# Patient Record
Sex: Female | Born: 1970 | Race: White | Hispanic: No | Marital: Married | Smoking: Former smoker
Health system: Southern US, Community
[De-identification: ages and names within clinical notes are randomized; demographics above are authoritative.]

## PROBLEM LIST (undated history)

## (undated) DIAGNOSIS — J449 Chronic obstructive pulmonary disease, unspecified: Secondary | ICD-10-CM

## (undated) DIAGNOSIS — I499 Cardiac arrhythmia, unspecified: Secondary | ICD-10-CM

## (undated) DIAGNOSIS — M1990 Unspecified osteoarthritis, unspecified site: Secondary | ICD-10-CM

## (undated) DIAGNOSIS — M51369 Other intervertebral disc degeneration, lumbar region without mention of lumbar back pain or lower extremity pain: Secondary | ICD-10-CM

## (undated) DIAGNOSIS — F319 Bipolar disorder, unspecified: Secondary | ICD-10-CM

## (undated) DIAGNOSIS — R9431 Abnormal electrocardiogram [ECG] [EKG]: Secondary | ICD-10-CM

## (undated) DIAGNOSIS — G894 Chronic pain syndrome: Secondary | ICD-10-CM

## (undated) DIAGNOSIS — I10 Essential (primary) hypertension: Secondary | ICD-10-CM

## (undated) DIAGNOSIS — F32A Depression, unspecified: Secondary | ICD-10-CM

## (undated) DIAGNOSIS — F329 Major depressive disorder, single episode, unspecified: Secondary | ICD-10-CM

## (undated) DIAGNOSIS — E669 Obesity, unspecified: Secondary | ICD-10-CM

## (undated) DIAGNOSIS — R7303 Prediabetes: Secondary | ICD-10-CM

## (undated) DIAGNOSIS — Z96659 Presence of unspecified artificial knee joint: Secondary | ICD-10-CM

## (undated) DIAGNOSIS — F419 Anxiety disorder, unspecified: Secondary | ICD-10-CM

## (undated) DIAGNOSIS — E785 Hyperlipidemia, unspecified: Secondary | ICD-10-CM

## (undated) DIAGNOSIS — M7989 Other specified soft tissue disorders: Secondary | ICD-10-CM

## (undated) DIAGNOSIS — G2581 Restless legs syndrome: Secondary | ICD-10-CM

## (undated) DIAGNOSIS — M797 Fibromyalgia: Secondary | ICD-10-CM

## (undated) DIAGNOSIS — G9520 Unspecified cord compression: Secondary | ICD-10-CM

## (undated) DIAGNOSIS — E876 Hypokalemia: Secondary | ICD-10-CM

## (undated) DIAGNOSIS — R0609 Other forms of dyspnea: Secondary | ICD-10-CM

## (undated) DIAGNOSIS — M978XXA Periprosthetic fracture around other internal prosthetic joint, initial encounter: Secondary | ICD-10-CM

## (undated) DIAGNOSIS — J45909 Unspecified asthma, uncomplicated: Secondary | ICD-10-CM

## (undated) DIAGNOSIS — Z96651 Presence of right artificial knee joint: Secondary | ICD-10-CM

## (undated) DIAGNOSIS — M48061 Spinal stenosis, lumbar region without neurogenic claudication: Secondary | ICD-10-CM

## (undated) DIAGNOSIS — M5414 Radiculopathy, thoracic region: Secondary | ICD-10-CM

## (undated) DIAGNOSIS — M5136 Other intervertebral disc degeneration, lumbar region: Secondary | ICD-10-CM

## (undated) DIAGNOSIS — M5116 Intervertebral disc disorders with radiculopathy, lumbar region: Secondary | ICD-10-CM

## (undated) DIAGNOSIS — M5441 Lumbago with sciatica, right side: Secondary | ICD-10-CM

## (undated) DIAGNOSIS — F1290 Cannabis use, unspecified, uncomplicated: Secondary | ICD-10-CM

## (undated) DIAGNOSIS — R0600 Dyspnea, unspecified: Secondary | ICD-10-CM

## (undated) DIAGNOSIS — M5417 Radiculopathy, lumbosacral region: Secondary | ICD-10-CM

## (undated) DIAGNOSIS — E559 Vitamin D deficiency, unspecified: Secondary | ICD-10-CM

## (undated) DIAGNOSIS — F17200 Nicotine dependence, unspecified, uncomplicated: Secondary | ICD-10-CM

## (undated) DIAGNOSIS — R55 Syncope and collapse: Secondary | ICD-10-CM

## (undated) HISTORY — PX: TUBAL LIGATION: SHX77

## (undated) HISTORY — PX: TONSILLECTOMY: SUR1361

## (undated) HISTORY — PX: ABDOMINAL HYSTERECTOMY: SHX81

## (undated) HISTORY — DX: Radiculopathy, thoracic region: M54.14

## (undated) HISTORY — PX: KNEE ARTHROSCOPY: SUR90

## (undated) HISTORY — PX: OTHER SURGICAL HISTORY: SHX169

## (undated) HISTORY — DX: Radiculopathy, lumbosacral region: M54.17

## (undated) HISTORY — DX: Unspecified cord compression: G95.20

## (undated) HISTORY — DX: Syncope and collapse: R55

## (undated) HISTORY — DX: Presence of right artificial knee joint: Z96.651

## (undated) HISTORY — DX: Lumbago with sciatica, right side: M54.41

## (undated) HISTORY — DX: Periprosthetic fracture around other internal prosthetic joint, initial encounter: M97.8XXA

## (undated) HISTORY — DX: Presence of unspecified artificial knee joint: Z96.659

## (undated) HISTORY — DX: Other specified soft tissue disorders: M79.89

## (undated) HISTORY — DX: Intervertebral disc disorders with radiculopathy, lumbar region: M51.16

---

## 2004-07-09 ENCOUNTER — Ambulatory Visit: Payer: Self-pay | Admitting: Obstetrics and Gynecology

## 2005-07-04 ENCOUNTER — Emergency Department: Payer: Self-pay | Admitting: Internal Medicine

## 2006-03-02 ENCOUNTER — Emergency Department: Payer: Self-pay | Admitting: Emergency Medicine

## 2006-08-18 ENCOUNTER — Emergency Department: Payer: Self-pay | Admitting: Internal Medicine

## 2006-10-23 ENCOUNTER — Emergency Department: Payer: Self-pay | Admitting: Emergency Medicine

## 2006-11-25 ENCOUNTER — Emergency Department: Payer: Self-pay | Admitting: Emergency Medicine

## 2006-12-01 ENCOUNTER — Ambulatory Visit: Payer: Self-pay | Admitting: Specialist

## 2006-12-10 ENCOUNTER — Ambulatory Visit: Payer: Self-pay | Admitting: Specialist

## 2006-12-10 ENCOUNTER — Other Ambulatory Visit: Payer: Self-pay

## 2006-12-17 ENCOUNTER — Ambulatory Visit: Payer: Self-pay | Admitting: Specialist

## 2007-08-14 ENCOUNTER — Emergency Department: Payer: Self-pay | Admitting: Emergency Medicine

## 2007-12-01 ENCOUNTER — Ambulatory Visit: Payer: Self-pay | Admitting: Obstetrics and Gynecology

## 2007-12-16 ENCOUNTER — Ambulatory Visit: Payer: Self-pay | Admitting: Family Medicine

## 2007-12-18 ENCOUNTER — Ambulatory Visit: Payer: Self-pay | Admitting: Obstetrics and Gynecology

## 2008-01-06 ENCOUNTER — Emergency Department: Payer: Self-pay | Admitting: Emergency Medicine

## 2008-02-17 ENCOUNTER — Emergency Department: Payer: Self-pay | Admitting: Emergency Medicine

## 2008-03-17 ENCOUNTER — Emergency Department: Payer: Self-pay | Admitting: Unknown Physician Specialty

## 2008-03-21 ENCOUNTER — Emergency Department: Payer: Self-pay | Admitting: Emergency Medicine

## 2008-07-06 ENCOUNTER — Ambulatory Visit: Payer: Self-pay | Admitting: Pain Medicine

## 2008-07-20 ENCOUNTER — Ambulatory Visit: Payer: Self-pay | Admitting: Pain Medicine

## 2008-08-30 ENCOUNTER — Ambulatory Visit: Payer: Self-pay | Admitting: Obstetrics and Gynecology

## 2008-09-05 ENCOUNTER — Ambulatory Visit: Payer: Self-pay | Admitting: Obstetrics and Gynecology

## 2008-09-28 ENCOUNTER — Ambulatory Visit: Payer: Self-pay | Admitting: Pain Medicine

## 2008-12-19 ENCOUNTER — Ambulatory Visit: Payer: Self-pay | Admitting: Family Medicine

## 2009-03-13 ENCOUNTER — Ambulatory Visit: Payer: Self-pay | Admitting: Unknown Physician Specialty

## 2009-03-16 ENCOUNTER — Inpatient Hospital Stay: Payer: Self-pay | Admitting: Unknown Physician Specialty

## 2010-01-18 ENCOUNTER — Ambulatory Visit: Payer: Self-pay | Admitting: Unknown Physician Specialty

## 2010-01-29 ENCOUNTER — Ambulatory Visit: Payer: Self-pay | Admitting: Unknown Physician Specialty

## 2010-02-08 ENCOUNTER — Ambulatory Visit: Payer: Self-pay | Admitting: Unknown Physician Specialty

## 2012-01-24 DIAGNOSIS — M7989 Other specified soft tissue disorders: Secondary | ICD-10-CM | POA: Insufficient documentation

## 2012-01-24 DIAGNOSIS — M799 Soft tissue disorder, unspecified: Secondary | ICD-10-CM

## 2012-01-24 HISTORY — DX: Soft tissue disorder, unspecified: M79.9

## 2012-03-27 ENCOUNTER — Emergency Department: Payer: Self-pay | Admitting: *Deleted

## 2012-05-07 DIAGNOSIS — M5414 Radiculopathy, thoracic region: Secondary | ICD-10-CM

## 2012-05-07 DIAGNOSIS — G9520 Unspecified cord compression: Secondary | ICD-10-CM | POA: Insufficient documentation

## 2012-05-07 DIAGNOSIS — IMO0002 Reserved for concepts with insufficient information to code with codable children: Secondary | ICD-10-CM | POA: Insufficient documentation

## 2012-05-07 HISTORY — DX: Radiculopathy, thoracic region: M54.14

## 2012-05-07 HISTORY — DX: Unspecified cord compression: G95.20

## 2013-05-08 DIAGNOSIS — F329 Major depressive disorder, single episode, unspecified: Secondary | ICD-10-CM | POA: Insufficient documentation

## 2013-12-30 DIAGNOSIS — M961 Postlaminectomy syndrome, not elsewhere classified: Secondary | ICD-10-CM | POA: Insufficient documentation

## 2013-12-30 DIAGNOSIS — M5412 Radiculopathy, cervical region: Secondary | ICD-10-CM | POA: Insufficient documentation

## 2013-12-30 DIAGNOSIS — G894 Chronic pain syndrome: Secondary | ICD-10-CM | POA: Insufficient documentation

## 2014-03-26 DIAGNOSIS — J45909 Unspecified asthma, uncomplicated: Secondary | ICD-10-CM | POA: Insufficient documentation

## 2014-03-26 DIAGNOSIS — E785 Hyperlipidemia, unspecified: Secondary | ICD-10-CM | POA: Insufficient documentation

## 2014-03-26 DIAGNOSIS — I459 Conduction disorder, unspecified: Secondary | ICD-10-CM | POA: Insufficient documentation

## 2014-03-26 DIAGNOSIS — E7800 Pure hypercholesterolemia, unspecified: Secondary | ICD-10-CM | POA: Insufficient documentation

## 2014-04-08 DIAGNOSIS — R55 Syncope and collapse: Secondary | ICD-10-CM

## 2014-04-08 DIAGNOSIS — I493 Ventricular premature depolarization: Secondary | ICD-10-CM | POA: Insufficient documentation

## 2014-04-08 HISTORY — DX: Syncope and collapse: R55

## 2014-04-20 DIAGNOSIS — R0681 Apnea, not elsewhere classified: Secondary | ICD-10-CM

## 2014-04-20 HISTORY — DX: Apnea, not elsewhere classified: R06.81

## 2014-04-25 DIAGNOSIS — Z87898 Personal history of other specified conditions: Secondary | ICD-10-CM | POA: Insufficient documentation

## 2014-04-25 DIAGNOSIS — F329 Major depressive disorder, single episode, unspecified: Secondary | ICD-10-CM | POA: Insufficient documentation

## 2014-04-25 DIAGNOSIS — E785 Hyperlipidemia, unspecified: Secondary | ICD-10-CM | POA: Insufficient documentation

## 2014-04-25 DIAGNOSIS — F419 Anxiety disorder, unspecified: Secondary | ICD-10-CM

## 2014-04-25 DIAGNOSIS — M545 Low back pain: Secondary | ICD-10-CM

## 2014-04-25 DIAGNOSIS — Z8768 Personal history of other (corrected) conditions arising in the perinatal period: Secondary | ICD-10-CM | POA: Insufficient documentation

## 2014-04-25 DIAGNOSIS — M797 Fibromyalgia: Secondary | ICD-10-CM | POA: Insufficient documentation

## 2014-04-25 DIAGNOSIS — G8929 Other chronic pain: Secondary | ICD-10-CM | POA: Insufficient documentation

## 2014-04-25 DIAGNOSIS — E559 Vitamin D deficiency, unspecified: Secondary | ICD-10-CM | POA: Insufficient documentation

## 2014-04-25 HISTORY — DX: Personal history of other (corrected) conditions arising in the perinatal period: Z87.68

## 2014-04-25 HISTORY — DX: Personal history of other specified conditions: Z87.898

## 2014-05-23 DIAGNOSIS — M5116 Intervertebral disc disorders with radiculopathy, lumbar region: Secondary | ICD-10-CM

## 2014-05-23 DIAGNOSIS — M48061 Spinal stenosis, lumbar region without neurogenic claudication: Secondary | ICD-10-CM | POA: Insufficient documentation

## 2014-05-23 HISTORY — DX: Intervertebral disc disorders with radiculopathy, lumbar region: M51.16

## 2014-06-08 ENCOUNTER — Encounter: Payer: Self-pay | Admitting: Family Medicine

## 2014-08-18 ENCOUNTER — Encounter: Admit: 2014-08-18 | Disposition: A | Discharge status: Home / Self Care | Payer: Self-pay

## 2014-08-19 ENCOUNTER — Encounter: Admit: 2014-08-19 | Disposition: A | Discharge status: Home / Self Care | Payer: Self-pay

## 2014-08-30 DIAGNOSIS — M5441 Lumbago with sciatica, right side: Secondary | ICD-10-CM

## 2014-08-30 DIAGNOSIS — M5136 Other intervertebral disc degeneration, lumbar region: Secondary | ICD-10-CM | POA: Insufficient documentation

## 2014-08-30 HISTORY — DX: Lumbago with sciatica, right side: M54.41

## 2014-11-17 DIAGNOSIS — M159 Polyosteoarthritis, unspecified: Secondary | ICD-10-CM | POA: Insufficient documentation

## 2014-11-17 DIAGNOSIS — M1712 Unilateral primary osteoarthritis, left knee: Secondary | ICD-10-CM | POA: Insufficient documentation

## 2015-01-24 ENCOUNTER — Encounter
Admission: RE | Admit: 2015-01-24 | Discharge: 2015-01-24 | Disposition: A | Discharge status: Home / Self Care | Payer: No Typology Code available for payment source | Source: Ambulatory Visit | Attending: Unknown Physician Specialty | Admitting: Unknown Physician Specialty

## 2015-01-24 DIAGNOSIS — Z01810 Encounter for preprocedural cardiovascular examination: Secondary | ICD-10-CM | POA: Insufficient documentation

## 2015-01-24 DIAGNOSIS — I10 Essential (primary) hypertension: Secondary | ICD-10-CM | POA: Diagnosis not present

## 2015-01-24 DIAGNOSIS — Z01812 Encounter for preprocedural laboratory examination: Secondary | ICD-10-CM | POA: Diagnosis present

## 2015-01-24 HISTORY — DX: Unspecified asthma, uncomplicated: J45.909

## 2015-01-24 HISTORY — DX: Other intervertebral disc degeneration, lumbar region: M51.36

## 2015-01-24 HISTORY — DX: Depression, unspecified: F32.A

## 2015-01-24 HISTORY — DX: Unspecified osteoarthritis, unspecified site: M19.90

## 2015-01-24 HISTORY — DX: Essential (primary) hypertension: I10

## 2015-01-24 HISTORY — DX: Fibromyalgia: M79.7

## 2015-01-24 HISTORY — DX: Anxiety disorder, unspecified: F41.9

## 2015-01-24 HISTORY — DX: Other intervertebral disc degeneration, lumbar region without mention of lumbar back pain or lower extremity pain: M51.369

## 2015-01-24 HISTORY — DX: Restless legs syndrome: G25.81

## 2015-01-24 HISTORY — DX: Major depressive disorder, single episode, unspecified: F32.9

## 2015-01-24 HISTORY — DX: Bipolar disorder, unspecified: F31.9

## 2015-01-24 LAB — PROTIME-INR
INR: 0.92
Prothrombin Time: 12.6 seconds (ref 11.4–15.0)

## 2015-01-24 LAB — POTASSIUM: Potassium: 4 mmol/L (ref 3.5–5.1)

## 2015-01-24 LAB — ABO/RH: ABO/RH(D): AB POS

## 2015-01-24 LAB — APTT: APTT: 29 s (ref 24–36)

## 2015-01-24 LAB — SURGICAL PCR SCREEN
MRSA, PCR: NEGATIVE
STAPHYLOCOCCUS AUREUS: POSITIVE — AB

## 2015-01-24 NOTE — Pre-Procedure Instructions (Signed)
Positive staph results called and faxed to [facility], [place]

## 2015-01-24 NOTE — Patient Instructions (Signed)
  Your procedure is scheduled on: February 01, 2015 (Wednesdday) Report to Day Surgery.[facility]) To find out your arrival time please call [phone] between 1PM - 3PM on January 31, 2015 (Tuesday).  Remember: Instructions that are not followed completely Zacarias result in serious medical risk, up to and including death, or upon the discretion of your surgeon and anesthesiologist your surgery Heffner need to be rescheduled.    __x__ 1. Do not eat food or drink liquids after midnight. No gum chewing or hard candies.     ____ 2. No Alcohol for 24 hours before or after surgery.   ____ 3. Bring all medications with you on the day of surgery if instructed.    __x__ 4. Notify your doctor if there is any change in your medical condition     (cold, fever, infections).     Do not wear jewelry, make-up, hairpins, clips or nail polish.  Do not wear lotions, powders, or perfumes. You Briles wear deodorant.  Do not shave 48 hours prior to surgery. Men Nand shave face and neck.  Do not bring valuables to the hospital.    [facility] is not responsible for any belongings or valuables.               Contacts, dentures or bridgework Felty not be worn into surgery.  Leave your suitcase in the car. After surgery it Stroot be brought to your room.  For patients admitted to the hospital, discharge time is determined by your                treatment team.   Patients discharged the day of surgery will not be allowed to drive home.   Please read over the following fact sheets that you were given:   MRSA Information and Surgical Site Infection Prevention   ____ Take these medicines the morning of surgery with A SIP OF WATER:    1. Gabapentin  2. Venlafaxine XR  3.   4.  5.  6.  ____ Fleet Enema (as directed)   _x___ Use CHG Soap as directed  ____ Use inhalers on the day of surgery  ____ Stop metformin 2 days prior to surgery    ____ Take 1/2 of usual insulin dose the night before surgery and none on  the morning of surgery.   ____ Stop Coumadin/Plavix/aspirin on   _x___ Stop Anti-inflammatories on (Stop Diclofenac now)   ____ Stop supplements until after surgery.    ____ Bring C-Pap to the hospital.

## 2015-02-01 ENCOUNTER — Encounter: Payer: Self-pay | Admitting: *Deleted

## 2015-02-01 ENCOUNTER — Inpatient Hospital Stay: Payer: No Typology Code available for payment source | Admitting: Anesthesiology

## 2015-02-01 ENCOUNTER — Inpatient Hospital Stay: Payer: No Typology Code available for payment source

## 2015-02-01 ENCOUNTER — Encounter
Admission: RE | Disposition: A | Discharge status: Home Health | Payer: Self-pay | Source: Ambulatory Visit | Attending: Unknown Physician Specialty

## 2015-02-01 ENCOUNTER — Inpatient Hospital Stay
Admission: RE | Admit: 2015-02-01 | Discharge: 2015-02-03 | DRG: 470 | Disposition: A | Discharge status: Home Health | Payer: No Typology Code available for payment source | Source: Ambulatory Visit | Attending: Unknown Physician Specialty | Admitting: Unknown Physician Specialty

## 2015-02-01 DIAGNOSIS — J45909 Unspecified asthma, uncomplicated: Secondary | ICD-10-CM | POA: Diagnosis present

## 2015-02-01 DIAGNOSIS — G8918 Other acute postprocedural pain: Secondary | ICD-10-CM

## 2015-02-01 DIAGNOSIS — Z96651 Presence of right artificial knee joint: Secondary | ICD-10-CM

## 2015-02-01 DIAGNOSIS — Z87891 Personal history of nicotine dependence: Secondary | ICD-10-CM | POA: Diagnosis not present

## 2015-02-01 DIAGNOSIS — I10 Essential (primary) hypertension: Secondary | ICD-10-CM | POA: Diagnosis present

## 2015-02-01 DIAGNOSIS — Z6841 Body Mass Index (BMI) 40.0 and over, adult: Secondary | ICD-10-CM | POA: Diagnosis not present

## 2015-02-01 DIAGNOSIS — F419 Anxiety disorder, unspecified: Secondary | ICD-10-CM | POA: Diagnosis present

## 2015-02-01 DIAGNOSIS — M5136 Other intervertebral disc degeneration, lumbar region: Secondary | ICD-10-CM | POA: Diagnosis present

## 2015-02-01 DIAGNOSIS — M179 Osteoarthritis of knee, unspecified: Secondary | ICD-10-CM | POA: Diagnosis present

## 2015-02-01 DIAGNOSIS — M797 Fibromyalgia: Secondary | ICD-10-CM | POA: Diagnosis present

## 2015-02-01 DIAGNOSIS — G2581 Restless legs syndrome: Secondary | ICD-10-CM | POA: Diagnosis present

## 2015-02-01 DIAGNOSIS — F319 Bipolar disorder, unspecified: Secondary | ICD-10-CM | POA: Diagnosis present

## 2015-02-01 HISTORY — DX: Presence of right artificial knee joint: Z96.651

## 2015-02-01 HISTORY — PX: TOTAL KNEE ARTHROPLASTY: SHX125

## 2015-02-01 LAB — CREATININE, SERUM
CREATININE: 1.06 mg/dL — AB (ref 0.44–1.00)
GFR calc Af Amer: >60 mL/min (ref >60)
GFR calc non Af Amer: >60 mL/min (ref >60)

## 2015-02-01 LAB — CBC
HCT: 37.5 % (ref 35.0–47.0)
Hemoglobin: 12.4 g/dL (ref 12.0–16.0)
MCH: 28.2 pg (ref 26.0–34.0)
MCHC: 33 g/dL (ref 32.0–36.0)
MCV: 85.6 fL (ref 80.0–100.0)
PLATELETS: 238 10*3/uL (ref 150–440)
RBC: 4.38 MIL/uL (ref 3.80–5.20)
RDW: 13.6 % (ref 11.5–14.5)
WBC: 17.5 10*3/uL — ABNORMAL HIGH (ref 3.6–11.0)

## 2015-02-01 LAB — PREPARE RBC (CROSSMATCH)

## 2015-02-01 SURGERY — ARTHROPLASTY, KNEE, TOTAL
Anesthesia: Spinal | Laterality: Right

## 2015-02-01 MED ORDER — BUPIVACAINE LIPOSOME 1.3 % IJ SUSP
INTRAMUSCULAR | Status: DC | PRN
  Administered 2015-02-01: 60 mL

## 2015-02-01 MED ORDER — BUPIVACAINE-EPINEPHRINE (PF) 0.5% -1:200000 IJ SOLN
INTRAMUSCULAR | Status: AC
  Filled 2015-02-01: qty 30

## 2015-02-01 MED ORDER — CEFAZOLIN SODIUM-DEXTROSE 2-3 GM-% IV SOLR
2.0000 g | Freq: Once | INTRAVENOUS | Status: AC
  Administered 2015-02-01: 2 g via INTRAVENOUS

## 2015-02-01 MED ORDER — POLYETHYLENE GLYCOL 3350 17 G PO PACK
17.0000 g | PACK | Freq: Every day | ORAL | Status: DC | PRN
  Administered 2015-02-02 – 2015-02-03 (×2): 17 g via ORAL
  Filled 2015-02-01 (×2): qty 1

## 2015-02-01 MED ORDER — BUPIVACAINE LIPOSOME 1.3 % IJ SUSP
INTRAMUSCULAR | Status: AC
  Filled 2015-02-01: qty 20

## 2015-02-01 MED ORDER — BUPIVACAINE-EPINEPHRINE (PF) 0.5% -1:200000 IJ SOLN
INTRAMUSCULAR | Status: DC | PRN
  Administered 2015-02-01: 30 mL

## 2015-02-01 MED ORDER — ACETAMINOPHEN 10 MG/ML IV SOLN
INTRAVENOUS | Status: AC
  Filled 2015-02-01: qty 100

## 2015-02-01 MED ORDER — PROPOFOL INFUSION 10 MG/ML OPTIME
INTRAVENOUS | Status: DC | PRN
  Administered 2015-02-01: 75 ug/kg/min via INTRAVENOUS

## 2015-02-01 MED ORDER — LACTATED RINGERS IV SOLN
INTRAVENOUS | Status: DC
  Administered 2015-02-01 (×2): via INTRAVENOUS

## 2015-02-01 MED ORDER — CEFAZOLIN SODIUM 1-5 GM-% IV SOLN
INTRAVENOUS | Status: DC | PRN
  Administered 2015-02-01: 1 g via INTRAVENOUS

## 2015-02-01 MED ORDER — HYDRALAZINE HCL 20 MG/ML IJ SOLN
INTRAMUSCULAR | Status: DC | PRN
  Administered 2015-02-01 (×2): 5 mg via INTRAVENOUS

## 2015-02-01 MED ORDER — NEOMYCIN-POLYMYXIN B GU 40-200000 IR SOLN
Status: DC | PRN
  Administered 2015-02-01: 16 mL

## 2015-02-01 MED ORDER — ACETAMINOPHEN 325 MG PO TABS: 650.0000 mg | ORAL_TABLET | Freq: Four times a day (QID) | ORAL | Status: DC | PRN

## 2015-02-01 MED ORDER — KCL IN DEXTROSE-NACL 20-5-0.45 MEQ/L-%-% IV SOLN
INTRAVENOUS | Status: DC
  Administered 2015-02-01 – 2015-02-02 (×2): via INTRAVENOUS
  Filled 2015-02-01 (×7): qty 1000

## 2015-02-01 MED ORDER — MIDAZOLAM HCL 2 MG/2ML IJ SOLN
INTRAMUSCULAR | Status: DC | PRN
  Administered 2015-02-01: 2 mg via INTRAVENOUS

## 2015-02-01 MED ORDER — TRANEXAMIC ACID 1000 MG/10ML IV SOLN
INTRAVENOUS | Status: AC
  Filled 2015-02-01: qty 10

## 2015-02-01 MED ORDER — HYDROCHLOROTHIAZIDE 12.5 MG PO CAPS
12.5000 mg | ORAL_CAPSULE | Freq: Every day | ORAL | Status: DC
  Administered 2015-02-02 – 2015-02-03 (×2): 12.5 mg via ORAL
  Filled 2015-02-01 (×2): qty 1

## 2015-02-01 MED ORDER — PHENOL 1.4 % MT LIQD: 1.0000 | OROMUCOSAL | Status: DC | PRN

## 2015-02-01 MED ORDER — SODIUM CHLORIDE 0.9 % IJ SOLN
INTRAMUSCULAR | Status: AC
  Filled 2015-02-01: qty 50

## 2015-02-01 MED ORDER — ACETAMINOPHEN 10 MG/ML IV SOLN
INTRAVENOUS | Status: DC | PRN
  Administered 2015-02-01: 1000 mg via INTRAVENOUS

## 2015-02-01 MED ORDER — GABAPENTIN 600 MG PO TABS
900.0000 mg | ORAL_TABLET | Freq: Two times a day (BID) | ORAL | Status: DC
Start: 2015-02-01 — End: 2015-02-03
  Administered 2015-02-01 – 2015-02-03 (×4): 900 mg via ORAL
  Filled 2015-02-01: qty 1
  Filled 2015-02-01: qty 2
  Filled 2015-02-01: qty 1
  Filled 2015-02-01 (×2): qty 2
  Filled 2015-02-01: qty 1

## 2015-02-01 MED ORDER — EPHEDRINE SULFATE 50 MG/ML IJ SOLN
INTRAMUSCULAR | Status: DC | PRN
  Administered 2015-02-01 (×2): 10 mg via INTRAVENOUS

## 2015-02-01 MED ORDER — HYDROMORPHONE HCL 1 MG/ML IJ SOLN
2.0000 mg | INTRAMUSCULAR | Status: DC | PRN
  Administered 2015-02-01 – 2015-02-02 (×6): 2 mg via INTRAVENOUS
  Filled 2015-02-01 (×6): qty 2

## 2015-02-01 MED ORDER — ACETAMINOPHEN 650 MG RE SUPP: 650.0000 mg | Freq: Four times a day (QID) | RECTAL | Status: DC | PRN

## 2015-02-01 MED ORDER — VENLAFAXINE HCL ER 75 MG PO CP24
225.0000 mg | ORAL_CAPSULE | Freq: Every day | ORAL | Status: DC
  Administered 2015-02-02 – 2015-02-03 (×2): 225 mg via ORAL
  Filled 2015-02-01 (×2): qty 3

## 2015-02-01 MED ORDER — OXYCODONE HCL 5 MG PO TABS
5.0000 mg | ORAL_TABLET | ORAL | Status: DC | PRN
  Administered 2015-02-01: 10 mg via ORAL
  Administered 2015-02-01 (×2): 5 mg via ORAL
  Administered 2015-02-02 – 2015-02-03 (×8): 10 mg via ORAL
  Filled 2015-02-01 (×4): qty 2
  Filled 2015-02-01: qty 1
  Filled 2015-02-01: qty 2
  Filled 2015-02-01: qty 1
  Filled 2015-02-01 (×4): qty 2

## 2015-02-01 MED ORDER — FENTANYL CITRATE (PF) 100 MCG/2ML IJ SOLN
INTRAMUSCULAR | Status: AC
  Filled 2015-02-01: qty 2

## 2015-02-01 MED ORDER — LABETALOL HCL 5 MG/ML IV SOLN
INTRAVENOUS | Status: DC | PRN
  Administered 2015-02-01: 5 mg via INTRAVENOUS

## 2015-02-01 MED ORDER — LIDOCAINE HCL (CARDIAC) 20 MG/ML IV SOLN
INTRAVENOUS | Status: DC | PRN
  Administered 2015-02-01: 80 mg via INTRAVENOUS

## 2015-02-01 MED ORDER — ARIPIPRAZOLE 5 MG PO TABS
10.0000 mg | ORAL_TABLET | Freq: Every day | ORAL | Status: DC
Start: 2015-02-01 — End: 2015-02-03
  Administered 2015-02-01 – 2015-02-03 (×3): 10 mg via ORAL
  Filled 2015-02-01 (×3): qty 2

## 2015-02-01 MED ORDER — CEFAZOLIN SODIUM 1-5 GM-% IV SOLN
1.0000 g | Freq: Three times a day (TID) | INTRAVENOUS | Status: AC
  Administered 2015-02-01 – 2015-02-02 (×4): 1 g via INTRAVENOUS
  Filled 2015-02-01 (×3): qty 50

## 2015-02-01 MED ORDER — ONDANSETRON HCL 4 MG/2ML IJ SOLN: 4.0000 mg | Freq: Once | INTRAMUSCULAR | Status: DC | PRN

## 2015-02-01 MED ORDER — VITAMIN D 1000 UNITS PO TABS
1000.0000 [IU] | ORAL_TABLET | Freq: Every day | ORAL | Status: DC
  Administered 2015-02-02 – 2015-02-03 (×2): 1000 [IU] via ORAL
  Filled 2015-02-01 (×2): qty 1

## 2015-02-01 MED ORDER — MENTHOL 3 MG MT LOZG: 1.0000 | LOZENGE | OROMUCOSAL | Status: DC | PRN

## 2015-02-01 MED ORDER — FAMOTIDINE 20 MG PO TABS
ORAL_TABLET | ORAL | Status: AC
  Administered 2015-02-01: 20 mg via ORAL
  Filled 2015-02-01: qty 1

## 2015-02-01 MED ORDER — ONDANSETRON HCL 4 MG PO TABS: 4.0000 mg | ORAL_TABLET | Freq: Four times a day (QID) | ORAL | Status: DC | PRN

## 2015-02-01 MED ORDER — ENOXAPARIN SODIUM 30 MG/0.3ML ~~LOC~~ SOLN: 30.0000 mg | Freq: Two times a day (BID) | SUBCUTANEOUS | Status: DC

## 2015-02-01 MED ORDER — HYDROXYZINE HCL 50 MG PO TABS
50.0000 mg | ORAL_TABLET | Freq: Three times a day (TID) | ORAL | Status: DC
  Administered 2015-02-01 – 2015-02-03 (×6): 50 mg via ORAL
  Filled 2015-02-01 (×7): qty 1

## 2015-02-01 MED ORDER — TRANEXAMIC ACID 1000 MG/10ML IV SOLN
1000.0000 mg | INTRAVENOUS | Status: DC | PRN
  Administered 2015-02-01: 1000 mg via TOPICAL

## 2015-02-01 MED ORDER — FAMOTIDINE 20 MG PO TABS
20.0000 mg | ORAL_TABLET | Freq: Once | ORAL | Status: AC
  Administered 2015-02-01: 20 mg via ORAL

## 2015-02-01 MED ORDER — NEOMYCIN-POLYMYXIN B GU 40-200000 IR SOLN
Status: AC
  Filled 2015-02-01: qty 20

## 2015-02-01 MED ORDER — FENTANYL CITRATE (PF) 100 MCG/2ML IJ SOLN
25.0000 ug | INTRAMUSCULAR | Status: DC | PRN
  Administered 2015-02-01 (×5): 25 ug via INTRAVENOUS

## 2015-02-01 MED ORDER — SIMVASTATIN 20 MG PO TABS
20.0000 mg | ORAL_TABLET | Freq: Every day | ORAL | Status: DC
  Administered 2015-02-02: 20 mg via ORAL
  Filled 2015-02-01: qty 1

## 2015-02-01 MED ORDER — TIZANIDINE HCL 4 MG PO TABS
4.0000 mg | ORAL_TABLET | Freq: Three times a day (TID) | ORAL | Status: DC
  Administered 2015-02-01 – 2015-02-03 (×5): 4 mg via ORAL
  Filled 2015-02-01 (×5): qty 1

## 2015-02-01 MED ORDER — FENTANYL CITRATE (PF) 100 MCG/2ML IJ SOLN
INTRAMUSCULAR | Status: AC
  Administered 2015-02-01: 25 ug via INTRAVENOUS
  Filled 2015-02-01: qty 2

## 2015-02-01 MED ORDER — FENTANYL CITRATE (PF) 100 MCG/2ML IJ SOLN
INTRAMUSCULAR | Status: DC | PRN
  Administered 2015-02-01 (×2): 50 ug via INTRAVENOUS

## 2015-02-01 MED ORDER — CEFAZOLIN SODIUM-DEXTROSE 2-3 GM-% IV SOLR
INTRAVENOUS | Status: AC
  Filled 2015-02-01: qty 50

## 2015-02-01 MED ORDER — ONDANSETRON HCL 4 MG/2ML IJ SOLN
4.0000 mg | Freq: Four times a day (QID) | INTRAMUSCULAR | Status: DC | PRN
  Administered 2015-02-02: 4 mg via INTRAVENOUS
  Filled 2015-02-01 (×2): qty 2

## 2015-02-01 SURGICAL SUPPLY — 65 items
BEARIN INSERT TIBIAL SZ4 9 (Orthopedic Implant) IMPLANT
BEARIN INSERT TIBIAL SZ4 9MM (Orthopedic Implant) IMPLANT
BEARIN TIBIAL TRIATH (Orthopedic Implant) IMPLANT
BEARING INSERT TIBIAL SZ4 9 (Orthopedic Implant) IMPLANT
BEARING TIBIAL TRIATH (Orthopedic Implant) IMPLANT
BLADE SAGITTAL AGGR TOOTH XLG (BLADE) ×3 IMPLANT
BLADE SAW 1/2 (BLADE) ×3 IMPLANT
BLADE SAW SAG 29X58X.64 (BLADE) ×3 IMPLANT
BLADE SURG 15 STRL LF DISP TIS (BLADE) IMPLANT
BLADE SURG 15 STRL SS (BLADE)
BNDG COHESIVE 6X5 TAN STRL LF (GAUZE/BANDAGES/DRESSINGS) ×3 IMPLANT
BOWL CEMENT MIXING ADV NOZZLE (MISCELLANEOUS) IMPLANT
CANISTER SUCT 1200ML W/VALVE (MISCELLANEOUS) ×3 IMPLANT
CANISTER SUCT 3000ML (MISCELLANEOUS) ×6 IMPLANT
CAPT KNEE TRIATH TK-4 ×3 IMPLANT
CATH TRAY METER 16FR LF (MISCELLANEOUS) ×3 IMPLANT
CHLORAPREP W/TINT 26ML (MISCELLANEOUS) ×9 IMPLANT
COOLER POLAR GLACIER W/PUMP (MISCELLANEOUS) ×3 IMPLANT
DRAPE INCISE IOBAN 66X45 STRL (DRAPES) ×3 IMPLANT
DRAPE SHEET LG 3/4 BI-LAMINATE (DRAPES) ×6 IMPLANT
DRAPE TABLE BACK 80X90 (DRAPES) ×3 IMPLANT
DRSG AQUACEL AG ADV 3.5X10 (GAUZE/BANDAGES/DRESSINGS) IMPLANT
DRSG OPSITE POSTOP 4X10 (GAUZE/BANDAGES/DRESSINGS) ×3 IMPLANT
GAUZE PETRO XEROFOAM 1X8 (MISCELLANEOUS) ×3 IMPLANT
GAUZE SPONGE 4X4 12PLY STRL (GAUZE/BANDAGES/DRESSINGS) IMPLANT
GLOVE BIO SURGEON STRL SZ7.5 (GLOVE) ×6 IMPLANT
GLOVE BIO SURGEON STRL SZ8 (GLOVE) ×12 IMPLANT
GLOVE BIOGEL M STRL SZ7.5 (GLOVE) ×3 IMPLANT
GLOVE INDICATOR 8.0 STRL GRN (GLOVE) ×3 IMPLANT
GOWN STRL REUS W/ TWL LRG LVL3 (GOWN DISPOSABLE) ×2 IMPLANT
GOWN STRL REUS W/TWL LRG LVL3 (GOWN DISPOSABLE) ×4
GOWN STRL REUS W/TWL LRG LVL4 (GOWN DISPOSABLE) ×6 IMPLANT
HANDPIECE SUCTION TUBG SURGILV (MISCELLANEOUS) IMPLANT
HOOD PEEL AWAY FACE SHEILD DIS (HOOD) ×6 IMPLANT
IRRIGATION STRYKERFLOW (MISCELLANEOUS) ×1 IMPLANT
IRRIGATOR STRYKERFLOW (MISCELLANEOUS) ×3
KIT RM TURNOVER STRD PROC AR (KITS) ×3 IMPLANT
NDL SAFETY 18GX1.5 (NEEDLE) ×3 IMPLANT
NDL SAFETY 22GX1.5 (NEEDLE) IMPLANT
NEEDLE SPNL 18GX3.5 QUINCKE PK (NEEDLE) ×3 IMPLANT
NEEDLE SPNL 20GX3.5 QUINCKE YW (NEEDLE) ×6 IMPLANT
NS IRRIG 1000ML POUR BTL (IV SOLUTION) ×3 IMPLANT
PACK TOTAL KNEE (MISCELLANEOUS) ×3 IMPLANT
PAD ABD DERMACEA PRESS 5X9 (GAUZE/BANDAGES/DRESSINGS) IMPLANT
PAD GROUND ADULT SPLIT (MISCELLANEOUS) ×3 IMPLANT
PAD WRAPON POLAR KNEE (MISCELLANEOUS) ×1 IMPLANT
SOL .9 NS 3000ML IRR  AL (IV SOLUTION) ×2
SOL .9 NS 3000ML IRR UROMATIC (IV SOLUTION) ×1 IMPLANT
SOL PREP PVP 2OZ (MISCELLANEOUS) ×3
SOLUTION PREP PVP 2OZ (MISCELLANEOUS) ×1 IMPLANT
STAPLER SKIN PROX 35W (STAPLE) ×3 IMPLANT
SUCTION FRAZIER TIP 10 FR DISP (SUCTIONS) ×3 IMPLANT
SUT ETHIBOND CT1 BRD #0 30IN (SUTURE) ×3 IMPLANT
SUT ETHIBOND NAB CT1 #1 30IN (SUTURE) ×6 IMPLANT
SUT VIC AB 0 CT1 36 (SUTURE) ×9 IMPLANT
SUT VIC AB 2-0 CT1 27 (SUTURE) ×4
SUT VIC AB 2-0 CT1 TAPERPNT 27 (SUTURE) ×2 IMPLANT
SYR 20CC LL (SYRINGE) ×3 IMPLANT
SYR 30ML LL (SYRINGE) ×3 IMPLANT
SYR 50ML LL SCALE MARK (SYRINGE) ×3 IMPLANT
SYRINGE 10CC LL (SYRINGE) ×3 IMPLANT
Triathion X3 tibiaal bearing insert PS (Knees) IMPLANT
WATER STERILE IRR 1000ML POUR (IV SOLUTION) ×3 IMPLANT
WRAPON POLAR PAD KNEE (MISCELLANEOUS) ×3
triathlon X3 tibial bearing insert (Knees) IMPLANT

## 2015-02-01 NOTE — Op Note (Signed)
DATE OF SURGERY:  02/01/2015  PATIENT NAME:  Shelly Wright   DOB: 08/15/1970  MRN: [id number]  PRE-OPERATIVE DIAGNOSIS: Degenerative arthrosis of the right knee  POST-OPERATIVE DIAGNOSIS:  Degenerative arthrosis of the right knee  PROCEDURE:  Right total knee arthroplasty  SURGEON:  Dr.Jaryd Drew Royann Shivers. M.D.  ASSISTANT: Dedra Skeens, PA-C     ANESTHESIA: Spinal  ESTIMATED BLOOD LOSS: 400 mL  TOURNIQUET TIME: 116 minutes   IMPLANTS UTILIZED: Triathlon #5 PS femoral component, #4 tibial component, #4 11 mm thick tibial bearing insert, 10 mm thick asymmetric patella  INDICATIONS FOR SURGERY: Shelly Wright is a 44 y.o. year old female with a long history of progressive right knee pain. X-rays demonstrated severe degenerative changes in tricompartmental fashion. The patient had not seen any significant improvement despite conservative nonsurgical intervention. After discussion of the risks and benefits of surgical intervention, the patient expressed understanding of the risks benefits and agree with plans for total knee arthroplasty.   The risks, benefits, and alternatives were discussed at length including but not limited to the risks of infection, bleeding, nerve injury, stiffness, blood clots, the need for revision surgery, cardiopulmonary complications, among others, and they were willing to proceed.  PROCEDURE:   The patient was taken to the operating room where satisfactory spinal anesthesia was achieved. A Foley catheter was inserted. The patient was given 2 g of IV Kefzol prior to start of the procedure. A tourniquet was applied to right upper thigh. The right lower extremity was prepped and draped in the usual fashion for a total knee procedure. The right lower extremity was then exsanguinated, and the tourniquet was inflated. A medial parapatella incision was made.. Dissection was carried down through the subcutaneous tissue onto the capsule. This was divided in line with the  incision. The knee was then inspected. There was significant erosion of all 3 compartments with significant peripheral osteophytes about the distal femur.  I went ahead and drilled a hole in the intercondylar notch. Intramedullary rod was inserted. On the rod was the distal femoral cutting block. It was set for  5 degree valgus cut. The cutting block was fixed to the distal femur with smooth pins and then the intramedullary road was removed. The distal femoral cut was made, removing 12 mm of bone. I then went ahead and removed some medial and lateral compartment meniscal remnants. ACL and PCL were excised. The knee was hyperflexed. The femoral sizing guide was placed on the distal femur and was lined up with the epicondylar axis. It was felt that a #5 femoral component was going to be the appropriate size. I went ahead and impacted the 4 in 1 cutting guide onto the distal femur. Anterior and posterior cuts were made followed by the anterior and posterior chamfer cuts. The intercondylar area was notched out using the appropriate guide.   External alignment jig was placed on the right leg and set for a cut 9 mm below the medial tibial plateau. The cut was sloped 3 degrees. I went ahead and made this cut without difficulty. I removed the cut bone from the proximal tibial which gave me a flush cut across the tibial surface. I then inserted a  gap spacer with the knee extended, and indeed it was felt that I could easily fit a  9 mm tibial bearing insert into the extension space.  I then sized the proximal tibia for a  #4 base plate. With the #5 femoral component in place and the #4  base plate in place, I was able to insert a 9 mm trial tibial-bearing insert. With all the trials in place, the knee fully extended and flexed well.. I then  resected about 10 mm of bone from retropatellar surface. I made peg holes for the 10 mm patellar implant. Trial patella was inserted. It tracked well.   The trials were removed. The  tibial base plate was positioned appropriately for the proximal tibial cruciate cut. The proximal tibial cruciate cut ws made Next, I went ahead and drilled 2 holes through the femoral trial for the pegs on the femoral component.    I then sequentially impacted the components. The #4 porous-coated  tibial base plate was impacted onto the proximal tibia. Excess glue ws removed. I then impacted the #5 porous-coated femoral component onto the distal.  I then inserted a 9 mm polyethylene spacer. The knee was extended.  I went ahead and applied an 10 mm mm thick porous-coated asymmetric patella to the retropatellar surface. removed.  The knee came to full extension and flexed well. The knee seemed to be somewhat unstable in flexion, however. Consequently I went ahead and removed the 9 mm polyethylene tibial bearing insert and inserted an 11 mm #4 tibial bearing insert onto the tibial baseplate which gave the knee more stability.   Tourniquet was released at this time.  Tourniquet was up for about 116 minutes.  Bleeding was controlled with coagulation cautery. The wound was irrigated with GU irrigant.  I also injected the capsule and subcutaneous tissue with about 50 cc of Exparel and saline mixture. I also injected the same tissues with about 50 cc of 0.25% Marcaine. I went ahead and closed the capsule with #0 ethibond sutures. I then injected 10 cc of TXA through the capsule into the knee joint. Subcutaneous tissue was closed with with  2-0 Vicryl, and skin with skin staples. Subsequent to the closure, I did go ahead and apply a sterile dressing and 4 TENS pads. Polar Care cooling pad was applied.  The patient was then transferred to a stretcher bed and taken to the recovery room in satisfactory condition. Blood loss was about 400 cc.        Dr.Wacey Zieger Royann Shivers. M.D.

## 2015-02-01 NOTE — Transfer of Care (Signed)
Immediate Anesthesia Transfer of Care Note  Patient: Shelly Wright  Procedure(s) Performed: Procedure(s): TOTAL KNEE ARTHROPLASTY (Right)  Patient Location: PACU  Anesthesia Type:Spinal  Level of Consciousness: awake, alert  and oriented  Airway & Oxygen Therapy: Patient Spontanous Breathing and Patient connected to face mask oxygen  Post-op Assessment: Report given to RN and Post -op Vital signs reviewed and stable  Post vital signs: Reviewed and stable  Last Vitals:  Filed Vitals:   02/01/15 1202  BP: 113/61  Pulse: 66  Temp: 36.5 C  Resp: 12    Complications: No apparent anesthesia complications

## 2015-02-01 NOTE — Progress Notes (Signed)
Plan of care discussed with patient. Patient would liked to be awaking for pain medication. Plan of care placed on bedside Patient teaching giving to patient use of incentive spirometer with teach back.

## 2015-02-01 NOTE — Anesthesia Preprocedure Evaluation (Signed)
Anesthesia Evaluation  Patient identified by MRN, date of birth, ID band  Reviewed: Allergy & Precautions, NPO status , Patient's Chart, lab work & pertinent test results  History of Anesthesia Complications Negative for: history of anesthetic complications  Airway Mallampati: II  TM Distance: >3 FB Neck ROM: Full    Dental  (+) Missing   Pulmonary asthma (remote hx) , former smoker (quit 1 1/2 yrs),           Cardiovascular hypertension, Pt. on medications      Neuro/Psych Anxiety Depression Bipolar Disorder    GI/Hepatic   Endo/Other  Morbid obesity  Renal/GU      Musculoskeletal  (+) Arthritis , Osteoarthritis,  Fibromyalgia -  Abdominal   Peds  Hematology   Anesthesia Other Findings   Reproductive/Obstetrics                             Anesthesia Physical Anesthesia Plan  ASA: III  Anesthesia Plan: Spinal   Post-op Pain Management:    Induction: Intravenous  Airway Management Planned:   Additional Equipment:   Intra-op Plan:   Post-operative Plan:   Informed Consent: I have reviewed the patients History and Physical, chart, labs and discussed the procedure including the risks, benefits and alternatives for the proposed anesthesia with the patient or authorized representative who has indicated his/her understanding and acceptance.     Plan Discussed with:   Anesthesia Plan Comments:         Anesthesia Quick Evaluation

## 2015-02-01 NOTE — Anesthesia Procedure Notes (Signed)
Spinal Patient location during procedure: OR Start time: 02/01/2015 7:43 AM Staffing Anesthesiologist: Naomie Dean Resident/CRNA: Chong Sicilian Performed by: resident/CRNA  Preanesthetic Checklist Completed: patient identified, site marked, surgical consent, pre-op evaluation, timeout performed, IV checked, risks and benefits discussed and monitors and equipment checked Spinal Block Patient position: sitting Prep: Betadine Patient monitoring: heart rate, continuous pulse ox and blood pressure Approach: midline Location: L3-4 Injection technique: single-shot Needle Needle type: Whitacre  Needle gauge: 22 G Assessment Sensory level: T8 Additional Notes Clear csf return no paresthesia

## 2015-02-01 NOTE — Progress Notes (Signed)
Pedis pulse felt to right

## 2015-02-01 NOTE — H&P (Signed)
  H and P reviewed. No changes. Uploaded at later date. 

## 2015-02-01 NOTE — Progress Notes (Signed)
ua sent to lab for culture

## 2015-02-01 NOTE — Evaluation (Signed)
Physical Therapy Evaluation Patient Details Name: Shelly Wright MRN: [id number] DOB: [birth year] Today's Date: 02/01/2015   History of Present Illness  Pt underwent R TKR and is POD#0 at time of evaluation. Pt reports independent community ambulation with single point cane prior to surgery. No reported falls in the last 12 months.  Clinical Impression  Pt demonstrates excellent RLE strength with mobility on this date. She is able to transfer from bed to recliner with CGA only and likely would have ambulated further but deferred at this time by therapist. Pt able to complete all bed exercises with some increase in R knee pain. Pt will be appropriate to return home with [facility] PT and family when medically stable. Pt will benefit from skilled PT services to address deficits in strength, balance, and mobility in order to return to full function at home.     Follow Up Recommendations Home health PT;Supervision - Intermittent    Equipment Recommendations  Rolling walker with 5" wheels (Pt Bray have walker at home, will check)    Recommendations for Other Services       Precautions / Restrictions Precautions Precautions: Fall;Knee Restrictions Weight Bearing Restrictions: Yes RLE Weight Bearing: Weight bearing as tolerated      Mobility  Bed Mobility Overal bed mobility: Needs Assistance Bed Mobility: Supine to Sit     Supine to sit: Min assist     General bed mobility comments: Reasonable speed and sequencing noted. Good R hip and knee strength noted. MinA+1 to support R knee  Transfers Overall transfer level: Needs assistance Equipment used: Rolling walker (2 wheeled) Transfers: Sit to/from Stand Sit to Stand: Min guard         General transfer comment: Pt demonstrates excellent stability and sequencing. Decreased weight shifting to RLE  Ambulation/Gait Ambulation/Gait assistance: Min guard Ambulation Distance (Feet): 3 Feet Assistive device: Rolling walker (2 wheeled) Gait  Pattern/deviations: Step-to pattern;Antalgic   Gait velocity interpretation: <1.8 ft/sec, indicative of risk for recurrent falls General Gait Details: Pt is able to transfer from bed to recliner. She demonstrates good sequencing with instruction. Decreased weight acceptance to RLE and decrease R knee flexion during swing. No overt LOB. Further ambulation deferred by therapist at this time  Stairs            Wheelchair Mobility    Modified Rankin (Stroke Patients Only)       Balance Overall balance assessment: Needs assistance   Sitting balance-Leahy Scale: Good       Standing balance-Leahy Scale: Fair                               Pertinent Vitals/Pain Pain Assessment: 0-10 Pain Score: 4  Pain Location: R knee Pain Intervention(s): Monitored during session;Premedicated before session    Home Living Family/patient expects to be discharged to:: Private residence Living Arrangements: Spouse/significant other;Other relatives Available Help at Discharge: Family Type of Home: House Home Access: Stairs to enter Entrance Stairs-Rails: Right Entrance Stairs-Number of Steps: 3-4 Home Layout: One level Home Equipment: Cane - single point (Unsure if she has walker or not)      Prior Function Level of Independence: Independent with assistive device(s)         Comments: single point cane     Hand Dominance        Extremity/Trunk Assessment   Upper Extremity Assessment: Overall WFL for tasks assessed  Lower Extremity Assessment: RLE deficits/detail RLE Deficits / Details: Pt able to perform SLR and SAQ without assistance. Full DF/PF. LLE is [facility]       Communication   Communication: No difficulties  Cognition Arousal/Alertness: Awake/alert Behavior During Therapy: WFL for tasks assessed/performed Overall Cognitive Status: Within Functional Limits for tasks assessed                      General Comments      Exercises  Total Joint Exercises Ankle Circles/Pumps: Strengthening;Both;10 reps;Supine Quad Sets: Strengthening;Both;10 reps;Supine Gluteal Sets: Strengthening;Both;10 reps;Supine Towel Squeeze: Strengthening;Both;10 reps;Supine Short Arc Quad: Strengthening;Both;10 reps;Supine Heel Slides: Strengthening;Both;10 reps;Supine Hip ABduction/ADduction: Strengthening;Both;10 reps;Supine Straight Leg Raises: Strengthening;Both;10 reps;Supine      Assessment/Plan    PT Assessment Patient needs continued PT services  PT Diagnosis Difficulty walking;Abnormality of gait;Generalized weakness;Acute pain   PT Problem List Decreased strength;Decreased range of motion;Decreased activity tolerance;Decreased balance;Decreased mobility;Pain  PT Treatment Interventions DME instruction;Gait training;Stair training;Functional mobility training;Therapeutic activities;Therapeutic exercise;Balance training;Neuromuscular re-education;Patient/family education;Manual techniques   PT Goals (Current goals can be found in the Care Plan section) Acute Rehab PT Goals Patient Stated Goal: "I want to get back home" PT Goal Formulation: With patient Time For Goal Achievement: 02/15/15 Potential to Achieve Goals: Good    Frequency BID   Barriers to discharge        Co-evaluation               End of Session Equipment Utilized During Treatment: Gait belt Activity Tolerance: Patient tolerated treatment well Patient left: in chair;with call bell/phone within reach;with family/visitor present (No alarm applied, agrees to call for RN prior to transfer) Nurse Communication: Mobility status         Time: 1540-1610 PT Time Calculation (min) (ACUTE ONLY): 30 min   Charges:   PT Evaluation $Initial PT Evaluation Tier I: 1 Procedure PT Treatments $Therapeutic Exercise: 8-22 mins   PT G Codes:       Sharalyn Ink Zona Pedro PT, DPT   Keairra Bardon 02/01/2015, 4:37 PM

## 2015-02-02 ENCOUNTER — Encounter: Payer: Self-pay | Admitting: Unknown Physician Specialty

## 2015-02-02 LAB — URINALYSIS COMPLETE WITH MICROSCOPIC (ARMC ONLY)
BACTERIA UA: NONE SEEN
BILIRUBIN URINE: NEGATIVE
Bilirubin Urine: NEGATIVE
GLUCOSE, UA: NEGATIVE mg/dL
Glucose, UA: 150 mg/dL — AB
HGB URINE DIPSTICK: NEGATIVE
Hgb urine dipstick: NEGATIVE
Ketones, ur: NEGATIVE mg/dL
Leukocytes, UA: NEGATIVE
Leukocytes, UA: NEGATIVE
NITRITE: NEGATIVE
Nitrite: NEGATIVE
PROTEIN: 30 mg/dL — AB
PROTEIN: 30 mg/dL — AB
SPECIFIC GRAVITY, URINE: 1.031 — AB (ref 1.005–1.030)
SQUAMOUS EPITHELIAL / LPF: NONE SEEN
Specific Gravity, Urine: 1.03 (ref 1.005–1.030)
WBC, UA: NONE SEEN WBC/hpf (ref 0–5)
pH: 5 (ref 5.0–8.0)
pH: 5 (ref 5.0–8.0)

## 2015-02-02 LAB — TYPE AND SCREEN
ABO/RH(D): AB POS
Antibody Screen: NEGATIVE
Unit division: 0
Unit division: 0

## 2015-02-02 LAB — BASIC METABOLIC PANEL
Anion gap: 8 (ref 5–15)
BUN: 14 mg/dL (ref 6–20)
CALCIUM: 8.6 mg/dL — AB (ref 8.9–10.3)
CO2: 27 mmol/L (ref 22–32)
Chloride: 102 mmol/L (ref 101–111)
Creatinine, Ser: 1.06 mg/dL — ABNORMAL HIGH (ref 0.44–1.00)
GFR calc Af Amer: >60 mL/min (ref >60)
GLUCOSE: 215 mg/dL — AB (ref 65–99)
Potassium: 4 mmol/L (ref 3.5–5.1)
Sodium: 137 mmol/L (ref 135–145)

## 2015-02-02 LAB — CBC
HEMATOCRIT: 36.6 % (ref 35.0–47.0)
Hemoglobin: 12 g/dL (ref 12.0–16.0)
MCH: 28.4 pg (ref 26.0–34.0)
MCHC: 32.8 g/dL (ref 32.0–36.0)
MCV: 86.7 fL (ref 80.0–100.0)
PLATELETS: 242 10*3/uL (ref 150–440)
RBC: 4.22 MIL/uL (ref 3.80–5.20)
RDW: 13.4 % (ref 11.5–14.5)
WBC: 13.5 10*3/uL — AB (ref 3.6–11.0)

## 2015-02-02 LAB — URINE CULTURE: CULTURE: NO GROWTH

## 2015-02-02 MED ORDER — ENOXAPARIN SODIUM 40 MG/0.4ML ~~LOC~~ SOLN
40.0000 mg | Freq: Two times a day (BID) | SUBCUTANEOUS | Status: DC
  Administered 2015-02-02 – 2015-02-03 (×3): 40 mg via SUBCUTANEOUS
  Filled 2015-02-02 (×3): qty 0.4

## 2015-02-02 NOTE — Anesthesia Postprocedure Evaluation (Signed)
  Anesthesia Post-op Note  Patient: Shelly Wright  Procedure(s) Performed: Procedure(s): TOTAL KNEE ARTHROPLASTY (Right)  Anesthesia type:Spinal  Patient location: 150A  Post pain: Pain level controlled  Post assessment: Post-op Vital signs reviewed, Patient's Cardiovascular Status Stable, Respiratory Function Stable, Patent Airway and No signs of Nausea or vomiting  Post vital signs: Reviewed and stable  Last Vitals:  Filed Vitals:   02/02/15 0448  BP: 158/87  Pulse: 84  Temp: 36.8 C  Resp: 18    Level of consciousness: awake, alert  and patient cooperative  Complications: No apparent anesthesia complications

## 2015-02-02 NOTE — Progress Notes (Signed)
Patient tolerated regular diet well. IV fluids stopped per protocol.

## 2015-02-02 NOTE — Progress Notes (Signed)
Inpatient Diabetes Program Recommendations  AACE/ADA: New Consensus Statement on Inpatient Glycemic Control (2015)  Target Ranges:  Prepandial:   less than 140 mg/dL      Peak postprandial:   less than 180 mg/dL (1-2 hours)      Critically ill patients:  140 - 180 mg/dL   Review of Glycemic Control  Diabetes history: none Outpatient Diabetes medications: none Current orders for Inpatient glycemic control: none  Inpatient Diabetes Program Recommendations:     No history of diabetes noted in the chart.  Blood sugar /dl this morning.   Please consider ordering an A1C to determine blood sugar control over the past 3 months.  Consider ordering tid, hs blood sugar checks.  Susette Racer, RN, BA, MHA, CDE Diabetes Coordinator Inpatient Diabetes Program  [phone] (Team Pager) [phone] [facility] Office) 02/02/2015 4:39 PM

## 2015-02-02 NOTE — Progress Notes (Signed)
Patient reasonably comfortable at present. She apparently was able to ambulate around the nursing station today. She was using a walker. She felt like she was definitely becoming more functional. Her hemoglobin was 12. She has been afebrile. She has had some increase in her systolic blood pressure which hopefully will decrease once some of her discomfort resolves. She discontinued her TENS unit because it was not providing any significant extra symptomatic relief.  It's possible she Laible be able to be discharged home tomorrow to return to the Orthopedic Department about 10 days post discharge. She will be using bilateral leg compression devices at home. She will be getting 40 mg of Lovenox subcutaneous daily for about 2 more weeks. She is going to be followed by home health physical therapy.

## 2015-02-02 NOTE — Care Management Note (Signed)
Case Management Note  Patient Details  Name: Shelly Wright MRN: [id number] Date of Birth: 06/29/1970  Subjective/Objective:                  Met with patient and her sister to discuss discharge planning. Patient states her sister will be staying with her at patient's address to assist her. She plans to return home. She would like to use [facility] home health if they take her insurance- her niece used that agency in the past. She has a "old walker that is unaccessible". She requests a new one as that one "is really old". She uses [facility] for Rx 336) [phone].  Action/Plan: List provided for home health agencies. Referral made to [facility] per patient request. Rolling walker requested from [place] to be delivered to this room. Lovenox 65m #14 called in to [facility] for price. RNCM will continue to follow.   Expected Discharge Date:                  Expected Discharge Plan:     In-House Referral:     Discharge planning Services  CM Consult  Post Acute Care Choice:  Home Health, Durable Medical Equipment Choice offered to:  Patient, Sibling  DME Arranged:  Walker rolling DME Agency[facility]  [id number]     Status of Service:  In process, will continue to follow  Medicare Important Message Given:    Date Medicare IM Given:    Medicare IM give by:    Date Additional Medicare IM Given:    Additional Medicare Important Message give by:     If discussed at [place], dates discussed:    Additional Comments:  AMarshell Garfinkel RN 02/02/2015, 11:06 AM

## 2015-02-02 NOTE — Progress Notes (Signed)
Physical Therapy Treatment Patient Details Name: Shelly Wright MRN: [id number] DOB: [birth year] Today's Date: 02/02/2015    History of Present Illness Pt underwent R TKR and is POD#0 at time of evaluation. Pt reports independent community ambulation with single point cane prior to surgery. No reported falls in the last 12 months.    PT Comments    Pt continues to make excellent progress with therapy. She is able to ambulate from room to rehab gym and back with normalizing gait patterns and imrpoving speed. Pt completes all exercises at EOB as instructed reporting minimal increase in pain. Pt able to complete stair training and demonstrates good strength and safety. Pt would be an excellent candidate for discharge on POD#2 if she is medically appropriate. Pt will complete lap around RN station with PT in AM and repeat stairs if more practice is desired. Pt will benefit from skilled PT services to address deficits in strength, balance, and mobility in order to return to full function at home.    Follow Up Recommendations  Home health PT;Supervision - Intermittent     Equipment Recommendations  Rolling walker with 5" wheels    Recommendations for Other Services       Precautions / Restrictions Precautions Precautions: Fall;Knee Restrictions Weight Bearing Restrictions: Yes RLE Weight Bearing: Weight bearing as tolerated    Mobility  Bed Mobility Overal bed mobility: Independent Bed Mobility: Supine to Sit     Supine to sit: Independent     General bed mobility comments: Good speed and sequencing  Transfers Overall transfer level: Needs assistance Equipment used: Rolling walker (2 wheeled) Transfers: Sit to/from Stand Sit to Stand: Min guard         General transfer comment: Improving stability and LE strength. Pt still with decreased weight shifting to RLE  Ambulation/Gait Ambulation/Gait assistance: Min guard Ambulation Distance (Feet): 200 Feet Assistive device:  Rolling walker (2 wheeled) Gait Pattern/deviations: Decreased step length - left;Decreased stance time - right   Gait velocity interpretation: <1.8 ft/sec, indicative of risk for recurrent falls General Gait Details: Pt demonstrates improving step length and R knee flexion during swing with cues. Overall speed is improving as well. Pt reports initial increase in R knee pain during ambulation but gradually improves with increased distance.   Stairs Stairs: Yes Stairs assistance: Min guard Stair Management: Two rails;Step to pattern Number of Stairs: 4 General stair comments: Education for proper sequencing. Pt reports history of prior education from arthroscopic knee surgeries. Pt demonstrates good safety and stability during stairs  Wheelchair Mobility    Modified Rankin (Stroke Patients Only)       Balance Overall balance assessment: Needs assistance Sitting-balance support: No upper extremity supported Sitting balance-Leahy Scale: Good       Standing balance-Leahy Scale: Fair                      Cognition Arousal/Alertness: Awake/alert Behavior During Therapy: WFL for tasks assessed/performed Overall Cognitive Status: Within Functional Limits for tasks assessed                      Exercises Total Joint Exercises Ankle Circles/Pumps: AROM;Both;Supine;10 reps Quad Sets: Right;10 reps;Supine;Strengthening Towel Squeeze: Strengthening;Both;10 reps;Seated Heel Slides: AROM;10 reps;Seated Hip ABduction/ADduction: Strengthening;Both;10 reps;Seated Straight Leg Raises: Strengthening;Right;Supine;15 reps Long Arc Quad: Strengthening;Right;15 reps;Seated Knee Flexion: Strengthening;Right;10 reps;Seated Goniometric ROM: Flexion (recorded in sitting): 64 Extension (recorded in supine): -4     General Comments  Pertinent Vitals/Pain Pain Assessment: 0-10 Pain Score: 3  Pain Location: R knee Pain Intervention(s): Monitored during session    Home  Living                      Prior Function            PT Goals (current goals can now be found in the care plan section) Acute Rehab PT Goals Patient Stated Goal: "I want to get back home" PT Goal Formulation: With patient Time For Goal Achievement: 02/15/15 Potential to Achieve Goals: Good Progress towards PT goals: Progressing toward goals    Frequency  BID    PT Plan Current plan remains appropriate    Co-evaluation             End of Session Equipment Utilized During Treatment: Gait belt Activity Tolerance: Patient tolerated treatment well Patient left: with family/visitor present;with call bell/phone within reach;with SCD's reapplied;in bed;with bed alarm set (polar care in place, towel roll under heel. TENS off per pt)     Time: 9629-5284 PT Time Calculation (min) (ACUTE ONLY): 24 min  Charges:  $Gait Training: 8-22 mins $Therapeutic Exercise: 8-22 mins                    G Codes:      Sharalyn Ink Huprich PT, DPT   Huprich,Jason 02/02/2015, 2:39 PM

## 2015-02-02 NOTE — Progress Notes (Signed)
  Subjective: 1 Day Post-Op Procedure(s) (LRB): TOTAL KNEE ARTHROPLASTY (Right) Patient reports pain as mild.   Patient seen in rounds with Dr. Gavin Potters. Patient is well, and has had no acute complaints or problems Plan is to go Home after hospital stay. Negative for chest pain and shortness of breath Fever: no Gastrointestinal: Negative for nausea and vomiting  Objective: Vital signs in last 24 hours: Temp:  [97.4 F (36.3 C)-98.3 F (36.8 C)] 98.3 F (36.8 C) (09/15 0448) Pulse Rate:  [64-90] 84 (09/15 0448) Resp:  [12-18] 18 (09/15 0448) BP: (107-161)/(61-99) 158/87 mmHg (09/15 0448) SpO2:  [95 %-100 %] 96 % (09/15 0448) Weight:  [128.822 kg (284 lb)] 128.822 kg (284 lb) (09/14 0657)  Intake/Output from previous day:  Intake/Output Summary (Last 24 hours) at 02/02/15 0622 Last data filed at 02/02/15 0523  Gross per 24 hour  Intake   3375 ml  Output   1520 ml  Net   1855 ml    Intake/Output this shift: Total I/O In: 875 [I.V.:825; IV Piggyback:50] Out: 510 [Urine:510]  Labs:  Recent Labs  02/01/15 1406  HGB 12.4    Recent Labs  02/01/15 1406  WBC 17.5*  RBC 4.38  HCT 37.5  PLT 238    Recent Labs  02/01/15 1406  CREATININE 1.06*   No results for input(s): LABPT, INR in the last 72 hours.   EXAM General - Patient is Alert and Oriented Extremity - Neurovascular intact Dorsiflexion/Plantar flexion intact Compartment soft Dressing/Incision - clean, dry, no drainage Motor Function - intact, moving foot and toes well on exam. The patient ambulated to the bathroom this morning.  Past Medical History  Diagnosis Date  . Hypertension   . Asthma     in past, no current inhalers  . Depression   . Anxiety   . Arthritis   . Fibromyalgia   . Restless leg syndrome   . Bipolar disorder   . DDD (degenerative disc disease), lumbar     Assessment/Plan: 1 Day Post-Op Procedure(s) (LRB): TOTAL KNEE ARTHROPLASTY (Right) Active Problems:   S/P total  knee replacement  Estimated body mass index is 48.72 kg/(m^2) as calculated from the following:   Height as of this encounter:  (1.626 m).   Weight as of this encounter: 128.822 kg (284 lb). Advance diet Up with therapy  DVT Prophylaxis - Lovenox, Foot Pumps and TED hose Weight-Bearing as tolerated to right leg  Dedra Skeens, PA-C Orthopaedic Surgery 02/02/2015, 6:22 AM

## 2015-02-02 NOTE — Progress Notes (Signed)
Patient's up and ambulating in hallway with PT.  

## 2015-02-02 NOTE — Progress Notes (Addendum)
Dr.Kernodle here to see patient - discussing plan of care. MD informed of elevated blood pressure 167/96. Will continue to monitor the patient.

## 2015-02-02 NOTE — Progress Notes (Signed)
Enoxaparin in Post Surgery Patient  Shelly Wright is a 44 year old post Rt total knee arthroplasty patient being treated with Enoxaparin for DVT prophylaxis.  Patient had order for Enoxaparin 30 mg SQ BID to start @ 00:00 on 9/15. MD had specific instructions for Enoxaparin to start 12-18 hours post surgery. 00:00 dose of Enoxaparin was not given on 9/15 due to parameters not met per RN's documentation on MAR.  Next dose defaulted to 12:00 on 9/15 (12 hours after the initial dose), which is appx 24 hours post dose.   Patient qualifies for Enoxaparin 40 mg SQ BID due to BMI >70 per policy.  H/H stable.   Will change to Enoxaparin 40 mg SQ BID starting STAT due to patient not receiving the scheduled dose on 00:00 @ 09/15.   Larene Beach, PharmD, BCPS

## 2015-02-02 NOTE — Progress Notes (Signed)
Foley removed at 0523. Patient ambulated to BR with rolling walker and one assist. Patient denies pain. UA still pending.

## 2015-02-02 NOTE — Progress Notes (Signed)
Occupational Therapy Treatment Patient Details Name: Caysie Minnifield Hild MRN: [id number] DOB: 1971-04-20 Today's Date: 02/02/2015    History of present illness Pt underwent R TKR yesterday. Pt reports independent community ambulation with single point cane prior to surgery. No reported falls in the last 12 months.   OT comments  44 year old female who came to [facility] for a R TKR.  Follow Up Recommendations  No OT follow up (home with home health PT no further OT needed.)    Equipment Recommendations       Recommendations for Other Services      Precautions / Restrictions Precautions Precautions: Fall;Knee Restrictions Weight Bearing Restrictions: Yes RLE Weight Bearing: Weight bearing as tolerated       Mobility Bed Mobility             Balance                            ADL  Patient demonstrated ability to dress self with supervision and removed right sock and put it back on with out assistive devices.                                              Vision                     Perception     Praxis      Cognition   Behavior During Therapy: WFL for tasks assessed/performed Overall Cognitive Status: Within Functional Limits for tasks assessed                       Extremity/Trunk Assessment               Exercises   Shoulder Instructions       General Comments      Pertinent Vitals/ Pain       Pain Assessment: 0-10 Pain Score: 6  Pain Location: R knee Pain Intervention(s): Monitored during session;Ice applied;Repositioned Meds on the way.  Home Living                                          Prior Functioning/Environment              Frequency       Progress Toward Goals  OT Goals(current goals can now be found in the care plan section)     Acute Rehab OT Goals Patient Stated Goal: "I want to get back home"  Plan      Co-evaluation                 End of Session Equipment Utilized During Treatment:  (Taught hip kit but did not need it.)   Activity Tolerance     Patient Left     Nurse Communication          Time: 8016-5537 OT Time Calculation (min): 10 min  Charges: OT General Charges $OT Visit: 1 Procedure OT Treatments $Self Care/Home Management : 8-22 mins  Myrene Galas, MS/OTR/L  02/02/2015, 1:38 PM

## 2015-02-02 NOTE — Progress Notes (Signed)
Physical Therapy Treatment Patient Details Name: Shelly Wright MRN: [id number] DOB: 04-Nov-1970 Today's Date: 02/02/2015    History of Present Illness Pt underwent R TKR and is POD#0 at time of evaluation. Pt reports independent community ambulation with single point cane prior to surgery. No reported falls in the last 12 months.    PT Comments    Pt was able to display increased capacity for functional mobility this morning as she ambulated ~125 ft with RW and min guard +1 from PT. Pt is motivated and is making good progress toward her goals. Goniometric measurement of her R knee ROM were taken today; Extension: -4 and Flexion: 64. Pt will continue to benefit from skilled PT services in order to increase strength and ROM and increase her functional mobility in order to facilitate a safe return to her home.   Follow Up Recommendations  Home health PT;Supervision - Intermittent     Equipment Recommendations  Rolling walker with 5" wheels    Recommendations for Other Services       Precautions / Restrictions Precautions Precautions: Fall;Knee Restrictions Weight Bearing Restrictions: Yes RLE Weight Bearing: Weight bearing as tolerated    Mobility  Bed Mobility Overal bed mobility: Independent Bed Mobility: Supine to Sit     Supine to sit: Independent     General bed mobility comments: Pt demonstrated independence with bed mobility task today (supine with HOB elevated to sitting on EOB)   Transfers Overall transfer level: Needs assistance Equipment used: Rolling walker (2 wheeled) Transfers: Sit to/from Stand Sit to Stand: Min guard         General transfer comment: Pt demonstrates excellent stability and sequencing. Decreased weight shifting to RLE.   Ambulation/Gait Ambulation/Gait assistance: Min guard Ambulation Distance (Feet): 125 Feet Assistive device: Rolling walker (2 wheeled) Gait Pattern/deviations: Step-to pattern;Antalgic   Gait velocity  interpretation: <1.8 ft/sec, indicative of risk for recurrent falls General Gait Details:  (Pt given cues during gait for equal and longer step lengths, as well as cues for greater degree of knee flexion during swing phase of gait on R in order for normalization of gait pattern. )   Stairs            Wheelchair Mobility    Modified Rankin (Stroke Patients Only)       Balance Overall balance assessment: Needs assistance Sitting-balance support: No upper extremity supported Sitting balance-Leahy Scale: Good       Standing balance-Leahy Scale: Fair                      Cognition Arousal/Alertness: Awake/alert Behavior During Therapy: WFL for tasks assessed/performed Overall Cognitive Status: Within Functional Limits for tasks assessed                      Exercises Total Joint Exercises Ankle Circles/Pumps: AROM;Both;20 reps;Supine Quad Sets: Right;10 reps;Supine;Strengthening Towel Squeeze: Seated;AROM;Strengthening;10 reps Heel Slides: AROM;10 reps;Seated Hip ABduction/ADduction: Strengthening;10 reps;AROM;Supine (performed AROM in supine and then at end of session performed active resisted ABD x 10 while seated) Straight Leg Raises: AROM;10 reps;Supine Long Arc Quad: AROM;10 reps;Seated (PT assisted OP for extension) Goniometric ROM: Flexion (recorded in sitting): 64 Extension (recorded in supine): -4     General Comments        Pertinent Vitals/Pain Pain Assessment: 0-10 Pain Score: 3  Pain Location: R knee Pain Intervention(s): Monitored during session;Ice applied;Repositioned    Home Living  Prior Function            PT Goals (current goals can now be found in the care plan section) Acute Rehab PT Goals Patient Stated Goal: "I want to get back home" PT Goal Formulation: With patient Time For Goal Achievement: 02/15/15 Potential to Achieve Goals: Good Progress towards PT goals: Progressing toward  goals    Frequency  BID    PT Plan Current plan remains appropriate    Co-evaluation             End of Session Equipment Utilized During Treatment: Gait belt Activity Tolerance: Patient tolerated treatment well Patient left: in chair;with family/visitor present;with call bell/phone within reach;with SCD's reapplied (chair alarm not set per pt request, ice and TENS unit reapplied to R knee)     Time: 1610-9604 PT Time Calculation (min) (ACUTE ONLY): 30 min  Charges:                       G CodesGeorgina Peer 02/02/2015, 12:51 PM

## 2015-02-03 LAB — CBC
HEMATOCRIT: 32.4 % — AB (ref 35.0–47.0)
HEMOGLOBIN: 10.9 g/dL — AB (ref 12.0–16.0)
MCH: 29.2 pg (ref 26.0–34.0)
MCHC: 33.7 g/dL (ref 32.0–36.0)
MCV: 86.7 fL (ref 80.0–100.0)
Platelets: 216 10*3/uL (ref 150–440)
RBC: 3.73 MIL/uL — AB (ref 3.80–5.20)
RDW: 13.4 % (ref 11.5–14.5)
WBC: 14.8 10*3/uL — AB (ref 3.6–11.0)

## 2015-02-03 LAB — CREATININE, SERUM: Creatinine, Ser: 0.86 mg/dL (ref 0.44–1.00)

## 2015-02-03 MED ORDER — BISACODYL 10 MG RE SUPP
10.0000 mg | Freq: Every day | RECTAL | Status: DC | PRN
  Administered 2015-02-03: 10 mg via RECTAL
  Filled 2015-02-03: qty 1

## 2015-02-03 MED ORDER — ENOXAPARIN SODIUM 40 MG/0.4ML ~~LOC~~ SOLN: 40.0000 mg | Freq: Two times a day (BID) | SUBCUTANEOUS | Status: DC

## 2015-02-03 MED ORDER — OXYCODONE HCL 5 MG PO TABS: 5.0000 mg | ORAL_TABLET | ORAL | Status: DC | PRN

## 2015-02-03 NOTE — Progress Notes (Signed)
  Subjective: 2 Days Post-Op Procedure(s) (LRB): TOTAL KNEE ARTHROPLASTY (Right) Patient reports pain as mild.   Patient seen in rounds with Dr. Gavin Potters. Patient is well, and has had no acute complaints or problems Plan is to go Home after hospital stay. Negative for chest pain and shortness of breath Fever: no Gastrointestinal: Negative for nausea and vomiting  Objective: Vital signs in last 24 hours: Temp:  [98 F (36.7 C)-99.1 F (37.3 C)] 99.1 F (37.3 C) (09/16 0411) Pulse Rate:  [85-108] 97 (09/16 0411) Resp:  [18-20] 19 (09/16 0411) BP: (133-170)/(77-96) 133/77 mmHg (09/16 0411) SpO2:  [95 %-99 %] 98 % (09/16 0411)  Intake/Output from previous day:  Intake/Output Summary (Last 24 hours) at 02/03/15 0619 Last data filed at 02/02/15 1900  Gross per 24 hour  Intake 1512.5 ml  Output      0 ml  Net 1512.5 ml    Intake/Output this shift:    Labs:  Recent Labs  02/01/15 1406 02/02/15 0621  HGB 12.4 12.0    Recent Labs  02/01/15 1406 02/02/15 0621  WBC 17.5* 13.5*  RBC 4.38 4.22  HCT 37.5 36.6  PLT 238 242    Recent Labs  02/01/15 1406 02/02/15 0621  NA  --  137  K  --  4.0  CL  --  102  CO2  --  27  BUN  --  14  CREATININE 1.06* 1.06*  GLUCOSE  --  215*  CALCIUM  --  8.6*   No results for input(s): LABPT, INR in the last 72 hours.   EXAM General - Patient is Alert and Oriented Extremity - Neurovascular intact Dorsiflexion/Plantar flexion intact No cellulitis present Compartment soft Dressing/Incision - clean, dry, no drainage, a new honeycomb dressing was applied with the surgical bed is removed. Motor Function - intact, moving foot and toes well on exam. The patient ambulated around the nurse's station and has done stairs on postop day 1.  Past Medical History  Diagnosis Date  . Hypertension   . Asthma     in past, no current inhalers  . Depression   . Anxiety   . Arthritis   . Fibromyalgia   . Restless leg syndrome   .  Bipolar disorder   . DDD (degenerative disc disease), lumbar     Assessment/Plan: 2 Days Post-Op Procedure(s) (LRB): TOTAL KNEE ARTHROPLASTY (Right) Active Problems:   S/P total knee replacement  Estimated body mass index is 48.72 kg/(m^2) as calculated from the following:   Height as of this encounter:  (1.626 m).   Weight as of this encounter: 128.822 kg (284 lb). Discharge home with home health  DVT Prophylaxis - Lovenox, Foot Pumps and TED hose Weight-Bearing as tolerated to right leg  Dedra Skeens, PA-C Orthopaedic Surgery 02/03/2015, 6:19 AM

## 2015-02-03 NOTE — Progress Notes (Addendum)
Patient alert and oriented. Reviewed AVS, prescriptions, and Venapro compression pumps. Patient stated she would not be able to afford co pay for Lovenox.

## 2015-02-03 NOTE — Discharge Summary (Signed)
Physician Discharge Summary  Subjective: 2 Days Post-Op Procedure(s) (LRB): TOTAL KNEE ARTHROPLASTY (Right) Patient reports pain as mild.   Patient seen in rounds with Dr. Gavin Potters. Patient is well, and has had no acute complaints or problems Patient is ready to go home with home health physical therapy.  Physician Discharge Summary  Patient ID: Shelly Wright MRN: [id number] DOB/AGE: 1970/10/13 44 y.o.  Admit date: 02/01/2015 Discharge date: 02/03/2015  Admission Diagnoses:  Discharge Diagnoses:  Active Problems:   S/P total knee replacement   Discharged Condition: good  Hospital Course: The patient is status post right total knee replacement that was done on 02/01/2015. The patient initially had an elevated white count the day of surgery, but this diminished from 17.5-13.5 on postoperative day 1. The patient had no postoperative fever and her urinalysis was essentially negative. After completing her antibiotics no new antibiotics were given. She worked with physical therapy on postop day one and ambulating around the nurse's station including stairs. The patient was ready to go home with home health physical therapy after having a bowel movement.  Treatments: surgery:  PROCEDURE: Right total knee arthroplasty  SURGEON: Dr.Harold Royann Shivers. M.D.  ASSISTANT: Dedra Skeens, PA-C   ANESTHESIA: Spinal  ESTIMATED BLOOD LOSS: 400 mL  TOURNIQUET TIME: 116 minutes   IMPLANTS UTILIZED: Triathlon #5 PS femoral component, #4 tibial component, #4 11 mm thick tibial bearing insert, 10 mm thick asymmetric patella  Discharge Exam: Blood pressure 133/77, pulse 97, temperature 99.1 F (37.3 C), temperature source Oral, resp. rate 19, height  (1.626 m), weight 128.822 kg (284 lb), SpO2 98 %.   Disposition:      Medication List    TAKE these medications        ARIPiprazole 10 MG tablet  Commonly known as:  ABILIFY  Take 10 mg by mouth daily.     diclofenac 75 MG EC  tablet  Commonly known as:  VOLTAREN  Take 75 mg by mouth 2 (two) times daily.     enoxaparin 40 MG/0.4ML injection  Commonly known as:  LOVENOX  Inject 0.4 mLs (40 mg total) into the skin every 12 (twelve) hours.     gabapentin 600 MG tablet  Commonly known as:  NEURONTIN  Take 900 mg by mouth 2 (two) times daily.     hydrochlorothiazide 12.5 MG capsule  Commonly known as:  MICROZIDE  Take 12.5 mg by mouth daily.     hydrOXYzine 50 MG tablet  Commonly known as:  ATARAX/VISTARIL  Take 50 mg by mouth 3 (three) times daily.     oxyCODONE 5 MG immediate release tablet  Commonly known as:  Oxy IR/ROXICODONE  Take 1-2 tablets (5-10 mg total) by mouth every 3 (three) hours as needed for breakthrough pain.     simvastatin 20 MG tablet  Commonly known as:  ZOCOR  Take 20 mg by mouth daily at 6 PM.     tiZANidine 4 MG tablet  Commonly known as:  ZANAFLEX  Take 4 mg by mouth 3 (three) times daily.     venlafaxine XR 75 MG 24 hr capsule  Commonly known as:  EFFEXOR-XR  Take 225 mg by mouth daily with breakfast.     Vitamin D3 5000 UNITS Caps  Take 1 capsule by mouth once a week.           Follow-up Information    Follow up with Randon Goldsmith, MD. Go on 02/16/2015.   Specialty:  Orthopedic Surgery   Why:  For staple removal.   Contact information:   [address] [place] [address] [phone]       Signed: Lenard Forth, TODD 02/03/2015, 6:23 AM   Objective: Vital signs in last 24 hours: Temp:  [98 F (36.7 C)-99.1 F (37.3 C)] 99.1 F (37.3 C) (09/16 0411) Pulse Rate:  [85-108] 97 (09/16 0411) Resp:  [18-20] 19 (09/16 0411) BP: (133-170)/(77-96) 133/77 mmHg (09/16 0411) SpO2:  [95 %-99 %] 98 % (09/16 0411)  Intake/Output from previous day:  Intake/Output Summary (Last 24 hours) at 02/03/15 0623 Last data filed at 02/02/15 1900  Gross per 24 hour  Intake 1512.5 ml  Output      0 ml  Net 1512.5 ml    Intake/Output this shift:     Labs:  Recent Labs  02/01/15 1406 02/02/15 0621  HGB 12.4 12.0    Recent Labs  02/01/15 1406 02/02/15 0621  WBC 17.5* 13.5*  RBC 4.38 4.22  HCT 37.5 36.6  PLT 238 242    Recent Labs  02/01/15 1406 02/02/15 0621  NA  --  137  K  --  4.0  CL  --  102  CO2  --  27  BUN  --  14  CREATININE 1.06* 1.06*  GLUCOSE  --  215*  CALCIUM  --  8.6*   No results for input(s): LABPT, INR in the last 72 hours.  EXAM: General - Patient is Alert and Oriented Extremity - Neurovascular intact Dorsiflexion/Plantar flexion intact No cellulitis present Compartment soft Incision - clean, dry, no drainage Motor Function -  the patient ambulated around the nurse's station including stairs. The patient has dorsiflexion and plantarflexion are intact.  Assessment/Plan: 2 Days Post-Op Procedure(s) (LRB): TOTAL KNEE ARTHROPLASTY (Right) Procedure(s) (LRB): TOTAL KNEE ARTHROPLASTY (Right) Past Medical History  Diagnosis Date  . Hypertension   . Asthma     in past, no current inhalers  . Depression   . Anxiety   . Arthritis   . Fibromyalgia   . Restless leg syndrome   . Bipolar disorder   . DDD (degenerative disc disease), lumbar    Active Problems:   S/P total knee replacement  Estimated body mass index is 48.72 kg/(m^2) as calculated from the following:   Height as of this encounter: 5\' 4"  (1.626 m).   Weight as of this encounter: 128.822 kg (284 lb). Discharge home with home health Diet - Regular diet Follow up - in 2 weeks Activity - WBAT Disposition - Home Condition Upon Discharge - Good DVT Prophylaxis - Lovenox and TED hose  Dedra Skeens, PA-C Orthopaedic Surgery 02/03/2015, 6:23 AM

## 2015-02-03 NOTE — Discharge Instructions (Signed)
TOTAL KNEE REPLACEMENT POSTOPERATIVE DIRECTIONS ° °Knee Rehabilitation, Guidelines Following Surgery  °Results after knee surgery are often greatly improved when you follow the exercise, range of motion and muscle strengthening exercises prescribed by your doctor. Safety measures are also important to protect the knee from further injury. Any time any of these exercises cause you to have increased pain or swelling in your knee joint, decrease the amount until you are comfortable again and slowly increase them. If you have problems or questions, call your caregiver or physical therapist for advice.  ° °HOME CARE INSTRUCTIONS  °Remove items at home which could result in a fall. This includes throw rugs or furniture in walking pathways.  °· ICE using the Polar Care unit to the affected knee every three hours for 30 minutes at a time and then as needed for pain and swelling.  Place a dry towel or pillow case over the knee before applying the Polar Care Unit.  Continue to use ice on the knee for pain and swelling from surgery. You Zenz notice swelling that will progress down to the foot and ankle.  This is normal after surgery.  Elevate the leg when you are not up walking on it.   °· Continue to use the breathing machine which will help keep your temperature down.  It is common for your temperature to cycle up and down following surgery, especially at night when you are not up moving around and exerting yourself.  The breathing machine keeps your lungs expanded and your temperature down. °· Do not place pillow under knee, focus on keeping the knee straight while resting ° °DIET °You Dethlefs resume your previous home diet once your are discharged from the hospital. ° °DRESSING / WOUND CARE / SHOWERING °Keep the surgical dressing until follow up.  The dressing is water proof, so you can shower without any extra covering.  IF THE DRESSING FALLS OFF or the wound gets wet inside, change the dressing with sterile gauze.  Please use  good hand washing techniques before changing the dressing.  Do not use any lotions or creams on the incision until instructed by your surgeon.   °You need to keep your wound dry after being discharged home.  Just keep the incision dry and apply a dry gauze dressing on daily. °Change the surgical dressing only if needed and reapply a dry dressing each time. ° °ACTIVITY °Walk with your walker as instructed. °Use walker as long as suggested by your caregivers. °Avoid periods of inactivity such as sitting longer than an hour when not asleep. This helps prevent blood clots.  °You Rentz resume a sexual relationship in one month or when given the OK by your doctor.  °You [place] return to work once you are cleared by your doctor.  °Do not drive a car for 6 weeks or until released by you surgeon.  °Do not drive while taking narcotics. ° °WEIGHT BEARING °Weight bearing as tolerated with assist device (walker, cane, etc) as directed, use it as long as suggested by your surgeon or therapist, typically at least 4-6 weeks. ° °POSTOPERATIVE CONSTIPATION PROTOCOL °Constipation - defined medically as fewer than three stools per week and severe constipation as less than one stool per week. ° °One of the most common issues patients have following surgery is constipation.  Even if you have a regular bowel pattern at home, your normal regimen is likely to be disrupted due to multiple reasons following surgery.  Combination of anesthesia, postoperative narcotics, change in   appetite and fluid intake all can affect your bowels.  In order to avoid complications following surgery, here are some recommendations in order to help you during your recovery period. ° °Colace (docusate) - Pick up an over-the-counter form of Colace or another stool softener and take twice a day as long as you are requiring postoperative pain medications.  Take with a full glass of water daily.  If you experience loose stools or diarrhea, hold the colace until you stool  forms back up.  If your symptoms do not get better within 1 week or if they get worse, check with your doctor. ° °Dulcolax (bisacodyl) - Pick up over-the-counter and take as directed by the product packaging as needed to assist with the movement of your bowels.  Take with a full glass of water.  Use this product as needed if not relieved by Colace only.  ° °MiraLax (polyethylene glycol) - Pick up over-the-counter to have on hand.  MiraLax is a solution that will increase the amount of water in your bowels to assist with bowel movements.  Take as directed and can mix with a glass of water, juice, soda, coffee, or tea.  Take if you go more than two days without a movement. °Do not use MiraLax more than once per day. Call your doctor if you are still constipated or irregular after using this medication for 7 days in a row. ° °If you continue to have problems with postoperative constipation, please contact the office for further assistance and recommendations.  If you experience "the worst abdominal pain ever" or develop nausea or vomiting, please contact the office immediatly for further recommendations for treatment. ° °ITCHING ° If you experience itching with your medications, try taking only a single pain pill, or even half a pain pill at a time.  You can also use Benadryl over the counter for itching or also to help with sleep.  ° °TED HOSE STOCKINGS °Wear the elastic stockings on both legs for six weeks following surgery during the day but you Fedorko remove then at night for sleeping. ° °MEDICATIONS °See your medication summary on the “After Visit Summary” that the nursing staff will review with you prior to discharge.  You Casad have some home medications which will be placed on hold until you complete the course of blood thinner medication.  It is important for you to complete the blood thinner medication as prescribed by your surgeon.  Continue your approved medications as instructed at time of  discharge. ° °PRECAUTIONS °If you experience chest pain or shortness of breath - call 911 immediately for transfer to the hospital emergency department.  °If you develop a fever greater that 101 F, purulent drainage from wound, increased redness or drainage from wound, foul odor from the wound/dressing, or calf pain - CONTACT YOUR SURGEON.   °                                                °FOLLOW-UP APPOINTMENTS °Make sure you keep all of your appointments after your operation with your surgeon and caregivers. You should call the office at the above phone number and make an appointment for approximately two weeks after the date of your surgery or on the date instructed by your surgeon outlined in the "After Visit Summary". ° ° °RANGE OF MOTION AND STRENGTHENING EXERCISES  °Rehabilitation   of the knee is important following a knee injury or an operation. After just a few days of immobilization, the muscles of the thigh which control the knee become weakened and shrink (atrophy). Knee exercises are designed to build up the tone and strength of the thigh muscles and to improve knee motion. Often times heat used for twenty to thirty minutes before working out will loosen up your tissues and help with improving the range of motion but do not use heat for the first two weeks following surgery. These exercises can be done on a training (exercise) mat, on the floor, on a table or on a bed. Use what ever works the best and is most comfortable for you Knee exercises include:  °Leg Lifts - While your knee is still immobilized in a splint or cast, you can do straight leg raises. Lift the leg to 60 degrees, hold for 3 sec, and slowly lower the leg. Repeat 10-20 times 2-3 times daily. Perform this exercise against resistance later as your knee gets better.  °Quad and Hamstring Sets - Tighten up the muscle on the front of the thigh (Quad) and hold for 5-10 sec. Repeat this 10-20 times hourly. Hamstring sets are done by pushing the  foot backward against an object and holding for 5-10 sec. Repeat as with quad sets.  °· Leg Slides: Lying on your back, slowly slide your foot toward your buttocks, bending your knee up off the floor (only go as far as is comfortable). Then slowly slide your foot back down until your leg is flat on the floor again. °· Angel Wings: Lying on your back spread your legs to the side as far apart as you can without causing discomfort.  °A rehabilitation program following serious knee injuries can speed recovery and prevent re-injury in the future due to weakened muscles. Contact your doctor or a physical therapist for more information on knee rehabilitation.  ° °IF YOU ARE TRANSFERRED TO A SKILLED REHAB FACILITY °If the patient is transferred to a skilled rehab facility following release from the hospital, a list of the current medications will be sent to the facility for the patient to continue.  When discharged from the skilled rehab facility, please have the facility set up the patient's Home Health Physical Therapy prior to being released. Also, the skilled facility will be responsible for providing the patient with their medications at time of release from the facility to include their pain medication, the muscle relaxants, and their blood thinner medication. If the patient is still at the rehab facility at time of the two week follow up appointment, the skilled rehab facility will also need to assist the patient in arranging follow up appointment in our office and any transportation needs. ° °MAKE SURE YOU:  °Understand these instructions.  °Get help right away if you are not doing well or get worse.  ° ° °Pick up stool softner and laxative for home use following surgery while on pain medications. °Do not submerge incision under water. °Please use good hand washing techniques while changing dressing each day. °Folk shower starting three days after surgery. °Please use a clean towel to pat the incision dry following  showers. °Continue to use ice for pain and swelling after surgery. °Do not use any lotions or creams on the incision until instructed by your surgeon. °

## 2015-02-03 NOTE — Progress Notes (Signed)
Spoke with  Skeens about lovenox, since patient is stating she can not get lovenox - he said she can take 325 mg aspirin daily. Instructions verbally witnessed via Lajuana Ripple and  [employer] written on AVS. Patient transported via nursing to family vehicle.

## 2015-02-03 NOTE — Care Management (Signed)
Lovenox $75.00. I have notified Barbara Cower with [facility] of patient discharge home today. Rolling walker has been delivered. No further RNCM needs. Case closed.

## 2015-02-03 NOTE — Progress Notes (Signed)
Physical Therapy Treatment Patient Details Name: Shelly Wright MRN: [id number] DOB: [birth year] Today's Date: 02/03/2015    History of Present Illness Pt underwent R TKR and is POD#0 at time of evaluation. Pt reports independent community ambulation with single point cane prior to surgery. No reported falls in the last 12 months.    PT Comments    Pt demonstrates continued progress with therapy. She is able to complete a full lap around RN station. Pt defers stairs today having practiced yesterday and feeling confident performing at home. She is able to progress to standing exercises. Education provided to patient about home exercises, positioning, stretches, and car transfers. Pt will benefit from skilled PT services to address deficits in strength, balance, and mobility in order to return to full function at home.    Follow Up Recommendations  Home health PT;Supervision - Intermittent     Equipment Recommendations  Rolling walker with 5" wheels    Recommendations for Other Services       Precautions / Restrictions Precautions Precautions: Fall;Knee Restrictions Weight Bearing Restrictions: Yes RLE Weight Bearing: Weight bearing as tolerated    Mobility  Bed Mobility Overal bed mobility: Independent Bed Mobility: Supine to Sit     Supine to sit: Independent     General bed mobility comments: Good speed and sequencing  Transfers Overall transfer level: Needs assistance Equipment used: Rolling walker (2 wheeled) Transfers: Sit to/from Stand Sit to Stand: Supervision         General transfer comment: Improving speed and stability with transfer. Pt able to perform with only bed rails sit>stand. Decreased weight acceptance through RLE  Ambulation/Gait Ambulation/Gait assistance: Min guard Ambulation Distance (Feet): 250 Feet Assistive device: Rolling walker (2 wheeled) Gait Pattern/deviations: Decreased step length - left;Decreased stance time - right   Gait  velocity interpretation: <1.8 ft/sec, indicative of risk for recurrent falls General Gait Details: Decreased R knee flexion during swing requiring cues to improve. Pt provided cues to decrease reliance on UE for support as well as increase L step length. Pt continues to walk with antalgic gait on RLE with increased reliance on bilateral UE. One standing rest break provided. Offerred to practice stairs with patient which she declines. Pt has already completed stairs with therapy and reprots she feels confident performing at home.   Stairs            Wheelchair Mobility    Modified Rankin (Stroke Patients Only)       Balance Overall balance assessment: Needs assistance   Sitting balance-Leahy Scale: Good       Standing balance-Leahy Scale: Fair                      Cognition Arousal/Alertness: Awake/alert Behavior During Therapy: WFL for tasks assessed/performed Overall Cognitive Status: Within Functional Limits for tasks assessed                      Exercises Total Joint Exercises Hip ABduction/ADduction: Strengthening;Both;Seated;15 reps Long Arc Quad: Strengthening;15 reps;Seated;Both Knee Flexion: Strengthening;Seated;Both;15 reps Goniometric ROM: -8 to 71 AAROM. Increased pain reported today with extension Other Exercises Other Exercises: Performed standing marches x 10 bilateral, standing R hip abduction x 10, standing R SLR x 10, standing mini squats x 10    General Comments        Pertinent Vitals/Pain Pain Assessment: 0-10 Pain Score: 6  Pain Location: R knee Pain Intervention(s): Monitored during session;Patient requesting pain meds-RN notified  Home Living                      Prior Function            PT Goals (current goals can now be found in the care plan section) Acute Rehab PT Goals Patient Stated Goal: "I want to get back home" PT Goal Formulation: With patient Time For Goal Achievement: 02/15/15 Potential to  Achieve Goals: Good Progress towards PT goals: Progressing toward goals    Frequency  BID    PT Plan Current plan remains appropriate    Co-evaluation             End of Session Equipment Utilized During Treatment: Gait belt Activity Tolerance: Patient tolerated treatment well Patient left: with family/visitor present;with call bell/phone within reach;in bed;with bed alarm set (polar care in place, towel roll under heel.)     Time: 1610-9604 PT Time Calculation (min) (ACUTE ONLY): 26 min  Charges:  $Gait Training: 8-22 mins $Therapeutic Exercise: 8-22 mins                    G Codes:      Sharalyn Ink Huprich PT, DPT   Huprich,Jason 02/03/2015, 10:20 AM

## 2015-07-13 DIAGNOSIS — Z96659 Presence of unspecified artificial knee joint: Secondary | ICD-10-CM

## 2015-07-13 DIAGNOSIS — Z96651 Presence of right artificial knee joint: Secondary | ICD-10-CM | POA: Insufficient documentation

## 2015-07-31 DIAGNOSIS — Z96659 Presence of unspecified artificial knee joint: Secondary | ICD-10-CM

## 2015-07-31 DIAGNOSIS — M978XXA Periprosthetic fracture around other internal prosthetic joint, initial encounter: Secondary | ICD-10-CM

## 2015-07-31 HISTORY — DX: Periprosthetic fracture around other internal prosthetic joint, initial encounter: M97.8XXA

## 2015-07-31 HISTORY — DX: Presence of unspecified artificial knee joint: Z96.659

## 2015-09-18 DIAGNOSIS — Z96651 Presence of right artificial knee joint: Secondary | ICD-10-CM

## 2015-09-18 HISTORY — DX: Presence of right artificial knee joint: Z96.651

## 2015-12-21 DIAGNOSIS — M25512 Pain in left shoulder: Secondary | ICD-10-CM

## 2015-12-21 DIAGNOSIS — M25511 Pain in right shoulder: Secondary | ICD-10-CM

## 2015-12-21 DIAGNOSIS — G8929 Other chronic pain: Secondary | ICD-10-CM | POA: Insufficient documentation

## 2015-12-21 DIAGNOSIS — M5412 Radiculopathy, cervical region: Secondary | ICD-10-CM | POA: Insufficient documentation

## 2016-01-19 HISTORY — PX: JOINT REPLACEMENT: SHX530

## 2016-05-06 ENCOUNTER — Other Ambulatory Visit
Admission: RE | Admit: 2016-05-06 | Discharge: 2016-05-06 | Disposition: A | Discharge status: Home / Self Care | Payer: BLUE CROSS/BLUE SHIELD | Source: Ambulatory Visit | Attending: Pain Medicine | Admitting: Pain Medicine

## 2016-05-06 ENCOUNTER — Ambulatory Visit: Payer: BLUE CROSS/BLUE SHIELD | Attending: Pain Medicine | Admitting: Pain Medicine

## 2016-05-06 ENCOUNTER — Encounter: Payer: Self-pay | Admitting: Pain Medicine

## 2016-05-06 ENCOUNTER — Other Ambulatory Visit: Payer: Self-pay | Admitting: *Deleted

## 2016-05-06 VITALS — BP 140/85 | HR 76 | Temp 98.3°F | Resp 16 | Ht 66.0 in | Wt 290.0 lb

## 2016-05-06 DIAGNOSIS — Z87891 Personal history of nicotine dependence: Secondary | ICD-10-CM | POA: Diagnosis not present

## 2016-05-06 DIAGNOSIS — F1190 Opioid use, unspecified, uncomplicated: Secondary | ICD-10-CM | POA: Insufficient documentation

## 2016-05-06 DIAGNOSIS — M25552 Pain in left hip: Secondary | ICD-10-CM | POA: Diagnosis not present

## 2016-05-06 DIAGNOSIS — M25559 Pain in unspecified hip: Secondary | ICD-10-CM

## 2016-05-06 DIAGNOSIS — M170 Bilateral primary osteoarthritis of knee: Secondary | ICD-10-CM | POA: Insufficient documentation

## 2016-05-06 DIAGNOSIS — M1711 Unilateral primary osteoarthritis, right knee: Secondary | ICD-10-CM | POA: Diagnosis not present

## 2016-05-06 DIAGNOSIS — G8929 Other chronic pain: Secondary | ICD-10-CM

## 2016-05-06 DIAGNOSIS — M545 Low back pain, unspecified: Secondary | ICD-10-CM

## 2016-05-06 DIAGNOSIS — M48062 Spinal stenosis, lumbar region with neurogenic claudication: Secondary | ICD-10-CM | POA: Diagnosis not present

## 2016-05-06 DIAGNOSIS — F3162 Bipolar disorder, current episode mixed, moderate: Secondary | ICD-10-CM | POA: Insufficient documentation

## 2016-05-06 DIAGNOSIS — M25511 Pain in right shoulder: Secondary | ICD-10-CM

## 2016-05-06 DIAGNOSIS — Z79899 Other long term (current) drug therapy: Secondary | ICD-10-CM | POA: Diagnosis not present

## 2016-05-06 DIAGNOSIS — I10 Essential (primary) hypertension: Secondary | ICD-10-CM | POA: Insufficient documentation

## 2016-05-06 DIAGNOSIS — G894 Chronic pain syndrome: Secondary | ICD-10-CM

## 2016-05-06 DIAGNOSIS — E669 Obesity, unspecified: Secondary | ICD-10-CM | POA: Insufficient documentation

## 2016-05-06 DIAGNOSIS — M25561 Pain in right knee: Secondary | ICD-10-CM

## 2016-05-06 DIAGNOSIS — Z6841 Body Mass Index (BMI) 40.0 and over, adult: Secondary | ICD-10-CM | POA: Insufficient documentation

## 2016-05-06 DIAGNOSIS — Z79891 Long term (current) use of opiate analgesic: Secondary | ICD-10-CM | POA: Diagnosis not present

## 2016-05-06 DIAGNOSIS — I499 Cardiac arrhythmia, unspecified: Secondary | ICD-10-CM | POA: Insufficient documentation

## 2016-05-06 DIAGNOSIS — M5116 Intervertebral disc disorders with radiculopathy, lumbar region: Secondary | ICD-10-CM | POA: Diagnosis not present

## 2016-05-06 DIAGNOSIS — Z7901 Long term (current) use of anticoagulants: Secondary | ICD-10-CM

## 2016-05-06 DIAGNOSIS — M25562 Pain in left knee: Secondary | ICD-10-CM

## 2016-05-06 DIAGNOSIS — M5412 Radiculopathy, cervical region: Secondary | ICD-10-CM | POA: Diagnosis not present

## 2016-05-06 DIAGNOSIS — F1120 Opioid dependence, uncomplicated: Secondary | ICD-10-CM | POA: Insufficient documentation

## 2016-05-06 DIAGNOSIS — M542 Cervicalgia: Secondary | ICD-10-CM

## 2016-05-06 DIAGNOSIS — F319 Bipolar disorder, unspecified: Secondary | ICD-10-CM

## 2016-05-06 DIAGNOSIS — E668 Other obesity: Secondary | ICD-10-CM | POA: Insufficient documentation

## 2016-05-06 DIAGNOSIS — M25512 Pain in left shoulder: Secondary | ICD-10-CM

## 2016-05-06 DIAGNOSIS — F3130 Bipolar disorder, current episode depressed, mild or moderate severity, unspecified: Secondary | ICD-10-CM | POA: Insufficient documentation

## 2016-05-06 HISTORY — DX: Long term (current) use of anticoagulants: Z79.01

## 2016-05-06 LAB — CBC
HEMATOCRIT: 45.5 % (ref 35.0–47.0)
HEMOGLOBIN: 15.4 g/dL (ref 12.0–16.0)
MCH: 29.7 pg (ref 26.0–34.0)
MCHC: 33.9 g/dL (ref 32.0–36.0)
MCV: 87.8 fL (ref 80.0–100.0)
Platelets: 287 10*3/uL (ref 150–440)
RBC: 5.19 MIL/uL (ref 3.80–5.20)
RDW: 13.3 % (ref 11.5–14.5)
WBC: 8.3 10*3/uL (ref 3.6–11.0)

## 2016-05-06 LAB — MAGNESIUM: Magnesium: 2.1 mg/dL (ref 1.7–2.4)

## 2016-05-06 LAB — COMPREHENSIVE METABOLIC PANEL
ALBUMIN: 4.5 g/dL (ref 3.5–5.0)
ALK PHOS: 86 U/L (ref 38–126)
ALT: 27 U/L (ref 14–54)
AST: 23 U/L (ref 15–41)
Anion gap: 8 (ref 5–15)
BILIRUBIN TOTAL: 0.4 mg/dL (ref 0.3–1.2)
BUN: 17 mg/dL (ref 6–20)
CALCIUM: 9.3 mg/dL (ref 8.9–10.3)
CO2: 28 mmol/L (ref 22–32)
CREATININE: 0.96 mg/dL (ref 0.44–1.00)
Chloride: 100 mmol/L — ABNORMAL LOW (ref 101–111)
GFR calc Af Amer: >60 mL/min (ref >60)
GLUCOSE: 93 mg/dL (ref 65–99)
Potassium: 3.9 mmol/L (ref 3.5–5.1)
Sodium: 136 mmol/L (ref 135–145)
TOTAL PROTEIN: 7.7 g/dL (ref 6.5–8.1)

## 2016-05-06 LAB — PROTIME-INR
INR: 0.93
Prothrombin Time: 12.5 seconds (ref 11.4–15.2)

## 2016-05-06 LAB — SEDIMENTATION RATE: Sed Rate: 2 mm/hr (ref 0–20)

## 2016-05-06 LAB — PLATELET FUNCTION ASSAY: COLLAGEN / EPINEPHRINE: 77 s (ref 0–193)

## 2016-05-06 LAB — VITAMIN B12: Vitamin B-12: 319 pg/mL (ref 180–914)

## 2016-05-06 LAB — APTT: APTT: 29 s (ref 24–36)

## 2016-05-06 LAB — C-REACTIVE PROTEIN

## 2016-05-06 NOTE — Progress Notes (Signed)
Safety precautions to be maintained throughout the outpatient stay will include: orient to surroundings, keep bed in low position, maintain call bell within reach at all times, provide assistance with transfer out of bed and ambulation.  

## 2016-05-06 NOTE — Progress Notes (Signed)
Patient's Name: Shelly Wright  MRN: [id number]  Referring Provider: Herminio Commons, MD  DOB: 26-Jul-1970  PCP: [facility]  DOS: 05/06/2016  Note by: Kathlen Brunswick. Dossie Arbour, MD  Service setting: Ambulatory outpatient  Specialty: Interventional Pain Management  Location: [facility] (AMB) Pain Management Facility    Patient type: New Patient   Primary Reason(s) for Visit: Initial Patient Evaluation CC: Back Pain (lower); Hip Pain (bilateral, left is worse.); and Knee Pain (bilateral.  s/p joint replacement in right. )  HPI  Shelly Wright is a 45 y.o. year old, female patient, who comes today for an initial evaluation. She has Status post right knee replacement; Anxiety and depression; Airway hyperreactivity; Bipolar 1 disorder ([place]); Cardiac conduction disorder; Cervical spinal cord compression ([place]); Chronic pain syndrome; DDD (degenerative disc disease), lumbar; Soft tissue lesion of shoulder region; Fibromyalgia; Personal history of perinatal problems; BP (high blood pressure); Irregular cardiac rhythm; Chronic low back pain (Location of Secondary source of pain) (Bilateral) (L>R); Neuritis or radiculitis due to rupture of lumbar intervertebral disc; Lumbar canal stenosis; Depression, major, single episode; Extreme obesity ([place]); Arthralgia of shoulder; Cervical post-laminectomy syndrome; Primary osteoarthritis of one knee, left; Hypercholesterolemia without hypertriglyceridemia; Breathlessness on exertion; Thoracic neuritis; Thoracic and lumbosacral neuritis; Avitaminosis D; Long term current use of anticoagulant therapy (Lovenox); Long term current use of opiate analgesic; Long term prescription opiate use; Opiate use; Opioid dependence, uncomplicated ([place]); Primary osteoarthritis of one knee, right; Primary osteoarthritis of both knees; Periprosthetic fracture around prosthetic knee; Radiculopathy of cervical region; Chronic shoulder pain (Location of Tertiary source of pain) (Bilateral)  (R>L); Status post revision of total replacement of right knee; Status post total right knee replacement not using cement; Chronic hip pain (Location of Primary Source of Pain) (Bilateral) (L>R); Chronic neck pain (Bilateral) (L>R); and Chronic knee pain (Bilateral) (L>R) on her problem list.. Her primarily concern today is the Back Pain (lower); Hip Pain (bilateral, left is worse.); and Knee Pain (bilateral.  s/p joint replacement in right. )  Pain Assessment: Self-Reported Pain Score: 4 /10             Reported level is compatible with observation.       Pain Type: Chronic pain Pain Location: Back (knee) Pain Orientation: Lower, Left (bilateral knee, s/p joint replacement on the right) Pain Descriptors / Indicators: Aching, Sharp, Radiating, Constant (knees are achey) Pain Frequency: Constant  Onset and Duration: Sudden and Present longer than 3 months Cause of pain: Unknown Severity: No change since onset, NAS-11 at its worse: 10/10, NAS-11 at its best: 5/10, NAS-11 now: 5/10 and NAS-11 on the average: 5/10 Timing: Morning, Afternoon, Night, During activity or exercise and After activity or exercise Aggravating Factors: Bending, Kneeling, Lifiting, Prolonged standing, Twisting and Walking Alleviating Factors: Lying down, Resting and Sitting Associated Problems: Depression, Fatigue, Inability to concentrate, Inability to control bladder (urine), Personality changes, Sadness, Tingling, Weakness and Pain that wakes patient up Quality of Pain: Aching, Annoying, Cramping, Deep, Feeling of weight, Pressure-like, Sharp, Shooting, Stabbing, Tender and Uncomfortable Previous Examinations or Tests: X-rays Previous Treatments: TENS  The patient comes into the clinics today for the first time for a chronic pain management evaluation. According to the patient her primary pain is that of the hips with the left being worst on the right. She denies any prior hip surgery or nerve blocks. She also denies any  physical therapy to help with this hip pain. The second worst pain is that of the lower back with most  of the pain originating in the center but then radiating bilaterally with the left side being worst on the right she denies any prior surgeries but does admit having had several nerve blocks at the [facility] pain clinic where she had the nerves "burned". She also denies having done any physical therapy for the lower back. The next worst pain is that of the shoulders with the right being worst on the left. No surgeries or prior nerve blocks. Next is the neck pain which is described to be in the posterior aspect of the neck radiating bilaterally with the left being worst on the right. The patient indicates having had 2 cervical spine surgeries to first 1 being an ACDF done on 2012 and the second one a posterior cervical fusion done on 2014. According to the patient both surgeries were done at [facility] by an orthopedic surgeon. Or regionally she had surgery done for arm pain on the right side which did improve after the surgery. She denies having had any nerve blocks or physical therapy for the neck problems. Following is the knee pain which is also bilateral but with the left knee being worst on the right. The patient indicates having had a total knee replacement on 2016 done by Dr. Little Ishikawa. She still has pain on that right knee over the anterior portion of the knee. In the case of the left knee, he indicates having had an arthroscopy done by Dr. Mauri Pole in the 1990s. This seems to have helped for a while but now she is again experiencing some more pain in the anterior aspect of the knee. She denies any nerve blocks or physical therapy for this knee pain.  When asked about the medications that she takes for the pain she indicates taking gabapentin 900 mg by mouth 3 times a day and diclofenac 75 mg by mouth twice a day. The physical exam today was positive for left hip pain and bilateral lumbar facet pain.  In addition to this, the patient is clearly morbidly obese which is contributing to her lumbar, hip, and knee osteoarthritis.  Today I took the time to provide the patient with information regarding my pain practice. The patient was informed that my practice is divided into two sections: an interventional pain management section, as well as a completely separate and distinct medication management section. The interventional portion of my practice takes place on Tuesdays and Thursdays, while the medication management is conducted on Mondays and Wednesdays. Because of the amount of documentation required on both them, they are kept separated. This means that there is the possibility that the patient Wright be scheduled for a procedure on Tuesday, while also having a medication management appointment on Wednesday. I have also informed the patient that because of current staffing and facility limitations, I no longer take patients for medication management only. To illustrate the reasons for this, I gave the patient the example of a surgeon and how inappropriate it would be to refer a patient to his/her practice so that they write for the post-procedure antibiotics on a surgery done by someone else.   The patient was informed that joining my practice means that they are open to any and all interventional therapies. I clarified for the patient that this does not mean that they will be forced to have any procedures done. What it means is that patients looking for a practitioner to simply write for their pain medications and not take advantage of other interventional techniques will be  better served by a different practitioner, other than myself. I made it clear that I prefer to spend my time providing those services that I specialize in.  The patient was also made aware of my Comprehensive Pain Management Safety Guidelines where by joining my practice, they limit all of their nerve blocks and joint injections to those  done by our practice, for as long as we are retained to manage their care.   Historic Controlled Substance Pharmacotherapy Review  PMP and historical list of controlled substances: Hydrocodone/APAP 5/325 one twice a day; oxycodone/APAP 5/325 one 3 times a day; oxycodone IR 5 mg; oxycodone IR 10 mg; tramadol 50 mg; hydromorphone 4 mg; alprazolam 1 mg; Opana ER 20 mg; Lyrica 75 mg; Soma 350 mg; morphine ER 30 mg. Highest analgesic regimen found: Hydromorphone 4 mg (12 per day) Most recent analgesic: Hydrocodone/APAP 5/325 one twice a day (failed done 08/15/2015). The patient is currently not taking any opioids. Highest recorded MME/day: 192 mg/day MME/day: 0 mg/day Medications: The patient did not bring the medication(s) to the appointment, as requested in our "New Patient Package" Historical Monitoring: The patient  reports that she does not use drugs.. No results found for: MDMA, COCAINSCRNUR, PCPSCRNUR, THCU, ETH Historical Background Evaluation: [place] PDMP: Six (6) year initial data search conducted. Abnormal pattern detected. Regular monthly controlled substance intake starting before 04/22/2010 until 08/15/2015. [place] Department of public safety, offender search: Editor, commissioning Information) Non-contributory Risk Assessment Profile: Aberrant behavior: use of illicit substances. History of marijuana use. Risk factors for fatal opioid overdose: Concomitant use of Benzodiazepines and Caucasian Fatal overdose hazard ratio (HR): Calculation deferred Non-fatal overdose hazard ratio (HR): Calculation deferred Risk of opioid abuse or dependence: 0.7-3.0% with doses ? 36 MME/day and 6.1-26% with doses ? 120 MME/day. Substance use disorder (SUD) risk level: Pending results of Medical Psychology Evaluation for SUD Opioid risk tool (ORT) (Total Score): 7  ORT Scoring interpretation table:  Score <3 = Low Risk for SUD  Score between 4-7 = Moderate Risk for SUD  Score >8 = High Risk for Opioid Abuse   PHQ-2  Depression Scale:  Total score: 0  PHQ-2 Scoring interpretation table: (Score and probability of major depressive disorder)  Score 0 = No depression  Score 1 = 15.4% Probability  Score 2 = 21.1% Probability  Score 3 = 38.4% Probability  Score 4 = 45.5% Probability  Score 5 = 56.4% Probability  Score 6 = 78.6% Probability   PHQ-9 Depression Scale:  Total score: 0  PHQ-9 Scoring interpretation table:  Score 0-4 = No depression  Score 5-9 = Mild depression  Score 10-14 = Moderate depression  Score 15-19 = Moderately severe depression  Score 20-27 = Severe depression (2.4 times higher risk of SUD and 2.89 times higher risk of overuse)   Pharmacologic Plan: Pending ordered tests and/or consults  Meds  The patient has a current medication list which includes the following prescription(s): aripiprazole, diclofenac, gabapentin, hydrochlorothiazide, hydroxyzine, simvastatin, tizanidine, trazodone, venlafaxine xr, vitamin d3, diclofenac, enoxaparin, gabapentin, hydrochlorothiazide, hydroxyzine, oxycodone, tizanidine, and venlafaxine xr.  Current Outpatient Prescriptions on File Prior to Visit  Medication Sig  . ARIPiprazole (ABILIFY) 10 MG tablet Take 10 mg by mouth daily.  . diclofenac (VOLTAREN) 75 MG EC tablet Take 75 mg by mouth 2 (two) times daily.  Marland Kitchen gabapentin (NEURONTIN) 600 MG tablet Take 900 mg by mouth 2 (two) times daily.  . hydrOXYzine (ATARAX/VISTARIL) 50 MG tablet Take 50 mg by mouth 3 (three) times daily.  . simvastatin (  ZOCOR) 20 MG tablet Take 20 mg by mouth daily at 6 PM.  . tiZANidine (ZANAFLEX) 4 MG tablet Take 4 mg by mouth 3 (three) times daily.  . Cholecalciferol (VITAMIN D3) 5000 UNITS CAPS Take 1 capsule by mouth once a week.  . enoxaparin (LOVENOX) 40 MG/0.4ML injection Inject 0.4 mLs (40 mg total) into the skin every 12 (twelve) hours. (Patient not taking: Reported on 05/06/2016)  . hydrochlorothiazide (MICROZIDE) 12.5 MG capsule Take 12.5 mg by mouth daily.  Marland Kitchen  oxyCODONE (OXY IR/ROXICODONE) 5 MG immediate release tablet Take 1-2 tablets (5-10 mg total) by mouth every 3 (three) hours as needed for breakthrough pain. (Patient not taking: Reported on 05/06/2016)  . venlafaxine XR (EFFEXOR-XR) 75 MG 24 hr capsule Take 225 mg by mouth daily with breakfast.   No current facility-administered medications on file prior to visit.    Imaging Review  Cervical Imaging: Cervical MR wo contrast:  Results for orders placed in visit on 07/20/08  MR C Spine Ltd W/O Cm   Narrative * PRIOR REPORT IMPORTED FROM AN EXTERNAL SYSTEM *   PRIOR REPORT IMPORTED FROM THE [facility] [place] EXAM:    neck pain  COMMENTS:  [id number]   PROCEDURE:     MR  - MR CERVICAL SPINE WO CONT  - Jul 20 2008  4:02PM   RESULT:     History: Neck pain   Comparison studies: None   Procedure and Findings: Multiplanar ,multisequence imaging of the cervical  spine is obtained. Left paracentral disc protrusion is present at C4-C5.  This results in mild cervical cord deformity. Left paracentral a prominent  disc protrusion is noted at C5-C6 with deformity of the of cervical cord  at  this level. No bony abnormality identified. Cervical cord is normal. A  vertebral junction normal.   IMPRESSION:     1. Left paracentral small disc protrusion C4-C5.  2. Prominent left paracentral C5-C6 disc protrusion.       Shoulder Imaging: Shoulder-L MR wo contrast:  Results for orders placed in visit on 12/16/07  MR Shoulder Left Wo Contrast   Narrative * PRIOR REPORT IMPORTED FROM AN EXTERNAL SYSTEM *   PRIOR REPORT IMPORTED FROM THE [facility] WORKFLOW SYSTEM   REASON FOR EXAM:    shoulder, back pain  COMMENTS:   PROCEDURE:     MR  - MR SHOULDER LT  WO CONTRAST  - Dec 16 2007 10:13AM   RESULT:     Multiplanar and multisequence imaging of the LEFT shoulder was  obtained without the administration gadolinium.   The biceps tendon is appreciated along the bicipital groove.  The   cartilaginous glenoid labrum appears to be grossly unremarkable.  Examination of the osseous structures reveals degenerative change  involving  the acromioclavicular joint. There is marrow edema and pannus formation.  Mass-effect upon the supraspinatus musculature and tendon is identified.  The  humerus demonstrate areas of mild osteophytosis. Evaluation of the  supraspinatus tendon demonstrates a focal area of increased T2 signal just  distal to the musculotendinous junction. This appears to traverse the  tendon  and is concerning for an area of high-grade partial tearing versus  full-thickness tearing without retraction. The remaining components of the  rotator cuff appear to be intact.   IMPRESSION:  1.     Findings consistent with a high-grade partial tear versus  full-thickness tearing without retraction of the supraspinatus tendon.   This  is just distal to the  musculotendinous junction.  2.     Impingement anatomy involving the acromioclavicular joint and mild  degenerative changes involving the humeral head.   Thank you for the opportunity to contribute to the care of your patient.       Lumbosacral Imaging: Lumbar MR wo contrast:  Results for orders placed in visit on 01/18/10  MR L Spine Ltd W/O Cm   Narrative * PRIOR REPORT IMPORTED FROM AN EXTERNAL SYSTEM *   PRIOR REPORT IMPORTED FROM THE SY[facility] [place] EXAM:    back pain  COMMENTS:   PROCEDURE:     MMR - MMR LUMBAR SPINE WO CONTRAST  - Jan 18 2010  3:13PM   RESULT:     MRI LUMBAR SPINE WITHOUT CONTRAST   HISTORY: Back pain   COMPARISON: 12/16/2007   TECHNIQUE: Multiplanar and multisequence MRI of the lumbar spine were  obtained, without administration of IV contrast.   FINDINGS:   The vertebral bodies of the lumbar spine are normal in size and alignment.  There is normal bone marrow signal demonstrated throughout the vertebra.  There is disc desiccation at L2-L3, L3-L4 and L4-L5.    The spinal cord is of normal volume and contour. The cord terminates  normally at L1 . The nerve roots of the cauda equina and the filum  terminale  have the usual appearance.   The visualized portions of the SI joints are unremarkable.   The imaged intra-abdominal contents are unremarkable.   T12-L1: No significant disc bulge. No evidence of neural foraminal or  central stenosis.   L1-L2: No significant disc bulge. No evidence of neural foraminal or  central  stenosis.   L2-L3: Mild broad-based disc bulge. No evidence of neural foraminal or  central stenosis.   L3-L4: Moderate broad-based disc bulge with flattening of the ventral  thecal  sac. There is moderate-severe spinal stenosis at L3-L4. No evidence of  neural foraminal stenosis.   L4-L5: Mild broad-based disc bulge with flattening of the ventral thecal  sac. Moderate bilateral facet arthropathy with ligamentum flavum infolding  resulting in moderate-severe spinal stenosis. No evidence of neural  foraminal stenosis.   L5-S1: No significant disc bulge. No evidence of neural foraminal or  central  stenosis. Mild bilateral facet arthropathy.   IMPRESSION:   1. Lumbar spine spondylosis as described above.       Knee Imaging: Knee-L MR w contrast:  Results for orders placed in visit on 12/01/06  MR Knee Left  Wo Contrast   Narrative * PRIOR REPORT IMPORTED FROM AN EXTERNAL SYSTEM *   PRIOR REPORT IMPORTED FROM THE [facility] WORKFLOW SYSTEM   REASON FOR EXAM:    left knee pain    eval for tears  COMMENTS:   PROCEDURE:     MR  - MR KNEE LT  WO CONTRAST  - Dec 01 2006 10:23AM   RESULT:     Comparison: Left knee radiographs on 11/25/2006.   Technique: Multiplanar, multisequence MR examination of the left knee was  performed without gadolinium contrast.   Findings:   There is a moderate to large knee effusion. Stranding is noted in the  suprapatellar recess, suggestive of synovitis. There is a complex tear   involving the posterior horn medial meniscus with extension into the body.  The lateral meniscus is unremarkable. The ACL and PCL are intact. The  extensor mechanism and collateral ligaments are unremarkable.   No fracture is noted. There is significant cartilage thinning  involving  the  medial compartment with significant cartilage irregularities. There are  small areas of near total cartilage loss. Mild cartilage irregularities  are  also seen involving the lateral and patellofemoral compartments. Small  osteophytes are seen involving the medial and lateral compartments. There  is  no Baker's cyst.   IMPRESSION:   1. There is a complex tear involving the posterior horn medial meniscus  with  extension into the body.  2. Cartilage abnormalities most prominently involving the medial  compartment  are as described above.  3. Moderate to large knee effusion with changes of synovitis.   Thank you for this opportunity to contribute to the care of your patient.       Knee-R MR wo contrast:  Results for orders placed in visit on 01/18/10  MR Knee Right Wo Contrast   Narrative * PRIOR REPORT IMPORTED FROM AN EXTERNAL SYSTEM *   PRIOR REPORT IMPORTED FROM THE [facility] WORKFLOW SYSTEM   REASON FOR EXAM:    back pain  COMMENTS:   PROCEDURE:     MMR - MMR KNEE RT WO CONTRAST  - Jan 18 2010  3:49PM   RESULT:     History: Pain   Comparison: None   Technique: Multiplanar and multisequence MRI of the right knee was  performed  without IV contrast.   Findings:   There is a complex tear of the posterior horn and body of the medial  meniscus with a radial component and peripheral meniscal extrusion.   There is severe attenuation of the lateral meniscus most concerning for a  tear.   There is thickening of the posterior cruciate ligament most concerning for  prior injury.   The anterior cruciate ligament is intact.   The medial collateral ligament and lateral collateral  ligamentous complex  are intact.  The popliteus tendon is intact.   The retinacular complex is intact. The extensor mechanism is intact.   There is cartilage irregularity with partial thickness cartilage loss  involving the medial femoral condyle and medial tibial plateau. There is  full-thickness cartilage loss of the lateral femoral condyle and lateral  tibial plateau. There are tricompartmental marginal osteophytes.   There is no Baker's cyst. There is no abnormal signal within Hoffa's fat.  There is no plical thickening. There is no bursal abnormality. There is no  abnormal bone marrow signal. There is no significant joint effusion.   IMPRESSION:   1. There is a complex tear of the posterior horn and body of the medial  meniscus with a radial component and peripheral meniscal extrusion.   2. There is severe attenuation of the lateral meniscus most concerning for  a  tear.   3. Cartilage abnormalities as described above.       Note: Available results from prior imaging studies were reviewed.        ROS  Cardiovascular History: Abnormal heart rhythm and Hypertension Pulmonary or Respiratory History: Smoker, Snoring  and Bronchitis Neurological History: Scoliosis Review of Past Neurological Studies: No results found for this or any previous visit. Psychological-Psychiatric History: Anxiety, Depression and Panic Attacks Gastrointestinal History: Negative for peptic ulcer disease, hiatal hernia, GERD, IBS, hepatitis, cirrhosis or pancreatitis Genitourinary History: Negative for nephrolithiasis, hematuria, renal failure or chronic kidney disease Hematological History: Negative for anticoagulant therapy, anemia, bruising or bleeding easily, hemophilia, sickle cell disease or trait, thrombocytopenia or coagulupathies Endocrine History: Negative for diabetes or thyroid disease Rheumatologic History: Fibromyalgia Musculoskeletal History: Negative for myasthenia gravis, muscular  dystrophy, multiple sclerosis or malignant hyperthermia Work History: Out of work due to pain  Allergies  Shelly Wright has No Known Allergies.  Laboratory Chemistry  Inflammation Markers Lab Results  Component Value Date   ESRSEDRATE 2 05/06/2016   CRP <0.80 05/06/2016   Renal Function Lab Results  Component Value Date   BUN 17 05/06/2016   CREATININE 0.96 05/06/2016   GFRAA >60 05/06/2016   GFRNONAA >60 05/06/2016   Hepatic Function Lab Results  Component Value Date   AST 23 05/06/2016   ALT 27 05/06/2016   ALBUMIN 4.5 05/06/2016   Electrolytes Lab Results  Component Value Date   NA 136 05/06/2016   K 3.9 05/06/2016   CL 100 (L) 05/06/2016   CALCIUM 9.3 05/06/2016   MG 2.1 05/06/2016   Pain Modulating Vitamins Lab Results  Component Value Date   VITAMINB12 319 05/06/2016   Coagulation Parameters Lab Results  Component Value Date   INR 0.93 05/06/2016   LABPROT 12.5 05/06/2016   APTT 29 05/06/2016   PLT 287 05/06/2016   Cardiovascular Lab Results  Component Value Date   HGB 15.4 05/06/2016   HCT 45.5 05/06/2016   Note: Lab results reviewed.  PFSH  Drug: Shelly Wright  reports that she does not use drugs. Alcohol:  reports that she does not drink alcohol. Tobacco:  reports that she quit smoking about 3 years ago. Her smoking use included Cigarettes. She smoked 1.00 pack per day. She has never used smokeless tobacco. Medical:  has a past medical history of Anxiety; Arthritis; Asthma; Bipolar disorder ([place]); DDD (degenerative disc disease), lumbar; Depression; Fibromyalgia; Hypertension; and Restless leg syndrome. Family: family history includes Breast cancer in her mother; Colon cancer in her father; Heart attack in her father.  Past Surgical History:  Procedure Laterality Date  . ABDOMINAL HYSTERECTOMY    . Cervical Fusion X 2    . JOINT REPLACEMENT Right 01/2016   Dr Jefm Bryant  . KNEE ARTHROSCOPY Bilateral    Right knee scope 1998, Left knee scope  .  TONSILLECTOMY    . TOTAL KNEE ARTHROPLASTY Right 02/01/2015   Procedure: TOTAL KNEE ARTHROPLASTY;  Surgeon: Leanor Kail, MD;  Location: [facility] ORS;  Service: Orthopedics;  Laterality: Right;  . TUBAL LIGATION     Active Ambulatory Problems    Diagnosis Date Noted  . 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 Anxiety and depression 04/25/2014  . Airway hyperreactivity 05/06/2016  . Bipolar 1 disorder ([place]) 05/06/2016  . Cardiac conduction disorder 03/26/2014  . Cervical spinal cord compression ([place]) 05/07/2012  . Chronic pain syndrome 12/30/2013  . DDD (degenerative disc disease), lumbar 08/30/2014  . Soft tissue lesion of shoulder region 01/24/2012  . Fibromyalgia 04/25/2014  . Personal history of perinatal problems 04/25/2014  . BP (high blood pressure) 05/06/2016  . Irregular cardiac rhythm 05/06/2016  . Chronic low back pain (Location of Secondary source of pain) (Bilateral) (L>R) 04/25/2014  . Neuritis or radiculitis due to rupture of lumbar intervertebral disc 05/23/2014  . Lumbar canal stenosis 05/23/2014  . Depression, major, single episode 05/08/2013  . Extreme obesity ([place]) 05/06/2016  . Arthralgia of shoulder 01/24/2012  . Cervical post-laminectomy syndrome 12/30/2013  . Primary osteoarthritis of one knee, left 11/17/2014  . Hypercholesterolemia without hypertriglyceridemia 03/26/2014  . Breathlessness on exertion 04/20/2014  . Thoracic neuritis 05/07/2012  . Thoracic and lumbosacral neuritis 05/07/2012  . Avitaminosis D 04/25/2014  . Long term current use of anticoagulant therapy (Lovenox) 05/06/2016  . Long term  current use of opiate analgesic 05/06/2016  . Long term prescription opiate use 05/06/2016  . Opiate use 05/06/2016  . Opioid dependence, uncomplicated ([place]) 42/87/6811  . Primary osteoarthritis of one knee, right 05/06/2016  . Primary osteoarthritis of both knees 05/06/2016  . Periprosthetic fracture around prosthetic knee 07/31/2015  .  Radiculopathy of cervical region 12/21/2015  . Chronic shoulder pain (Location of Tertiary source of pain) (Bilateral) (R>L) 12/21/2015  . 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 Chronic hip pain (Location of Primary Source of Pain) (Bilateral) (L>R) 05/06/2016  . Chronic neck pain (Bilateral) (L>R) 05/06/2016  . Chronic knee pain (Bilateral) (L>R) 05/06/2016   Resolved Ambulatory Problems    Diagnosis Date Noted  . No Resolved Ambulatory Problems   Past Medical History:  Diagnosis Date  . Anxiety   . Arthritis   . Asthma   . Bipolar disorder ([place])   . DDD (degenerative disc disease), lumbar   . Depression   . Fibromyalgia   . Hypertension   . Restless leg syndrome    Constitutional Exam  General appearance: Well nourished, well developed, and well hydrated. In no apparent acute distress Vitals:   05/06/16 1138  BP: 140/85  Pulse: 76  Resp: 16  Temp: 98.3 F (36.8 C)  TempSrc: Oral  SpO2: 98%  Weight: 290 lb (131.5 kg)  Height: '5\' 6"'  (1.676 m)   BMI Assessment: Estimated body mass index is 46.81 kg/m as calculated from the following:   Height as of this encounter: '5\' 6"'  (1.676 m).   Weight as of this encounter: 290 lb (131.5 kg).  BMI interpretation table: BMI level Category Range association with higher incidence of chronic pain  <18 kg/m2 Underweight   18.5-24.9 kg/m2 Ideal body weight   25-29.9 kg/m2 Overweight Increased incidence by 20%  30-34.9 kg/m2 Obese (Class I) Increased incidence by 68%  35-39.9 kg/m2 Severe obesity (Class II) Increased incidence by 136%  >40 kg/m2 Extreme obesity (Class III) Increased incidence by 254%   BMI Readings from Last 4 Encounters:  05/06/16 46.81 kg/m  02/01/15 48.75 kg/m  01/24/15 48.75 kg/m   Wt Readings from Last 4 Encounters:  05/06/16 290 lb (131.5 kg)  02/01/15 284 lb (128.8 kg)  01/24/15 284 lb (128.8 kg)   Psych/Mental status: Alert, oriented x 3 (person, place, & time) Eyes: PERLA Respiratory: No evidence of acute respiratory distress  Cervical Spine Exam  Inspection: No masses, redness, or swelling Alignment: Symmetrical Functional ROM: Decreased ROM Stability: No instability detected Muscle strength & Tone: Functionally intact Sensory: Unimpaired Palpation: Non-contributory  Upper Extremity (UE) Exam    Side: Right upper extremity  Side: Left upper extremity  Inspection: No masses, redness, swelling, or asymmetry  Inspection: No masses, redness, swelling, or asymmetry  Functional ROM: Unrestricted ROM          Functional ROM: Unrestricted ROM          Muscle strength & Tone: Functionally intact  Muscle strength & Tone: Functionally intact  Sensory: Unimpaired  Sensory: Unimpaired  Palpation: Non-contributory  Palpation: Non-contributory   Thoracic Spine Exam  Inspection: No masses, redness, or swelling Alignment: Symmetrical Functional ROM: Unrestricted ROM Stability: No instability detected Sensory: Unimpaired Muscle strength & Tone: Functionally intact Palpation: Non-contributory  Lumbar Spine Exam  Inspection: No masses, redness, or swelling Alignment: Symmetrical Functional ROM: Decreased ROM Stability: No instability detected Muscle strength & Tone: Functionally intact  Sensory: Movement-associated pain Palpation: Complains of area being tender to palpation Provocative Tests: Lumbar Hyperextension and rotation test: Positive bilaterally for facet joint pain. Patrick's Maneuver: Positive       for left hip joint pain.  Gait & Posture Assessment  Ambulation: Unassisted Gait: Relatively normal for age and body habitus Posture: WNL   Lower Extremity Exam    Side: Right lower extremity  Side: Left lower extremity  Inspection: No masses, redness, swelling, or asymmetry  Inspection: No masses, redness, swelling, or asymmetry  Functional ROM: Unrestricted ROM           Functional ROM: Unrestricted ROM          Muscle strength & Tone: Able to Toe-walk & Heel-walk without problems  Muscle strength & Tone: Able to Toe-walk & Heel-walk without problems  Sensory: Unimpaired  Sensory: Unimpaired  Palpation: Non-contributory  Palpation: Non-contributory   Assessment  Primary Diagnosis & Pertinent Problem List: The primary encounter diagnosis was Chronic pain syndrome. Diagnoses of Primary osteoarthritis of one knee, right, Primary osteoarthritis of both knees, Radiculopathy of cervical region, Long term current use of anticoagulant therapy (Lovenox), Long term current use of opiate analgesic, Long term prescription opiate use, Opiate use, Uncomplicated opioid dependence ([place]), Neuritis or radiculitis due to rupture of lumbar intervertebral disc, Spinal stenosis of lumbar region with neurogenic claudication, Chronic hip pain (Location of Primary Source of Pain) (Bilateral) (L>R), Chronic low back pain (Location of Secondary source of pain) (Bilateral) (L>R), Chronic shoulder pain (Location of Tertiary source of pain) (Bilateral) (R>L), Chronic neck pain (Bilateral) (L>R), and Chronic knee pain (Bilateral) (L>R) were also pertinent to this visit.  Visit Diagnosis: 1. Chronic pain syndrome   2. Primary osteoarthritis of one knee, right   3. Primary osteoarthritis of both knees   4. Radiculopathy of cervical region   5. Long term current use of anticoagulant therapy (Lovenox)   6. Long term current use of opiate analgesic   7. Long term prescription opiate use   8. Opiate use   9. Uncomplicated opioid dependence ([place])   10. Neuritis or radiculitis due to rupture of lumbar intervertebral disc   11. Spinal stenosis of lumbar region with neurogenic claudication   12. Chronic hip pain (Location of Primary Source of Pain) (Bilateral) (L>R)   13. Chronic low back pain (Location of Secondary source of pain) (Bilateral) (L>R)   14. Chronic shoulder pain (Location of Tertiary  source of pain) (Bilateral) (R>L)   15. Chronic neck pain (Bilateral) (L>R)   16. Chronic knee pain (Bilateral) (L>R)    Plan of Care  Initial treatment plan:  Please be advised that as per protocol, today's visit has been an evaluation only. We have not taken over the patient's controlled substance management.  Problem-specific plan: No problem-specific Assessment & Plan notes found for this encounter.  Ordered Lab-work, Procedure(s), Referral(s), & Consult(s): Orders Placed This Encounter  Procedures  . Compliance Drug Analysis, Ur  . Comprehensive metabolic panel  . C-reactive protein  . Magnesium  . Sedimentation rate  . Vitamin B12  . 25-Hydroxyvitamin D Lcms D2+D3  . Protime-INR  . APTT  . CBC  . Platelet count  . Platelet function assay  . Ambulatory referral to Psychology   Pharmacotherapy: Medications ordered:  No orders of the defined types were placed in this encounter.  Medications administered during this visit: Shelly Wright had no medications administered during this visit.   Pharmacotherapy under consideration:  Opioid Analgesics: The patient was informed that  there is no guarantee that she would be a candidate for opioid analgesics. The decision will be made following CDC guidelines. This decision will be based on the results of diagnostic studies, as well as Shelly Wright's risk profile.  Membrane stabilizer: To be determined at a later time Muscle relaxant: To be determined at a later time NSAID: To be determined at a later time Other analgesic(s): To be determined at a later time   Interventional therapies under consideration: Shelly Wright was informed that there is no guarantee that she would be a candidate for interventional therapies. The decision will be based on the results of diagnostic studies, as well as Shelly Wright's risk profile.  Possible procedure(s): Diagnostic bilateral lumbar facet block under fluoroscopic guidance and IV sedation.  Diagnostic left  intra-articular hip joint injection  Diagnostic bilateral genicular nerve blocks under fluoroscopic guidance  Possible bilateral genicular nerve radiofrequency ablation Diagnostic left intra-articular knee joint injection  Possible series of 5 [place] intra-articular left knee joint injections    Provider-requested follow-up: Return for 2nd Visit, after [facility] evaluation.  No future appointments.  Primary Care Physician: [facility] Location: [facility] Facility Note by: Kathlen Brunswick. Dossie Arbour, M.D, DABA, DABAPM, DABPM, DABIPP, FIPP Date: 05/06/16; Time: 5:54 PM  Pain Score Disclaimer: We use the NRS-11 scale. This is a self-reported, subjective measurement of pain severity with only modest accuracy. It is used primarily to identify changes within a particular patient. It must be understood that outpatient pain scales are significantly less accurate that those used for research, where they can be applied under ideal controlled circumstances with minimal exposure to variables. In reality, the score is likely to be a combination of pain intensity and pain affect, where pain affect describes the degree of emotional arousal or changes in action readiness caused by the sensory experience of pain. Factors such as social and work situation, setting, emotional state, anxiety levels, expectation, and prior pain experience [place] influence pain perception and show large inter-individual differences that [place] also be affected by time variables.  Patient instructions provided during this appointment: There are no Patient Instructions on file for this visit.

## 2016-05-08 ENCOUNTER — Other Ambulatory Visit: Payer: Self-pay | Admitting: Pain Medicine

## 2016-05-09 LAB — 25-HYDROXY VITAMIN D LCMS D2+D3
25-Hydroxy, Vitamin D-2: 9.2 ng/mL
25-Hydroxy, Vitamin D-3: 47 ng/mL
25-Hydroxy, Vitamin D: 56 ng/mL

## 2016-05-16 LAB — COMPLIANCE DRUG ANALYSIS, UR

## 2016-05-16 LAB — PLEASE NOTE

## 2016-06-11 ENCOUNTER — Ambulatory Visit: Payer: BLUE CROSS/BLUE SHIELD | Admitting: Pain Medicine

## 2016-06-25 ENCOUNTER — Encounter: Payer: Self-pay | Admitting: Pain Medicine

## 2016-06-25 DIAGNOSIS — F1220 Cannabis dependence, uncomplicated: Secondary | ICD-10-CM | POA: Insufficient documentation

## 2016-06-25 DIAGNOSIS — F1290 Cannabis use, unspecified, uncomplicated: Secondary | ICD-10-CM | POA: Insufficient documentation

## 2016-06-25 NOTE — Progress Notes (Signed)
NOTE: This forensic urine drug screen (UDS) test was conducted using a state-of-the-art ultra high performance liquid chromatography and mass spectrometry system (UPLC/MS-MS), the most sophisticated and accurate method available. UPLC/MS-MS is 1,000 times more precise and accurate than standard gas chromatography and mass spectrometry (GC/MS). This system can analyze 26 drug categories and 180 drug compounds.  Positive, undisclosed use of illicit substance (Cannabinoids). Our program has a "Zero Tolerance" for the use of illicit substances. As a consequence of the results obtained on this test, we will no longer offer controlled substances as a therapeutic option.

## 2016-07-09 ENCOUNTER — Ambulatory Visit: Payer: BLUE CROSS/BLUE SHIELD | Attending: Pain Medicine | Admitting: Pain Medicine

## 2016-07-09 ENCOUNTER — Encounter: Payer: Self-pay | Admitting: Pain Medicine

## 2016-07-09 VITALS — BP 121/69 | HR 75 | Temp 98.6°F | Resp 16 | Ht 66.0 in | Wt 283.0 lb

## 2016-07-09 DIAGNOSIS — M1991 Primary osteoarthritis, unspecified site: Secondary | ICD-10-CM | POA: Diagnosis not present

## 2016-07-09 DIAGNOSIS — I493 Ventricular premature depolarization: Secondary | ICD-10-CM | POA: Insufficient documentation

## 2016-07-09 DIAGNOSIS — M25561 Pain in right knee: Secondary | ICD-10-CM | POA: Insufficient documentation

## 2016-07-09 DIAGNOSIS — M961 Postlaminectomy syndrome, not elsewhere classified: Secondary | ICD-10-CM | POA: Insufficient documentation

## 2016-07-09 DIAGNOSIS — M19012 Primary osteoarthritis, left shoulder: Secondary | ICD-10-CM | POA: Insufficient documentation

## 2016-07-09 DIAGNOSIS — M25559 Pain in unspecified hip: Secondary | ICD-10-CM

## 2016-07-09 DIAGNOSIS — Z6841 Body Mass Index (BMI) 40.0 and over, adult: Secondary | ICD-10-CM | POA: Diagnosis not present

## 2016-07-09 DIAGNOSIS — M1288 Other specific arthropathies, not elsewhere classified, other specified site: Secondary | ICD-10-CM

## 2016-07-09 DIAGNOSIS — M25551 Pain in right hip: Secondary | ICD-10-CM | POA: Diagnosis not present

## 2016-07-09 DIAGNOSIS — E668 Other obesity: Secondary | ICD-10-CM | POA: Insufficient documentation

## 2016-07-09 DIAGNOSIS — Z96651 Presence of right artificial knee joint: Secondary | ICD-10-CM | POA: Diagnosis not present

## 2016-07-09 DIAGNOSIS — F319 Bipolar disorder, unspecified: Secondary | ICD-10-CM | POA: Insufficient documentation

## 2016-07-09 DIAGNOSIS — M25511 Pain in right shoulder: Secondary | ICD-10-CM

## 2016-07-09 DIAGNOSIS — M19011 Primary osteoarthritis, right shoulder: Secondary | ICD-10-CM | POA: Diagnosis not present

## 2016-07-09 DIAGNOSIS — G8929 Other chronic pain: Secondary | ICD-10-CM

## 2016-07-09 DIAGNOSIS — M533 Sacrococcygeal disorders, not elsewhere classified: Secondary | ICD-10-CM | POA: Insufficient documentation

## 2016-07-09 DIAGNOSIS — Z87891 Personal history of nicotine dependence: Secondary | ICD-10-CM | POA: Insufficient documentation

## 2016-07-09 DIAGNOSIS — Z79899 Other long term (current) drug therapy: Secondary | ICD-10-CM | POA: Insufficient documentation

## 2016-07-09 DIAGNOSIS — M150 Primary generalized (osteo)arthritis: Secondary | ICD-10-CM

## 2016-07-09 DIAGNOSIS — E785 Hyperlipidemia, unspecified: Secondary | ICD-10-CM | POA: Insufficient documentation

## 2016-07-09 DIAGNOSIS — M545 Low back pain, unspecified: Secondary | ICD-10-CM

## 2016-07-09 DIAGNOSIS — G894 Chronic pain syndrome: Secondary | ICD-10-CM | POA: Diagnosis not present

## 2016-07-09 DIAGNOSIS — Z96659 Presence of unspecified artificial knee joint: Secondary | ICD-10-CM

## 2016-07-09 DIAGNOSIS — Z7901 Long term (current) use of anticoagulants: Secondary | ICD-10-CM | POA: Insufficient documentation

## 2016-07-09 DIAGNOSIS — M47816 Spondylosis without myelopathy or radiculopathy, lumbar region: Secondary | ICD-10-CM | POA: Insufficient documentation

## 2016-07-09 DIAGNOSIS — M25512 Pain in left shoulder: Secondary | ICD-10-CM

## 2016-07-09 DIAGNOSIS — M797 Fibromyalgia: Secondary | ICD-10-CM

## 2016-07-09 DIAGNOSIS — Z79891 Long term (current) use of opiate analgesic: Secondary | ICD-10-CM | POA: Diagnosis not present

## 2016-07-09 DIAGNOSIS — F1290 Cannabis use, unspecified, uncomplicated: Secondary | ICD-10-CM | POA: Insufficient documentation

## 2016-07-09 DIAGNOSIS — M25552 Pain in left hip: Secondary | ICD-10-CM | POA: Insufficient documentation

## 2016-07-09 DIAGNOSIS — M159 Polyosteoarthritis, unspecified: Secondary | ICD-10-CM | POA: Insufficient documentation

## 2016-07-09 DIAGNOSIS — T8484XA Pain due to internal orthopedic prosthetic devices, implants and grafts, initial encounter: Secondary | ICD-10-CM | POA: Insufficient documentation

## 2016-07-09 DIAGNOSIS — F419 Anxiety disorder, unspecified: Secondary | ICD-10-CM | POA: Insufficient documentation

## 2016-07-09 DIAGNOSIS — T8484XS Pain due to internal orthopedic prosthetic devices, implants and grafts, sequela: Secondary | ICD-10-CM

## 2016-07-09 DIAGNOSIS — E7800 Pure hypercholesterolemia, unspecified: Secondary | ICD-10-CM | POA: Insufficient documentation

## 2016-07-09 DIAGNOSIS — R55 Syncope and collapse: Secondary | ICD-10-CM | POA: Diagnosis not present

## 2016-07-09 DIAGNOSIS — M792 Neuralgia and neuritis, unspecified: Secondary | ICD-10-CM

## 2016-07-09 MED ORDER — TIZANIDINE HCL 4 MG PO TABS: 4.0000 mg | ORAL_TABLET | Freq: Four times a day (QID) | ORAL | 2 refills | Status: DC | PRN

## 2016-07-09 MED ORDER — GABAPENTIN 800 MG PO TABS: 800.0000 mg | ORAL_TABLET | Freq: Four times a day (QID) | ORAL | 2 refills | Status: DC

## 2016-07-09 MED ORDER — DICLOFENAC SODIUM 75 MG PO TBEC: 75.0000 mg | DELAYED_RELEASE_TABLET | Freq: Two times a day (BID) | ORAL | 2 refills | Status: DC

## 2016-07-09 NOTE — Progress Notes (Signed)
Patient's Name: Shelly Wright  MRN: [id number]  Referring Provider: [facility]  DOB: 04/15/1971  PCP: [facility]  DOS: 07/09/2016  Note by: Kathlen Brunswick. Dossie Arbour, MD  Service setting: Ambulatory outpatient  Specialty: Interventional Pain Management  Location: [facility] (AMB) Pain Management Facility    Patient type: Established   Primary Reason(s) for Visit: Encounter for evaluation before starting new chronic pain management plan of care (Level of risk: moderate) CC: Back Pain (lower); Hip Pain (both); Knee Pain (both); and Leg Pain (shin area)  HPI  Shelly Wright is a 46 y.o. year old, female patient, who comes today for a follow-up evaluation to review the test results and decide on a treatment plan. She has Anxiety and depression; Airway hyperreactivity; Bipolar 1 disorder ([place]); Cardiac conduction disorder; Chronic pain syndrome; DDD (degenerative disc disease), lumbar; Fibromyalgia; Personal history of perinatal problems; BP (high blood pressure); Irregular cardiac rhythm; Chronic low back pain (Location of Secondary source of pain) (Bilateral) (L>R); Lumbar canal stenosis (L3-4 and L4-5); Depression, major, single episode; Extreme obesity ([place]); Cervical post-laminectomy syndrome (Anterior-posterior approach) (2014); Osteoarthritis of knee (Left); Hypercholesterolemia without hypertriglyceridemia; Breathlessness on exertion; Avitaminosis D; Long term current use of anticoagulant therapy (Lovenox); Long term current use of opiate analgesic; Long term prescription opiate use; Opiate use; Opioid dependence, uncomplicated ([place]); Radiculopathy of cervical region (B)(R>L)(Thumb + Index fingers); Chronic shoulder pain (Location of Tertiary source of pain) (Bilateral) (L>R); S/P TKR Arthroplasty (Right); Chronic hip pain (Location of Primary Source of Pain) (Bilateral) (L>R); Chronic neck pain (Bilateral) (L>R); Chronic knee pain (Bilateral) (L>R); Marijuana use; 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 Her primarily concern today is the Back Pain (lower); Hip Pain (both); Knee Pain (both); and Leg Pain (shin area)  Pain Assessment: Self-Reported Pain Score: 2 /10             Reported level is compatible with observation.       Pain Type: Chronic pain Pain Location: Back Pain Orientation: Lower Pain Descriptors / Indicators: Aching, Constant Pain Frequency: Constant  Shelly Wright comes in today for a follow-up visit after her initial evaluation on 05/06/2016. Today we went over the results of her tests. These were explained in "Layman's terms". During today's appointment we went over my diagnostic impression, as well as the proposed treatment plan.  In considering the treatment plan options, Shelly Wright was reminded that I no longer take patients for medication management only. I asked her to let me know if she had no intention of taking advantage of the interventional therapies, so that we could make arrangements to provide this space to someone interested. I also made it clear that undergoing interventional therapies for the purpose of getting pain medications is very inappropriate on the part of a patient, and it will not be tolerated in this practice. This type of behavior would suggest true addiction and therefore it requires referral to an addiction specialist.   Further details on both, my assessment(s), as well as the proposed treatment plan, please see below. Controlled Substance Pharmacotherapy Assessment REMS (Risk Evaluation and Mitigation Strategy)  Analgesic: Hydrocodone/APAP 5/325 one twice a day  MME/day: 10 mg/day. Pill Count: None expected due to no  prior prescriptions written by our practice. Pharmacokinetics: Liberation and absorption (  onset of action): WNL Distribution (time to peak effect): WNL Metabolism and excretion (duration of action): WNL         Pharmacodynamics: Desired effects: Analgesia: Shelly Wright reports >50% benefit. Functional ability: Patient reports that medication allows her to accomplish basic ADLs Clinically meaningful improvement in function (CMIF): Sustained CMIF goals met Perceived effectiveness: Described as relatively effective, allowing for increase in activities of daily living (ADL) Undesirable effects: Side-effects or Adverse reactions: None reported Monitoring: [place] PMP: Online review of the past 59-monthperiod previously conducted. Not applicable at this point since we have not taken over the patient's medication management yet. List of all UDS test(s) done:  Lab Results  Component Value Date   SUMMARY FINAL 05/08/2016   Last UDS on record: Summary  Date Value Ref Range Status  05/08/2016 FINAL  Final    Comment:    ==================================================================== TOXASSURE COMP DRUG ANALYSIS,UR ==================================================================== Test                             Result       Flag       Units Drug Present and Declared for Prescription Verification   Gabapentin                     PRESENT      EXPECTED   Trazodone                      PRESENT      EXPECTED   1,3 chlorophenyl piperazine    PRESENT      EXPECTED    1,3-chlorophenyl piperazine is an expected metabolite of    trazodone.   Venlafaxine                    PRESENT      EXPECTED   Desmethylvenlafaxine           PRESENT      EXPECTED    Desmethylvenlafaxine is an expected metabolite of venlafaxine.   Aripiprazole                   PRESENT      EXPECTED   Diclofenac                     PRESENT      EXPECTED   Hydroxyzine                    PRESENT      EXPECTED Drug Present not  Declared for Prescription Verification   Carboxy-THC                    277          UNEXPECTED ng/mg creat    Carboxy-THC is a metabolite of tetrahydrocannabinol  (THC).    [facility], but THC is also present    in a scheduled prescription medication. Drug Absent but Declared for Prescription Verification   Oxycodone                      Not Detected UNEXPECTED ng/mg creat   Tizanidine                     Not Detected UNEXPECTED    Tizanidine, as indicated in the declared medication list, is not    always detected  even when used as directed. ==================================================================== Test                      Result    Flag   Units      Ref Range   Creatinine              84               mg/dL      >=20 ==================================================================== Declared Medications:  The flagging and interpretation on this report are based on the  following declared medications.  Unexpected results Bihl arise from  inaccuracies in the declared medications.  **Note: The testing scope of this panel includes these medications:  Aripiprazole (Abilify)  Gabapentin  Hydroxyzine  Oxycodone (Roxicodone)  Trazodone (Desyrel)  Venlafaxine (Effexor)  **Note: The testing scope of this panel does not include small to  moderate amounts of these reported medications:  Diclofenac (Voltaren)  Tizanidine (Zanaflex)  **Note: The testing scope of this panel does not include following  reported medications:  Cholecalciferol  Enoxaparin (Lovenox)  Hydrochlorothiazide  Hydrochlorothiazide (Microzide)  Simvastatin (Zocor) ==================================================================== For clinical consultation, please call [phone]. ====================================================================    UDS interpretation: Unexpected findings: Undeclared illicit substance detected Medication Assessment Form: Patient  introduced to form today Treatment compliance: Not applicable Risk Assessment Profile: Aberrant behavior: use of illicit substances Comorbid factors increasing risk of overdose: A history of substance abuse and Age 4-66 years old Risk Mitigation Strategies:  Patient opioid safety counseling: No controlled substances prescribed Patient-Prescriber Agreement (PPA): No agreement signed  Controlled substance notification to other providers: None required. No opioid therapy  Pharmacologic Plan: Patient is not an appropriate candidate for opioid therapy at this time. Today I will take over the patient's gabapentin, Voltaren, and Zanaflex.  Laboratory Chemistry  Inflammation Markers Lab Results  Component Value Date   ESRSEDRATE 2 05/06/2016   CRP <0.80 05/06/2016   Renal Function Lab Results  Component Value Date   BUN 17 05/06/2016   CREATININE 0.96 05/06/2016   GFRAA >60 05/06/2016   GFRNONAA >60 05/06/2016   Hepatic Function Lab Results  Component Value Date   AST 23 05/06/2016   ALT 27 05/06/2016   ALBUMIN 4.5 05/06/2016   Electrolytes Lab Results  Component Value Date   NA 136 05/06/2016   K 3.9 05/06/2016   CL 100 (L) 05/06/2016   CALCIUM 9.3 05/06/2016   MG 2.1 05/06/2016   Pain Modulating Vitamins Lab Results  Component Value Date   25OHVITD1 56 05/06/2016   25OHVITD2 9.2 05/06/2016   25OHVITD3 47 05/06/2016   VITAMINB12 319 05/06/2016   Coagulation Parameters Lab Results  Component Value Date   INR 0.93 05/06/2016   LABPROT 12.5 05/06/2016   APTT 29 05/06/2016   PLT 287 05/06/2016   Cardiovascular Lab Results  Component Value Date   HGB 15.4 05/06/2016   HCT 45.5 05/06/2016   Note: Lab results reviewed.  Recent Diagnostic Imaging Review  Cervical Imaging: Cervical MR wo contrast:  Results for orders placed in visit on 07/20/08  MR C Spine Ltd W/O Cm   Narrative * PRIOR REPORT IMPORTED FROM AN EXTERNAL SYSTEM *   PRIOR REPORT IMPORTED FROM  THE SY[facility] [place] EXAM:    neck pain  COMMENTS:  [id number]   PROCEDURE:     MR  - MR CERVICAL SPINE WO CONT  - Jul 20 2008  4:02PM   RESULT:  History: Neck pain   Comparison studies: None   Procedure and Findings: Multiplanar ,multisequence imaging of the cervical  spine is obtained. Left paracentral disc protrusion is present at C4-C5.  This results in mild cervical cord deformity. Left paracentral a prominent  disc protrusion is noted at C5-C6 with deformity of the of cervical cord  at  this level. No bony abnormality identified. Cervical cord is normal. A  vertebral junction normal.   IMPRESSION:     1. Left paracentral small disc protrusion C4-C5.  2. Prominent left paracentral C5-C6 disc protrusion.       Shoulder Imaging: Shoulder-L MR wo contrast:  Results for orders placed in visit on 12/16/07  MR Shoulder Left Wo Contrast   Narrative * PRIOR REPORT IMPORTED FROM AN EXTERNAL SYSTEM *   PRIOR REPORT IMPORTED FROM THE [facility] WORKFLOW SYSTEM   REASON FOR EXAM:    shoulder, back pain  COMMENTS:   PROCEDURE:     MR  - MR SHOULDER LT  WO CONTRAST  - Dec 16 2007 10:13AM   RESULT:     Multiplanar and multisequence imaging of the LEFT shoulder was  obtained without the administration gadolinium.   The biceps tendon is appreciated along the bicipital groove.  The  cartilaginous glenoid labrum appears to be grossly unremarkable.  Examination of the osseous structures reveals degenerative change  involving  the acromioclavicular joint. There is marrow edema and pannus formation.  Mass-effect upon the supraspinatus musculature and tendon is identified.  The  humerus demonstrate areas of mild osteophytosis. Evaluation of the  supraspinatus tendon demonstrates a focal area of increased T2 signal just  distal to the musculotendinous junction. This appears to traverse the  tendon  and is concerning for an area of high-grade partial tearing versus   full-thickness tearing without retraction. The remaining components of the  rotator cuff appear to be intact.   IMPRESSION:  1.     Findings consistent with a high-grade partial tear versus  full-thickness tearing without retraction of the supraspinatus tendon.   This is just distal to the musculotendinous junction.  2.     Impingement anatomy involving the acromioclavicular joint and mild  degenerative changes involving the humeral head.   Thank you for the opportunity to contribute to the care of your patient.       Lumbosacral Imaging: Lumbar MR wo contrast:  Results for orders placed in visit on 01/18/10  MR L Spine Ltd W/O Cm   Narrative * PRIOR REPORT IMPORTED FROM AN EXTERNAL SYSTEM *   PRIOR REPORT IMPORTED FROM THE SYN[facility] [place] EXAM:    back pain  COMMENTS:   PROCEDURE:     MMR - MMR LUMBAR SPINE WO CONTRAST  - Jan 18 2010  3:13PM   RESULT:     MRI LUMBAR SPINE WITHOUT CONTRAST   HISTORY: Back pain   COMPARISON: 12/16/2007   TECHNIQUE: Multiplanar and multisequence MRI of the lumbar spine were  obtained, without administration of IV contrast.   FINDINGS:   The vertebral bodies of the lumbar spine are normal in size and alignment.  There is normal bone marrow signal demonstrated throughout the vertebra.  There is disc desiccation at L2-L3, L3-L4 and L4-L5.   The spinal cord is of normal volume and contour. The cord terminates  normally at L1 . The nerve roots of the cauda equina and the filum  terminale  have the usual appearance.   The visualized portions  of the SI joints are unremarkable.   The imaged intra-abdominal contents are unremarkable.   T12-L1: No significant disc bulge. No evidence of neural foraminal or  central stenosis.   L1-L2: No significant disc bulge. No evidence of neural foraminal or  central stenosis.   L2-L3: Mild broad-based disc bulge. No evidence of neural foraminal or  central stenosis.   L3-L4:  Moderate broad-based disc bulge with flattening of the ventral  thecal sac. There is moderate-severe spinal stenosis at L3-L4. No evidence of neural foraminal stenosis.   L4-L5: Mild broad-based disc bulge with flattening of the ventral thecal  sac. Moderate bilateral facet arthropathy with ligamentum flavum infolding resulting in moderate-severe spinal stenosis. No evidence of neural foraminal stenosis.   L5-S1: No significant disc bulge. No evidence of neural foraminal or  central  stenosis. Mild bilateral facet arthropathy.   IMPRESSION:   1. Lumbar spine spondylosis as described above.       Knee Imaging: Knee-L MR w contrast:  Results for orders placed in visit on 12/01/06  MR Knee Left  Wo Contrast   Narrative * PRIOR REPORT IMPORTED FROM AN EXTERNAL SYSTEM *   PRIOR REPORT IMPORTED FROM THE [facility] WORKFLOW SYSTEM   REASON FOR EXAM:    left knee pain    eval for tears  COMMENTS:   PROCEDURE:     MR  - MR KNEE LT  WO CONTRAST  - Dec 01 2006 10:23AM   RESULT:     Comparison: Left knee radiographs on 11/25/2006.   Technique: Multiplanar, multisequence MR examination of the left knee was  performed without gadolinium contrast.   Findings:   There is a moderate to large knee effusion. Stranding is noted in the  suprapatellar recess, suggestive of synovitis. There is a complex tear  involving the posterior horn medial meniscus with extension into the body.  The lateral meniscus is unremarkable. The ACL and PCL are intact. The  extensor mechanism and collateral ligaments are unremarkable.   No fracture is noted. There is significant cartilage thinning involving  the  medial compartment with significant cartilage irregularities. There are  small areas of near total cartilage loss. Mild cartilage irregularities  are  also seen involving the lateral and patellofemoral compartments. Small  osteophytes are seen involving the medial and lateral compartments. There  is  no  Baker's cyst.   IMPRESSION:   1. There is a complex tear involving the posterior horn medial meniscus  with extension into the body.  2. Cartilage abnormalities most prominently involving the medial  compartment are as described above.  3. Moderate to large knee effusion with changes of synovitis.   Thank you for this opportunity to contribute to the care of your patient.       Knee-R MR wo contrast:  Results for orders placed in visit on 01/18/10  MR Knee Right Wo Contrast   Narrative * PRIOR REPORT IMPORTED FROM AN EXTERNAL SYSTEM *   PRIOR REPORT IMPORTED FROM THE [facility] WORKFLOW SYSTEM   REASON FOR EXAM:    back pain  COMMENTS:   PROCEDURE:     MMR - MMR KNEE RT WO CONTRAST  - Jan 18 2010  3:49PM   RESULT:     History: Pain   Comparison: None   Technique: Multiplanar and multisequence MRI of the right knee was  performed  without IV contrast.   Findings:   There is a complex tear of the posterior horn and body of the medial  meniscus with a radial component and peripheral meniscal extrusion.   There is severe attenuation of the lateral meniscus most concerning for a  tear.   There is thickening of the posterior cruciate ligament most concerning for  prior injury.   The anterior cruciate ligament is intact.   The medial collateral ligament and lateral collateral ligamentous complex  are intact.  The popliteus tendon is intact.   The retinacular complex is intact. The extensor mechanism is intact.   There is cartilage irregularity with partial thickness cartilage loss  involving the medial femoral condyle and medial tibial plateau. There is  full-thickness cartilage loss of the lateral femoral condyle and lateral  tibial plateau. There are tricompartmental marginal osteophytes.   There is no Baker's cyst. There is no abnormal signal within Hoffa's fat.  There is no plical thickening. There is no bursal abnormality. There is no  abnormal bone marrow signal.  There is no significant joint effusion.   IMPRESSION:   1. There is a complex tear of the posterior horn and body of the medial  meniscus with a radial component and peripheral meniscal extrusion.   2. There is severe attenuation of the lateral meniscus most concerning for a tear.   3. Cartilage abnormalities as described above.       Note: Results of ordered imaging test(s) reviewed and explained to patient in Layman's terms. Copy of results provided to patient  Meds  The patient has a current medication list which includes the following prescription(s): albuterol, aripiprazole, diclofenac, hydrochlorothiazide, hydroxyzine, simvastatin, tizanidine, trazodone, venlafaxine xr, and gabapentin.  Current Outpatient Prescriptions on File Prior to Visit  Medication Sig  . ARIPiprazole (ABILIFY) 10 MG tablet Take 10 mg by mouth daily.  . hydrochlorothiazide (HYDRODIURIL) 25 MG tablet Take 25 mg by mouth daily.   . hydrOXYzine (ATARAX/VISTARIL) 50 MG tablet Take 50 mg by mouth 3 (three) times daily.  . simvastatin (ZOCOR) 20 MG tablet Take 20 mg by mouth daily at 6 PM.  . traZODone (DESYREL) 150 MG tablet Take 150 mg by mouth at bedtime as needed.   . venlafaxine XR (EFFEXOR-XR) 75 MG 24 hr capsule TAKE 3 CAPSULES BY MOUTH EVERY MORNING   No current facility-administered medications on file prior to visit.    ROS  Constitutional: Denies any fever or chills Gastrointestinal: No reported hemesis, hematochezia, vomiting, or acute GI distress Musculoskeletal: Denies any acute onset joint swelling, redness, loss of ROM, or weakness Neurological: No reported episodes of acute onset apraxia, aphasia, dysarthria, agnosia, amnesia, paralysis, loss of coordination, or loss of consciousness  Allergies  Ms. Helbling has No Known Allergies.  PFSH  Drug: Ms. Gallick  reports that she does not use drugs. Alcohol:  reports that she does not drink alcohol. Tobacco:  reports that she quit smoking about 3 years  ago. Her smoking use included Cigarettes. She smoked 1.00 pack per day. She has never used smokeless tobacco. Medical:  has a past medical history of Acute right-sided low back pain with right-sided sciatica (08/30/2014); Anxiety; Arthritis; Asthma; Bipolar disorder (HCC); Cervical spinal cord compression (HCC) (05/07/2012); DDD (degenerative disc disease), lumbar; Depression; Fibromyalgia; Hypertension; Neuritis or radiculitis due to rupture of lumbar intervertebral disc (05/23/2014); Periprosthetic fracture around prosthetic knee (07/31/2015); Restless leg syndrome; Soft tissue lesion of shoulder region (01/24/2012); Status post revision of total replacement of right knee (09/18/2015); Status post right knee replacement (02/01/2015); Syncope and collapse (04/08/2014); Thoracic and lumbosacral neuritis (05/07/2012); and Thoracic neuritis (05/07/2012). Family: family history includes Breast  cancer in her mother; Colon cancer in her father; Heart attack in her father.  Past Surgical History:  Procedure Laterality Date  . ABDOMINAL HYSTERECTOMY    . Cervical Fusion X 2    . JOINT REPLACEMENT Right 01/2016   Dr Gavin Potters  . KNEE ARTHROSCOPY Bilateral    Right knee scope 1998, Left knee scope  . TONSILLECTOMY    . TOTAL KNEE ARTHROPLASTY Right 02/01/2015   Procedure: TOTAL KNEE ARTHROPLASTY;  Surgeon: Erin Sons, MD;  Location: [facility] ORS;  Service: Orthopedics;  Laterality: Right;  . TUBAL LIGATION     Constitutional Exam  General appearance: Well nourished, well developed, and well hydrated. In no apparent acute distress Vitals:   07/09/16 1153  BP: 121/69  Pulse: 75  Resp: 16  Temp: 98.6 F (37 C)  TempSrc: Oral  SpO2: 99%  Weight: 283 lb (128.4 kg)  Height: 5\' 6"  (1.676 m)   BMI Assessment: Estimated body mass index is 45.68 kg/m as calculated from the following:   Height as of this encounter: 5\' 6"  (1.676 m).   Weight as of this encounter: 283 lb (128.4 kg).  BMI interpretation  table: BMI level Category Range association with higher incidence of chronic pain  <18 kg/m2 Underweight   18.5-24.9 kg/m2 Ideal body weight   25-29.9 kg/m2 Overweight Increased incidence by 20%  30-34.9 kg/m2 Obese (Class I) Increased incidence by 68%  35-39.9 kg/m2 Severe obesity (Class II) Increased incidence by 136%  >40 kg/m2 Extreme obesity (Class III) Increased incidence by 254%   BMI Readings from Last 4 Encounters:  07/09/16 45.68 kg/m  05/06/16 46.81 kg/m  02/01/15 48.75 kg/m  01/24/15 48.75 kg/m   Wt Readings from Last 4 Encounters:  07/09/16 283 lb (128.4 kg)  05/06/16 290 lb (131.5 kg)  02/01/15 284 lb (128.8 kg)  01/24/15 284 lb (128.8 kg)  Psych/Mental status: Alert, oriented x 3 (person, place, & time)       Eyes: PERLA Respiratory: No evidence of acute respiratory distress  Cervical Spine Exam  Inspection: No masses, redness, or swelling Alignment: Symmetrical Functional ROM: Unrestricted ROM Stability: No instability detected Muscle strength & Tone: Functionally intact Sensory: Unimpaired Palpation: Non-contributory  Upper Extremity (UE) Exam    Side: Right upper extremity  Side: Left upper extremity  Inspection: No masses, redness, swelling, or asymmetry. No contractures  Inspection: No masses, redness, swelling, or asymmetry. No contractures  Functional ROM: Unrestricted ROM          Functional ROM: Unrestricted ROM          Muscle strength & Tone: Functionally intact  Muscle strength & Tone: Functionally intact  Sensory: Unimpaired  Sensory: Unimpaired  Palpation: Euthermic  Palpation: Euthermic  Specialized Test(s): Deferred         Specialized Test(s): Deferred          Thoracic Spine Exam  Inspection: No masses, redness, or swelling Alignment: Symmetrical Functional ROM: Unrestricted ROM Stability: No instability detected Sensory: Unimpaired Muscle strength & Tone: Functionally intact Palpation: Non-contributory  Lumbar Spine Exam   Inspection: No masses, redness, or swelling Alignment: Symmetrical Functional ROM: Limited ROM Stability: No instability detected Muscle strength & Tone: Functionally intact Sensory: Movement-associated pain Palpation: Complains of area being tender to palpation Provocative Tests: Lumbar Hyperextension and rotation test: Positive bilaterally for facet joint pain. Patrick's Maneuver: Positive for bilateral S-I joint pain           Gait & Posture Assessment  Ambulation: Patient ambulates using a cane  Gait: Modified gait pattern (slower gait speed, wider stride width, and longer stance duration) associated with morbid obesity Posture: WNL   Lower Extremity Exam    Side: Right lower extremity  Side: Left lower extremity  Inspection: No masses, redness, swelling, or asymmetry. No contractures  Inspection: No masses, redness, swelling, or asymmetry. No contractures  Functional ROM: Unrestricted ROM          Functional ROM: Unrestricted ROM          Muscle strength & Tone: Functionally intact  Muscle strength & Tone: Functionally intact  Sensory: Unimpaired  Sensory: Unimpaired  Palpation: No palpable anomalies  Palpation: No palpable anomalies   Assessment & Plan  Primary Diagnosis & Pertinent Problem List: The primary encounter diagnosis was Chronic hip pain (Location of Primary Source of Pain) (Bilateral) (L>R). 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  Visit Diagnosis: 1. Chronic hip pain (Location of Primary Source of Pain) (Bilateral) (L>R)   2. Chronic low back pain (Location of Secondary source of pain) (Bilateral) (L>R)   3. Chronic shoulder pain (Location of Tertiary source of pain) (Bilateral) (R>L)   4. Marijuana use   5. Long term prescription opiate use   6. Long term current use of anticoagulant therapy (Lovenox)   7. 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 Lumbar facet joint syndrome (B) (L>R)   10. Neurogenic pain   11. Fibromyalgia   12. Primary osteoarthritis involving multiple joints   13. 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)  Radiculopathy of cervical region (B)(R>L)(Thumb + Index fingers)  Chronic shoulder pain (Location of Tertiary source of pain) (Bilateral) (L>R)  S/P TKR Arthroplasty (Right)  Osteoarthritis of knee (Left)  Lumbar canal stenosis (L3-4 and L4-5)  Cervical post-laminectomy syndrome (Anterior-posterior approach) (2014)  Anxiety and Depression  H/O Syncope  Hyperlipidemia  Pvc's (Premature Ventricular Contractions)  Status Post Revision of Total Replacement of Right Knee (Resolved)  Periprosthetic Fracture Around Prosthetic Knee (Resolved)  Status Post Right Knee Replacement (Resolved)  Neuritis Or Radiculitis Due to Rupture of Lumbar Intervertebral Disc (Resolved)  Cervical Spinal Cord Compression (Hcc) (Resolved)  Thoracic Neuritis (Resolved)  Thoracic and Lumbosacral Neuritis (Resolved)  Soft Tissue Lesion of Shoulder Region (Resolved)  Acute Right-Sided Low Back Pain With  Right-Sided Sciatica (Resolved)  Syncope and Collapse (Resolved)   Problem-specific Plan(s): No problem-specific Assessment & Plan notes found for this encounter.  Assessment & plan notes cannot be loaded without a specified hospital service.  Plan of Care  Pharmacotherapy (Medications Ordered): Meds ordered this encounter  Medications  . gabapentin (NEURONTIN) 800 MG tablet    Sig: Take 1 tablet (800 mg total) by mouth every 6 (six) hours.  Dispense:  120 tablet    Refill:  2    Do not place this medication, or any other prescription from our practice, on "Automatic Refill". Patient Laham have prescription filled one day early if pharmacy is closed on scheduled refill date.  Marland Kitchen tiZANidine (ZANAFLEX) 4 MG tablet    Sig: Take 1 tablet (4 mg total) by mouth every 6 (six) hours as needed for muscle spasms.    Dispense:  120 tablet    Refill:  2    Do not place medication on "Automatic Refill". Fill one day early if pharmacy is closed on scheduled refill date.  . diclofenac (VOLTAREN) 75 MG EC tablet    Sig: Take 1 tablet (75 mg total) by mouth 2 (two) times daily.    Dispense:  60 tablet    Refill:  2    Do not place medication on "Automatic Refill". Fill one day early if pharmacy is closed on scheduled refill date.   Lab-work, procedure(s), and/or referral(s): Orders Placed This Encounter  Procedures  . LUMBAR FACET(MEDIAL BRANCH NERVE BLOCK) MBNB  . SACROILIAC JOINT INJECTINS    Pharmacotherapy: Opioid Analgesics: Not an appropriate candidate for opioid therapy, due to the daily use of marijuana. Membrane stabilizer: Today I took over her gabapentin and I have gone up from 1200 mg by mouth twice a day to 800 mg 4 times a day. Muscle relaxant: Today I took over her Zanaflex and I have gone up from 4 mg 3 times a day to 4 times a day. NSAID: Currently on an appropriate regimen. Today I will take over her diclofenac 75 mg by mouth twice a day. Other analgesic(s): To be determined  at a later time   Interventional therapies: Planned, scheduled, and/or pending:    Diagnostic bilateral lumbar facet block + Diagnostic bilateral intra-articular S-I joint injection   Considering:   Diagnostic bilateral lumbar facet block Possible bilateral lumbar facet RFA Diagnostic bilateral intra-articular S-I joint injection  Possible bilateral S-I joint RFA Diagnostic bilateral intra-articular shoulder joint injection Diagnostic bilateral suprascapular nerve block Possible bilateral suprascapular RFA Diagnostic left intra-articular knee joint injection  Possible series of 5 Hyalgan intra-articular left knee joint injections  Diagnostic bilateral genicular nerve blocks  Possible bilateral genicular nerve RFA Diagnostic bilateral cervical facet blocks Possible bilateral cervical facet RFA Diagnostic left cervical epidural steroid injection   PRN Procedures:   To be determined at a later time   Provider-requested follow-up: Return in about 3 months (around 10/06/2016) for procedure (ASAA), (MD) Med-Mgmt.  Future Appointments Date Time Provider Beverly  08/13/2016 2:45 PM Molli Barrows, MD [facility] None    Primary Care Physician: [facility] Location: [facility] Note by: Kathlen Brunswick. Dossie Arbour, M.D, DABA, DABAPM, DABPM, DABIPP, FIPP Date: 07/09/2016; Time: 2:30 PM  Pain Score Disclaimer: We use the NRS-11 scale. This is a self-reported, subjective measurement of pain severity with only modest accuracy. It is used primarily to identify changes within a particular patient. It must be understood that outpatient pain scales are significantly less accurate that those used for research, where they can be applied under ideal controlled circumstances with minimal exposure to variables. In reality, the score is likely to be a combination of pain intensity and pain affect, where pain affect describes the degree of emotional  arousal or changes in action readiness caused by the sensory experience of pain. Factors such as social and work situation, setting, emotional state, anxiety levels, expectation, and prior  pain experience Kulakowski influence pain perception and show large inter-individual differences that Gentry also be affected by time variables.  Patient instructions provided during this appointment: Patient Instructions  Prescriptions for Diclofenac, Tizanidine, and Gabapentin were sent to your pharmacy. Sacroiliac (SI) Joint Injection Patient Information  Description: The sacroiliac joint connects the scrum (very low back and tailbone) to the ilium (a pelvic bone which also forms half of the hip joint).  Normally this joint experiences very little motion.  When this joint becomes inflamed or unstable low back and or hip and pelvis pain Beauchamp result.  Injection of this joint with local anesthetics (numbing medicines) and steroids can provide diagnostic information and reduce pain.  This injection is performed with the aid of x-ray guidance into the tailbone area while you are lying on your stomach.   You Nyborg experience an electrical sensation down the leg while this is being done.  You Vanatta also experience numbness.  We also Sluka ask if we are reproducing your normal pain during the injection.  Conditions which Liford be treated SI injection:   Low back, buttock, hip or leg pain  Preparation for the Injection:  1. Do not eat any solid food or dairy products within 8 hours of your appointment.  2. You Delmore drink clear liquids up to 3 hours before appointment.  Clear liquids include water, black coffee, juice or soda.  No milk or cream please. 3. You Hibner take your regular medications, including pain medications with a sip of water before your appointment.  Diabetics should hold regular insulin (if take separately) and take 1/2 normal NPH dose the morning of the procedure.  Carry some sugar containing items with you to your  appointment. 4. A driver must accompany you and be prepared to drive you home after your procedure. 5. Bring all of your current medications with you. 6. An IV Schwalm be inserted and sedation Bisher be given at the discretion of the physician. 7. A blood pressure cuff, EKG and other monitors will often be applied during the procedure.  Some patients Giammarino need to have extra oxygen administered for a short period.  8. You will be asked to provide medical information, including your allergies, prior to the procedure.  We must know immediately if you are taking blood thinners (like Coumadin/Warfarin) or if you are allergic to IV iodine contrast (dye).  We must know if you could possible be pregnant.  Possible side effects:   Bleeding from needle site  Infection (rare, Neuser require surgery)  Nerve injury (rare)  Numbness & tingling (temporary)  A brief convulsion or seizure  Light-headedness (temporary)  Pain at injection site (several days)  Decreased blood pressure (temporary)  Weakness in the leg (temporary)   Call if you experience:   New onset weakness or numbness of an extremity below the injection site that last more than 8 hours.  Hives or difficulty breathing ( go to the emergency room)  Inflammation or drainage at the injection site  Any new symptoms which are concerning to you  Please note:  Although the local anesthetic injected can often make your back/ hip/ buttock/ leg feel good for several hours after the injections, the pain will likely return.  It takes 3-7 days for steroids to work in the sacroiliac area.  You Delillo not notice any pain relief for at least that one week.  If effective, we will often do a series of three injections spaced 3-6 weeks apart to maximally decrease your  pain.  After the initial series, we generally will wait some months before a repeat injection of the same type.  If you have any questions, please call [phone] [facility]  Facet Blocks Patient Information  Description: The facets are joints in the spine between the vertebrae.  Like any joints in the body, facets can become irritated and painful.  Arthritis can also effect the facets.  By injecting steroids and local anesthetic in and around these joints, we can temporarily block the nerve supply to them.  Steroids act directly on irritated nerves and tissues to reduce selling and inflammation which often leads to decreased pain.  Facet blocks Gwinner be done anywhere along the spine from the neck to the low back depending upon the location of your pain.   After numbing the skin with local anesthetic (like Novocaine), a small needle is passed onto the facet joints under x-ray guidance.  You Villanueva experience a sensation of pressure while this is being done.  The entire block usually lasts about 15-25 minutes.   Conditions which Virginia be treated by facet blocks:   Low back/buttock pain  Neck/shoulder pain  Certain types of headaches  Preparation for the injection:  10. Do not eat any solid food or dairy products within 8 hours of your appointment. 11. You Bookbinder drink clear liquid up to 3 hours before appointment.  Clear liquids include water, black coffee, juice or soda.  No milk or cream please. 12. You Lenhardt take your regular medication, including pain medications, with a sip of water before your appointment.  Diabetics should hold regular insulin (if taken separately) and take 1/2 normal NPH dose the morning of the procedure.  Carry some sugar containing items with you to your appointment. 13. A driver must accompany you and be prepared to drive you home after your procedure. 14. Bring all your current medications with you. 15. An IV Gwaltney be inserted and sedation Lariviere be given at the discretion of the physician. 16. A blood pressure cuff, EKG and other monitors will often be applied during the procedure.  Some patients Thier need to have extra  oxygen administered for a short period. 17. You will be asked to provide medical information, including your allergies and medications, prior to the procedure.  We must know immediately if you are taking blood thinners (like Coumadin/Warfarin) or if you are allergic to IV iodine contrast (dye).  We must know if you could possible be pregnant.  Possible side-effects:   Bleeding from needle site  Infection (rare, Buchanon require surgery)  Nerve injury (rare)  Numbness & tingling (temporary)  Difficulty urinating (rare, temporary)  Spinal headache (a headache worse with upright posture)  Light-headedness (temporary)  Pain at injection site (serveral days)  Decreased blood pressure (rare, temporary)  Weakness in arm/leg (temporary)  Pressure sensation in back/neck (temporary)   Call if you experience:   Fever/chills associated with headache or increased back/neck pain  Headache worsened by an upright position  New onset, weakness or numbness of an extremity below the injection site  Hives or difficulty breathing (go to the emergency room)  Inflammation or drainage at the injection site(s)  Severe back/neck pain greater than usual  New symptoms which are concerning to you  Please note:  Although the local anesthetic injected can often make your back or neck feel good for several hours after the injection, the pain will likely return. It takes 3-7 days for steroids to work.  You Stanard not notice any pain relief for at least one week.  If effective, we will often do a series of 2-3 injections spaced 3-6 weeks apart to maximally decrease your pain.  After the initial series, you Liddicoat be a candidate for a more permanent nerve block of the facets.  If you have any questions, please call #[phone]) [facility]

## 2016-07-09 NOTE — Progress Notes (Signed)
Safety precautions to be maintained throughout the outpatient stay will include: orient to surroundings, keep bed in low position, maintain call bell within reach at all times, provide assistance with transfer out of bed and ambulation.  

## 2016-07-09 NOTE — Patient Instructions (Addendum)
Prescriptions for Diclofenac, Tizanidine, and Gabapentin were sent to your pharmacy. Sacroiliac (SI) Joint Injection Patient Information  Description: The sacroiliac joint connects the scrum (very low back and tailbone) to the ilium (a pelvic bone which also forms half of the hip joint).  Normally this joint experiences very little motion.  When this joint becomes inflamed or unstable low back and or hip and pelvis pain Woodrome result.  Injection of this joint with local anesthetics (numbing medicines) and steroids can provide diagnostic information and reduce pain.  This injection is performed with the aid of x-ray guidance into the tailbone area while you are lying on your stomach.   You Elks experience an electrical sensation down the leg while this is being done.  You Delapena also experience numbness.  We also Mcneel ask if we are reproducing your normal pain during the injection.  Conditions which Vita be treated SI injection:   Low back, buttock, hip or leg pain  Preparation for the Injection:  1. Do not eat any solid food or dairy products within 8 hours of your appointment.  2. You Mccauslin drink clear liquids up to 3 hours before appointment.  Clear liquids include water, black coffee, juice or soda.  No milk or cream please. 3. You Altland take your regular medications, including pain medications with a sip of water before your appointment.  Diabetics should hold regular insulin (if take separately) and take 1/2 normal NPH dose the morning of the procedure.  Carry some sugar containing items with you to your appointment. 4. A driver must accompany you and be prepared to drive you home after your procedure. 5. Bring all of your current medications with you. 6. An IV Whirley be inserted and sedation Agena be given at the discretion of the physician. 7. A blood pressure cuff, EKG and other monitors will often be applied during the procedure.  Some patients Dietzman need to have extra oxygen administered for a short  period.  8. You will be asked to provide medical information, including your allergies, prior to the procedure.  We must know immediately if you are taking blood thinners (like Coumadin/Warfarin) or if you are allergic to IV iodine contrast (dye).  We must know if you could possible be pregnant.  Possible side effects:   Bleeding from needle site  Infection (rare, Ansell require surgery)  Nerve injury (rare)  Numbness & tingling (temporary)  A brief convulsion or seizure  Light-headedness (temporary)  Pain at injection site (several days)  Decreased blood pressure (temporary)  Weakness in the leg (temporary)   Call if you experience:   New onset weakness or numbness of an extremity below the injection site that last more than 8 hours.  Hives or difficulty breathing ( go to the emergency room)  Inflammation or drainage at the injection site  Any new symptoms which are concerning to you  Please note:  Although the local anesthetic injected can often make your back/ hip/ buttock/ leg feel good for several hours after the injections, the pain will likely return.  It takes 3-7 days for steroids to work in the sacroiliac area.  You Rion not notice any pain relief for at least that one week.  If effective, we will often do a series of three injections spaced 3-6 weeks apart to maximally decrease your pain.  After the initial series, we generally will wait some months before a repeat injection of the same type.  If you have any questions, please call [phone]  [phone]  [facility]  Facet Blocks Patient Information  Description: The facets are joints in the spine between the vertebrae.  Like any joints in the body, facets can become irritated and painful.  Arthritis can also effect the facets.  By injecting steroids and local anesthetic in and around these joints, we can temporarily block the nerve supply to them.  Steroids act directly on irritated  nerves and tissues to reduce selling and inflammation which often leads to decreased pain.  Facet blocks Vanderwall be done anywhere along the spine from the neck to the low back depending upon the location of your pain.   After numbing the skin with local anesthetic (like Novocaine), a small needle is passed onto the facet joints under x-ray guidance.  You Pang experience a sensation of pressure while this is being done.  The entire block usually lasts about 15-25 minutes.   Conditions which Caratachea be treated by facet blocks:   Low back/buttock pain  Neck/shoulder pain  Certain types of headaches  Preparation for the injection:  10. Do not eat any solid food or dairy products within 8 hours of your appointment. 11. You Childers drink clear liquid up to 3 hours before appointment.  Clear liquids include water, black coffee, juice or soda.  No milk or cream please. 12. You Balducci take your regular medication, including pain medications, with a sip of water before your appointment.  Diabetics should hold regular insulin (if taken separately) and take 1/2 normal NPH dose the morning of the procedure.  Carry some sugar containing items with you to your appointment. 13. A driver must accompany you and be prepared to drive you home after your procedure. 14. Bring all your current medications with you. 15. An IV Marsteller be inserted and sedation Laramie be given at the discretion of the physician. 16. A blood pressure cuff, EKG and other monitors will often be applied during the procedure.  Some patients Crunk need to have extra oxygen administered for a short period. 17. You will be asked to provide medical information, including your allergies and medications, prior to the procedure.  We must know immediately if you are taking blood thinners (like Coumadin/Warfarin) or if you are allergic to IV iodine contrast (dye).  We must know if you could possible be pregnant.  Possible side-effects:   Bleeding from needle  site  Infection (rare, Ramsaran require surgery)  Nerve injury (rare)  Numbness & tingling (temporary)  Difficulty urinating (rare, temporary)  Spinal headache (a headache worse with upright posture)  Light-headedness (temporary)  Pain at injection site (serveral days)  Decreased blood pressure (rare, temporary)  Weakness in arm/leg (temporary)  Pressure sensation in back/neck (temporary)   Call if you experience:   Fever/chills associated with headache or increased back/neck pain  Headache worsened by an upright position  New onset, weakness or numbness of an extremity below the injection site  Hives or difficulty breathing (go to the emergency room)  Inflammation or drainage at the injection site(s)  Severe back/neck pain greater than usual  New symptoms which are concerning to you  Please note:  Although the local anesthetic injected can often make your back or neck feel good for several hours after the injection, the pain will likely return. It takes 3-7 days for steroids to work.  You Luoma not notice any pain relief for at least one week.  If effective, we will often do a series of 2-3 injections spaced 3-6 weeks apart  to maximally decrease your pain.  After the initial series, you Needs be a candidate for a more permanent nerve block of the facets.  If you have any questions, please call #[phone]) [facility]

## 2016-07-29 ENCOUNTER — Telehealth: Payer: Self-pay | Admitting: Pain Medicine

## 2016-07-29 NOTE — Telephone Encounter (Addendum)
patient Still waiting on prior auth for medications, please call patient to let her know status

## 2016-07-31 NOTE — Telephone Encounter (Signed)
Gabapentin changed to capsule form. Voltaren approved through 08-05-17. Patient and pharmacy notified.

## 2016-07-31 NOTE — Telephone Encounter (Signed)
Spoke with patient, needs PA on Gabapentin and Diclofenac. Will do later today.

## 2016-08-13 ENCOUNTER — Ambulatory Visit: Payer: Medicaid Other | Admitting: Anesthesiology

## 2016-08-14 ENCOUNTER — Ambulatory Visit (HOSPITAL_BASED_OUTPATIENT_CLINIC_OR_DEPARTMENT_OTHER): Payer: BLUE CROSS/BLUE SHIELD | Admitting: Pain Medicine

## 2016-08-14 ENCOUNTER — Ambulatory Visit
Admission: RE | Admit: 2016-08-14 | Discharge: 2016-08-14 | Disposition: A | Discharge status: Home / Self Care | Payer: BLUE CROSS/BLUE SHIELD | Source: Ambulatory Visit | Attending: Pain Medicine | Admitting: Pain Medicine

## 2016-08-14 ENCOUNTER — Encounter: Payer: Self-pay | Admitting: Pain Medicine

## 2016-08-14 VITALS — BP 122/74 | HR 64 | Temp 98.3°F | Resp 14 | Ht 66.0 in | Wt 287.0 lb

## 2016-08-14 DIAGNOSIS — M533 Sacrococcygeal disorders, not elsewhere classified: Secondary | ICD-10-CM

## 2016-08-14 DIAGNOSIS — G8929 Other chronic pain: Secondary | ICD-10-CM | POA: Insufficient documentation

## 2016-08-14 DIAGNOSIS — M545 Low back pain: Secondary | ICD-10-CM

## 2016-08-14 DIAGNOSIS — M5136 Other intervertebral disc degeneration, lumbar region: Secondary | ICD-10-CM

## 2016-08-14 DIAGNOSIS — M47816 Spondylosis without myelopathy or radiculopathy, lumbar region: Secondary | ICD-10-CM

## 2016-08-14 DIAGNOSIS — M1288 Other specific arthropathies, not elsewhere classified, other specified site: Secondary | ICD-10-CM

## 2016-08-14 MED ORDER — LIDOCAINE HCL (PF) 1 % IJ SOLN
10.0000 mL | Freq: Once | INTRAMUSCULAR | Status: AC
  Administered 2016-08-14: 5 mL
  Filled 2016-08-14: qty 10

## 2016-08-14 MED ORDER — TRIAMCINOLONE ACETONIDE 40 MG/ML IJ SUSP
40.0000 mg | Freq: Once | INTRAMUSCULAR | Status: AC
  Administered 2016-08-14: 40 mg
  Filled 2016-08-14: qty 1

## 2016-08-14 MED ORDER — MIDAZOLAM HCL 5 MG/5ML IJ SOLN
1.0000 mg | INTRAMUSCULAR | Status: DC | PRN
  Administered 2016-08-14: 5 mg via INTRAVENOUS
  Filled 2016-08-14: qty 5

## 2016-08-14 MED ORDER — ROPIVACAINE HCL 5 MG/ML IJ SOLN
5.0000 mL | Freq: Once | INTRAMUSCULAR | Status: AC
  Administered 2016-08-14: 20 mL via PERINEURAL

## 2016-08-14 MED ORDER — METHYLPREDNISOLONE ACETATE 80 MG/ML IJ SUSP
80.0000 mg | Freq: Once | INTRAMUSCULAR | Status: DC
  Filled 2016-08-14: qty 1

## 2016-08-14 MED ORDER — ROPIVACAINE HCL 5 MG/ML IJ SOLN
INTRAMUSCULAR | Status: AC
  Filled 2016-08-14: qty 20

## 2016-08-14 MED ORDER — FENTANYL CITRATE (PF) 100 MCG/2ML IJ SOLN
25.0000 ug | INTRAMUSCULAR | Status: DC | PRN
  Administered 2016-08-14: 100 ug via INTRAVENOUS
  Filled 2016-08-14: qty 2

## 2016-08-14 MED ORDER — LACTATED RINGERS IV SOLN: 1000.0000 mL | Freq: Once | INTRAVENOUS | Status: DC

## 2016-08-14 NOTE — Progress Notes (Signed)
Safety precautions to be maintained throughout the outpatient stay will include: orient to surroundings, keep bed in low position, maintain call bell within reach at all times, provide assistance with transfer out of bed and ambulation.  

## 2016-08-14 NOTE — Progress Notes (Signed)
Patient's Name: Shelly Wright  MRN: [id number]  Referring Provider: [facility]  DOB: 1970-09-02  PCP: [facility]  DOS: 08/14/2016  Note by: Sydnee Levans. Laban Emperor, MD  Service setting: Ambulatory outpatient  Location: [facility] (AMB) Pain Management Facility  Visit type: Procedure  Specialty: Interventional Pain Management  Patient type: Established   Primary Reason for Visit: Interventional Pain Management Treatment. CC: Back Pain (lower)  Procedure:  Anesthesia, Analgesia, Anxiolysis:  Procedure #1: Type: Diagnostic Medial Branch Facet Block Region: Lumbar Level: L2, L3, L4, L5, & S1 Medial Branch Level(s) Laterality: Bilateral  Procedure #2: Type: Diagnostic Sacroiliac Joint Block Region: Posterior Lumbosacral Level: PSIS (Posterior Superior Iliac Spine) Sacroiliac Joint Laterality: Bilateral  Type: Local Anesthesia with Moderate (Conscious) Sedation Local Anesthetic: Lidocaine 1% Route: Intravenous (IV) IV Access: Secured Sedation: Meaningful verbal contact was maintained at all times during the procedure  Indication(s): Analgesia and Anxiety  Indications: 1. Chronic low back pain (Location of Secondary source of pain) (Bilateral) (L>R)   2. Chronic sacroiliac joint pain (Location of Primary Source of Pain) (Bilateral) (L>R)   3. Lumbar facet syndrome (Bilateral) (L>R)   4. DDD (degenerative disc disease), lumbar    Pain Score: Pre-procedure: 0-No pain/10 Post-procedure: 0-No pain/10  Pre-op Assessment:  Previous date of service: 07/09/16 Service provided: Med Refill Shelly Wright is a 46 y.o. (year old), female patient, seen today for interventional treatment. 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 Her primarily concern today is the Back Pain (lower)  Initial Vital  Signs: Blood pressure 125/73, pulse 72, temperature 98.3 F (36.8 C), temperature source Oral, resp. rate 16, height 5\' 6"  (1.676 m), weight 287 lb (130.2 kg), SpO2 98 %. BMI: 46.32 kg/m  Risk Assessment: Allergies: Reviewed. She has No Known Allergies.  Allergy Precautions: None required Coagulopathies: "Reviewed. None identified.  Blood-thinner therapy: None at this time Active Infection(s): Reviewed. None identified. Shelly Wright is afebrile  Site Confirmation: Shelly Wright was asked to confirm the procedure and laterality before marking the site Procedure checklist: Completed Consent: Before the procedure and under the influence of no sedative(s), amnesic(s), or anxiolytics, the patient was informed of the treatment options, risks and possible complications. To fulfill our ethical and legal obligations, as recommended by the American Medical Association's Code of Ethics, I have informed the patient of my clinical impression; the nature and purpose of the treatment or procedure; the risks, benefits, and possible complications of the intervention; the alternatives, including doing nothing; the risk(s) and benefit(s) of the alternative treatment(s) or procedure(s); and the risk(s) and benefit(s) of doing nothing. The patient was provided information about the general risks and possible complications associated with the procedure. These Chastang include, but are not limited to: failure to achieve desired goals, infection, bleeding, organ or nerve damage, allergic reactions, paralysis, and death. In addition, the patient was informed of those risks and complications associated to Spine-related procedures, such as failure to decrease pain; infection (i.e.: Meningitis, epidural or intraspinal abscess); bleeding (i.e.: epidural hematoma, subarachnoid hemorrhage, or any other type of intraspinal or peri-dural bleeding); organ or nerve damage (i.e.: Any type of peripheral nerve, nerve root, or spinal cord injury) with  subsequent damage to sensory, motor, and/or autonomic systems, resulting in permanent pain, numbness, and/or weakness of one or several areas of the body; allergic reactions; (i.e.: anaphylactic reaction); and/or death. Furthermore, the  patient was informed of those risks and complications associated with the medications. These include, but are not limited to: allergic reactions (i.e.: anaphylactic or anaphylactoid reaction(s)); adrenal axis suppression; blood sugar elevation that in diabetics Brooking result in ketoacidosis or comma; water retention that in patients with history of congestive heart failure Ledwell result in shortness of breath, pulmonary edema, and decompensation with resultant heart failure; weight gain; swelling or edema; medication-induced neural toxicity; particulate matter embolism and blood vessel occlusion with resultant organ, and/or nervous system infarction; and/or aseptic necrosis of one or more joints. Finally, the patient was informed that Medicine is not an exact science; therefore, there is also the possibility of unforeseen or unpredictable risks and/or possible complications that Cassata result in a catastrophic outcome. The patient indicated having understood very clearly. We have given the patient no guarantees and we have made no promises. Enough time was given to the patient to ask questions, all of which were answered to the patient's satisfaction. Shelly Wright has indicated that she wanted to continue with the procedure. Attestation: I, the ordering provider, attest that I have discussed with the patient the benefits, risks, side-effects, alternatives, likelihood of achieving goals, and potential problems during recovery for the procedure that I have provided informed consent. Date: 08/14/2016; Time: 10:03 AM  Pre-Procedure Preparation:  Monitoring: As per clinic protocol. Respiration, ETCO2, SpO2, BP, heart rate and rhythm monitor placed and checked for adequate function Safety  Precautions: Patient was assessed for positional comfort and pressure points before starting the procedure. Time-out: I initiated and conducted the "Time-out" before starting the procedure, as per protocol. The patient was asked to participate by confirming the accuracy of the "Time Out" information. Verification of the correct person, site, and procedure were performed and confirmed by me, the nursing staff, and the patient. "Time-out" conducted as per Joint Commission's Universal Protocol (UP.01.01.01). "Time-out" Date & Time: 08/14/2016; 1033 hrs.  Description of Procedure #1 Process:   Time-out: "Time-out" completed before starting procedure, as per protocol. Position: Prone Target Area: For Lumbar Facet blocks, the target is the groove formed by the junction of the transverse process and superior articular process. For the L5 dorsal ramus, the target is the notch between superior articular process and sacral ala. For the S1 dorsal ramus, the target is the superior and lateral edge of the posterior S1 Sacral foramen. Approach: Paramedial approach. Area Prepped: Entire Posterior Lumbosacral Region Prepping solution: ChloraPrep (2% chlorhexidine gluconate and 70% isopropyl alcohol) Safety Precautions: Aspiration looking for blood return was conducted prior to all injections. At no point did we inject any substances, as a needle was being advanced. No attempts were made at seeking any paresthesias. Safe injection practices and needle disposal techniques used. Medications properly checked for expiration dates. SDV (single dose vial) medications used.  Description of the Procedure: Protocol guidelines were followed. The patient was placed in position over the fluoroscopy table. The target area was identified and the area prepped in the usual manner. Skin desensitized using vapocoolant spray. Skin & deeper tissues infiltrated with local anesthetic. Appropriate amount of time allowed to pass for local  anesthetics to take effect. The procedure needle was introduced through the skin, ipsilateral to the reported pain, and advanced to the target area. Employing the "Medial Branch Technique", the needles were advanced to the angle made by the superior and medial portion of the transverse process, and the lateral and inferior portion of the superior articulating process of the targeted vertebral bodies. This area is known as "  Burton's Eye" or the "Eye of the [place] Dog". A procedure needle was introduced through the skin, and this time advanced to the angle made by the superior and medial border of the sacral ala, and the lateral border of the S1 vertebral body. This last needle was later repositioned at the superior and lateral border of the posterior S1 foramen. Negative aspiration confirmed. Solution injected in intermittent fashion, asking for systemic symptoms every 0.5cc of injectate. The needles were then removed and the area cleansed, making sure to leave some of the prepping solution back to take advantage of its long term bactericidal properties. Start Time: 1033 hrs. Materials:  Needle(s) Type: Regular needle Gauge: 22G Length: 3.5-in Medication(s): We administered fentaNYL, midazolam, triamcinolone acetonide, triamcinolone acetonide, lidocaine (PF), ropivacaine (PF) 5 mg/mL (0.5%), ropivacaine (PF) 5 mg/mL (0.5%), lidocaine (PF), and ropivacaine (PF) 5 mg/mL (0.5%). Please see chart orders for dosing details.  Description of Procedure # 2 Process:   Position: Prone Target Area: For upper sacroiliac joint block(s), the target is the superior and posterior margin of the sacroiliac joint. Approach: Ipsilateral approach. Area Prepped: Entire Posterior Lumbosacral Region Prepping solution: ChloraPrep (2% chlorhexidine gluconate and 70% isopropyl alcohol) Safety Precautions: Aspiration looking for blood return was conducted prior to all injections. At no point did we inject any substances, as a  needle was being advanced. No attempts were made at seeking any paresthesias. Safe injection practices and needle disposal techniques used. Medications properly checked for expiration dates. SDV (single dose vial) medications used. Description of the Procedure: Protocol guidelines were followed. The patient was placed in position over the fluoroscopy table. The target area was identified and the area prepped in the usual manner. Skin desensitized using vapocoolant spray. Skin & deeper tissues infiltrated with local anesthetic. Appropriate amount of time allowed to pass for local anesthetics to take effect. The procedure needle was advanced under fluoroscopic guidance into the sacroiliac joint until a firm endpoint was obtained. Proper needle placement secured. Negative aspiration confirmed. Solution injected in intermittent fashion, asking for systemic symptoms every 0.5cc of injectate. The needles were then removed and the area cleansed, making sure to leave some of the prepping solution back to take advantage of its long term bactericidal properties. Vitals:   08/14/16 1050 08/14/16 1100 08/14/16 1110 08/14/16 1120  BP: 116/75 121/63 125/76 122/74  Pulse: 75 72 66 64  Resp: 14 18 16 14   Temp:      TempSrc:      SpO2: 94% 95% 96% 97%  Weight:      Height:        End Time: 1045 hrs. Materials:  Needle(s) Type: Regular needle Gauge: 22G Length: 3.5-in Medication(s): We administered fentaNYL, midazolam, triamcinolone acetonide, triamcinolone acetonide, lidocaine (PF), ropivacaine (PF) 5 mg/mL (0.5%), ropivacaine (PF) 5 mg/mL (0.5%), lidocaine (PF), and ropivacaine (PF) 5 mg/mL (0.5%). Please see chart orders for dosing details.  Imaging Guidance (Spinal):  Type of Imaging Technique: Fluoroscopy Guidance (Spinal) Indication(s): Assistance in needle guidance and placement for procedures requiring needle placement in or near specific anatomical locations not easily accessible without such  assistance. Exposure Time: Please see nurses notes. Contrast: None used. Fluoroscopic Guidance: I was personally present during the use of fluoroscopy. "Tunnel Vision Technique" used to obtain the best possible view of the target area. Parallax error corrected before commencing the procedure. "Direction-depth-direction" technique used to introduce the needle under continuous pulsed fluoroscopy. Once target was reached, antero-posterior, oblique, and lateral fluoroscopic projection used confirm needle placement in all  planes. Images permanently stored in EMR. Interpretation: No contrast injected. I personally interpreted the imaging intraoperatively. Adequate needle placement confirmed in multiple planes. Permanent images saved into the patient's record.  Antibiotic Prophylaxis:  Indication(s): None identified Antibiotic given: None  Post-operative Assessment:  EBL: None Complications: No immediate post-treatment complications observed by team, or reported by patient. Note: The patient tolerated the entire procedure well. A repeat set of vitals were taken after the procedure and the patient was kept under observation following institutional policy, for this type of procedure. Post-procedural neurological assessment was performed, showing return to baseline, prior to discharge. The patient was provided with post-procedure discharge instructions, including a section on how to identify potential problems. Should any problems arise concerning this procedure, the patient was given instructions to immediately contact us, at any time, without hesitation. In any case, we plan to contact the patient by telephone for a follow-up status report regarding this interventional procedure. Comments:  No additional relevant information.  Plan of Care  Disposition: Discharge home  Discharge Date & Time: 08/14/2016; 1120 hrs.  Physician-requested Follow-up:  Return in about 2 weeks (around 08/28/2016) for  Post-Procedure evaluation.  Future Appointments Date Time Provider Department Center  09/11/2016 1:30 PM Delano Metz, MD [facility] None   Medications ordered for procedure: Meds ordered this encounter  Medications  . fentaNYL (SUBLIMAZE) injection 25-50 mcg    Make sure Narcan is available in the pyxis when using this medication. In the event of respiratory depression (RR< 8/min): Titrate NARCAN (naloxone) in increments of 0.1 to 0.2 mg IV at 2-3 minute intervals, until desired degree of reversal.  . lactated ringers infusion 1,000 mL  . midazolam (VERSED) 5 MG/5ML injection 1-2 mg    Make sure Flumazenil is available in the pyxis when using this medication. If oversedation occurs, administer 0.2 mg IV over 15 sec. If after 45 sec no response, administer 0.2 mg again over 1 min; Necaise repeat at 1 min intervals; not to exceed 4 doses (1 mg)  . triamcinolone acetonide (KENALOG-40) injection 40 mg  . triamcinolone acetonide (KENALOG-40) injection 40 mg  . lidocaine (PF) (XYLOCAINE) 1 % injection 10 mL  . ropivacaine (PF) 5 mg/mL (0.5%) (NAROPIN) injection 5 mL    Preservative-free (MPF), single use vial.  . ropivacaine (PF) 5 mg/mL (0.5%) (NAROPIN) injection 5 mL    Preservative-free (MPF), single use vial.  . methylPREDNISolone acetate (DEPO-MEDROL) injection 80 mg  . lidocaine (PF) (XYLOCAINE) 1 % injection 10 mL  . ropivacaine (PF) 5 mg/mL (0.5%) (NAROPIN) injection 5 mL   Medications administered: We administered fentaNYL, midazolam, triamcinolone acetonide, triamcinolone acetonide, lidocaine (PF), ropivacaine (PF) 5 mg/mL (0.5%), ropivacaine (PF) 5 mg/mL (0.5%), lidocaine (PF), and ropivacaine (PF) 5 mg/mL (0.5%).  See the medical record for exact dosing, route, and time of administration.  Lab-work, Procedure(s), & Referral(s) Ordered: Orders Placed This Encounter  Procedures  . LUMBAR FACET(MEDIAL BRANCH NERVE BLOCK) MBNB  . SACROILIAC JOINT INJECTINS  . DG C-Arm 1-60 Min-No  Report  . Discharge instructions  . Follow-up  . Informed Consent Details: Transcribe to consent form and obtain patient signature  . Provider attestation of informed consent for procedure/surgical case  . Verify informed consent   Imaging Ordered: No results found for this or any previous visit. New Prescriptions   No medications on file   Primary Care Physician: [facility] Location: [facility] Note by: Ronie Barnhart A. Laban Emperor, M.D, DABA, DABAPM, DABPM, DABIPP, FIPP Date: 08/14/2016; Time:  1:30 PM  Disclaimer:  Medicine is not an Visual merchandiser. The only guarantee in medicine is that nothing is guaranteed. It is important to note that the decision to proceed with this intervention was based on the information collected from the patient. The Data and conclusions were drawn from the patient's questionnaire, the interview, and the physical examination. Because the information was provided in large part by the patient, it cannot be guaranteed that it has not been purposely or unconsciously manipulated. Every effort has been made to obtain as much relevant data as possible for this evaluation. It is important to note that the conclusions that lead to this procedure are derived in large part from the available data. Always take into account that the treatment will also be dependent on availability of resources and existing treatment guidelines, considered by other Pain Management Practitioners as being common knowledge and practice, at the time of the intervention. For Medico-Legal purposes, it is also important to point out that variation in procedural techniques and pharmacological choices are the acceptable norm. The indications, contraindications, technique, and results of the above procedure should only be interpreted and judged by a Board-Certified Interventional Pain Specialist with extensive familiarity and expertise in the same exact procedure and  technique. Attempts at providing opinions without similar or greater experience and expertise than that of the treating physician will be considered as inappropriate and unethical, and shall result in a formal complaint to the state medical board and applicable specialty societies.  Instructions provided at this appointment: Patient Instructions  Post-Procedure instructions Instructions:  Apply ice: Fill a plastic sandwich bag with crushed ice. Cover it with a small towel and apply to injection site. Apply for 15 minutes then remove x 15 minutes. Repeat sequence on day of procedure, until you go to bed. The purpose is to minimize swelling and discomfort after procedure.  Apply heat: Apply heat to procedure site starting the day following the procedure. The purpose is to treat any soreness and discomfort from the procedure.  Food intake: Start with clear liquids (like water) and advance to regular food, as tolerated.   Physical activities: Keep activities to a minimum for the first 8 hours after the procedure.   Driving: If you have received any sedation, you are not allowed to drive for 24 hours after your procedure.  Blood thinner: Restart your blood thinner 6 hours after your procedure. (Only for those taking blood thinners)  Insulin: As soon as you can eat, you Ambrosio resume your normal dosing schedule. (Only for those taking insulin)  Infection prevention: Keep procedure site clean and dry.  Post-procedure Pain Diary: Extremely important that this be done correctly and accurately. Recorded information will be used to determine the next step in treatment.  Pain evaluated is that of treated area only. Do not include pain from an untreated area.  Complete every hour, on the hour, for the initial 8 hours. Set an alarm to help you do this part accurately.  Do not go to sleep and have it completed later. It will not be accurate.  Follow-up appointment: Keep your follow-up appointment after  the procedure. Usually 2 weeks for most procedures. (6 weeks in the case of radiofrequency.) Bring you pain diary.  Expect:  From numbing medicine (AKA: Local Anesthetics): Numbness or decrease in pain.  Onset: Full effect within 15 minutes of injected.  Duration: It will depend on the type of local anesthetic used. On the average, 1 to 8 hours.   From steroids:  Decrease in swelling or inflammation. Once inflammation is improved, relief of the pain will follow.  Onset of benefits: Depends on the amount of swelling present. The more swelling, the longer it will take for the benefits to be seen.   Duration: Steroids will stay in the system x 2 weeks. Duration of benefits will depend on multiple posibilities including persistent irritating factors.  From procedure: Some discomfort is to be expected once the numbing medicine wears off. This should be minimal if ice and heat are applied as instructed. Call if:  You experience numbness and weakness that gets worse with time, as opposed to wearing off.  New onset bowel or bladder incontinence. (Spinal procedures only)  Emergency Numbers:  Durning business hours (Monday - Thursday, 8:00 AM - 4:00 PM) (Friday, 9:00 AM - 12:00 Noon): [phone]  After hours: [phone]   __________________________________________________________________________________________    Facet Joint Block The facet joints connect the bones of the spine (vertebrae). They make it possible for you to bend, twist, and make other movements with your spine. They also keep you from bending too far, twisting too far, and making other excessive movements. A facet joint block is a procedure where a numbing medicine (anesthetic) is injected into a facet joint. Often, a type of anti-inflammatory medicine called a steroid is also injected. A facet joint block Youtsey be done to diagnose neck or back pain. If the pain gets better after a facet joint block, it means the pain  is probably coming from the facet joint. If the pain does not get better, it means the pain is probably not coming from the facet joint. A facet joint block Steuart also be done to relieve neck or back pain caused by an inflamed facet joint. A facet joint block is only done to relieve pain if the pain does not improve with other methods, such as medicine, exercise programs, and physical therapy. Tell a health care provider about:  Any allergies you have.  All medicines you are taking, including vitamins, herbs, eye drops, creams, and over-the-counter medicines.  Any problems you or family members have had with anesthetic medicines.  Any blood disorders you have.  Any surgeries you have had.  Any medical conditions you have.  Whether you are pregnant or Erny be pregnant. What are the risks? Generally, this is a safe procedure. However, problems Vanwyk occur, including:  Bleeding.  Injury to a nerve near the injection site.  Pain at the injection site.  Weakness or numbness in areas controlled by nerves near the injection site.  Infection.  Temporary fluid retention.  Allergic reactions to medicines or dyes.  Injury to other structures or organs near the injection site. What happens before the procedure?  Follow instructions from your health care provider about eating or drinking restrictions.  Ask your health care provider about:  Changing or stopping your regular medicines. This is especially important if you are taking diabetes medicines or blood thinners.  Taking medicines such as aspirin and ibuprofen. These medicines can thin your blood. Do not take these medicines before your procedure if your health care provider instructs you not to.  Do not take any new dietary supplements or medicines without asking your health care provider first.  Plan to have someone take you home after the procedure. What happens during the procedure?  You Kapur need to remove your clothing and  dress in an open-back gown.  The procedure will be done while you are lying on an X-ray  table. You will most likely be asked to lie on your stomach, but you Tencza be asked to lie in a different position if an injection will be made in your neck.  Machines will be used to monitor your oxygen levels, heart rate, and blood pressure.  If an injection will be made in your neck, an IV tube will be inserted into one of your veins. Fluids and medicine will flow directly into your body through the IV tube.  The area over the facet joint where the injection will be made will be cleaned with soap. The surrounding skin will be covered with clean drapes.  A numbing medicine (local anesthetic) will be applied to your skin. Your skin Jefferys sting or burn for a moment.  A video X-ray machine (fluoroscopy) will be used to locate the joint. In some cases, a CT scan Centner be used.  A contrast dye Lammert be injected into the facet joint area to help locate the joint.  When the joint is located, an anesthetic will be injected into the joint through the needle.  Your health care provider will ask you whether you feel pain relief. If you do feel relief, a steroid Weinheimer be injected to provide pain relief for a longer period of time. If you do not feel relief or feel only partial relief, additional injections of an anesthetic Soule be made in other facet joints.  The needle will be removed.  Your skin will be cleaned.  A bandage (dressing) will be applied over each injection site. The procedure Hesser vary among health care providers and hospitals. What happens after the procedure?  You will be observed for 15-30 minutes before being allowed to go home. This information is not intended to replace advice given to you by your health care provider. Make sure you discuss any questions you have with your health care provider. Document Released: 09/25/2006 Document Revised: 06/07/2015 Document Reviewed: 01/30/2015 Elsevier  Interactive Patient Education  2017 [employer] Pain Management Discharge Instructions  General Discharge Instructions :  If you need to reach your doctor call: Monday-Friday 8:00 am - 4:00 pm at [phone] or toll free [phone].  After clinic hours [phone] to have operator reach doctor.  Bring all of your medication bottles to all your appointments in the pain clinic.  To cancel or reschedule your appointment with Pain Management please remember to call 24 hours in advance to avoid a fee.  Refer to the educational materials which you have been given on: General Risks, I had my Procedure. Discharge Instructions, Post Sedation.  Post Procedure Instructions:  The drugs you were given will stay in your system until tomorrow, so for the next 24 hours you should not drive, make any legal decisions or drink any alcoholic beverages.  You Woodhead eat anything you prefer, but it is better to start with liquids then soups and crackers, and gradually work up to solid foods.  Please notify your doctor immediately if you have any unusual bleeding, trouble breathing or pain that is not related to your normal pain.  Depending on the type of procedure that was done, some parts of your body Clute feel week and/or numb.  This usually clears up by tonight or the next day.  Walk with the use of an assistive device or accompanied by an adult for the 24 hours.  You Gracie use ice on the affected area for the first 24 hours.  Put ice in a Ziploc bag and cover with a towel  and place against area 15 minutes on 15 minutes off.  You Weldon switch to heat after 24 hours.

## 2016-08-14 NOTE — Patient Instructions (Addendum)
Post-Procedure instructions Instructions:  Apply ice: Fill a plastic sandwich bag with crushed ice. Cover it with a small towel and apply to injection site. Apply for 15 minutes then remove x 15 minutes. Repeat sequence on day of procedure, until you go to bed. The purpose is to minimize swelling and discomfort after procedure.  Apply heat: Apply heat to procedure site starting the day following the procedure. The purpose is to treat any soreness and discomfort from the procedure.  Food intake: Start with clear liquids (like water) and advance to regular food, as tolerated.   Physical activities: Keep activities to a minimum for the first 8 hours after the procedure.   Driving: If you have received any sedation, you are not allowed to drive for 24 hours after your procedure.  Blood thinner: Restart your blood thinner 6 hours after your procedure. (Only for those taking blood thinners)  Insulin: As soon as you can eat, you Cabriales resume your normal dosing schedule. (Only for those taking insulin)  Infection prevention: Keep procedure site clean and dry.  Post-procedure Pain Diary: Extremely important that this be done correctly and accurately. Recorded information will be used to determine the next step in treatment.  Pain evaluated is that of treated area only. Do not include pain from an untreated area.  Complete every hour, on the hour, for the initial 8 hours. Set an alarm to help you do this part accurately.  Do not go to sleep and have it completed later. It will not be accurate.  Follow-up appointment: Keep your follow-up appointment after the procedure. Usually 2 weeks for most procedures. (6 weeks in the case of radiofrequency.) Bring you pain diary.  Expect:  From numbing medicine (AKA: Local Anesthetics): Numbness or decrease in pain.  Onset: Full effect within 15 minutes of injected.  Duration: It will depend on the type of local anesthetic used. On the average, 1 to 8  hours.   From steroids: Decrease in swelling or inflammation. Once inflammation is improved, relief of the pain will follow.  Onset of benefits: Depends on the amount of swelling present. The more swelling, the longer it will take for the benefits to be seen.   Duration: Steroids will stay in the system x 2 weeks. Duration of benefits will depend on multiple posibilities including persistent irritating factors.  From procedure: Some discomfort is to be expected once the numbing medicine wears off. This should be minimal if ice and heat are applied as instructed. Call if:  You experience numbness and weakness that gets worse with time, as opposed to wearing off.  New onset bowel or bladder incontinence. (Spinal procedures only)  Emergency Numbers:  Durning business hours (Monday - Thursday, 8:00 AM - 4:00 PM) (Friday, 9:00 AM - 12:00 Noon): [phone]  After hours: [phone]   __________________________________________________________________________________________    Facet Joint Block The facet joints connect the bones of the spine (vertebrae). They make it possible for you to bend, twist, and make other movements with your spine. They also keep you from bending too far, twisting too far, and making other excessive movements. A facet joint block is a procedure where a numbing medicine (anesthetic) is injected into a facet joint. Often, a type of anti-inflammatory medicine called a steroid is also injected. A facet joint block Ferrelli be done to diagnose neck or back pain. If the pain gets better after a facet joint block, it means the pain is probably coming from the facet joint. If the   pain does not get better, it means the pain is probably not coming from the facet joint. A facet joint block Mcauliffe also be done to relieve neck or back pain caused by an inflamed facet joint. A facet joint block is only done to relieve pain if the pain does not improve with other methods, such as  medicine, exercise programs, and physical therapy. Tell a health care provider about:  Any allergies you have.  All medicines you are taking, including vitamins, herbs, eye drops, creams, and over-the-counter medicines.  Any problems you or family members have had with anesthetic medicines.  Any blood disorders you have.  Any surgeries you have had.  Any medical conditions you have.  Whether you are pregnant or Chenard be pregnant. What are the risks? Generally, this is a safe procedure. However, problems Dumire occur, including:  Bleeding.  Injury to a nerve near the injection site.  Pain at the injection site.  Weakness or numbness in areas controlled by nerves near the injection site.  Infection.  Temporary fluid retention.  Allergic reactions to medicines or dyes.  Injury to other structures or organs near the injection site. What happens before the procedure?  Follow instructions from your health care provider about eating or drinking restrictions.  Ask your health care provider about:  Changing or stopping your regular medicines. This is especially important if you are taking diabetes medicines or blood thinners.  Taking medicines such as aspirin and ibuprofen. These medicines can thin your blood. Do not take these medicines before your procedure if your health care provider instructs you not to.  Do not take any new dietary supplements or medicines without asking your health care provider first.  Plan to have someone take you home after the procedure. What happens during the procedure?  You Strome need to remove your clothing and dress in an open-back gown.  The procedure will be done while you are lying on an X-ray table. You will most likely be asked to lie on your stomach, but you Ungerer be asked to lie in a different position if an injection will be made in your neck.  Machines will be used to monitor your oxygen levels, heart rate, and blood pressure.  If an  injection will be made in your neck, an IV tube will be inserted into one of your veins. Fluids and medicine will flow directly into your body through the IV tube.  The area over the facet joint where the injection will be made will be cleaned with soap. The surrounding skin will be covered with clean drapes.  A numbing medicine (local anesthetic) will be applied to your skin. Your skin Wiehe sting or burn for a moment.  A video X-ray machine (fluoroscopy) will be used to locate the joint. In some cases, a CT scan Winquist be used.  A contrast dye Sproule be injected into the facet joint area to help locate the joint.  When the joint is located, an anesthetic will be injected into the joint through the needle.  Your health care provider will ask you whether you feel pain relief. If you do feel relief, a steroid Empey be injected to provide pain relief for a longer period of time. If you do not feel relief or feel only partial relief, additional injections of an anesthetic Robley be made in other facet joints.  The needle will be removed.  Your skin will be cleaned.  A bandage (dressing) will be applied over each injection site.   The procedure Cross vary among health care providers and hospitals. What happens after the procedure?  You will be observed for 15-30 minutes before being allowed to go home. This information is not intended to replace advice given to you by your health care provider. Make sure you discuss any questions you have with your health care provider. Document Released: 09/25/2006 Document Revised: 06/07/2015 Document Reviewed: 01/30/2015 [employer] Interactive Patient Education  2017 [employer] Pain Management Discharge Instructions  General Discharge Instructions :  If you need to reach your doctor call: Monday-Friday 8:00 am - 4:00 pm at [phone] or toll free [phone].  After clinic hours [phone] to have operator reach doctor.  Bring all of your medication bottles to  all your appointments in the pain clinic.  To cancel or reschedule your appointment with Pain Management please remember to call 24 hours in advance to avoid a fee.  Refer to the educational materials which you have been given on: General Risks, I had my Procedure. Discharge Instructions, Post Sedation.  Post Procedure Instructions:  The drugs you were given will stay in your system until tomorrow, so for the next 24 hours you should not drive, make any legal decisions or drink any alcoholic beverages.  You Kneip eat anything you prefer, but it is better to start with liquids then soups and crackers, and gradually work up to solid foods.  Please notify your doctor immediately if you have any unusual bleeding, trouble breathing or pain that is not related to your normal pain.  Depending on the type of procedure that was done, some parts of your body Ken feel week and/or numb.  This usually clears up by tonight or the next day.  Walk with the use of an assistive device or accompanied by an adult for the 24 hours.  You Magill use ice on the affected area for the first 24 hours.  Put ice in a Ziploc bag and cover with a towel and place against area 15 minutes on 15 minutes off.  You Rueda switch to heat after 24 hours. 

## 2016-08-15 ENCOUNTER — Telehealth: Payer: Self-pay | Admitting: *Deleted

## 2016-08-15 NOTE — Telephone Encounter (Signed)
voicemal left with patient to call our office re; procedure on yesterday if there are questions or concerns.

## 2016-08-19 NOTE — Progress Notes (Signed)
Test: Blood sugar (Glucose levels) Finding(s): High (hyperglycemia) Normal Level(s): Normal fasting (NPO x 8 hours) glucose levels are between 65-99 mg/dl, with 2 hour fasting, levels are usually less than 140 mg/dl. Clinical significance: Any random blood glucose level greater than 200 mg/dl is considered to be Diabetes. Signs and symptoms Highley include: (when persistently above 180 mg/dL) Increased thirst; headaches; trouble concentrating; blurred vision; frequent peeing (urination); fatigue (weakness and tired feeling); weight loss. The most common and classical symptoms of an undiagnosed diabetes with hyperglycemia are: Increased urinary frequency (polyuria), thirst (polydipsia), hunger (polyphagia), and unexplained weight loss; numbness in the extremities, pain in feet (dysesthesias), fatigue, and blurred vision; recurrent or severe infections; loss of consciousness or severe nausea/vomiting (ketoacidosis) or coma. Patient Recommendation(s): Fasting levels above 140 mg/dL or any levels above 200 mg/dL should follow-up with PCP (Primary Care Physician) for further evaluation. ___________________________________________________________________________________  

## 2016-09-11 ENCOUNTER — Ambulatory Visit: Payer: Medicaid Other | Attending: Pain Medicine | Admitting: Pain Medicine

## 2016-09-11 ENCOUNTER — Encounter: Payer: Self-pay | Admitting: Pain Medicine

## 2016-09-11 VITALS — BP 133/84 | HR 71 | Temp 98.3°F | Resp 16 | Ht 66.0 in | Wt 273.0 lb

## 2016-09-11 DIAGNOSIS — M19011 Primary osteoarthritis, right shoulder: Secondary | ICD-10-CM | POA: Insufficient documentation

## 2016-09-11 DIAGNOSIS — M797 Fibromyalgia: Secondary | ICD-10-CM | POA: Insufficient documentation

## 2016-09-11 DIAGNOSIS — M159 Polyosteoarthritis, unspecified: Secondary | ICD-10-CM

## 2016-09-11 DIAGNOSIS — G894 Chronic pain syndrome: Secondary | ICD-10-CM | POA: Diagnosis not present

## 2016-09-11 DIAGNOSIS — G8929 Other chronic pain: Secondary | ICD-10-CM | POA: Diagnosis not present

## 2016-09-11 DIAGNOSIS — M47816 Spondylosis without myelopathy or radiculopathy, lumbar region: Secondary | ICD-10-CM

## 2016-09-11 DIAGNOSIS — E669 Obesity, unspecified: Secondary | ICD-10-CM | POA: Insufficient documentation

## 2016-09-11 DIAGNOSIS — M533 Sacrococcygeal disorders, not elsewhere classified: Secondary | ICD-10-CM

## 2016-09-11 DIAGNOSIS — M25511 Pain in right shoulder: Secondary | ICD-10-CM | POA: Diagnosis not present

## 2016-09-11 DIAGNOSIS — M19012 Primary osteoarthritis, left shoulder: Secondary | ICD-10-CM | POA: Diagnosis not present

## 2016-09-11 DIAGNOSIS — Z79899 Other long term (current) drug therapy: Secondary | ICD-10-CM | POA: Insufficient documentation

## 2016-09-11 DIAGNOSIS — M545 Low back pain: Secondary | ICD-10-CM | POA: Diagnosis not present

## 2016-09-11 DIAGNOSIS — F17210 Nicotine dependence, cigarettes, uncomplicated: Secondary | ICD-10-CM | POA: Insufficient documentation

## 2016-09-11 DIAGNOSIS — M792 Neuralgia and neuritis, unspecified: Secondary | ICD-10-CM

## 2016-09-11 DIAGNOSIS — Z79891 Long term (current) use of opiate analgesic: Secondary | ICD-10-CM | POA: Diagnosis not present

## 2016-09-11 DIAGNOSIS — M1991 Primary osteoarthritis, unspecified site: Secondary | ICD-10-CM | POA: Insufficient documentation

## 2016-09-11 DIAGNOSIS — Z6841 Body Mass Index (BMI) 40.0 and over, adult: Secondary | ICD-10-CM | POA: Diagnosis not present

## 2016-09-11 DIAGNOSIS — M25512 Pain in left shoulder: Secondary | ICD-10-CM

## 2016-09-11 DIAGNOSIS — M4696 Unspecified inflammatory spondylopathy, lumbar region: Secondary | ICD-10-CM | POA: Diagnosis not present

## 2016-09-11 DIAGNOSIS — M150 Primary generalized (osteo)arthritis: Secondary | ICD-10-CM

## 2016-09-11 MED ORDER — TIZANIDINE HCL 4 MG PO TABS: 4.0000 mg | ORAL_TABLET | Freq: Four times a day (QID) | ORAL | 2 refills | Status: DC | PRN

## 2016-09-11 MED ORDER — DICLOFENAC SODIUM 75 MG PO TBEC: 75.0000 mg | DELAYED_RELEASE_TABLET | Freq: Two times a day (BID) | ORAL | 2 refills | Status: DC

## 2016-09-11 MED ORDER — GABAPENTIN 800 MG PO TABS: 800.0000 mg | ORAL_TABLET | Freq: Four times a day (QID) | ORAL | 2 refills | Status: DC

## 2016-09-11 NOTE — Progress Notes (Signed)
Safety precautions to be maintained throughout the outpatient stay will include: orient to surroundings, keep bed in low position, maintain call bell within reach at all times, provide assistance with transfer out of bed and ambulation.  

## 2016-09-11 NOTE — Progress Notes (Signed)
Patient's Name: Shelly Wright  MRN: [id number]  Referring Provider: [facility]  DOB: 04-Apr-1971  PCP: [facility]  DOS: 09/11/2016  Note by: Kathlen Brunswick. Dossie Arbour, MD  Service setting: Ambulatory outpatient  Specialty: Interventional Pain Management  Location: [facility] (AMB) Pain Management Facility    Patient type: Established   Primary Reason(s) for Visit: Encounter for prescription drug management & post-procedure evaluation of chronic illness with mild to moderate exacerbation(Level of risk: moderate) CC: Back Pain (lower)  HPI  Shelly Wright is a 46 y.o. year old, female patient, who comes today for a post-procedure evaluation and medication management. She has Anxiety and depression; Airway hyperreactivity; Bipolar 1 disorder ([place]); Cardiac conduction disorder; Chronic pain syndrome; DDD (degenerative disc disease), lumbar; Fibromyalgia; Personal history of perinatal problems; BP (high blood pressure); Irregular cardiac rhythm; Chronic low back pain (Location of Secondary source of pain) (Bilateral) (L>R); Lumbar canal stenosis (L3-4 and L4-5); Depression, major, single episode; Extreme obesity; Cervical post-laminectomy syndrome (Anterior-posterior approach) (2014); Osteoarthritis of knee (Left); Hypercholesterolemia without hypertriglyceridemia; Breathlessness on exertion; Avitaminosis D; Long term current use of anticoagulant therapy (Lovenox); Long term current use of opiate analgesic; Long term prescription opiate use; Opiate use; Opioid dependence, uncomplicated ([place]); Radiculopathy of cervical region (B)(R>L)(Thumb + Index fingers); Chronic shoulder pain (Location of Tertiary source of pain) (Bilateral) (L>R); S/P TKR Arthroplasty (Right); Chronic hip pain (Location of Primary Source of Pain) (Bilateral) (L>R); Chronic neck pain (Bilateral) (L>R); Chronic knee pain (Bilateral) (L>R); Marijuana use; 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 Her primarily concern today is the Back Pain (lower)  Pain Assessment: Self-Reported Pain Score: 1 /10             Reported level is compatible with observation.       Pain Type: Chronic pain Pain Location: Back Pain Orientation: Lower Pain Descriptors / Indicators:  (steady) Pain Frequency: Constant  Shelly Wright was last seen on 08/14/2016 for a procedure. During today's appointment we reviewed Shelly Wright's post-procedure results, as well as her outpatient medication regimen.  Further details on both, my assessment(s), as well as the proposed treatment plan, please see below.  Controlled Substance Pharmacotherapy Assessment REMS (Risk Evaluation and Mitigation Strategy)  Analgesic: Hydrocodone/APAP 5/325 one twice a day  MME/day: 10 mg/day.  Shelly Martins, RN  09/11/2016  2:28 PM  Sign at close encounter Safety precautions to be maintained throughout the outpatient stay will include: orient to surroundings, keep bed in low position, maintain call bell within reach at all times, provide assistance with transfer out of bed and ambulation.    Pharmacokinetics: Liberation and absorption (onset of action): WNL Distribution (time to peak effect): WNL Metabolism and excretion (duration of action): WNL         Pharmacodynamics: Desired effects: Analgesia: Shelly Wright reports >50% benefit. Functional ability: Patient reports that medication allows her to accomplish basic ADLs Clinically meaningful improvement in function (CMIF): Sustained CMIF goals met Perceived effectiveness: Described as relatively effective, allowing for increase in activities of daily living (ADL) Undesirable effects: Side-effects or Adverse reactions: None  reported Monitoring: [place] PMP: Online review of the past 48-monthperiod conducted. Compliant with practice rules and regulations List of all UDS test(s) done:  Lab Results  Component Value Date  SUMMARY FINAL 05/08/2016   Last UDS on record: No results found for: TOXASSSELUR UDS interpretation: Compliant          Medication Assessment Form: Reviewed. Patient indicates being compliant with therapy Treatment compliance: Compliant Risk Assessment Profile: Aberrant behavior: See prior evaluations. None observed or detected today Comorbid factors increasing risk of overdose: See prior notes. No additional risks detected today Risk of substance use disorder (SUD): Low Opioid Risk Tool (ORT) Total Score:    Interpretation Table:  Score <3 = Low Risk for SUD  Score between 4-7 = Moderate Risk for SUD  Score >8 = High Risk for Opioid Abuse   Risk Mitigation Strategies:  Patient Counseling: Covered Patient-Prescriber Agreement (PPA): Present and active  Notification to other healthcare providers: Done  Pharmacologic Plan: No change in therapy, at this time  Post-Procedure Assessment  08/14/2016 Procedure: Diagnostic bilateral lumbar facet block + SI Block, under fluoroscopic guidance and IV sedation Pre-procedure pain score:  0/10 Post-procedure pain score: 0/10 (100% relief) Influential Factors: BMI: 44.06 kg/m Intra-procedural challenges: Increased level of difficulty due to Shelly Wright's obesity 44.06 kg/m Assessment challenges: Inadequate record keeping by Shelly Wright Lack of understanding of post-procedural reporting, despite initial explanations Post-procedural side-effects, adverse reactions, or complications: None reported Reported issues: None  Sedation: Sedation provided. When no sedatives are used, the analgesic levels obtained are directly associated to the effectiveness of the local anesthetics. However, when sedation is provided, the level of analgesia obtained during the  initial 1 hour following the intervention, is believed to be the result of a combination of factors. These factors Moltz include, but are not limited to: 1. The effectiveness of the local anesthetics used. 2. The effects of the analgesic(s) and/or anxiolytic(s) used. 3. The degree of discomfort experienced by the patient at the time of the procedure. 4. The patients ability and reliability in recalling and recording the events. 5. The presence and influence of possible secondary gains and/or psychosocial factors. Reported result: Relief experienced during the 1st hour after the procedure:  (does not remember) (Ultra-Short Term Relief) Interpretative annotation: Poor patient recollection. Patient does not appear to have understood instructions on differential evaluation of treated vs untreated area, leading to an inaccurate global report  Effects of local anesthetic: The analgesic effects attained during this period are directly associated to the localized infiltration of local anesthetics and therefore cary significant diagnostic value as to the etiological location, or anatomical origin, of the pain. Expected duration of relief is directly dependent on the pharmacodynamics of the local anesthetic used. Long-acting (4-6 hours) anesthetics used.  Reported result: Relief during the next 4 to 6 hour after the procedure:  (does not remember) (Short-Term Relief) Interpretative annotation: Patient did not keep pain diary. Patient does not appear to have understood instructions on differential evaluation of treated vs untreated area, leading to an inaccurate global report  Long-term benefit: Defined as the period of time past the expected duration of local anesthetics. With the possible exception of prolonged sympathetic blockade from the local anesthetics, benefits during this period are typically attributed to, or associated with, other factors such as analgesic sensory neuropraxia, antiinflammatory effects,  or beneficial biochemical changes provided by agents other than the local anesthetics Reported result: Extended relief following procedure: 40 % (Long-Term Relief) Interpretative annotation: Partial relief. This could suggest the algesic mechanism to be a combination of tissue inflammation and mechanical problems.          Current benefits: Defined as persistent relief that continues at  this point in time.   Reported results: Treated area: <50 % Ms. Gehlhausen reports improvement in function Interpretative annotation: Ongoing benefits would suggest effective therapeutic approach  Interpretation: Results would suggest a successful diagnostic intervention.          Laboratory Chemistry  Inflammation Markers Lab Results  Component Value Date   CRP <0.80 05/06/2016   ESRSEDRATE 2 05/06/2016   (CRP: Acute Phase) (ESR: Chronic Phase) Renal Function Markers Lab Results  Component Value Date   BUN 17 05/06/2016   CREATININE 0.96 05/06/2016   GFRAA >60 05/06/2016   GFRNONAA >60 05/06/2016   Hepatic Function Markers Lab Results  Component Value Date   AST 23 05/06/2016   ALT 27 05/06/2016   ALBUMIN 4.5 05/06/2016   ALKPHOS 86 05/06/2016   Electrolytes Lab Results  Component Value Date   NA 136 05/06/2016   K 3.9 05/06/2016   CL 100 (L) 05/06/2016   CALCIUM 9.3 05/06/2016   MG 2.1 05/06/2016   Neuropathy Markers Lab Results  Component Value Date   VITAMINB12 319 05/06/2016   Bone Pathology Markers Lab Results  Component Value Date   ALKPHOS 86 05/06/2016   25OHVITD1 56 05/06/2016   25OHVITD2 9.2 05/06/2016   25OHVITD3 47 05/06/2016   CALCIUM 9.3 05/06/2016   Coagulation Parameters Lab Results  Component Value Date   INR 0.93 05/06/2016   LABPROT 12.5 05/06/2016   APTT 29 05/06/2016   PLT 287 05/06/2016   Cardiovascular Markers Lab Results  Component Value Date   HGB 15.4 05/06/2016   HCT 45.5 05/06/2016   Note: Lab results reviewed.  Recent Diagnostic Imaging  Review  Dg C-arm 1-60 Min-no Report  Result Date: 08/14/2016 Fluoroscopy was utilized by the requesting physician.  No radiographic interpretation.   Note: Imaging results reviewed.          Meds  The patient has a current medication list which includes the following prescription(s): albuterol, aripiprazole, diclofenac, gabapentin, hydrochlorothiazide, hydroxyzine, simvastatin, tizanidine, trazodone, and venlafaxine xr.  Current Outpatient Prescriptions on File Prior to Visit  Medication Sig  . albuterol (VENTOLIN HFA) 108 (90 Base) MCG/ACT inhaler TAKE 2 PUFFS INTO LUNGS EVERY 6 HOURS ASNEEDED FOR WHEEZING  . ARIPiprazole (ABILIFY) 10 MG tablet Take 10 mg by mouth daily.  . hydrochlorothiazide (HYDRODIURIL) 25 MG tablet Take 25 mg by mouth daily.   . hydrOXYzine (ATARAX/VISTARIL) 50 MG tablet Take 50 mg by mouth 3 (three) times daily.  . simvastatin (ZOCOR) 20 MG tablet Take 20 mg by mouth daily at 6 PM.  . traZODone (DESYREL) 150 MG tablet Take 150 mg by mouth at bedtime as needed.   . venlafaxine XR (EFFEXOR-XR) 75 MG 24 hr capsule TAKE 3 CAPSULES BY MOUTH EVERY MORNING   No current facility-administered medications on file prior to visit.    ROS  Constitutional: Denies any fever or chills Gastrointestinal: No reported hemesis, hematochezia, vomiting, or acute GI distress Musculoskeletal: Denies any acute onset joint swelling, redness, loss of ROM, or weakness Neurological: No reported episodes of acute onset apraxia, aphasia, dysarthria, agnosia, amnesia, paralysis, loss of coordination, or loss of consciousness  Allergies  Ms. Vanlanen has No Known Allergies.  PFSH  Drug: Ms. Teasdale  reports that she does not use drugs. Alcohol:  reports that she does not drink alcohol. Tobacco:  reports that she has been smoking Cigarettes.  She has been smoking about 1.00 pack per day. She has never used smokeless tobacco. Medical:  has a past medical history of  Acute right-sided low back pain with  right-sided sciatica (08/30/2014); Anxiety; Arthritis; Asthma; Bipolar disorder ([place]); Cervical spinal cord compression (HCC) (05/07/2012); DDD (degenerative disc disease), lumbar; Depression; Fibromyalgia; Hypertension; Neuritis or radiculitis due to rupture of lumbar intervertebral disc (05/23/2014); Periprosthetic fracture around prosthetic knee (07/31/2015); Restless leg syndrome; Soft tissue lesion of shoulder region (01/24/2012); Status post revision of total replacement of right knee (09/18/2015); Status post right knee replacement (02/01/2015); Syncope and collapse (04/08/2014); Thoracic and lumbosacral neuritis (05/07/2012); and Thoracic neuritis (05/07/2012). Family: family history includes Breast cancer in her mother; Colon cancer in her father; Heart attack in her father.  Past Surgical History:  Procedure Laterality Date  . ABDOMINAL HYSTERECTOMY    . Cervical Fusion X 2    . JOINT REPLACEMENT Right 01/2016   Dr Jefm Bryant  . KNEE ARTHROSCOPY Bilateral    Right knee scope 1998, Left knee scope  . TONSILLECTOMY    . TOTAL KNEE ARTHROPLASTY Right 02/01/2015   Procedure: TOTAL KNEE ARTHROPLASTY;  Surgeon: Leanor Kail, MD;  Location: [facility] ORS;  Service: Orthopedics;  Laterality: Right;  . TUBAL LIGATION     Constitutional Exam  General appearance: Well nourished, well developed, and well hydrated. In no apparent acute distress Vitals:   09/11/16 1423  BP: 133/84  Pulse: 71  Resp: 16  Temp: 98.3 F (36.8 C)  TempSrc: Oral  SpO2: 100%  Weight: 273 lb (123.8 kg)  Height: '5\' 6"'  (1.676 m)   BMI Assessment: Estimated body mass index is 44.06 kg/m as calculated from the following:   Height as of this encounter: '5\' 6"'  (1.676 m).   Weight as of this encounter: 273 lb (123.8 kg).  BMI interpretation table: BMI level Category Range association with higher incidence of chronic pain  <18 kg/m2 Underweight   18.5-24.9 kg/m2 Ideal body weight   25-29.9 kg/m2 Overweight Increased incidence by  20%  30-34.9 kg/m2 Obese (Class I) Increased incidence by 68%  35-39.9 kg/m2 Severe obesity (Class II) Increased incidence by 136%  >40 kg/m2 Extreme obesity (Class III) Increased incidence by 254%   BMI Readings from Last 4 Encounters:  09/11/16 44.06 kg/m  08/14/16 46.32 kg/m  07/09/16 45.68 kg/m  05/06/16 46.81 kg/m   Wt Readings from Last 4 Encounters:  09/11/16 273 lb (123.8 kg)  08/14/16 287 lb (130.2 kg)  07/09/16 283 lb (128.4 kg)  05/06/16 290 lb (131.5 kg)  Psych/Mental status: Alert, oriented x 3 (person, place, & time)       Eyes: PERLA Respiratory: No evidence of acute respiratory distress  Cervical Spine Exam  Inspection: No masses, redness, or swelling Alignment: Symmetrical Functional ROM: Unrestricted ROM      Stability: No instability detected Muscle strength & Tone: Functionally intact Sensory: Unimpaired Palpation: No palpable anomalies              Upper Extremity (UE) Exam    Side: Right upper extremity  Side: Left upper extremity  Inspection: No masses, redness, swelling, or asymmetry. No contractures  Inspection: No masses, redness, swelling, or asymmetry. No contractures  Functional ROM: Unrestricted ROM          Functional ROM: Unrestricted ROM          Muscle strength & Tone: Functionally intact  Muscle strength & Tone: Functionally intact  Sensory: Unimpaired  Sensory: Unimpaired  Palpation: No palpable anomalies              Palpation: No palpable anomalies  Specialized Test(s): Deferred         Specialized Test(s): Deferred          Thoracic Spine Exam  Inspection: No masses, redness, or swelling Alignment: Symmetrical Functional ROM: Unrestricted ROM Stability: No instability detected Sensory: Unimpaired Muscle strength & Tone: No palpable anomalies  Lumbar Spine Exam  Inspection: No masses, redness, or swelling Alignment: Symmetrical Functional ROM: Decreased ROM      Stability: No instability detected Muscle strength  & Tone: Functionally intact Sensory: Movement-associated pain Palpation: Complains of area being tender to palpation       Provocative Tests: Lumbar Hyperextension and rotation test: Positive bilaterally for facet joint pain. Patrick's Maneuver: Positive for bilateral S-I arthralgia              Gait & Posture Assessment  Ambulation: Unassisted Gait: Relatively normal for age and body habitus Posture: WNL   Lower Extremity Exam    Side: Right lower extremity  Side: Left lower extremity  Inspection: No masses, redness, swelling, or asymmetry. No contractures  Inspection: No masses, redness, swelling, or asymmetry. No contractures  Functional ROM: Unrestricted ROM          Functional ROM: Unrestricted ROM          Muscle strength & Tone: Functionally intact  Muscle strength & Tone: Functionally intact  Sensory: Unimpaired  Sensory: Unimpaired  Palpation: No palpable anomalies  Palpation: No palpable anomalies   Assessment  Primary Diagnosis & Pertinent Problem List: The primary encounter diagnosis was Chronic sacroiliac joint pain (Location of Primary Source of Pain) (Bilateral) (L>R). Diagnoses of Chronic low back pain (Location of Secondary source of pain) (Bilateral) (L>R), Chronic shoulder pain (Location of Tertiary source of pain) (Bilateral) (L>R), Lumbar facet syndrome (Bilateral) (L>R), Neurogenic pain, Fibromyalgia, and Primary osteoarthritis involving multiple joints were also pertinent to this visit.  Status Diagnosis  Controlled Controlled Controlled 1. Chronic sacroiliac joint pain (Location of Primary Source of Pain) (Bilateral) (L>R)   2. Chronic low back pain (Location of Secondary source of pain) (Bilateral) (L>R)   3. Chronic shoulder pain (Location of Tertiary source of pain) (Bilateral) (L>R)   4. Lumbar facet syndrome (Bilateral) (L>R)   5. Neurogenic pain   6. Fibromyalgia   7. Primary osteoarthritis involving multiple joints      Plan of Care  Pharmacotherapy  (Medications Ordered): Meds ordered this encounter  Medications  . tiZANidine (ZANAFLEX) 4 MG tablet    Sig: Take 1 tablet (4 mg total) by mouth every 6 (six) hours as needed for muscle spasms.    Dispense:  120 tablet    Refill:  2    Do not place medication on "Automatic Refill". Fill one day early if pharmacy is closed on scheduled refill date.  . gabapentin (NEURONTIN) 800 MG tablet    Sig: Take 1 tablet (800 mg total) by mouth every 6 (six) hours.    Dispense:  120 tablet    Refill:  2    Do not place this medication, or any other prescription from our practice, on "Automatic Refill". Patient Orzechowski have prescription filled one day early if pharmacy is closed on scheduled refill date.  . diclofenac (VOLTAREN) 75 MG EC tablet    Sig: Take 1 tablet (75 mg total) by mouth 2 (two) times daily.    Dispense:  60 tablet    Refill:  2    Do not place medication on "Automatic Refill". Fill one day early if pharmacy is closed on scheduled  refill date.   New Prescriptions   No medications on file   Medications administered today: Ms. Kam had no medications administered during this visit. Lab-work, procedure(s), and/or referral(s): Orders Placed This Encounter  Procedures  . LUMBAR FACET(MEDIAL BRANCH NERVE BLOCK) MBNB  . SACROILIAC JOINT INJECTINS  . Ambulatory referral to Physical Therapy   Imaging and/or referral(s): AMB REFERRAL TO PHYSICAL THERAPY  Interventional therapies: Planned, scheduled, and/or pending:   Diagnostic bilateral lumbar facet block #2 + Diagnostic bilateral intra-articular S-I joint #2 injection   Considering:   Diagnostic bilateral lumbar facet block Possible bilateral lumbar facet RFA Diagnostic bilateral intra-articular S-I joint injection  Possible bilateral S-I joint RFA Diagnostic bilateral intra-articular shoulder joint injection Diagnostic bilateral suprascapular nerve block Possible bilateral suprascapular RFA Diagnostic left intra-articular knee  joint injection  Possible series of 5 Hyalgan intra-articular left knee joint injections  Diagnostic bilateral genicular nerve blocks  Possible bilateral genicular nerve RFA Diagnostic bilateral cervical facet blocks Possible bilateral cervical facet RFA Diagnostic left cervical epidural steroid injection   Palliative PRN treatment(s):   Diagnostic bilateral lumbar facet block + Diagnostic bilateral intra-articular S-I joint injection   Provider-requested follow-up: Return for Procedure: Bilateral Lumbar Fct + SI Blk, (ASAP), by MD.  Future Appointments Date Time Provider [place]  09/23/2016 8:00 AM Milinda Pointer, MD [facility] None   Primary Care Physician: [facility] Location: [facility] Note by: Kathlen Brunswick. Dossie Arbour, M.D, DABA, DABAPM, DABPM, DABIPP, FIPP Date: 09/11/2016; Time: 4:09 PM  Patient instructions provided during this appointment: Patient Instructions   Preparing for Procedure with Sedation Instructions: . Oral Intake: Do not eat or drink anything for at least 8 hours prior to your procedure. . Transportation: Public transportation is not allowed. Bring an adult driver. The driver must be physically present in our waiting room before any procedure can be started. Marland Kitchen Physical Assistance: Bring an adult physically capable of assisting you, in the event you need help. This adult should keep you company at home for at least 6 hours after the procedure. . Blood Pressure Medicine: Take your blood pressure medicine with a sip of water the morning of the procedure. . Blood thinners:  . Diabetics on insulin: Notify the staff so that you can be scheduled 1st case in the morning. If your diabetes requires high dose insulin, take only  of your normal insulin dose the morning of the procedure and notify the staff that you have done so. . Preventing infections: Shower with an antibacterial soap the morning of your  procedure. . Build-up your immune system: Take 1000 mg of Vitamin C with every meal (3 times a day) the day prior to your procedure. Marland Kitchen Antibiotics: Inform the staff if you have a condition or reason that requires you to take antibiotics before dental procedures. . Pregnancy: If you are pregnant, call and cancel the procedure. . Sickness: If you have a cold, fever, or any active infections, call and cancel the procedure. . Arrival: You must be in the facility at least 30 minutes prior to your scheduled procedure. . Children: Do not bring children with you. . Dress appropriately: Bring dark clothing that you would not mind if they get stained. . Valuables: Do not bring any jewelry or valuables. Procedure appointments are reserved for interventional treatments only. Marland Kitchen No Prescription Refills. . No medication changes will be discussed during procedure appointments. . No disability issues will be discussed. ______________________________________________________________________________________________  GENERAL RISKS AND COMPLICATIONS  What are the  risk, side effects and possible complications? Generally speaking, most procedures are safe.  However, with any procedure there are risks, side effects, and the possibility of complications.  The risks and complications are dependent upon the sites that are lesioned, or the type of nerve block to be performed.  The closer the procedure is to the spine, the more serious the risks are.  Great care is taken when placing the radio frequency needles, block needles or lesioning probes, but sometimes complications can occur. 1. Infection: Any time there is an injection through the skin, there is a risk of infection.  This is why sterile conditions are used for these blocks.  There are four possible types of infection. 1. Localized skin infection. 2. Central Nervous System Infection-This can be in the form of Meningitis, which can be deadly. 3. Epidural  Infections-This can be in the form of an epidural abscess, which can cause pressure inside of the spine, causing compression of the spinal cord with subsequent paralysis. This would require an emergency surgery to decompress, and there are no guarantees that the patient would recover from the paralysis. 4. Discitis-This is an infection of the intervertebral discs.  It occurs in about 1% of discography procedures.  It is difficult to treat and it Garron lead to surgery.        2. Pain: the needles have to go through skin and soft tissues, will cause soreness.       3. Damage to internal structures:  The nerves to be lesioned Lyttle be near blood vessels or    other nerves which can be potentially damaged.       4. Bleeding: Bleeding is more common if the patient is taking blood thinners such as  aspirin, Coumadin, Ticiid, Plavix, etc., or if he/she have some genetic predisposition  such as hemophilia. Bleeding into the spinal canal can cause compression of the spinal  cord with subsequent paralysis.  This would require an emergency surgery to  decompress and there are no guarantees that the patient would recover from the  paralysis.       5. Pneumothorax:  Puncturing of a lung is a possibility, every time a needle is introduced in  the area of the chest or upper back.  Pneumothorax refers to free air around the  collapsed lung(s), inside of the thoracic cavity (chest cavity).  Another two possible  complications related to a similar event would include: Hemothorax and Chylothorax.   These are variations of the Pneumothorax, where instead of air around the collapsed  lung(s), you Horen have blood or chyle, respectively.       6. Spinal headaches: They Albritton occur with any procedures in the area of the spine.       7. Persistent CSF (Cerebro-Spinal Fluid) leakage: This is a rare problem, but Resch occur  with prolonged intrathecal or epidural catheters either due to the formation of a fistulous  track or a dural tear.        8. Nerve damage: By working so close to the spinal cord, there is always a possibility of  nerve damage, which could be as serious as a permanent spinal cord injury with  paralysis.       9. Death:  Although rare, severe deadly allergic reactions known as "Anaphylactic  reaction" can occur to any of the medications used.      10. Worsening of the symptoms:  We can always make thing worse.  What are the chances of something  like this happening? Chances of any of this occuring are extremely low.  By statistics, you have more of a chance of getting killed in a motor vehicle accident: while driving to the hospital than any of the above occurring .  Nevertheless, you should be aware that they are possibilities.  In general, it is similar to taking a shower.  Everybody knows that you can slip, hit your head and get killed.  Does that mean that you should not shower again?  Nevertheless always keep in mind that statistics do not mean anything if you happen to be on the wrong side of them.  Even if a procedure has a 1 (one) in a 1,000,000 (million) chance of going wrong, it you happen to be that one..Also, keep in mind that by statistics, you have more of a chance of having something go wrong when taking medications.  Who should not have this procedure? If you are on a blood thinning medication (e.g. Coumadin, Plavix, see list of "Blood Thinners"), or if you have an active infection going on, you should not have the procedure.  If you are taking any blood thinners, please inform your physician.  How should I prepare for this procedure?  Do not eat or drink anything at least six hours prior to the procedure.  Bring a driver with you .  It cannot be a taxi.  Come accompanied by an adult that can drive you back, and that is strong enough to help you if your legs get weak or numb from the local anesthetic.  Take all of your medicines the morning of the procedure with just enough water to swallow them.  If  you have diabetes, make sure that you are scheduled to have your procedure done first thing in the morning, whenever possible.  If you have diabetes, take only half of your insulin dose and notify our nurse that you have done so as soon as you arrive at the clinic.  If you are diabetic, but only take blood sugar pills (oral hypoglycemic), then do not take them on the morning of your procedure.  You Medico take them after you have had the procedure.  Do not take aspirin or any aspirin-containing medications, at least eleven (11) days prior to the procedure.  They Cala prolong bleeding.  Wear loose fitting clothing that Kleppe be easy to take off and that you would not mind if it got stained with Betadine or blood.  Do not wear any jewelry or perfume  Remove any nail coloring.  It will interfere with some of our monitoring equipment.  NOTE: Remember that this is not meant to be interpreted as a complete list of all possible complications.  Unforeseen problems Odonovan occur.  BLOOD THINNERS The following drugs contain aspirin or other products, which can cause increased bleeding during surgery and should not be taken for 2 weeks prior to and 1 week after surgery.  If you should need take something for relief of minor pain, you Rosengrant take acetaminophen which is found in Tylenol,m Datril, Anacin-3 and Panadol. It is not blood thinner. The products listed below are.  Do not take any of the products listed below in addition to any listed on your instruction sheet.  A.P.C or A.P.C with Codeine Codeine Phosphate Capsules #3 Ibuprofen Ridaura  ABC compound Congesprin Imuran rimadil  Advil Cope Indocin Robaxisal  Alka-Seltzer Effervescent Pain Reliever and Antacid Coricidin or Coricidin-D  Indomethacin Rufen  Alka-Seltzer plus Cold Medicine Cosprin Ketoprofen S-A-C  Tablets  Anacin Analgesic Tablets or Capsules Coumadin Korlgesic Salflex  Anacin Extra Strength Analgesic tablets or capsules CP-2 Tablets Lanoril  Salicylate  Anaprox Cuprimine Capsules Levenox Salocol  Anexsia-D Dalteparin Magan Salsalate  Anodynos Darvon compound Magnesium Salicylate Sine-off  Ansaid Dasin Capsules Magsal Sodium Salicylate  Anturane Depen Capsules Marnal Soma  APF Arthritis pain formula Dewitt's Pills Measurin Stanback  Argesic Dia-Gesic Meclofenamic Sulfinpyrazone  Arthritis Bayer Timed Release Aspirin Diclofenac Meclomen Sulindac  Arthritis pain formula Anacin Dicumarol Medipren Supac  Analgesic (Safety coated) Arthralgen Diffunasal Mefanamic Suprofen  Arthritis Strength Bufferin Dihydrocodeine Mepro Compound Suprol  Arthropan liquid Dopirydamole Methcarbomol with Aspirin Synalgos  ASA tablets/Enseals Disalcid Micrainin Tagament  Ascriptin Doan's Midol Talwin  Ascriptin A/D Dolene Mobidin Tanderil  Ascriptin Extra Strength Dolobid Moblgesic Ticlid  Ascriptin with Codeine Doloprin or Doloprin with Codeine Momentum Tolectin  Asperbuf Duoprin Mono-gesic Trendar  Aspergum Duradyne Motrin or Motrin IB Triminicin  Aspirin plain, buffered or enteric coated Durasal Myochrisine Trigesic  Aspirin Suppositories Easprin Nalfon Trillsate  Aspirin with Codeine Ecotrin Regular or Extra Strength Naprosyn Uracel  Atromid-S Efficin Naproxen Ursinus  Auranofin Capsules Elmiron Neocylate Vanquish  Axotal Emagrin Norgesic Verin  Azathioprine Empirin or Empirin with Codeine Normiflo Vitamin E  Azolid Emprazil Nuprin Voltaren  Bayer Aspirin plain, buffered or children's or timed BC Tablets or powders Encaprin Orgaran Warfarin Sodium  Buff-a-Comp Enoxaparin Orudis Zorpin  Buff-a-Comp with Codeine Equegesic Os-Cal-Gesic   Buffaprin Excedrin plain, buffered or Extra Strength Oxalid   Bufferin Arthritis Strength Feldene Oxphenbutazone   Bufferin plain or Extra Strength Feldene Capsules Oxycodone with Aspirin   Bufferin with Codeine Fenoprofen Fenoprofen Pabalate or Pabalate-SF   Buffets II Flogesic Panagesic   Buffinol plain or  Extra Strength Florinal or Florinal with Codeine Panwarfarin   Buf-Tabs Flurbiprofen Penicillamine   Butalbital Compound Four-way cold tablets Penicillin   Butazolidin Fragmin Pepto-Bismol   Carbenicillin Geminisyn Percodan   Carna Arthritis Reliever Geopen Persantine   Carprofen Gold's salt Persistin   Chloramphenicol Goody's Phenylbutazone   Chloromycetin Haltrain Piroxlcam   Clmetidine heparin Plaquenil   Cllnoril Hyco-pap Ponstel   Clofibrate Hydroxy chloroquine Propoxyphen         Before stopping any of these medications, be sure to consult the physician who ordered them.  Some, such as Coumadin (Warfarin) are ordered to prevent or treat serious conditions such as "deep thrombosis", "pumonary embolisms", and other heart problems.  The amount of time that you Shimel need off of the medication Ekstein also vary with the medication and the reason for which you were taking it.  If you are taking any of these medications, please make sure you notify your pain physician before you undergo any procedures.         Sacroiliac (SI) Joint Injection Patient Information  Description: The sacroiliac joint connects the scrum (very low back and tailbone) to the ilium (a pelvic bone which also forms half of the hip joint).  Normally this joint experiences very little motion.  When this joint becomes inflamed or unstable low back and or hip and pelvis pain Yuille result.  Injection of this joint with local anesthetics (numbing medicines) and steroids can provide diagnostic information and reduce pain.  This injection is performed with the aid of x-ray guidance into the tailbone area while you are lying on your stomach.   You Landin experience an electrical sensation down the leg while this is being done.  You Uhlig also experience numbness.  We also Kivi ask if we are reproducing  your normal pain during the injection.  Conditions which Boerema be treated SI injection:   Low back, buttock, hip or leg  pain  Preparation for the Injection:  1. Do not eat any solid food or dairy products within 8 hours of your appointment.  2. You Klees drink clear liquids up to 3 hours before appointment.  Clear liquids include water, black coffee, juice or soda.  No milk or cream please. 3. You Bodi take your regular medications, including pain medications with a sip of water before your appointment.  Diabetics should hold regular insulin (if take separately) and take 1/2 normal NPH dose the morning of the procedure.  Carry some sugar containing items with you to your appointment. 4. A driver must accompany you and be prepared to drive you home after your procedure. 5. Bring all of your current medications with you. 6. An IV Lucero be inserted and sedation Mandley be given at the discretion of the physician. 7. A blood pressure cuff, EKG and other monitors will often be applied during the procedure.  Some patients Kronberg need to have extra oxygen administered for a short period.  8. You will be asked to provide medical information, including your allergies, prior to the procedure.  We must know immediately if you are taking blood thinners (like Coumadin/Warfarin) or if you are allergic to IV iodine contrast (dye).  We must know if you could possible be pregnant.  Possible side effects:   Bleeding from needle site  Infection (rare, Antos require surgery)  Nerve injury (rare)  Numbness & tingling (temporary)  A brief convulsion or seizure  Light-headedness (temporary)  Pain at injection site (several days)  Decreased blood pressure (temporary)  Weakness in the leg (temporary)   Call if you experience:   New onset weakness or numbness of an extremity below the injection site that last more than 8 hours.  Hives or difficulty breathing ( go to the emergency room)  Inflammation or drainage at the injection site  Any new symptoms which are concerning to you  Please note:  Although the local anesthetic  injected can often make your back/ hip/ buttock/ leg feel good for several hours after the injections, the pain will likely return.  It takes 3-7 days for steroids to work in the sacroiliac area.  You Ihrig not notice any pain relief for at least that one week.  If effective, we will often do a series of three injections spaced 3-6 weeks apart to maximally decrease your pain.  After the initial series, we generally will wait some months before a repeat injection of the same type.  If you have any questions, please call [phone] [facility]   Facet Joint Block The facet joints connect the bones of the spine (vertebrae). They make it possible for you to bend, twist, and make other movements with your spine. They also keep you from bending too far, twisting too far, and making other excessive movements. A facet joint block is a procedure where a numbing medicine (anesthetic) is injected into a facet joint. Often, a type of anti-inflammatory medicine called a steroid is also injected. A facet joint block Leyendecker be done to diagnose neck or back pain. If the pain gets better after a facet joint block, it means the pain is probably coming from the facet joint. If the pain does not get better, it means the pain is probably not coming from the facet joint. A facet joint block  Bray also be done to relieve neck or back pain caused by an inflamed facet joint. A facet joint block is only done to relieve pain if the pain does not improve with other methods, such as medicine, exercise programs, and physical therapy. Tell a health care provider about:  Any allergies you have.  All medicines you are taking, including vitamins, herbs, eye drops, creams, and over-the-counter medicines.  Any problems you or family members have had with anesthetic medicines.  Any blood disorders you have.  Any surgeries you have had.  Any medical conditions you have.  Whether you are pregnant or  Barua be pregnant. What are the risks? Generally, this is a safe procedure. However, problems Pangle occur, including:  Bleeding.  Injury to a nerve near the injection site.  Pain at the injection site.  Weakness or numbness in areas controlled by nerves near the injection site.  Infection.  Temporary fluid retention.  Allergic reactions to medicines or dyes.  Injury to other structures or organs near the injection site. What happens before the procedure?  Follow instructions from your health care provider about eating or drinking restrictions.  Ask your health care provider about:  Changing or stopping your regular medicines. This is especially important if you are taking diabetes medicines or blood thinners.  Taking medicines such as aspirin and ibuprofen. These medicines can thin your blood. Do not take these medicines before your procedure if your health care provider instructs you not to.  Do not take any new dietary supplements or medicines without asking your health care provider first.  Plan to have someone take you home after the procedure. What happens during the procedure?  You Huss need to remove your clothing and dress in an open-back gown.  The procedure will be done while you are lying on an X-ray table. You will most likely be asked to lie on your stomach, but you Stefanko be asked to lie in a different position if an injection will be made in your neck.  Machines will be used to monitor your oxygen levels, heart rate, and blood pressure.  If an injection will be made in your neck, an IV tube will be inserted into one of your veins. Fluids and medicine will flow directly into your body through the IV tube.  The area over the facet joint where the injection will be made will be cleaned with soap. The surrounding skin will be covered with clean drapes.  A numbing medicine (local anesthetic) will be applied to your skin. Your skin Fiallo sting or burn for a moment.  A  video X-ray machine (fluoroscopy) will be used to locate the joint. In some cases, a CT scan Lamphear be used.  A contrast dye Alewine be injected into the facet joint area to help locate the joint.  When the joint is located, an anesthetic will be injected into the joint through the needle.  Your health care provider will ask you whether you feel pain relief. If you do feel relief, a steroid Broaddus be injected to provide pain relief for a longer period of time. If you do not feel relief or feel only partial relief, additional injections of an anesthetic Kock be made in other facet joints.  The needle will be removed.  Your skin will be cleaned.  A bandage (dressing) will be applied over each injection site. The procedure Lusignan vary among health care providers and hospitals. What happens after the procedure?  You will be observed for  15-30 minutes before being allowed to go home. This information is not intended to replace advice given to you by your health care provider. Make sure you discuss any questions you have with your health care provider. Document Released: 09/25/2006 Document Revised: 06/07/2015 Document Reviewed: 01/30/2015 [employer]vier Interactive Patient Education  2017 [employer].

## 2016-09-11 NOTE — Patient Instructions (Addendum)
Preparing for Procedure with Sedation Instructions: . Oral Intake: Do not eat or drink anything for at least 8 hours prior to your procedure. . Transportation: Public transportation is not allowed. Bring an adult driver. The driver must be physically present in our waiting room before any procedure can be started. Marland Kitchen Physical Assistance: Bring an adult physically capable of assisting you, in the event you need help. This adult should keep you company at home for at least 6 hours after the procedure. . Blood Pressure Medicine: Take your blood pressure medicine with a sip of water the morning of the procedure. . Blood thinners:  . Diabetics on insulin: Notify the staff so that you can be scheduled 1st case in the morning. If your diabetes requires high dose insulin, take only  of your normal insulin dose the morning of the procedure and notify the staff that you have done so. . Preventing infections: Shower with an antibacterial soap the morning of your procedure. . Build-up your immune system: Take 1000 mg of Vitamin C with every meal (3 times a day) the day prior to your procedure. Marland Kitchen Antibiotics: Inform the staff if you have a condition or reason that requires you to take antibiotics before dental procedures. . Pregnancy: If you are pregnant, call and cancel the procedure. . Sickness: If you have a cold, fever, or any active infections, call and cancel the procedure. . Arrival: You must be in the facility at least 30 minutes prior to your scheduled procedure. . Children: Do not bring children with you. . Dress appropriately: Bring dark clothing that you would not mind if they get stained. . Valuables: Do not bring any jewelry or valuables. Procedure appointments are reserved for interventional treatments only. Marland Kitchen No Prescription Refills. . No medication changes will be discussed during procedure appointments. . No disability issues will be  discussed. ______________________________________________________________________________________________  GENERAL RISKS AND COMPLICATIONS  What are the risk, side effects and possible complications? Generally speaking, most procedures are safe.  However, with any procedure there are risks, side effects, and the possibility of complications.  The risks and complications are dependent upon the sites that are lesioned, or the type of nerve block to be performed.  The closer the procedure is to the spine, the more serious the risks are.  Great care is taken when placing the radio frequency needles, block needles or lesioning probes, but sometimes complications can occur. 1. Infection: Any time there is an injection through the skin, there is a risk of infection.  This is why sterile conditions are used for these blocks.  There are four possible types of infection. 1. Localized skin infection. 2. Central Nervous System Infection-This can be in the form of Meningitis, which can be deadly. 3. Epidural Infections-This can be in the form of an epidural abscess, which can cause pressure inside of the spine, causing compression of the spinal cord with subsequent paralysis. This would require an emergency surgery to decompress, and there are no guarantees that the patient would recover from the paralysis. 4. Discitis-This is an infection of the intervertebral discs.  It occurs in about 1% of discography procedures.  It is difficult to treat and it Bartl lead to surgery.        2. Pain: the needles have to go through skin and soft tissues, will cause soreness.       3. Damage to internal structures:  The nerves to be lesioned Kleiman be near blood vessels or    other nerves  which can be potentially damaged.       4. Bleeding: Bleeding is more common if the patient is taking blood thinners such as  aspirin, Coumadin, Ticiid, Plavix, etc., or if he/she have some genetic predisposition  such as hemophilia. Bleeding into  the spinal canal can cause compression of the spinal  cord with subsequent paralysis.  This would require an emergency surgery to  decompress and there are no guarantees that the patient would recover from the  paralysis.       5. Pneumothorax:  Puncturing of a lung is a possibility, every time a needle is introduced in  the area of the chest or upper back.  Pneumothorax refers to free air around the  collapsed lung(s), inside of the thoracic cavity (chest cavity).  Another two possible  complications related to a similar event would include: Hemothorax and Chylothorax.   These are variations of the Pneumothorax, where instead of air around the collapsed  lung(s), you Latella have blood or chyle, respectively.       6. Spinal headaches: They Cheaney occur with any procedures in the area of the spine.       7. Persistent CSF (Cerebro-Spinal Fluid) leakage: This is a rare problem, but Koci occur  with prolonged intrathecal or epidural catheters either due to the formation of a fistulous  track or a dural tear.       8. Nerve damage: By working so close to the spinal cord, there is always a possibility of  nerve damage, which could be as serious as a permanent spinal cord injury with  paralysis.       9. Death:  Although rare, severe deadly allergic reactions known as "Anaphylactic  reaction" can occur to any of the medications used.      10. Worsening of the symptoms:  We can always make thing worse.  What are the chances of something like this happening? Chances of any of this occuring are extremely low.  By statistics, you have more of a chance of getting killed in a motor vehicle accident: while driving to the hospital than any of the above occurring .  Nevertheless, you should be aware that they are possibilities.  In general, it is similar to taking a shower.  Everybody knows that you can slip, hit your head and get killed.  Does that mean that you should not shower again?  Nevertheless always keep in mind that  statistics do not mean anything if you happen to be on the wrong side of them.  Even if a procedure has a 1 (one) in a 1,000,000 (million) chance of going wrong, it you happen to be that one..Also, keep in mind that by statistics, you have more of a chance of having something go wrong when taking medications.  Who should not have this procedure? If you are on a blood thinning medication (e.g. Coumadin, Plavix, see list of "Blood Thinners"), or if you have an active infection going on, you should not have the procedure.  If you are taking any blood thinners, please inform your physician.  How should I prepare for this procedure?  Do not eat or drink anything at least six hours prior to the procedure.  Bring a driver with you .  It cannot be a taxi.  Come accompanied by an adult that can drive you back, and that is strong enough to help you if your legs get weak or numb from the local anesthetic.  Take all of  your medicines the morning of the procedure with just enough water to swallow them.  If you have diabetes, make sure that you are scheduled to have your procedure done first thing in the morning, whenever possible.  If you have diabetes, take only half of your insulin dose and notify our nurse that you have done so as soon as you arrive at the clinic.  If you are diabetic, but only take blood sugar pills (oral hypoglycemic), then do not take them on the morning of your procedure.  You Sandoval take them after you have had the procedure.  Do not take aspirin or any aspirin-containing medications, at least eleven (11) days prior to the procedure.  They Heims prolong bleeding.  Wear loose fitting clothing that Lorenz be easy to take off and that you would not mind if it got stained with Betadine or blood.  Do not wear any jewelry or perfume  Remove any nail coloring.  It will interfere with some of our monitoring equipment.  NOTE: Remember that this is not meant to be interpreted as a complete  list of all possible complications.  Unforeseen problems Straub occur.  BLOOD THINNERS The following drugs contain aspirin or other products, which can cause increased bleeding during surgery and should not be taken for 2 weeks prior to and 1 week after surgery.  If you should need take something for relief of minor pain, you Openshaw take acetaminophen which is found in Tylenol,m Datril, Anacin-3 and Panadol. It is not blood thinner. The products listed below are.  Do not take any of the products listed below in addition to any listed on your instruction sheet.  A.P.C or A.P.C with Codeine Codeine Phosphate Capsules #3 Ibuprofen Ridaura  ABC compound Congesprin Imuran rimadil  Advil Cope Indocin Robaxisal  Alka-Seltzer Effervescent Pain Reliever and Antacid Coricidin or Coricidin-D  Indomethacin Rufen  Alka-Seltzer plus Cold Medicine Cosprin Ketoprofen S-A-C Tablets  Anacin Analgesic Tablets or Capsules Coumadin Korlgesic Salflex  Anacin Extra Strength Analgesic tablets or capsules CP-2 Tablets Lanoril Salicylate  Anaprox Cuprimine Capsules Levenox Salocol  Anexsia-D Dalteparin Magan Salsalate  Anodynos Darvon compound Magnesium Salicylate Sine-off  Ansaid Dasin Capsules Magsal Sodium Salicylate  Anturane Depen Capsules Marnal Soma  APF Arthritis pain formula Dewitt's Pills Measurin Stanback  Argesic Dia-Gesic Meclofenamic Sulfinpyrazone  Arthritis Bayer Timed Release Aspirin Diclofenac Meclomen Sulindac  Arthritis pain formula Anacin Dicumarol Medipren Supac  Analgesic (Safety coated) Arthralgen Diffunasal Mefanamic Suprofen  Arthritis Strength Bufferin Dihydrocodeine Mepro Compound Suprol  Arthropan liquid Dopirydamole Methcarbomol with Aspirin Synalgos  ASA tablets/Enseals Disalcid Micrainin Tagament  Ascriptin Doan's Midol Talwin  Ascriptin A/D Dolene Mobidin Tanderil  Ascriptin Extra Strength Dolobid Moblgesic Ticlid  Ascriptin with Codeine Doloprin or Doloprin with Codeine Momentum  Tolectin  Asperbuf Duoprin Mono-gesic Trendar  Aspergum Duradyne Motrin or Motrin IB Triminicin  Aspirin plain, buffered or enteric coated Durasal Myochrisine Trigesic  Aspirin Suppositories Easprin Nalfon Trillsate  Aspirin with Codeine Ecotrin Regular or Extra Strength Naprosyn Uracel  Atromid-S Efficin Naproxen Ursinus  Auranofin Capsules Elmiron Neocylate Vanquish  Axotal Emagrin Norgesic Verin  Azathioprine Empirin or Empirin with Codeine Normiflo Vitamin E  Azolid Emprazil Nuprin Voltaren  Bayer Aspirin plain, buffered or children's or timed BC Tablets or powders Encaprin Orgaran Warfarin Sodium  Buff-a-Comp Enoxaparin Orudis Zorpin  Buff-a-Comp with Codeine Equegesic Os-Cal-Gesic   Buffaprin Excedrin plain, buffered or Extra Strength Oxalid   Bufferin Arthritis Strength Feldene Oxphenbutazone   Bufferin plain or Extra Strength Feldene Capsules Oxycodone with  Aspirin   Bufferin with Codeine Fenoprofen Fenoprofen Pabalate or Pabalate-SF   Buffets II Flogesic Panagesic   Buffinol plain or Extra Strength Florinal or Florinal with Codeine Panwarfarin   Buf-Tabs Flurbiprofen Penicillamine   Butalbital Compound Four-way cold tablets Penicillin   Butazolidin Fragmin Pepto-Bismol   Carbenicillin Geminisyn Percodan   Carna Arthritis Reliever Geopen Persantine   Carprofen Gold's salt Persistin   Chloramphenicol Goody's Phenylbutazone   Chloromycetin Haltrain Piroxlcam   Clmetidine heparin Plaquenil   Cllnoril Hyco-pap Ponstel   Clofibrate Hydroxy chloroquine Propoxyphen         Before stopping any of these medications, be sure to consult the physician who ordered them.  Some, such as Coumadin (Warfarin) are ordered to prevent or treat serious conditions such as "deep thrombosis", "pumonary embolisms", and other heart problems.  The amount of time that you Rowlette need off of the medication Firebaugh also vary with the medication and the reason for which you were taking it.  If you are taking any  of these medications, please make sure you notify your pain physician before you undergo any procedures.         Sacroiliac (SI) Joint Injection Patient Information  Description: The sacroiliac joint connects the scrum (very low back and tailbone) to the ilium (a pelvic bone which also forms half of the hip joint).  Normally this joint experiences very little motion.  When this joint becomes inflamed or unstable low back and or hip and pelvis pain Mcglocklin result.  Injection of this joint with local anesthetics (numbing medicines) and steroids can provide diagnostic information and reduce pain.  This injection is performed with the aid of x-ray guidance into the tailbone area while you are lying on your stomach.   You Zapanta experience an electrical sensation down the leg while this is being done.  You Pang also experience numbness.  We also Slawinski ask if we are reproducing your normal pain during the injection.  Conditions which Winterhalter be treated SI injection:   Low back, buttock, hip or leg pain  Preparation for the Injection:  1. Do not eat any solid food or dairy products within 8 hours of your appointment.  2. You Loescher drink clear liquids up to 3 hours before appointment.  Clear liquids include water, black coffee, juice or soda.  No milk or cream please. 3. You Longshore take your regular medications, including pain medications with a sip of water before your appointment.  Diabetics should hold regular insulin (if take separately) and take 1/2 normal NPH dose the morning of the procedure.  Carry some sugar containing items with you to your appointment. 4. A driver must accompany you and be prepared to drive you home after your procedure. 5. Bring all of your current medications with you. 6. An IV Freiman be inserted and sedation Martorano be given at the discretion of the physician. 7. A blood pressure cuff, EKG and other monitors will often be applied during the procedure.  Some patients Bruun need to have extra  oxygen administered for a short period.  8. You will be asked to provide medical information, including your allergies, prior to the procedure.  We must know immediately if you are taking blood thinners (like Coumadin/Warfarin) or if you are allergic to IV iodine contrast (dye).  We must know if you could possible be pregnant.  Possible side effects:   Bleeding from needle site  Infection (rare, Hazelrigg require surgery)  Nerve injury (rare)  Numbness & tingling (temporary)  A brief convulsion or seizure  Light-headedness (temporary)  Pain at injection site (several days)  Decreased blood pressure (temporary)  Weakness in the leg (temporary)   Call if you experience:   New onset weakness or numbness of an extremity below the injection site that last more than 8 hours.  Hives or difficulty breathing ( go to the emergency room)  Inflammation or drainage at the injection site  Any new symptoms which are concerning to you  Please note:  Although the local anesthetic injected can often make your back/ hip/ buttock/ leg feel good for several hours after the injections, the pain will likely return.  It takes 3-7 days for steroids to work in the sacroiliac area.  You Laughter not notice any pain relief for at least that one week.  If effective, we will often do a series of three injections spaced 3-6 weeks apart to maximally decrease your pain.  After the initial series, we generally will wait some months before a repeat injection of the same type.  If you have any questions, please call [phone] [facility]   Facet Joint Block The facet joints connect the bones of the spine (vertebrae). They make it possible for you to bend, twist, and make other movements with your spine. They also keep you from bending too far, twisting too far, and making other excessive movements. A facet joint block is a procedure where a numbing medicine (anesthetic) is  injected into a facet joint. Often, a type of anti-inflammatory medicine called a steroid is also injected. A facet joint block Gurganus be done to diagnose neck or back pain. If the pain gets better after a facet joint block, it means the pain is probably coming from the facet joint. If the pain does not get better, it means the pain is probably not coming from the facet joint. A facet joint block Nuss also be done to relieve neck or back pain caused by an inflamed facet joint. A facet joint block is only done to relieve pain if the pain does not improve with other methods, such as medicine, exercise programs, and physical therapy. Tell a health care provider about:  Any allergies you have.  All medicines you are taking, including vitamins, herbs, eye drops, creams, and over-the-counter medicines.  Any problems you or family members have had with anesthetic medicines.  Any blood disorders you have.  Any surgeries you have had.  Any medical conditions you have.  Whether you are pregnant or Picklesimer be pregnant. What are the risks? Generally, this is a safe procedure. However, problems Valvo occur, including:  Bleeding.  Injury to a nerve near the injection site.  Pain at the injection site.  Weakness or numbness in areas controlled by nerves near the injection site.  Infection.  Temporary fluid retention.  Allergic reactions to medicines or dyes.  Injury to other structures or organs near the injection site. What happens before the procedure?  Follow instructions from your health care provider about eating or drinking restrictions.  Ask your health care provider about:  Changing or stopping your regular medicines. This is especially important if you are taking diabetes medicines or blood thinners.  Taking medicines such as aspirin and ibuprofen. These medicines can thin your blood. Do not take these medicines before your procedure if your health care provider instructs you not to.  Do  not take any new dietary supplements or medicines without asking your health care provider first.  Plan to have someone take you home after the procedure. What happens during the procedure?  You Boxwell need to remove your clothing and dress in an open-back gown.  The procedure will be done while you are lying on an X-ray table. You will most likely be asked to lie on your stomach, but you Kuehnle be asked to lie in a different position if an injection will be made in your neck.  Machines will be used to monitor your oxygen levels, heart rate, and blood pressure.  If an injection will be made in your neck, an IV tube will be inserted into one of your veins. Fluids and medicine will flow directly into your body through the IV tube.  The area over the facet joint where the injection will be made will be cleaned with soap. The surrounding skin will be covered with clean drapes.  A numbing medicine (local anesthetic) will be applied to your skin. Your skin Keehan sting or burn for a moment.  A video X-ray machine (fluoroscopy) will be used to locate the joint. In some cases, a CT scan Danner be used.  A contrast dye Klosinski be injected into the facet joint area to help locate the joint.  When the joint is located, an anesthetic will be injected into the joint through the needle.  Your health care provider will ask you whether you feel pain relief. If you do feel relief, a steroid Delker be injected to provide pain relief for a longer period of time. If you do not feel relief or feel only partial relief, additional injections of an anesthetic Adelson be made in other facet joints.  The needle will be removed.  Your skin will be cleaned.  A bandage (dressing) will be applied over each injection site. The procedure Cudmore vary among health care providers and hospitals. What happens after the procedure?  You will be observed for 15-30 minutes before being allowed to go home. This information is not intended to replace  advice given to you by your health care provider. Make sure you discuss any questions you have with your health care provider. Document Released: 09/25/2006 Document Revised: 06/07/2015 Document Reviewed: 01/30/2015 Elsevier Interactive Patient Education  2017 [employer].

## 2016-09-23 ENCOUNTER — Ambulatory Visit: Payer: Medicaid Other | Admitting: Pain Medicine

## 2017-02-03 ENCOUNTER — Other Ambulatory Visit: Payer: Self-pay | Admitting: Pain Medicine

## 2017-02-03 DIAGNOSIS — G8929 Other chronic pain: Secondary | ICD-10-CM

## 2017-02-03 DIAGNOSIS — M545 Low back pain: Principal | ICD-10-CM

## 2017-03-08 ENCOUNTER — Other Ambulatory Visit: Payer: Self-pay | Admitting: Pain Medicine

## 2017-03-08 DIAGNOSIS — M159 Polyosteoarthritis, unspecified: Secondary | ICD-10-CM

## 2017-03-08 DIAGNOSIS — M150 Primary generalized (osteo)arthritis: Principal | ICD-10-CM

## 2017-04-22 ENCOUNTER — Other Ambulatory Visit: Payer: Self-pay | Admitting: Pain Medicine

## 2017-04-22 DIAGNOSIS — M792 Neuralgia and neuritis, unspecified: Secondary | ICD-10-CM

## 2017-04-22 DIAGNOSIS — M797 Fibromyalgia: Secondary | ICD-10-CM

## 2017-07-14 ENCOUNTER — Other Ambulatory Visit: Payer: Self-pay | Admitting: Pain Medicine

## 2017-07-14 DIAGNOSIS — G8929 Other chronic pain: Secondary | ICD-10-CM

## 2017-07-14 DIAGNOSIS — M545 Low back pain: Principal | ICD-10-CM

## 2017-08-20 ENCOUNTER — Telehealth: Payer: Self-pay | Admitting: *Deleted

## 2017-08-20 NOTE — Telephone Encounter (Signed)
I understand that she is PRN procedures but she has not been seen since 08/2016. She has to at least be seen q6 months for refills on medications. She will need an apt. thanks

## 2017-08-26 ENCOUNTER — Encounter: Payer: Self-pay | Admitting: Nurse Practitioner

## 2017-08-26 ENCOUNTER — Other Ambulatory Visit: Payer: Self-pay

## 2017-08-26 ENCOUNTER — Ambulatory Visit: Payer: Medicaid Other | Attending: Nurse Practitioner | Admitting: Nurse Practitioner

## 2017-08-26 ENCOUNTER — Telehealth: Payer: Self-pay | Admitting: Nurse Practitioner

## 2017-08-26 ENCOUNTER — Ambulatory Visit: Payer: Self-pay | Admitting: Dietician

## 2017-08-26 VITALS — BP 106/67 | HR 71 | Temp 97.9°F | Resp 16 | Ht 66.0 in | Wt 262.0 lb

## 2017-08-26 DIAGNOSIS — Z5181 Encounter for therapeutic drug level monitoring: Secondary | ICD-10-CM | POA: Diagnosis present

## 2017-08-26 DIAGNOSIS — M1991 Primary osteoarthritis, unspecified site: Secondary | ICD-10-CM | POA: Diagnosis not present

## 2017-08-26 DIAGNOSIS — M792 Neuralgia and neuritis, unspecified: Secondary | ICD-10-CM

## 2017-08-26 DIAGNOSIS — M797 Fibromyalgia: Secondary | ICD-10-CM | POA: Diagnosis not present

## 2017-08-26 DIAGNOSIS — R55 Syncope and collapse: Secondary | ICD-10-CM | POA: Diagnosis not present

## 2017-08-26 DIAGNOSIS — M150 Primary generalized (osteo)arthritis: Secondary | ICD-10-CM | POA: Diagnosis not present

## 2017-08-26 DIAGNOSIS — M961 Postlaminectomy syndrome, not elsewhere classified: Secondary | ICD-10-CM | POA: Insufficient documentation

## 2017-08-26 DIAGNOSIS — J45909 Unspecified asthma, uncomplicated: Secondary | ICD-10-CM | POA: Insufficient documentation

## 2017-08-26 DIAGNOSIS — F17210 Nicotine dependence, cigarettes, uncomplicated: Secondary | ICD-10-CM | POA: Insufficient documentation

## 2017-08-26 DIAGNOSIS — M545 Low back pain, unspecified: Secondary | ICD-10-CM

## 2017-08-26 DIAGNOSIS — M5417 Radiculopathy, lumbosacral region: Secondary | ICD-10-CM | POA: Diagnosis not present

## 2017-08-26 DIAGNOSIS — G8929 Other chronic pain: Secondary | ICD-10-CM | POA: Diagnosis not present

## 2017-08-26 DIAGNOSIS — G894 Chronic pain syndrome: Secondary | ICD-10-CM | POA: Diagnosis not present

## 2017-08-26 DIAGNOSIS — M48061 Spinal stenosis, lumbar region without neurogenic claudication: Secondary | ICD-10-CM | POA: Diagnosis not present

## 2017-08-26 DIAGNOSIS — Z7901 Long term (current) use of anticoagulants: Secondary | ICD-10-CM | POA: Diagnosis not present

## 2017-08-26 DIAGNOSIS — G2581 Restless legs syndrome: Secondary | ICD-10-CM | POA: Insufficient documentation

## 2017-08-26 DIAGNOSIS — Z96651 Presence of right artificial knee joint: Secondary | ICD-10-CM | POA: Insufficient documentation

## 2017-08-26 DIAGNOSIS — M5136 Other intervertebral disc degeneration, lumbar region: Secondary | ICD-10-CM | POA: Insufficient documentation

## 2017-08-26 DIAGNOSIS — I10 Essential (primary) hypertension: Secondary | ICD-10-CM | POA: Diagnosis not present

## 2017-08-26 DIAGNOSIS — F319 Bipolar disorder, unspecified: Secondary | ICD-10-CM | POA: Diagnosis not present

## 2017-08-26 DIAGNOSIS — E7800 Pure hypercholesterolemia, unspecified: Secondary | ICD-10-CM | POA: Diagnosis not present

## 2017-08-26 DIAGNOSIS — M159 Polyosteoarthritis, unspecified: Secondary | ICD-10-CM

## 2017-08-26 DIAGNOSIS — F419 Anxiety disorder, unspecified: Secondary | ICD-10-CM | POA: Diagnosis not present

## 2017-08-26 MED ORDER — METHOCARBAMOL 750 MG PO TABS: 750.0000 mg | ORAL_TABLET | Freq: Three times a day (TID) | ORAL | 0 refills | Status: DC | PRN

## 2017-08-26 MED ORDER — DICLOFENAC SODIUM 75 MG PO TBEC: 75.0000 mg | DELAYED_RELEASE_TABLET | Freq: Two times a day (BID) | ORAL | 2 refills | Status: DC

## 2017-08-26 MED ORDER — DICLOFENAC SODIUM 1 % TD GEL: 4.0000 g | Freq: Four times a day (QID) | TRANSDERMAL | 2 refills | Status: AC

## 2017-08-26 MED ORDER — GABAPENTIN 800 MG PO TABS: 800.0000 mg | ORAL_TABLET | Freq: Four times a day (QID) | ORAL | 2 refills | Status: DC

## 2017-08-26 NOTE — Progress Notes (Signed)
Patient's Name: Shelly Wright  MRN: [id number]  Referring Provider: [facility]  DOB: 1971/01/19  PCP: [facility]  DOS: 08/26/2017  Note by: Vevelyn Francois NP  Service setting: Ambulatory outpatient  Specialty: Interventional Pain Management  Location: [facility] (AMB) Pain Management Facility    Patient type: Established    Primary Reason(s) for Visit: Encounter for prescription drug management. (Level of risk: moderate)  CC: Back Pain (lower) and Neck Pain (radiates to shoulders bilaterally)  HPI  Shelly Wright is a 47 y.o. year old, female patient, who comes today for a medication management evaluation. She has Anxiety and depression; Airway hyperreactivity; Bipolar 1 disorder ([place]); Cardiac conduction disorder; Chronic pain syndrome; DDD (degenerative disc disease), lumbar; Fibromyalgia; Personal history of perinatal problems; BP (high blood pressure); Irregular cardiac rhythm; Chronic low back pain (Location of Secondary source of pain) (Bilateral) (L>R); Lumbar canal stenosis (L3-4 and L4-5); Depression, major, single episode; Extreme obesity; Cervical post-laminectomy syndrome (Anterior-posterior approach) (2014); Osteoarthritis of knee (Left); Hypercholesterolemia without hypertriglyceridemia; Breathlessness on exertion; Thoracic or lumbosacral neuritis or radiculitis; Avitaminosis D; Long term current use of anticoagulant therapy (Lovenox); Long term current use of opiate analgesic; Long term prescription opiate use; Opiate use; Opioid dependence, uncomplicated ([place]); Radiculopathy of cervical region (B)(R>L)(Thumb + Index fingers); Chronic shoulder pain (Location of Tertiary source of pain) (Bilateral) (L>R); S/P TKR Arthroplasty (Right); Chronic hip pain (Location of Primary Source of Pain) (Bilateral) (L>R); Chronic neck pain (Bilateral) (L>R); Chronic knee pain (Bilateral) (L>R); Marijuana use; 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 Her primarily concern today is the Back Pain (lower) and Neck Pain (radiates to shoulders bilaterally)  Pain Assessment: Location: Lower, Left, Right Back Radiating: hips/buttock down side of leg left leg to knee Onset: More than a month ago Duration: Chronic pain Quality: Aching, Constant, Stabbing, Sore, Discomfort Severity: 5 /10 (self-reported pain score)  Note: Reported level is compatible with observation.                          Effect on ADL: prolonged standing, sitting Timing:   Modifying factors: rest  Shelly Wright was last scheduled for an appointment on Visit date not found for medication management. During today's appointment we reviewed Shelly Wright's chronic pain status, as well as her outpatient medication regimen. She states that she has numbness in her right thigh. She is not sure if this is a result of her knee replacement. She does have some weakness in her legs. She states that she did have right knee replacement one year ago. She states that it is doing well.She is in need of a left knee replacement however she has to lose 15 more pounds. She does not feel like the tizanidine is not effective. She admits that she has used Manufacturing engineer in the past. She has also used Flexeril; this was not effective. She denies the use of Methocarbamol.     The patient  reports that she does not use drugs. Her body mass index is 42.29 kg/m.  Further details on both, my assessment(s), as well as the proposed treatment plan, please see below.   Laboratory Chemistry  Inflammation Markers (CRP: Acute Phase) (ESR: Chronic Phase) Lab Results  Component Value  Date   CRP <0.80 05/06/2016   ESRSEDRATE 2  05/06/2016                         Rheumatology Markers No results found for: Elayne Guerin, [facility]                      Renal Function Markers Lab Results  Component Value Date   BUN 17 05/06/2016   CREATININE 0.96 05/06/2016   GFRAA >60 05/06/2016   GFRNONAA >60 05/06/2016                              Hepatic Function Markers Lab Results  Component Value Date   AST 23 05/06/2016   ALT 27 05/06/2016   ALBUMIN 4.5 05/06/2016   ALKPHOS 86 05/06/2016                        Electrolytes Lab Results  Component Value Date   NA 136 05/06/2016   K 3.9 05/06/2016   CL 100 (L) 05/06/2016   CALCIUM 9.3 05/06/2016   MG 2.1 05/06/2016                        Neuropathy Markers Lab Results  Component Value Date   VITAMINB12 319 05/06/2016                        Bone Pathology Markers Lab Results  Component Value Date   25OHVITD1 56 05/06/2016   25OHVITD2 9.2 05/06/2016   25OHVITD3 47 05/06/2016                         Coagulation Parameters Lab Results  Component Value Date   INR 0.93 05/06/2016   LABPROT 12.5 05/06/2016   APTT 29 05/06/2016   PLT 287 05/06/2016                        Cardiovascular Markers Lab Results  Component Value Date   HGB 15.4 05/06/2016   HCT 45.5 05/06/2016                         CA Markers No results found for: CEA, CA125, LABCA2                      Note: Lab results reviewed.  Recent Diagnostic Imaging Results  DG C-Arm 1-60 Min-No Report Fluoroscopy was utilized by the requesting physician.  No radiographic  interpretation.   Complexity Note: Imaging results reviewed. Results shared with Shelly Wright, using Layman's terms.                         Meds   Current Outpatient Medications:  .  albuterol (VENTOLIN HFA) 108 (90 Base) MCG/ACT inhaler, TAKE 2 PUFFS INTO LUNGS EVERY 6 HOURS ASNEEDED FOR WHEEZING, Disp: , Rfl:  .  ARIPiprazole (ABILIFY) 10 MG tablet, Take 10 mg by mouth daily., Disp: ,  Rfl:  .  hydrochlorothiazide (HYDRODIURIL) 25 MG tablet, Take 25 mg by mouth daily. , Disp: , Rfl: 1 .  hydrOXYzine (ATARAX/VISTARIL) 50 MG tablet, Take 50 mg by mouth 3 (three) times daily., Disp: , Rfl:  .  simvastatin (ZOCOR)  20 MG tablet, Take 20 mg by mouth daily at 6 PM., Disp: , Rfl:  .  traZODone (DESYREL) 150 MG tablet, Take 150 mg by mouth at bedtime as needed. , Disp: , Rfl:  .  venlafaxine XR (EFFEXOR-XR) 75 MG 24 hr capsule, TAKE 3 CAPSULES BY MOUTH EVERY MORNING, Disp: , Rfl:  .  diclofenac (VOLTAREN) 75 MG EC tablet, Take 1 tablet (75 mg total) by mouth 2 (two) times daily., Disp: 60 tablet, Rfl: 2 .  diclofenac sodium (VOLTAREN) 1 % GEL, Apply 4 g topically 4 (four) times daily., Disp: 200 g, Rfl: 2 .  gabapentin (NEURONTIN) 800 MG tablet, Take 1 tablet (800 mg total) by mouth every 6 (six) hours., Disp: 120 tablet, Rfl: 2 .  methocarbamol (ROBAXIN) 750 MG tablet, Take 1 tablet (750 mg total) by mouth every 8 (eight) hours as needed for muscle spasms., Disp: 90 tablet, Rfl: 0 .  tiZANidine (ZANAFLEX) 4 MG tablet, Take 1 tablet (4 mg total) by mouth every 6 (six) hours as needed for muscle spasms., Disp: 120 tablet, Rfl: 2  ROS  Constitutional: Denies any fever or chills Gastrointestinal: No reported hemesis, hematochezia, vomiting, or acute GI distress Musculoskeletal: Denies any acute onset joint swelling, redness, loss of ROM, or weakness Neurological: No reported episodes of acute onset apraxia, aphasia, dysarthria, agnosia, amnesia, paralysis, loss of coordination, or loss of consciousness  Allergies  Ms. Stencel has No Known Allergies.  PFSH  Drug: Ms. Priore  reports that she does not use drugs. Alcohol:  reports that she does not drink alcohol. Tobacco:  reports that she has been smoking cigarettes.  She has been smoking about 1.00 pack per day. She has never used smokeless tobacco. 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 Surgical: Ms. Hilmer  has a past surgical history that includes Tonsillectomy; Knee arthroscopy (Bilateral); Cervical Fusion X 2; Abdominal hysterectomy; Tubal ligation; Total knee arthroplasty (Right, 02/01/2015); and Joint replacement (Right, 01/2016). Family: family history includes Breast cancer in her mother; Colon cancer in her father; Heart attack in her father.  Constitutional Exam  General appearance: Well nourished, well developed, and well hydrated. In no apparent acute distress Vitals:   08/26/17 0845  BP: 106/67  Pulse: 71  Resp: 16  Temp: 97.9 F (36.6 C)  SpO2: 99%  Weight: 262 lb (118.8 kg)  Height: _0  (1.676 m)   BMI Assessment: Estimated body mass index is 42.29 kg/m as calculated from the following:   Height as of this encounter: _1  (1.676 m).   Weight as of this encounter: 262 lb (118.8 kg).  BMI interpretation table: BMI level Category Range association with higher incidence of chronic pain  <18 kg/m2 Underweight   18.5-24.9 kg/m2 Ideal body weight   25-29.9 kg/m2 Overweight Increased incidence by 20%  30-34.9 kg/m2 Obese (Class I) Increased incidence by 68%  35-39.9 kg/m2 Severe obesity (Class II) Increased incidence by 136%  >40 kg/m2 Extreme obesity (Class III) Increased incidence by 254%  BMI Readings from Last 4  Encounters:  08/26/17 42.29 kg/m  09/11/16 44.06 kg/m  08/14/16 46.32 kg/m  07/09/16 45.68 kg/m   Wt Readings from Last 4 Encounters:  08/26/17 262 lb (118.8 kg)  09/11/16 273 lb (123.8 kg)  08/14/16 287 lb (130.2 kg)  07/09/16 283 lb (128.4 kg)  Psych/Mental status: Alert, oriented x 3 (person, place, & time)       Eyes: PERLA Respiratory: No evidence of acute respiratory distress   Lumbar Spine Area Exam  Skin & Axial Inspection: No masses, redness, or swelling Alignment: Symmetrical Functional ROM: Unrestricted ROM      Stability: No instability detected Muscle Tone/Strength: Increased muscle tone over affected area Sensory (Neurological): Unimpaired Palpation: Complains of area being tender to palpation       Provocative Tests: Lumbar Hyperextension and rotation test: evaluation deferred today       Lumbar Lateral bending test: evaluation deferred today       Patrick's Maneuver: evaluation deferred today                    Gait & Posture Assessment  Ambulation: Patient ambulates using a cane Gait: Relatively normal for age and body habitus Posture: WNL   Lower Extremity Exam    Side: Right lower extremity  Side: Left lower extremity  Skin & Extremity Inspection: Evidence of prior arthroplastic surgery  Skin & Extremity Inspection: Skin color, temperature, and hair growth are WNL. No peripheral edema or cyanosis. No masses, redness, swelling, asymmetry, or associated skin lesions. No contractures.  Functional ROM: Adequate ROM          Functional ROM: Adequate ROM          Muscle Tone/Strength: Functionally intact. No obvious neuro-muscular anomalies detected.  Muscle Tone/Strength: Functionally intact. No obvious neuro-muscular anomalies detected.  Sensory (Neurological): Unimpaired  Sensory (Neurological): Unimpaired  Palpation: No palpable anomalies  Palpation: No palpable anomalies   Assessment  Primary Diagnosis & Pertinent Problem List: The primary encounter  diagnosis was Chronic low back pain (Location of Secondary source of pain) (Bilateral) (L>R). Diagnoses of Primary osteoarthritis involving multiple joints, Fibromyalgia, and Neurogenic pain were also pertinent to this visit.  Status Diagnosis  Controlled Controlled Controlled 1. Chronic low back pain (Location of Secondary source of pain) (Bilateral) (L>R)   2. Primary osteoarthritis involving multiple joints   3. Fibromyalgia   4. Neurogenic pain     Problems updated and reviewed during this visit: Problem  Hypercholesteremia  Depression  Thoracic Or Lumbosacral Neuritis Or Radiculitis   Plan of Care  Pharmacotherapy (Medications Ordered): Meds ordered this encounter  Medications  . gabapentin (NEURONTIN) 800 MG tablet    Sig: Take 1 tablet (800 mg total) by mouth every 6 (six) hours.    Dispense:  120 tablet    Refill:  2    Do not place this medication, or any other prescription from our practice, on "Automatic Refill". Patient [place] have prescription filled one day early if pharmacy is closed on scheduled refill date.    Order Specific Question:   Supervising Provider    Answer:   Milinda Pointer [phone]  . diclofenac (VOLTAREN) 75 MG EC tablet    Sig: Take 1 tablet (75 mg total) by mouth 2 (two) times daily.    Dispense:  60 tablet    Refill:  2    Do not place medication on "Automatic Refill". Fill one day early if pharmacy is closed on scheduled refill date.  Order Specific Question:   Supervising Provider    Answer:   Milinda Pointer [phone]  . methocarbamol (ROBAXIN) 750 MG tablet    Sig: Take 1 tablet (750 mg total) by mouth every 8 (eight) hours as needed for muscle spasms.    Dispense:  90 tablet    Refill:  0    Do not place this medication, or any other prescription from our practice, on "Automatic Refill". Patient [place] have prescription filled one day early if pharmacy is closed on scheduled refill date.    Order Specific Question:   Supervising Provider     Answer:   Milinda Pointer [phone]  . diclofenac sodium (VOLTAREN) 1 % GEL    Sig: Apply 4 g topically 4 (four) times daily.    Dispense:  200 g    Refill:  2    Do not add this medication to the electronic "Automatic Refill" notification system. Patient [place] have prescription filled one day early if pharmacy is closed on scheduled refill date.    Order Specific Question:   Supervising Provider    Answer:   Milinda Pointer [phone]   New Prescriptions   DICLOFENAC SODIUM (VOLTAREN) 1 % GEL    Apply 4 g topically 4 (four) times daily.   METHOCARBAMOL (ROBAXIN) 750 MG TABLET    Take 1 tablet (750 mg total) by mouth every 8 (eight) hours as needed for muscle spasms.   Medications administered today: Bethann Punches. [place] had no medications administered during this visit. Lab-work, procedure(s), and/or referral(s): No orders of the defined types were placed in this encounter.  Imaging and/or referral(s): None  Interventional therapies: Planned, scheduled, and/or pending:   Diagnostic bilateral lumbar facet block #2+ Diagnostic bilateral intra-articular S-Ijoint #2 injection   Considering:   Diagnostic bilateral lumbar facet block Possible bilateral lumbar facet RFA Diagnostic bilateral intra-articular S-Ijoint injection Possible bilateral S-Ijoint RFA Diagnostic bilateral intra-articular shoulder joint injection Diagnostic bilateral suprascapular nerve block Possible bilateral suprascapular RFA Diagnostic left intra-articular knee joint injection Possible series of 5 Hyalgan intra-articular left knee joint injections Diagnostic bilateral genicular nerve blocks Possible bilateral genicular nerve RFA Diagnostic bilateral cervical facet blocks Possible bilateral cervical facet RFA Diagnostic left cervical epidural steroid injection   Palliative PRN treatment(s):   Diagnostic bilateral lumbar facet block+ Diagnostic bilateral intra-articular S-Ijoint injection     Provider-requested follow-up: Return in about 6 months (around 02/25/2018) for MedMgmt with Me Donella Stade [place] Pace).  Future Appointments  Date Time Provider [place]  09/03/2017  1:45 PM Marja Kays, RD [facility] None  02/25/2018  8:30 AM Vevelyn Francois, NP [facility] None   Primary Care Physician: [facility] Location: [facility] Note by: Vevelyn Francois NP Date: 08/26/2017; Time: 10:24 AM  Pain Score Disclaimer: We use the NRS-11 scale. This is a self-reported, subjective measurement of pain severity with only modest accuracy. It is used primarily to identify changes within a particular patient. It must be understood that outpatient pain scales are significantly less accurate that those used for research, where they can be applied under ideal controlled circumstances with minimal exposure to variables. In reality, the score is likely to be a combination of pain intensity and pain affect, where pain affect describes the degree of emotional arousal or changes in action readiness caused by the sensory experience of pain. Factors such as social and work situation, setting, emotional state, anxiety levels, expectation, and prior pain experience [place] influence pain perception and show large inter-individual  differences that [place] also be affected by time variables.  Patient instructions provided during this appointment: Patient Instructions  ____________________________________________________________________________________________  Medication Rules  Applies to: All patients receiving prescriptions (written or electronic).  Pharmacy of record: Pharmacy where electronic prescriptions will be sent. If written prescriptions are taken to a different pharmacy, please inform the nursing staff. The pharmacy listed in the electronic medical record should be the one where you would like electronic prescriptions to be sent.  Prescription refills: Only  during scheduled appointments. Applies to both, written and electronic prescriptions.  NOTE: The following applies primarily to controlled substances (Opioid* Pain Medications).   Patient's responsibilities: 1. Pain Pills: Bring all pain pills to every appointment (except for procedure appointments). 2. Pill Bottles: Bring pills in original pharmacy bottle. Always bring newest bottle. Bring bottle, even if empty. 3. Medication refills: You are responsible for knowing and keeping track of what medications you need refilled. The day before your appointment, write a list of all prescriptions that need to be refilled. Bring that list to your appointment and give it to the admitting nurse. Prescriptions will be written only during appointments. If you forget a medication, it will not be "Called in", "Faxed", or "electronically sent". You will need to get another appointment to get these prescribed. 4. Prescription Accuracy: You are responsible for carefully inspecting your prescriptions before leaving our office. Have the discharge nurse carefully go over each prescription with you, before taking them home. Make sure that your name is accurately spelled, that your address is correct. Check the name and dose of your medication to make sure it is accurate. Check the number of pills, and the written instructions to make sure they are clear and accurate. Make sure that you are given enough medication to last until your next medication refill appointment. 5. Taking Medication: Take medication as prescribed. Never take more pills than instructed. Never take medication more frequently than prescribed. Taking less pills or less frequently is permitted and encouraged, when it comes to controlled substances (written prescriptions).  6. Inform other Doctors: Always inform, all of your healthcare providers, of all the medications you take. 7. Pain Medication from other Providers: You are not allowed to accept any  additional pain medication from any other Doctor or Healthcare provider. There are two exceptions to this rule. (see below) In the event that you require additional pain medication, you are responsible for notifying us, as stated below. 8. Medication Agreement: You are responsible for carefully reading and following our Medication Agreement. This must be signed before receiving any prescriptions from our practice. Safely store a copy of your signed Agreement. Violations to the Agreement will result in no further prescriptions. (Additional copies of our Medication Agreement are available upon request.) 9. Laws, Rules, & Regulations: All patients are expected to follow all Federal and [employer], Rules, [employer]. Ignorance of the Laws does not constitute a valid excuse. The use of any illegal substances is prohibited. 10. Adopted CDC guidelines & recommendations: Target dosing levels will be at or below 60 MME/day. Use of benzodiazepines** is not recommended.  Exceptions: There are only two exceptions to the rule of not receiving pain medications from other Healthcare Providers. 1. Exception #1 (Emergencies): In the event of an emergency (i.e.: accident requiring emergency care), you are allowed to receive additional pain medication. However, you are responsible for: As soon as you are able, call our office [phone], at any time of the day or night, and leave a  message stating your name, the date and nature of the emergency, and the name and dose of the medication prescribed. In the event that your call is answered by a member of our staff, make sure to document and save the date, time, and the name of the person that took your information.  2. Exception #2 (Planned Surgery): In the event that you are scheduled by another doctor or dentist to have any type of surgery or procedure, you are allowed (for a period no longer than 30 days), to receive additional pain medication, for the acute  post-op pain. However, in this case, you are responsible for picking up a copy of our "Post-op Pain Management for Surgeons" handout, and giving it to your surgeon or dentist. This document is available at our office, and does not require an appointment to obtain it. Simply go to our office during business hours (Monday-Thursday from 8:00 AM to 4:00 PM) (Friday 8:00 AM to 12:00 Noon) or if you have a scheduled appointment with [place], prior to your surgery, and ask for it by name. In addition, you will need to provide [place] with your name, name of your surgeon, type of surgery, and date of procedure or surgery.  *Opioid medications include: morphine, codeine, oxycodone, oxymorphone, hydrocodone, hydromorphone, meperidine, tramadol, tapentadol, buprenorphine, fentanyl, methadone. **Benzodiazepine medications include: diazepam (Valium), alprazolam (Xanax), clonazepam (Klonopine), lorazepam (Ativan), clorazepate (Tranxene), chlordiazepoxide (Librium), estazolam (Prosom), oxazepam (Serax), temazepam (Restoril), triazolam (Halcion) (Last updated: 07/17/2017) ____________________________________________________________________________________________

## 2017-08-26 NOTE — Telephone Encounter (Signed)
Patient needs prior auth for Diclofenac.

## 2017-08-26 NOTE — Progress Notes (Signed)
Nursing Pain Medication Assessment:  Safety precautions to be maintained throughout the outpatient stay will include: orient to surroundings, keep bed in low position, maintain call bell within reach at all times, provide assistance with transfer out of bed and ambulation.  Medication Inspection Compliance: Ms. Holdman did not comply with our request to bring her pills to be counted. She was reminded that bringing the medication bottles, even when empty, is a requirement.  Medication: None brought in. Pill/Patch Count: None available to be counted. Bottle Appearance: No container available. Did not bring bottle(s) to appointment. Filled Date: N/A Last Medication intake:  Today

## 2017-08-26 NOTE — Patient Instructions (Signed)
____________________________________________________________________________________________  Medication Rules  Applies to: All patients receiving prescriptions (written or electronic).  Pharmacy of record: Pharmacy where electronic prescriptions will be sent. If written prescriptions are taken to a different pharmacy, please inform the nursing staff. The pharmacy listed in the electronic medical record should be the one where you would like electronic prescriptions to be sent.  Prescription refills: Only during scheduled appointments. Applies to both, written and electronic prescriptions.  NOTE: The following applies primarily to controlled substances (Opioid* Pain Medications).   Patient's responsibilities: 1. Pain Pills: Bring all pain pills to every appointment (except for procedure appointments). 2. Pill Bottles: Bring pills in original pharmacy bottle. Always bring newest bottle. Bring bottle, even if empty. 3. Medication refills: You are responsible for knowing and keeping track of what medications you need refilled. The day before your appointment, write a list of all prescriptions that need to be refilled. Bring that list to your appointment and give it to the admitting nurse. Prescriptions will be written only during appointments. If you forget a medication, it will not be "Called in", "Faxed", or "electronically sent". You will need to get another appointment to get these prescribed. 4. Prescription Accuracy: You are responsible for carefully inspecting your prescriptions before leaving our office. Have the discharge nurse carefully go over each prescription with you, before taking them home. Make sure that your name is accurately spelled, that your address is correct. Check the name and dose of your medication to make sure it is accurate. Check the number of pills, and the written instructions to make sure they are clear and accurate. Make sure that you are given enough medication to last  until your next medication refill appointment. 5. Taking Medication: Take medication as prescribed. Never take more pills than instructed. Never take medication more frequently than prescribed. Taking less pills or less frequently is permitted and encouraged, when it comes to controlled substances (written prescriptions).  6. Inform other Doctors: Always inform, all of your healthcare providers, of all the medications you take. 7. Pain Medication from other Providers: You are not allowed to accept any additional pain medication from any other Doctor or Healthcare provider. There are two exceptions to this rule. (see below) In the event that you require additional pain medication, you are responsible for notifying us, as stated below. 8. Medication Agreement: You are responsible for carefully reading and following our Medication Agreement. This must be signed before receiving any prescriptions from our practice. Safely store a copy of your signed Agreement. Violations to the Agreement will result in no further prescriptions. (Additional copies of our Medication Agreement are available upon request.) 9. Laws, Rules, & Regulations: All patients are expected to follow all Federal and State Laws, Statutes, Rules, & Regulations. Ignorance of the Laws does not constitute a valid excuse. The use of any illegal substances is prohibited. 10. Adopted CDC guidelines & recommendations: Target dosing levels will be at or below 60 MME/day. Use of benzodiazepines** is not recommended.  Exceptions: There are only two exceptions to the rule of not receiving pain medications from other Healthcare Providers. 1. Exception #1 (Emergencies): In the event of an emergency (i.e.: accident requiring emergency care), you are allowed to receive additional pain medication. However, you are responsible for: As soon as you are able, call our office [phone], at any time of the day or night, and leave a message stating your name, the  date and nature of the emergency, and the name and dose of the medication   prescribed. In the event that your call is answered by a member of our staff, make sure to document and save the date, time, and the name of the person that took your information.  2. Exception #2 (Planned Surgery): In the event that you are scheduled by another doctor or dentist to have any type of surgery or procedure, you are allowed (for a period no longer than 30 days), to receive additional pain medication, for the acute post-op pain. However, in this case, you are responsible for picking up a copy of our "Post-op Pain Management for Surgeons" handout, and giving it to your surgeon or dentist. This document is available at our office, and does not require an appointment to obtain it. Simply go to our office during business hours (Monday-Thursday from 8:00 AM to 4:00 PM) (Friday 8:00 AM to 12:00 Noon) or if you have a scheduled appointment with us, prior to your surgery, and ask for it by name. In addition, you will need to provide us with your name, name of your surgeon, type of surgery, and date of procedure or surgery.  *Opioid medications include: morphine, codeine, oxycodone, oxymorphone, hydrocodone, hydromorphone, meperidine, tramadol, tapentadol, buprenorphine, fentanyl, methadone. **Benzodiazepine medications include: diazepam (Valium), alprazolam (Xanax), clonazepam (Klonopine), lorazepam (Ativan), clorazepate (Tranxene), chlordiazepoxide (Librium), estazolam (Prosom), oxazepam (Serax), temazepam (Restoril), triazolam (Halcion) (Last updated: 07/17/2017) ____________________________________________________________________________________________    

## 2017-08-26 NOTE — Telephone Encounter (Signed)
PA request sent.

## 2017-08-28 ENCOUNTER — Encounter: Payer: Medicaid Other | Admitting: Nurse Practitioner

## 2017-08-29 ENCOUNTER — Telehealth: Payer: Self-pay | Admitting: *Deleted

## 2017-08-29 NOTE — Telephone Encounter (Signed)
Notified patient that the PA was sent in on 08-26-17 and she should have her pharmacy rerun the script tomorrow to see if it has been approved.

## 2017-09-03 ENCOUNTER — Ambulatory Visit: Payer: Self-pay | Admitting: Dietician

## 2017-09-03 ENCOUNTER — Telehealth: Payer: Self-pay | Admitting: Nurse Practitioner

## 2017-09-03 NOTE — Telephone Encounter (Signed)
Patient notified that insurance did not approve Voltaren.

## 2017-09-03 NOTE — Telephone Encounter (Signed)
Patient calling to check on medication prior auth. Please let her know if she can get her meds

## 2017-09-25 ENCOUNTER — Other Ambulatory Visit: Payer: Self-pay | Admitting: Nurse Practitioner

## 2017-09-29 ENCOUNTER — Telehealth: Payer: Self-pay | Admitting: Nurse Practitioner

## 2017-09-29 NOTE — Telephone Encounter (Signed)
Patient states her refills for robaxin did not get called to pharmacy. Next MM appt is October. Please call patient

## 2017-09-29 NOTE — Telephone Encounter (Signed)
Message sent to [facility]

## 2017-09-30 ENCOUNTER — Other Ambulatory Visit: Payer: Self-pay | Admitting: Nurse Practitioner

## 2017-09-30 MED ORDER — METHOCARBAMOL 750 MG PO TABS: 750.0000 mg | ORAL_TABLET | Freq: Three times a day (TID) | ORAL | 0 refills | Status: DC | PRN

## 2017-09-30 NOTE — Telephone Encounter (Signed)
sent 

## 2017-10-06 ENCOUNTER — Encounter: Payer: Self-pay | Admitting: Dietician

## 2017-10-06 NOTE — Progress Notes (Signed)
Have not heard back from patient to reschedule her second missed appointment. Sent letter to referring provider. 

## 2017-10-14 ENCOUNTER — Other Ambulatory Visit: Payer: Self-pay | Admitting: Nurse Practitioner

## 2017-10-18 ENCOUNTER — Other Ambulatory Visit: Payer: Self-pay | Admitting: Nurse Practitioner

## 2017-10-18 DIAGNOSIS — M797 Fibromyalgia: Secondary | ICD-10-CM

## 2017-10-18 DIAGNOSIS — M792 Neuralgia and neuritis, unspecified: Secondary | ICD-10-CM

## 2017-10-29 ENCOUNTER — Emergency Department
Admission: EM | Admit: 2017-10-29 | Discharge: 2017-10-29 | Disposition: A | Discharge status: Home / Self Care | Payer: Medicaid Other | Attending: Emergency Medicine | Admitting: Emergency Medicine

## 2017-10-29 ENCOUNTER — Encounter: Payer: Self-pay | Admitting: Emergency Medicine

## 2017-10-29 DIAGNOSIS — I10 Essential (primary) hypertension: Secondary | ICD-10-CM | POA: Diagnosis not present

## 2017-10-29 DIAGNOSIS — J45909 Unspecified asthma, uncomplicated: Secondary | ICD-10-CM | POA: Diagnosis not present

## 2017-10-29 DIAGNOSIS — Z96651 Presence of right artificial knee joint: Secondary | ICD-10-CM | POA: Insufficient documentation

## 2017-10-29 DIAGNOSIS — Z79899 Other long term (current) drug therapy: Secondary | ICD-10-CM | POA: Diagnosis not present

## 2017-10-29 DIAGNOSIS — M25562 Pain in left knee: Secondary | ICD-10-CM | POA: Diagnosis present

## 2017-10-29 DIAGNOSIS — L02412 Cutaneous abscess of left axilla: Secondary | ICD-10-CM | POA: Insufficient documentation

## 2017-10-29 DIAGNOSIS — F17210 Nicotine dependence, cigarettes, uncomplicated: Secondary | ICD-10-CM | POA: Insufficient documentation

## 2017-10-29 MED ORDER — SULFAMETHOXAZOLE-TRIMETHOPRIM 800-160 MG PO TABS: 1.0000 | ORAL_TABLET | Freq: Two times a day (BID) | ORAL | 0 refills | Status: DC

## 2017-10-29 MED ORDER — HYDROMORPHONE HCL 1 MG/ML IJ SOLN
1.0000 mg | Freq: Once | INTRAMUSCULAR | Status: AC
  Administered 2017-10-29: 1 mg via INTRAMUSCULAR
  Filled 2017-10-29: qty 1

## 2017-10-29 MED ORDER — LIDOCAINE 5 % EX PTCH
1.0000 | MEDICATED_PATCH | CUTANEOUS | Status: DC
  Filled 2017-10-29: qty 1

## 2017-10-29 MED ORDER — LIDOCAINE 5 % EX PTCH: 1.0000 | MEDICATED_PATCH | CUTANEOUS | 2 refills | Status: DC

## 2017-10-29 MED ORDER — SULFAMETHOXAZOLE-TRIMETHOPRIM 800-160 MG PO TABS
1.0000 | ORAL_TABLET | Freq: Once | ORAL | Status: AC
  Administered 2017-10-29: 1 via ORAL
  Filled 2017-10-29: qty 1

## 2017-10-29 NOTE — Discharge Instructions (Signed)
Continue previous medications and use Lidoderm patches as directed.

## 2017-10-29 NOTE — ED Provider Notes (Signed)
[facility] Emergency Department Provider Note   ____________________________________________   First MD Initiated Contact with Patient 10/29/17 1044     (approximate)  I have reviewed the triage vital signs and the nursing notes.   HISTORY  Chief Complaint Knee Pain    HPI Shelly Wright is a 47 y.o. female patient presents with left knee pain secondary to degenerative joints.  Patient is a candidate for knee replacement has to lose approximate 15 more pounds of weight before surgery.  Patient state no relief with anti-inflammatory muscle relaxer prescribed.  Patient denies any recent injury.  Patient has right knee replacement.  Patient is followed by pain management secondary to fibromyalgia, neurogenic pain and opiate dependency.   Past Medical History:  Diagnosis Date  . Acute right-sided low back pain with right-sided sciatica 08/30/2014  . Anxiety   . Arthritis   . Asthma    in past, no current inhalers  . Bipolar disorder (HCC)   . Cervical spinal cord compression (HCC) 05/07/2012  . DDD (degenerative disc disease), lumbar   . Depression   . Fibromyalgia   . Hypertension   . Neuritis or radiculitis due to rupture of lumbar intervertebral disc 05/23/2014  . Periprosthetic fracture around prosthetic knee 07/31/2015  . Restless leg syndrome   . Soft tissue lesion of shoulder region 01/24/2012  . 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 Syncope and collapse 04/08/2014  . Thoracic and lumbosacral neuritis 05/07/2012  . Thoracic neuritis 05/07/2012    Patient Active Problem List   Diagnosis Date Noted  . Hypercholesteremia 08/26/2017  . 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 Chronic sacroiliac joint pain (Location of Primary Source of Pain) (Bilateral) (L>R) 07/09/2016  . Lumbar facet syndrome (Bilateral) (L>R) 07/09/2016  . Neurogenic pain 07/09/2016   . Osteoarthritis 07/09/2016  . Chronic knee pain after total knee replacement (Right) 07/09/2016  . Marijuana use 06/25/2016  . Airway hyperreactivity 05/06/2016  . Bipolar 1 disorder (HCC) 05/06/2016  . BP (high blood pressure) 05/06/2016  . Irregular cardiac rhythm 05/06/2016  . Extreme obesity 05/06/2016  . Long term current use of anticoagulant therapy (Lovenox) 05/06/2016  . Long term current use of opiate analgesic 05/06/2016  . Long term prescription opiate use 05/06/2016  . Opiate use 05/06/2016  . Opioid dependence, uncomplicated (HCC) 05/06/2016  . Chronic hip pain (Location of Primary Source of Pain) (Bilateral) (L>R) 05/06/2016  . Chronic neck pain (Bilateral) (L>R) 05/06/2016  . Chronic knee pain (Bilateral) (L>R) 05/06/2016  . Radiculopathy of cervical region (B)(R>L)(Thumb + Index fingers) 12/21/2015  . Chronic shoulder pain (Location of Tertiary source of pain) (Bilateral) (L>R) 12/21/2015  . S/P TKR Arthroplasty (Right) 07/13/2015  . Osteoarthritis of knee (Left) 11/17/2014  . DDD (degenerative disc disease), lumbar 08/30/2014  . Lumbar canal stenosis (L3-4 and L4-5) 05/23/2014  . Anxiety and depression 04/25/2014  . Fibromyalgia 04/25/2014  . Personal history of perinatal problems 04/25/2014  . Chronic low back pain (Location of Secondary source of pain) (Bilateral) (L>R) 04/25/2014  . Avitaminosis D 04/25/2014  . H/O syncope 04/25/2014  . Hyperlipidemia 04/25/2014  . Breathlessness on exertion 04/20/2014  . PVC's (premature ventricular contractions) 04/08/2014  . Cardiac conduction disorder 03/26/2014  . Hypercholesterolemia without hypertriglyceridemia 03/26/2014  . Chronic pain syndrome 12/30/2013  . Cervical post-laminectomy syndrome (Anterior-posterior approach) (2014) 12/30/2013  . Depression, major, single episode 05/08/2013  . Depression 05/08/2013  .  Thoracic or lumbosacral neuritis or radiculitis 05/07/2012    Past Surgical History:  Procedure  Laterality Date  . ABDOMINAL HYSTERECTOMY    . Cervical Fusion X 2    . JOINT REPLACEMENT Right 01/2016   Dr Gavin PottersKernodle  . KNEE ARTHROSCOPY Bilateral    Right knee scope 1998, Left knee scope  . TONSILLECTOMY    . TOTAL KNEE ARTHROPLASTY Right 02/01/2015   Procedure: TOTAL KNEE ARTHROPLASTY;  Surgeon: Erin SonsHarold Kernodle, MD;  Location: [facility] ORS;  Service: Orthopedics;  Laterality: Right;  . TUBAL LIGATION      Prior to Admission medications   Medication Sig Start Date End Date Taking? Authorizing Provider  albuterol (VENTOLIN HFA) 108 (90 Base) MCG/ACT inhaler TAKE 2 PUFFS INTO LUNGS EVERY 6 HOURS ASNEEDED FOR WHEEZING 06/10/16   [provider]  ARIPiprazole (ABILIFY) 10 MG tablet Take 10 mg by mouth daily.    [provider]  diclofenac (VOLTAREN) 75 MG EC tablet Take 1 tablet (75 mg total) by mouth 2 (two) times daily. 08/26/17 11/24/17  Barbette MerinoKing, Crystal M, NP  gabapentin (NEURONTIN) 800 MG tablet Take 1 tablet (800 mg total) by mouth every 6 (six) hours. 08/26/17 11/24/17  Barbette MerinoKing, Crystal M, NP  hydrochlorothiazide (HYDRODIURIL) 25 MG tablet Take 25 mg by mouth daily.  05/02/16   [provider]  hydrOXYzine (ATARAX/VISTARIL) 50 MG tablet Take 50 mg by mouth 3 (three) times daily.    [provider]  lidocaine (LIDODERM) 5 % Place 1 patch onto the skin daily. Remove & Discard patch within 12 hours or as directed by MD 10/29/17 10/29/18  Joni ReiningSmith, Ronald K, PA-C  methocarbamol (ROBAXIN) 750 MG tablet Take 1 tablet (750 mg total) by mouth every 8 (eight) hours as needed for muscle spasms. 09/30/17   Barbette MerinoKing, Crystal M, NP  simvastatin (ZOCOR) 20 MG tablet Take 20 mg by mouth daily at 6 PM.    [provider]  sulfamethoxazole-trimethoprim (BACTRIM DS,SEPTRA DS) 800-160 MG tablet Take 1 tablet by mouth 2 (two) times daily. 10/29/17   Joni ReiningSmith, Ronald K, PA-C  tiZANidine (ZANAFLEX) 4 MG tablet Take 1 tablet (4 mg total) by mouth every 6 (six) hours as needed for muscle spasms.  10/07/16 01/05/17  Delano MetzNaveira, Francisco, MD  traZODone (DESYREL) 150 MG tablet Take 150 mg by mouth at bedtime as needed.  05/11/15   [provider]  venlafaxine XR (EFFEXOR-XR) 75 MG 24 hr capsule TAKE 3 CAPSULES BY MOUTH EVERY MORNING 07/07/15   [provider]    Allergies Patient has no known allergies.  Family History  Problem Relation Age of Onset  . Breast cancer Mother   . Colon cancer Father   . Heart attack Father     Social History Social History   Tobacco Use  . Smoking status: Current Every Day Smoker    Packs/day: 1.00    Types: Cigarettes    Last attempt to quit: 01/18/2013    Years since quitting: 4.7  . Smokeless tobacco: Never Used  Substance Use Topics  . Alcohol use: No  . Drug use: No    Review of Systems  Constitutional: No fever/chills Eyes: No visual changes. ENT: No sore throat. Cardiovascular: Denies chest pain. Respiratory: Denies shortness of breath. Gastrointestinal: No abdominal pain.  No nausea, no vomiting.  No diarrhea.  No constipation. Genitourinary: Negative for dysuria. Musculoskeletal: Negative for back pain. Skin: Negative for rash. Neurological: Negative for headaches, focal weakness or numbness. Psychiatric:Anxiety, bipolar, and depression.   ____________________________________________  PHYSICAL EXAM:  VITAL SIGNS: ED Triage Vitals  Enc Vitals Group     BP 10/29/17 1038 131/78     Pulse Rate 10/29/17 1038 93     Resp 10/29/17 1038 18     Temp 10/29/17 1038 98.1 F (36.7 C)     Temp Source 10/29/17 1038 Oral     SpO2 10/29/17 1038 97 %     Weight 10/29/17 1041 259 lb (117.5 kg)     Height 10/29/17 1041 5\' 6"  (1.676 m)     Head Circumference --      Peak Flow --      Pain Score 10/29/17 1036 10     Pain Loc --      Pain Edu? --      Excl. in GC? --     Constitutional: Alert and oriented. Well appearing and in no acute distress. Cardiovascular: Normal rate, regular rhythm. Grossly normal heart  sounds.  Good peripheral circulation. Respiratory: Normal respiratory effort.  No retractions. Lungs CTAB. Musculoskeletal: No obvious knee deformity.  Moderate guarding palpation femur/tibular joint.  No joint effusions. Neurologic:  Normal speech and language. No gross focal neurologic deficits are appreciated. No gait instability. Skin: Papular lesion left axillary area on erythematous base.   Psychiatric: Mood and affect are normal. Speech and behavior are normal.  ____________________________________________   LABS (all labs ordered are listed, but only abnormal results are displayed)  Labs Reviewed - No data to display ____________________________________________  EKG   ____________________________________________  RADIOLOGY  ED MD interpretation:    Official radiology report(s): No results found.  ____________________________________________   PROCEDURES  Procedure(s) performed: None  Procedures  Critical Care performed: No  ____________________________________________   INITIAL IMPRESSION / ASSESSMENT AND PLAN / ED COURSE  As part of my medical decision making, I reviewed the following data within the electronic MEDICAL RECORD NUMBER    Left knee pain secondary to degenerative joint disease.  Patient given injection of Demerol but advised no narcotic pain medication will be prescribed unless by pain management.  Patient given a prescription for Lidoderm patch.  Patient abscess was nonfluctuant and discussed rationale for not incised and drained at this time.  Patient advised to stop Bactrim as directed and follow discharge care instructions for skin abscess.  Return right ED if condition worsens.      ____________________________________________   FINAL CLINICAL IMPRESSION(S) / ED DIAGNOSES  Final diagnoses:  Acute pain of left knee  Abscess of axilla, left     ED Discharge Orders        Ordered    lidocaine (LIDODERM) 5 %  Every 24 hours      10/29/17 1058    sulfamethoxazole-trimethoprim (BACTRIM DS,SEPTRA DS) 800-160 MG tablet  2 times daily     10/29/17 1058       Note:  This document was prepared using Dragon voice recognition software and Bodie include unintentional dictation errors.    Joni Reining, PA-C 10/29/17 1123    Arnaldo Natal, MD 10/29/17 [phone]

## 2017-10-29 NOTE — ED Triage Notes (Signed)
Pt reports left knee is bone on bone and she needs surgery but she has to lose a certain amount of weight before they will do it. Pt states that yesterday her knee started hurting worse. Denies injuries.

## 2017-10-29 NOTE — ED Notes (Signed)
See triage note  conts to have left knee pain  States she needs something for pain   Is currently using Volteran gel and Robaxin  But is having no relief

## 2017-11-04 ENCOUNTER — Telehealth: Payer: Self-pay | Admitting: Nurse Practitioner

## 2017-11-04 NOTE — Telephone Encounter (Signed)
Patient asking if we could do a PA on Lidoderm patch, written by ED physician. Patient informed that must be done by prescribing physician.

## 2017-11-04 NOTE — Telephone Encounter (Signed)
Patient lvmail stating she went to ED and received a script for Pain. Cannot get filled due to needing a prior auth. Wants to know if she can get script written from our office for pain patches

## 2017-11-10 ENCOUNTER — Telehealth: Payer: Self-pay | Admitting: Nurse Practitioner

## 2017-11-10 NOTE — Telephone Encounter (Signed)
Spoke with patient and she is wanting a refill for robaxin, states that pharmacy sent a request.  I told her that we don't  Usually refill medications per pharmacy request and that since she has not been here since 08/26/17 that she will need an appt with CRystal Brooke DareKing NP.  Transferred up front for appt scheduling.

## 2017-11-10 NOTE — Telephone Encounter (Signed)
Please call patient to discuss her medications. She says she needs refill.  Med refill appt is in October

## 2017-11-17 ENCOUNTER — Encounter: Payer: Self-pay | Admitting: Nurse Practitioner

## 2017-11-17 ENCOUNTER — Telehealth: Payer: Self-pay | Admitting: *Deleted

## 2017-11-17 ENCOUNTER — Other Ambulatory Visit: Payer: Self-pay | Admitting: Nurse Practitioner

## 2017-11-17 ENCOUNTER — Ambulatory Visit: Payer: Medicaid Other | Attending: Nurse Practitioner | Admitting: Nurse Practitioner

## 2017-11-17 VITALS — BP 113/75 | HR 77 | Temp 98.1°F | Resp 16 | Ht 66.0 in | Wt 260.0 lb

## 2017-11-17 DIAGNOSIS — M25551 Pain in right hip: Secondary | ICD-10-CM | POA: Diagnosis not present

## 2017-11-17 DIAGNOSIS — G629 Polyneuropathy, unspecified: Secondary | ICD-10-CM | POA: Insufficient documentation

## 2017-11-17 DIAGNOSIS — M5412 Radiculopathy, cervical region: Secondary | ICD-10-CM | POA: Diagnosis not present

## 2017-11-17 DIAGNOSIS — Z5181 Encounter for therapeutic drug level monitoring: Secondary | ICD-10-CM | POA: Diagnosis not present

## 2017-11-17 DIAGNOSIS — G2581 Restless legs syndrome: Secondary | ICD-10-CM | POA: Insufficient documentation

## 2017-11-17 DIAGNOSIS — M533 Sacrococcygeal disorders, not elsewhere classified: Secondary | ICD-10-CM | POA: Insufficient documentation

## 2017-11-17 DIAGNOSIS — F319 Bipolar disorder, unspecified: Secondary | ICD-10-CM | POA: Insufficient documentation

## 2017-11-17 DIAGNOSIS — M961 Postlaminectomy syndrome, not elsewhere classified: Secondary | ICD-10-CM | POA: Insufficient documentation

## 2017-11-17 DIAGNOSIS — M5417 Radiculopathy, lumbosacral region: Secondary | ICD-10-CM | POA: Insufficient documentation

## 2017-11-17 DIAGNOSIS — M25559 Pain in unspecified hip: Secondary | ICD-10-CM

## 2017-11-17 DIAGNOSIS — M542 Cervicalgia: Secondary | ICD-10-CM | POA: Insufficient documentation

## 2017-11-17 DIAGNOSIS — Z79891 Long term (current) use of opiate analgesic: Secondary | ICD-10-CM | POA: Insufficient documentation

## 2017-11-17 DIAGNOSIS — M1712 Unilateral primary osteoarthritis, left knee: Secondary | ICD-10-CM | POA: Diagnosis not present

## 2017-11-17 DIAGNOSIS — M545 Low back pain: Secondary | ICD-10-CM | POA: Diagnosis not present

## 2017-11-17 DIAGNOSIS — M25562 Pain in left knee: Secondary | ICD-10-CM

## 2017-11-17 DIAGNOSIS — F17210 Nicotine dependence, cigarettes, uncomplicated: Secondary | ICD-10-CM | POA: Diagnosis not present

## 2017-11-17 DIAGNOSIS — G8929 Other chronic pain: Secondary | ICD-10-CM

## 2017-11-17 DIAGNOSIS — E559 Vitamin D deficiency, unspecified: Secondary | ICD-10-CM | POA: Diagnosis not present

## 2017-11-17 DIAGNOSIS — E7800 Pure hypercholesterolemia, unspecified: Secondary | ICD-10-CM | POA: Insufficient documentation

## 2017-11-17 DIAGNOSIS — G894 Chronic pain syndrome: Secondary | ICD-10-CM | POA: Diagnosis not present

## 2017-11-17 DIAGNOSIS — M5136 Other intervertebral disc degeneration, lumbar region: Secondary | ICD-10-CM | POA: Insufficient documentation

## 2017-11-17 DIAGNOSIS — J45909 Unspecified asthma, uncomplicated: Secondary | ICD-10-CM | POA: Diagnosis not present

## 2017-11-17 DIAGNOSIS — M19011 Primary osteoarthritis, right shoulder: Secondary | ICD-10-CM | POA: Diagnosis not present

## 2017-11-17 DIAGNOSIS — Z6841 Body Mass Index (BMI) 40.0 and over, adult: Secondary | ICD-10-CM | POA: Insufficient documentation

## 2017-11-17 DIAGNOSIS — I10 Essential (primary) hypertension: Secondary | ICD-10-CM | POA: Insufficient documentation

## 2017-11-17 DIAGNOSIS — M48061 Spinal stenosis, lumbar region without neurogenic claudication: Secondary | ICD-10-CM | POA: Insufficient documentation

## 2017-11-17 DIAGNOSIS — M25552 Pain in left hip: Secondary | ICD-10-CM | POA: Insufficient documentation

## 2017-11-17 DIAGNOSIS — M150 Primary generalized (osteo)arthritis: Secondary | ICD-10-CM

## 2017-11-17 DIAGNOSIS — I493 Ventricular premature depolarization: Secondary | ICD-10-CM | POA: Insufficient documentation

## 2017-11-17 DIAGNOSIS — M792 Neuralgia and neuritis, unspecified: Secondary | ICD-10-CM

## 2017-11-17 DIAGNOSIS — E669 Obesity, unspecified: Secondary | ICD-10-CM | POA: Insufficient documentation

## 2017-11-17 DIAGNOSIS — M25561 Pain in right knee: Secondary | ICD-10-CM | POA: Insufficient documentation

## 2017-11-17 DIAGNOSIS — F419 Anxiety disorder, unspecified: Secondary | ICD-10-CM | POA: Insufficient documentation

## 2017-11-17 DIAGNOSIS — M159 Polyosteoarthritis, unspecified: Secondary | ICD-10-CM

## 2017-11-17 DIAGNOSIS — Z96651 Presence of right artificial knee joint: Secondary | ICD-10-CM | POA: Insufficient documentation

## 2017-11-17 DIAGNOSIS — M5414 Radiculopathy, thoracic region: Secondary | ICD-10-CM | POA: Diagnosis not present

## 2017-11-17 DIAGNOSIS — M19012 Primary osteoarthritis, left shoulder: Secondary | ICD-10-CM | POA: Insufficient documentation

## 2017-11-17 DIAGNOSIS — M797 Fibromyalgia: Secondary | ICD-10-CM | POA: Insufficient documentation

## 2017-11-17 DIAGNOSIS — Z79899 Other long term (current) drug therapy: Secondary | ICD-10-CM | POA: Insufficient documentation

## 2017-11-17 MED ORDER — GABAPENTIN 800 MG PO TABS: 800.0000 mg | ORAL_TABLET | Freq: Four times a day (QID) | ORAL | 5 refills | Status: DC

## 2017-11-17 MED ORDER — METHOCARBAMOL 750 MG PO TABS: 750.0000 mg | ORAL_TABLET | Freq: Three times a day (TID) | ORAL | 0 refills | Status: DC | PRN

## 2017-11-17 MED ORDER — DICLOFENAC SODIUM 75 MG PO TBEC: 75.0000 mg | DELAYED_RELEASE_TABLET | Freq: Two times a day (BID) | ORAL | 5 refills | Status: DC

## 2017-11-17 MED ORDER — TIZANIDINE HCL 4 MG PO TABS: 4.0000 mg | ORAL_TABLET | Freq: Four times a day (QID) | ORAL | 5 refills | Status: DC | PRN

## 2017-11-17 MED ORDER — LIDOCAINE 5 % EX OINT: 1.0000 "application " | TOPICAL_OINTMENT | Freq: Four times a day (QID) | CUTANEOUS | 2 refills | Status: DC | PRN

## 2017-11-17 NOTE — Patient Instructions (Addendum)
____________________________________________________________________________________________  Medication Rules  Applies to: All patients receiving prescriptions (written or electronic).  Pharmacy of record: Pharmacy where electronic prescriptions will be sent. If written prescriptions are taken to a different pharmacy, please inform the nursing staff. The pharmacy listed in the electronic medical record should be the one where you would like electronic prescriptions to be sent.  Prescription refills: Only during scheduled appointments. Applies to both, written and electronic prescriptions.  NOTE: The following applies primarily to controlled substances (Opioid* Pain Medications).   Patient's responsibilities: 1. Pain Pills: Bring all pain pills to every appointment (except for procedure appointments). 2. Pill Bottles: Bring pills in original pharmacy bottle. Always bring newest bottle. Bring bottle, even if empty. 3. Medication refills: You are responsible for knowing and keeping track of what medications you need refilled. The day before your appointment, write a list of all prescriptions that need to be refilled. Bring that list to your appointment and give it to the admitting nurse. Prescriptions will be written only during appointments. If you forget a medication, it will not be "Called in", "Faxed", or "electronically sent". You will need to get another appointment to get these prescribed. 4. Prescription Accuracy: You are responsible for carefully inspecting your prescriptions before leaving our office. Have the discharge nurse carefully go over each prescription with you, before taking them home. Make sure that your name is accurately spelled, that your address is correct. Check the name and dose of your medication to make sure it is accurate. Check the number of pills, and the written instructions to make sure they are clear and accurate. Make sure that you are given enough medication to last  until your next medication refill appointment. 5. Taking Medication: Take medication as prescribed. Never take more pills than instructed. Never take medication more frequently than prescribed. Taking less pills or less frequently is permitted and encouraged, when it comes to controlled substances (written prescriptions).  6. Inform other Doctors: Always inform, all of your healthcare providers, of all the medications you take. 7. Pain Medication from other Providers: You are not allowed to accept any additional pain medication from any other Doctor or Healthcare provider. There are two exceptions to this rule. (see below) In the event that you require additional pain medication, you are responsible for notifying us, as stated below. 8. Medication Agreement: You are responsible for carefully reading and following our Medication Agreement. This must be signed before receiving any prescriptions from our practice. Safely store a copy of your signed Agreement. Violations to the Agreement will result in no further prescriptions. (Additional copies of our Medication Agreement are available upon request.) 9. Laws, Rules, & Regulations: All patients are expected to follow all Federal and State Laws, Statutes, Rules, & Regulations. Ignorance of the Laws does not constitute a valid excuse. The use of any illegal substances is prohibited. 10. Adopted CDC guidelines & recommendations: Target dosing levels will be at or below 60 MME/day. Use of benzodiazepines** is not recommended.  Exceptions: There are only two exceptions to the rule of not receiving pain medications from other Healthcare Providers. 1. Exception #1 (Emergencies): In the event of an emergency (i.e.: accident requiring emergency care), you are allowed to receive additional pain medication. However, you are responsible for: As soon as you are able, call our office [phone], at any time of the day or night, and leave a message stating your name, the  date and nature of the emergency, and the name and dose of the medication   prescribed. In the event that your call is answered by a member of our staff, make sure to document and save the date, time, and the name of the person that took your information.  2. Exception #2 (Planned Surgery): In the event that you are scheduled by another doctor or dentist to have any type of surgery or procedure, you are allowed (for a period no longer than 30 days), to receive additional pain medication, for the acute post-op pain. However, in this case, you are responsible for picking up a copy of our "Post-op Pain Management for Surgeons" handout, and giving it to your surgeon or dentist. This document is available at our office, and does not require an appointment to obtain it. Simply go to our office during business hours (Monday-Thursday from 8:00 AM to 4:00 PM) (Friday 8:00 AM to 12:00 Noon) or if you have a scheduled appointment with us, prior to your surgery, and ask for it by name. In addition, you will need to provide us with your name, name of your surgeon, type of surgery, and date of procedure or surgery.  *Opioid medications include: morphine, codeine, oxycodone, oxymorphone, hydrocodone, hydromorphone, meperidine, tramadol, tapentadol, buprenorphine, fentanyl, methadone. **Benzodiazepine medications include: diazepam (Valium), alprazolam (Xanax), clonazepam (Klonopine), lorazepam (Ativan), clorazepate (Tranxene), chlordiazepoxide (Librium), estazolam (Prosom), oxazepam (Serax), temazepam (Restoril), triazolam (Halcion) (Last updated: 07/17/2017) ____________________________________________________________________________________________    BMI Assessment: Estimated body mass index is 41.97 kg/m as calculated from the following:   Height as of this encounter: 5\' 6"  (1.676 m).   Weight as of this encounter: 260 lb (117.9 kg).  BMI interpretation table: BMI level Category Range association with higher  incidence of chronic pain  <18 kg/m2 Underweight   18.5-24.9 kg/m2 Ideal body weight   25-29.9 kg/m2 Overweight Increased incidence by 20%  30-34.9 kg/m2 Obese (Class I) Increased incidence by 68%  35-39.9 kg/m2 Severe obesity (Class II) Increased incidence by 136%  >40 kg/m2 Extreme obesity (Class III) Increased incidence by 254%   Patient's current BMI Ideal Body weight  Body mass index is 41.97 kg/m. Ideal body weight: 59.3 kg (130 lb 11.7 oz) Adjusted ideal body weight: 82.8 kg (182 lb 7 oz)   BMI Readings from Last 4 Encounters:  11/17/17 41.97 kg/m  10/29/17 41.80 kg/m  08/26/17 42.29 kg/m  09/11/16 44.06 kg/m   Wt Readings from Last 4 Encounters:  11/17/17 260 lb (117.9 kg)  10/29/17 259 lb (117.5 kg)  08/26/17 262 lb (118.8 kg)  09/11/16 273 lb (123.8 kg)

## 2017-11-17 NOTE — Progress Notes (Signed)
Safety precautions to be maintained throughout the outpatient stay will include: orient to surroundings, keep bed in low position, maintain call bell within reach at all times, provide assistance with transfer out of bed and ambulation.  

## 2017-11-17 NOTE — Telephone Encounter (Signed)
Called and let patient know that Rx has been escribed for Robaxin.

## 2017-11-17 NOTE — Telephone Encounter (Signed)
Patient called asking about Robaxin Rx.  Zanaflex was escribed instead robaxin.  Told her I would speak to Crystal about this and let her know her reply.

## 2017-11-17 NOTE — Progress Notes (Signed)
Patient's Name: Shelly Wright  MRN: [id number]  Referring Provider: Angelene Giovanni Wright*  DOB: March 20, 1971  PCP: Shelly Wright  DOS: 11/17/2017  Note by: Shelly Francois NP  Service setting: Ambulatory outpatient  Specialty: Interventional Pain Management  Location: [facility] (AMB) Pain Management Facility    Patient type: Established    Primary Reason(s) for Visit: Encounter for prescription drug management. (Level of risk: moderate)  CC: Knee Pain (left) and Back Pain (lower worse on the left )  HPI  Shelly Wright is a 47 y.o. year old, female patient, who comes today for a medication management evaluation. She has Anxiety and depression; Asthma; Bipolar 1 disorder ([place]); Cardiac conduction disorder; Chronic pain syndrome; DDD (degenerative disc disease), lumbar; Fibromyalgia; Personal history of perinatal problems; BP (high blood pressure); Irregular cardiac rhythm; Chronic low back pain (Location of Secondary source of pain) (Bilateral) (L>R); Lumbar canal stenosis (L3-4 and L4-5); Major depressive disorder, single episode; Extreme obesity; Cervical post-laminectomy syndrome (Anterior-posterior approach) (2014); Osteoarthritis of knee (Left); Hypercholesterolemia without hypertriglyceridemia; Breathlessness on exertion; Thoracic or lumbosacral neuritis or radiculitis; Vitamin D deficiency; Long term current use of anticoagulant therapy (Lovenox); Long term current use of opiate analgesic; Long term prescription opiate use; Opiate use; Opioid dependence, uncomplicated ([place]); Radiculopathy of cervical region (B)(R>L)(Thumb + Index fingers); Chronic shoulder pain (Location of Tertiary source of pain) (Bilateral) (L>R); S/P TKR Arthroplasty (Right); Chronic hip pain (Location of Primary Source of Pain) (Bilateral) (L>R); Chronic neck pain (Bilateral) (L>R); Chronic knee pain (Bilateral) (L>R); Marijuana use; 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 Her primarily concern today is the Knee Pain (left) and Back Pain (lower worse on the left )  Pain Assessment: Location: Lower, Left, Right Back(knee pain on the left) Radiating: into the hips  Onset: More than a month ago Duration: Chronic pain Quality: Discomfort, Sharp, Aching, Constant Severity: 7 /10 (subjective, self-reported pain score)  Note: Reported level is compatible with observation.                          Effect on ADL: reports that she has to sit to wash dishes and cook.  has a shower chair Timing: Constant Modifying factors: resting and relaxing BP: 113/75  HR: 77  Shelly Wright was last scheduled for an appointment on 11/10/2017 for medication management. During today's appointment we reviewed Shelly Wright's chronic pain status, as well as her outpatient medication regimen. She admits that she was seen in emergency room for increase in knee pain. She was given a prescription for Lidoderm patches. However she was not able to get these filled secondary to insurance and prior authorization.  The patient  reports that she does not use drugs. Her body mass index is 41.97 kg/m.  Further details on both, my assessment(s), as well as the proposed treatment plan, please see below.  Laboratory Chemistry  Inflammation Markers (CRP: Acute Phase) (ESR: Chronic Phase) Lab Results  Component Value Date   CRP <0.80 05/06/2016   ESRSEDRATE 2 05/06/2016                         Rheumatology Markers No results found for: RF, ANA, [place], [place], [place], [place], [id number]  Renal Function Markers Lab Results  Component Value Date   BUN 17 05/06/2016   CREATININE 0.96  05/06/2016   GFRAA >60 05/06/2016   GFRNONAA >60 05/06/2016                             Hepatic Function Markers Lab Results  Component Value Date   AST 23 05/06/2016   ALT 27 05/06/2016   ALBUMIN 4.5 05/06/2016   ALKPHOS 86 05/06/2016                        Electrolytes Lab Results  Component Value Date   NA 136 05/06/2016   K 3.9 05/06/2016   CL 100 (L) 05/06/2016   CALCIUM 9.3 05/06/2016   MG 2.1 05/06/2016                        Neuropathy Markers Lab Results  Component Value Date   VITAMINB12 319 05/06/2016                        Bone Pathology Markers Lab Results  Component Value Date   25OHVITD1 56 05/06/2016   25OHVITD2 9.2 05/06/2016   25OHVITD3 47 05/06/2016                         Coagulation Parameters Lab Results  Component Value Date   INR 0.93 05/06/2016   LABPROT 12.5 05/06/2016   APTT 29 05/06/2016   PLT 287 05/06/2016                        Cardiovascular Markers Lab Results  Component Value Date   HGB 15.4 05/06/2016   HCT 45.5 05/06/2016                         CA Markers No results found for: CEA, CA125, LABCA2                      Note: Lab results reviewed.  Recent Diagnostic Imaging Results  DG C-Arm 1-60 Min-No Report Fluoroscopy was utilized by the requesting physician.  No radiographic  interpretation.   Complexity Note: Imaging results reviewed. Results shared with Shelly Wright, using Layman's terms.                         Meds   Current Outpatient Medications:  .  albuterol (VENTOLIN HFA) 108 (90 Base) MCG/ACT inhaler, TAKE 2 PUFFS INTO LUNGS EVERY 6 HOURS ASNEEDED FOR WHEEZING, Disp: , Rfl:  .  ARIPiprazole (ABILIFY) 10 MG tablet, Take 10 mg by mouth daily., Disp: , Rfl:  .  diclofenac sodium (VOLTAREN) 1 % GEL, Apply 4 g topically 3 (three) times daily., Disp: , Rfl:  .  gabapentin (NEURONTIN) 800 MG tablet, Take 1 tablet (800 mg total) by mouth every 6 (six) hours., Disp: 120 tablet, Rfl: 5 .  hydrochlorothiazide  (HYDRODIURIL) 25 MG tablet, Take 25 mg by mouth daily. , Disp: , Rfl: 1 .  hydrOXYzine (ATARAX/VISTARIL) 50 MG tablet, Take 50 mg by mouth 3 (three) times daily., Disp: , Rfl:  .  simvastatin (ZOCOR) 20 MG tablet, Take 20 mg by mouth daily at 6 PM., Disp: , Rfl:  .  tiotropium (SPIRIVA HANDIHALER) 18 MCG inhalation capsule,  Place 18 mcg into inhaler and inhale daily., Disp: , Rfl:  .  traZODone (DESYREL) 150 MG tablet, Take 150 mg by mouth at bedtime as needed. , Disp: , Rfl:  .  venlafaxine XR (EFFEXOR-XR) 75 MG 24 hr capsule, TAKE 3 CAPSULES BY MOUTH EVERY MORNING, Disp: , Rfl:  .  diclofenac (VOLTAREN) 75 MG EC tablet, Take 1 tablet (75 mg total) by mouth 2 (two) times daily., Disp: 60 tablet, Rfl: 5 .  lidocaine (LIDODERM) 5 %, Place 1 patch onto the skin daily. Remove & Discard patch within 12 hours or as directed by MD (Patient not taking: Reported on 11/17/2017), Disp: 10 patch, Rfl: 2 .  lidocaine (XYLOCAINE) 5 % ointment, Apply 1 application topically 4 (four) times daily as needed for moderate pain., Disp: 35.44 g, Rfl: 2 .  methocarbamol (ROBAXIN) 750 MG tablet, Take 1 tablet (750 mg total) by mouth every 8 (eight) hours as needed for muscle spasms., Disp: 90 tablet, Rfl: 0 .  sulfamethoxazole-trimethoprim (BACTRIM DS,SEPTRA DS) 800-160 MG tablet, Take 1 tablet by mouth 2 (two) times daily. (Patient not taking: Reported on 11/17/2017), Disp: 20 tablet, Rfl: 0  ROS  Constitutional: Denies any fever or chills Gastrointestinal: No reported hemesis, hematochezia, vomiting, or acute GI distress Musculoskeletal: Denies any acute onset joint swelling, redness, loss of ROM, or weakness Neurological: No reported episodes of acute onset apraxia, aphasia, dysarthria, agnosia, amnesia, paralysis, loss of coordination, or loss of consciousness  Allergies  Ms. Ganser has No Known Allergies.  PFSH  Drug: Ms. Hobson  reports that she does not use drugs. Alcohol:  reports that she does not drink  alcohol. Tobacco:  reports that she has been smoking cigarettes.  She has been smoking about 1.00 pack per day. She has never used smokeless tobacco. 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 Surgical: Ms. Schellenberg  has a past surgical history that includes Tonsillectomy; Knee arthroscopy (Bilateral); Cervical Fusion X 2; Abdominal hysterectomy; Tubal ligation; Total knee arthroplasty (Right, 02/01/2015); and Joint replacement (Right, 01/2016). Family: family history includes Breast cancer in her mother; Colon cancer in her father; Heart attack in her father.  Constitutional Exam  General appearance: Well nourished, well developed, and well hydrated. In no apparent acute distress Vitals:   11/17/17 1032  BP: 113/75  Pulse: 77  Resp: 16  Temp: 98.1 F (36.7 C)  TempSrc: Oral  SpO2: 97%  Weight: 260 lb (117.9 kg)  Height: '5\' 6"'  (1.676 m)  Psych/Mental status: Alert, oriented x 3 (person, place, & time)       Eyes: PERLA Respiratory: No evidence of acute respiratory distress  Gait & Posture Assessment  Ambulation: Unassisted Gait: Relatively normal for age and body habitus Posture: WNL   Lower Extremity Exam    Side: Right lower extremity  Side: Left lower extremity  Stability: No instability observed           Stability: No instability observed          Skin &  Extremity Inspection: Skin color, temperature, and hair growth are WNL. No peripheral edema or cyanosis. No masses, redness, swelling, asymmetry, or associated skin lesions. No contractures.  Skin & Extremity Inspection: Skin color, temperature, and hair growth are WNL. No peripheral edema or cyanosis. No masses, redness, swelling, asymmetry, or associated skin lesions. No contractures.  Functional ROM: Unrestricted ROM                  Functional ROM: Unrestricted ROM                  Muscle Tone/Strength: Functionally intact. No obvious neuro-muscular anomalies detected.  Muscle Tone/Strength: Functionally intact. No obvious neuro-muscular anomalies detected.  Sensory (Neurological): Unimpaired  Sensory (Neurological): Unimpaired  Palpation: No palpable anomalies  Palpation: No palpable anomalies   Assessment  Primary Diagnosis & Pertinent Problem List: The primary encounter diagnosis was Chronic low back pain (Location of Secondary source of pain) (Bilateral) (L>R). Diagnoses of Chronic knee pain (Bilateral) (L>R), Chronic hip pain (Location of Primary Source of Pain) (Bilateral) (L>R), Primary osteoarthritis involving multiple joints, Neurogenic pain, and Fibromyalgia were also pertinent to this visit.  Status Diagnosis  Controlled Persistent Controlled 1. Chronic low back pain (Location of Secondary source of pain) (Bilateral) (L>R)   2. Chronic knee pain (Bilateral) (L>R)   3. Chronic hip pain (Location of Primary Source of Pain) (Bilateral) (L>R)   4. Primary osteoarthritis involving multiple joints   5. Neurogenic pain   6. Fibromyalgia     Problems updated and reviewed during this visit: Problem  Vitamin D Deficiency  Asthma  Major Depressive Disorder, Single Episode   Plan of Wright  Pharmacotherapy (Medications Ordered): Meds ordered this encounter  Medications  . DISCONTD: tiZANidine (ZANAFLEX) 4 MG tablet    Sig:  Take 1 tablet (4 mg total) by mouth every 6 (six) hours as needed for muscle spasms.    Dispense:  120 tablet    Refill:  5    Do not place medication on "Automatic Refill". Fill one day early if pharmacy is closed on scheduled refill date.    Order Specific Question:   Supervising Provider    Answer:   Milinda Pointer [phone]  . gabapentin (NEURONTIN) 800 MG tablet    Sig: Take 1 tablet (800 mg total) by mouth every 6 (six) hours.    Dispense:  120 tablet    Refill:  5    Do not place this medication, or any other prescription from our practice, on "Automatic Refill". Patient Kapur have prescription filled one day early if pharmacy is closed on scheduled refill date.    Order Specific Question:   Supervising Provider    Answer:   Milinda Pointer [phone]  . diclofenac (VOLTAREN) 75 MG EC tablet    Sig: Take 1 tablet (75 mg total) by mouth 2 (two) times daily.    Dispense:  60 tablet    Refill:  5    Do not place medication on "Automatic Refill". Fill one day early if pharmacy is closed on scheduled refill date.    Order Specific Question:   Supervising Provider    Answer:   Milinda Pointer [phone]  . lidocaine (XYLOCAINE) 5 % ointment    Sig: Apply 1 application topically 4 (four) times daily as needed for moderate pain.    Dispense:  35.44 g    Refill:  2    Maximum dose: 5 g/application (approximately 6 inches of ointment); 20 g/day    Order Specific  Question:   Supervising Provider    Answer:   Milinda Pointer [phone]   New Prescriptions   LIDOCAINE (XYLOCAINE) 5 % OINTMENT    Apply 1 application topically 4 (four) times daily as needed for moderate pain.   Medications administered today: Bethann Punches. [place] had no medications administered during this visit. Lab-work, procedure(s), and/or referral(s): No orders of the defined types were placed in this encounter.  Imaging and/or referral(s): None   Interventional therapies: Planned, scheduled, and/or  pending: Not at this time   Considering: Diagnostic bilateral lumbar facet block Possible bilateral lumbar facet RFA Diagnostic bilateral intra-articular S-Ijoint injection Possible bilateral S-Ijoint RFA Diagnostic bilateral intra-articular shoulder joint injection Diagnostic bilateral suprascapular nerve block Possible bilateral suprascapular RFA Diagnostic left intra-articular knee joint injection Possible series of 5 [place] intra-articular left knee joint injections Diagnostic bilateral genicular nerve blocks Possible bilateral genicular nerve RFA Diagnostic bilateral cervical facet blocks Possible bilateral cervical facet RFA Diagnostic left cervical epidural steroid injection   Palliative PRN treatment(s): Diagnostic bilateral lumbar facet block+ Diagnostic bilateral intra-articular S-Ijoint injection      Provider-requested follow-up: Return in about 6 months (around 05/20/2018) for MedMgmt with Me Donella Stade Edison Pace).  Future Appointments  Date Time Provider [place]  05/04/2018 10:30 AM Shelly Francois, NP [facility] None   Primary Wright Physician: Shelly Wright Location: [facility] Out[facility] Management Facility Note by: Shelly Francois NP Date: 11/17/2017; Time: 3:34 PM  Pain Score Disclaimer: We use the NRS-11 scale. This is a self-reported, subjective measurement of pain severity with only modest accuracy. It is used primarily to identify changes within a particular patient. It must be understood that outpatient pain scales are significantly less accurate that those used for research, where they can be applied under ideal controlled circumstances with minimal exposure to variables. In reality, the score is likely to be a combination of pain intensity and pain affect, where pain affect describes the degree of emotional arousal or changes in action readiness caused by the sensory experience of pain. Factors such as social and work  situation, setting, emotional state, anxiety levels, expectation, and prior pain experience [place] influence pain perception and show large inter-individual differences that [place] also be affected by time variables.  Patient instructions provided during this appointment: Patient Instructions   ____________________________________________________________________________________________  Medication Rules  Applies to: All patients receiving prescriptions (written or electronic).  Pharmacy of record: Pharmacy where electronic prescriptions will be sent. If written prescriptions are taken to a different pharmacy, please inform the nursing staff. The pharmacy listed in the electronic medical record should be the one where you would like electronic prescriptions to be sent.  Prescription refills: Only during scheduled appointments. Applies to both, written and electronic prescriptions.  NOTE: The following applies primarily to controlled substances (Opioid* Pain Medications).   Patient's responsibilities: 1. Pain Pills: Bring all pain pills to every appointment (except for procedure appointments). 2. Pill Bottles: Bring pills in original pharmacy bottle. Always bring newest bottle. Bring bottle, even if empty. 3. Medication refills: You are responsible for knowing and keeping track of what medications you need refilled. The day before your appointment, write a list of all prescriptions that need to be refilled. Bring that list to your appointment and give it to the admitting nurse. Prescriptions will be written only during appointments. If you forget a medication, it will not be "Called in", "Faxed", or "electronically sent". You will need to get another appointment to get these prescribed. 4. Prescription Accuracy: You  are responsible for carefully inspecting your prescriptions before leaving our office. Have the discharge nurse carefully go over each prescription with you, before taking them home. Make  sure that your name is accurately spelled, that your address is correct. Check the name and dose of your medication to make sure it is accurate. Check the number of pills, and the written instructions to make sure they are clear and accurate. Make sure that you are given enough medication to last until your next medication refill appointment. 5. Taking Medication: Take medication as prescribed. Never take more pills than instructed. Never take medication more frequently than prescribed. Taking less pills or less frequently is permitted and encouraged, when it comes to controlled substances (written prescriptions).  6. Inform other Doctors: Always inform, all of your healthcare providers, of all the medications you take. 7. Pain Medication from other Providers: You are not allowed to accept any additional pain medication from any other Doctor or Healthcare provider. There are two exceptions to this rule. (see below) In the event that you require additional pain medication, you are responsible for notifying us, as stated below. 8. Medication Agreement: You are responsible for carefully reading and following our Medication Agreement. This must be signed before receiving any prescriptions from our practice. Safely store a copy of your signed Agreement. Violations to the Agreement will result in no further prescriptions. (Additional copies of our Medication Agreement are available upon request.) 9. Laws, Rules, & Regulations: All patients are expected to follow all Federal and [employer], [employer], Rules, [facility]. Ignorance of the Laws does not constitute a valid excuse. The use of any illegal substances is prohibited. 10. Adopted CDC guidelines & recommendations: Target dosing levels will be at or below 60 MME/day. Use of benzodiazepines** is not recommended.  Exceptions: There are only two exceptions to the rule of not receiving pain medications from other Healthcare Providers. 1. Exception #1  (Emergencies): In the event of an emergency (i.e.: accident requiring emergency Wright), you are allowed to receive additional pain medication. However, you are responsible for: As soon as you are able, call our office [phone], at any time of the day or night, and leave a message stating your name, the date and nature of the emergency, and the name and dose of the medication prescribed. In the event that your call is answered by a member of our staff, make sure to document and save the date, time, and the name of the person that took your information.  2. Exception #2 (Planned Surgery): In the event that you are scheduled by another doctor or dentist to have any type of surgery or procedure, you are allowed (for a period no longer than 30 days), to receive additional pain medication, for the acute post-op pain. However, in this case, you are responsible for picking up a copy of our "Post-op Pain Management for Surgeons" handout, and giving it to your surgeon or dentist. This document is available at our office, and does not require an appointment to obtain it. Simply go to our office during business hours (Monday-Thursday from 8:00 AM to 4:00 PM) (Friday 8:00 AM to 12:00 Noon) or if you have a scheduled appointment with [place], prior to your surgery, and ask for it by name. In addition, you will need to provide [place] with your name, name of your surgeon, type of surgery, and date of procedure or surgery.  *Opioid medications include: morphine, codeine, oxycodone, oxymorphone, hydrocodone, hydromorphone, meperidine, tramadol, tapentadol, buprenorphine, fentanyl, methadone. **  Benzodiazepine medications include: diazepam (Valium), alprazolam (Xanax), clonazepam (Klonopine), lorazepam (Ativan), clorazepate (Tranxene), chlordiazepoxide (Librium), estazolam (Prosom), oxazepam (Serax), temazepam (Restoril), triazolam (Halcion) (Last updated:  07/17/2017) ____________________________________________________________________________________________    BMI Assessment: Estimated body mass index is 41.97 kg/m as calculated from the following:   Height as of this encounter: '5\' 6"'  (1.676 m).   Weight as of this encounter: 260 lb (117.9 kg).  BMI interpretation table: BMI level Category Range association with higher incidence of chronic pain  <18 kg/m2 Underweight   18.5-24.9 kg/m2 Ideal body weight   25-29.9 kg/m2 Overweight Increased incidence by 20%  30-34.9 kg/m2 Obese (Class I) Increased incidence by 68%  35-39.9 kg/m2 Severe obesity (Class II) Increased incidence by 136%  >40 kg/m2 Extreme obesity (Class III) Increased incidence by 254%   Patient's current BMI Ideal Body weight  Body mass index is 41.97 kg/m. Ideal body weight: 59.3 kg (130 lb 11.7 oz) Adjusted ideal body weight: 82.8 kg (182 lb 7 oz)   BMI Readings from Last 4 Encounters:  11/17/17 41.97 kg/m  10/29/17 41.80 kg/m  08/26/17 42.29 kg/m  09/11/16 44.06 kg/m   Wt Readings from Last 4 Encounters:  11/17/17 260 lb (117.9 kg)  10/29/17 259 lb (117.5 kg)  08/26/17 262 lb (118.8 kg)  09/11/16 273 lb (123.8 kg)

## 2017-11-17 NOTE — Telephone Encounter (Signed)
New Rx Sent . Please apologize we were on the Lidocaine and it was not discussed.

## 2018-02-25 ENCOUNTER — Encounter: Payer: Medicaid Other | Admitting: Nurse Practitioner

## 2018-04-27 ENCOUNTER — Other Ambulatory Visit: Payer: Self-pay | Admitting: Nurse Practitioner

## 2018-04-27 DIAGNOSIS — M792 Neuralgia and neuritis, unspecified: Secondary | ICD-10-CM

## 2018-04-27 DIAGNOSIS — M797 Fibromyalgia: Secondary | ICD-10-CM

## 2018-05-04 ENCOUNTER — Other Ambulatory Visit: Payer: Self-pay

## 2018-05-04 ENCOUNTER — Ambulatory Visit: Payer: Medicaid Other | Attending: Nurse Practitioner | Admitting: Nurse Practitioner

## 2018-05-04 ENCOUNTER — Encounter: Payer: Self-pay | Admitting: Nurse Practitioner

## 2018-05-04 VITALS — BP 125/67 | HR 63 | Temp 97.8°F | Ht 66.0 in | Wt 249.0 lb

## 2018-05-04 DIAGNOSIS — M549 Dorsalgia, unspecified: Secondary | ICD-10-CM | POA: Diagnosis present

## 2018-05-04 DIAGNOSIS — M797 Fibromyalgia: Secondary | ICD-10-CM | POA: Diagnosis not present

## 2018-05-04 DIAGNOSIS — Z79899 Other long term (current) drug therapy: Secondary | ICD-10-CM | POA: Diagnosis not present

## 2018-05-04 DIAGNOSIS — M1990 Unspecified osteoarthritis, unspecified site: Secondary | ICD-10-CM | POA: Diagnosis not present

## 2018-05-04 DIAGNOSIS — Z96651 Presence of right artificial knee joint: Secondary | ICD-10-CM | POA: Diagnosis not present

## 2018-05-04 DIAGNOSIS — M19012 Primary osteoarthritis, left shoulder: Secondary | ICD-10-CM | POA: Diagnosis not present

## 2018-05-04 DIAGNOSIS — M25561 Pain in right knee: Secondary | ICD-10-CM | POA: Insufficient documentation

## 2018-05-04 DIAGNOSIS — F419 Anxiety disorder, unspecified: Secondary | ICD-10-CM | POA: Insufficient documentation

## 2018-05-04 DIAGNOSIS — Z7901 Long term (current) use of anticoagulants: Secondary | ICD-10-CM | POA: Diagnosis not present

## 2018-05-04 DIAGNOSIS — E669 Obesity, unspecified: Secondary | ICD-10-CM | POA: Diagnosis not present

## 2018-05-04 DIAGNOSIS — M150 Primary generalized (osteo)arthritis: Secondary | ICD-10-CM

## 2018-05-04 DIAGNOSIS — G2581 Restless legs syndrome: Secondary | ICD-10-CM | POA: Insufficient documentation

## 2018-05-04 DIAGNOSIS — Z6841 Body Mass Index (BMI) 40.0 and over, adult: Secondary | ICD-10-CM | POA: Insufficient documentation

## 2018-05-04 DIAGNOSIS — E7800 Pure hypercholesterolemia, unspecified: Secondary | ICD-10-CM | POA: Insufficient documentation

## 2018-05-04 DIAGNOSIS — M48061 Spinal stenosis, lumbar region without neurogenic claudication: Secondary | ICD-10-CM | POA: Insufficient documentation

## 2018-05-04 DIAGNOSIS — F17210 Nicotine dependence, cigarettes, uncomplicated: Secondary | ICD-10-CM | POA: Insufficient documentation

## 2018-05-04 DIAGNOSIS — M25562 Pain in left knee: Secondary | ICD-10-CM | POA: Insufficient documentation

## 2018-05-04 DIAGNOSIS — E559 Vitamin D deficiency, unspecified: Secondary | ICD-10-CM | POA: Diagnosis not present

## 2018-05-04 DIAGNOSIS — M19011 Primary osteoarthritis, right shoulder: Secondary | ICD-10-CM | POA: Insufficient documentation

## 2018-05-04 DIAGNOSIS — Z79891 Long term (current) use of opiate analgesic: Secondary | ICD-10-CM | POA: Insufficient documentation

## 2018-05-04 DIAGNOSIS — E785 Hyperlipidemia, unspecified: Secondary | ICD-10-CM | POA: Insufficient documentation

## 2018-05-04 DIAGNOSIS — M1712 Unilateral primary osteoarthritis, left knee: Secondary | ICD-10-CM | POA: Diagnosis not present

## 2018-05-04 DIAGNOSIS — M533 Sacrococcygeal disorders, not elsewhere classified: Secondary | ICD-10-CM | POA: Diagnosis not present

## 2018-05-04 DIAGNOSIS — F1290 Cannabis use, unspecified, uncomplicated: Secondary | ICD-10-CM | POA: Insufficient documentation

## 2018-05-04 DIAGNOSIS — J45909 Unspecified asthma, uncomplicated: Secondary | ICD-10-CM | POA: Insufficient documentation

## 2018-05-04 DIAGNOSIS — M792 Neuralgia and neuritis, unspecified: Secondary | ICD-10-CM

## 2018-05-04 DIAGNOSIS — F319 Bipolar disorder, unspecified: Secondary | ICD-10-CM | POA: Diagnosis not present

## 2018-05-04 DIAGNOSIS — M5116 Intervertebral disc disorders with radiculopathy, lumbar region: Secondary | ICD-10-CM | POA: Insufficient documentation

## 2018-05-04 DIAGNOSIS — M159 Polyosteoarthritis, unspecified: Secondary | ICD-10-CM

## 2018-05-04 DIAGNOSIS — M961 Postlaminectomy syndrome, not elsewhere classified: Secondary | ICD-10-CM | POA: Diagnosis not present

## 2018-05-04 DIAGNOSIS — G8929 Other chronic pain: Secondary | ICD-10-CM

## 2018-05-04 DIAGNOSIS — M5412 Radiculopathy, cervical region: Secondary | ICD-10-CM | POA: Insufficient documentation

## 2018-05-04 DIAGNOSIS — G894 Chronic pain syndrome: Secondary | ICD-10-CM | POA: Insufficient documentation

## 2018-05-04 DIAGNOSIS — I10 Essential (primary) hypertension: Secondary | ICD-10-CM | POA: Insufficient documentation

## 2018-05-04 DIAGNOSIS — M5414 Radiculopathy, thoracic region: Secondary | ICD-10-CM | POA: Insufficient documentation

## 2018-05-04 MED ORDER — DICLOFENAC SODIUM 75 MG PO TBEC: 75.0000 mg | DELAYED_RELEASE_TABLET | Freq: Two times a day (BID) | ORAL | 5 refills | Status: DC

## 2018-05-04 MED ORDER — GABAPENTIN 800 MG PO TABS: 800.0000 mg | ORAL_TABLET | Freq: Four times a day (QID) | ORAL | 5 refills | Status: DC

## 2018-05-04 MED ORDER — LIDOCAINE 5 % EX OINT: 1.0000 "application " | TOPICAL_OINTMENT | Freq: Four times a day (QID) | CUTANEOUS | 2 refills | Status: DC | PRN

## 2018-05-04 MED ORDER — METHOCARBAMOL 750 MG PO TABS: 750.0000 mg | ORAL_TABLET | Freq: Three times a day (TID) | ORAL | 2 refills | Status: DC | PRN

## 2018-05-04 NOTE — Patient Instructions (Signed)
____________________________________________________________________________________________  Medication Rules  Purpose: To inform patients, and their family members, of our rules and regulations.  Applies to: All patients receiving prescriptions (written or electronic).  Pharmacy of record: Pharmacy where electronic prescriptions will be sent. If written prescriptions are taken to a different pharmacy, please inform the nursing staff. The pharmacy listed in the electronic medical record should be the one where you would like electronic prescriptions to be sent.  Electronic prescriptions: In compliance with the [place] [employer]rengthen Opioid Misuse Prevention (STOP) Act of 2017 (Session Law 2017-74/H243), effective May 20, 2018, all controlled substances must be electronically prescribed. Calling prescriptions to the pharmacy will cease to exist.  Prescription refills: Only during scheduled appointments. Applies to all prescriptions.  NOTE: The following applies primarily to controlled substances (Opioid* Pain Medications).   Patient's responsibilities: 1. Pain Pills: Bring all pain pills to every appointment (except for procedure appointments). 2. Pill Bottles: Bring pills in original pharmacy bottle. Always bring the newest bottle. Bring bottle, even if empty. 3. Medication refills: You are responsible for knowing and keeping track of what medications you take and those you need refilled. The day before your appointment: write a list of all prescriptions that need to be refilled. The day of the appointment: give the list to the admitting nurse. Prescriptions will be written only during appointments. If you forget a medication: it will not be "Called in", "Faxed", or "electronically sent". You will need to get another appointment to get these prescribed. No early refills. Do not call asking to have your prescription filled early. 4. Prescription Accuracy: You are responsible for  carefully inspecting your prescriptions before leaving our office. Have the discharge nurse carefully go over each prescription with you, before taking them home. Make sure that your name is accurately spelled, that your address is correct. Check the name and dose of your medication to make sure it is accurate. Check the number of pills, and the written instructions to make sure they are clear and accurate. Make sure that you are given enough medication to last until your next medication refill appointment. 5. Taking Medication: Take medication as prescribed. When it comes to controlled substances, taking less pills or less frequently than prescribed is permitted and encouraged. Never take more pills than instructed. Never take medication more frequently than prescribed.  6. Inform other Doctors: Always inform, all of your healthcare providers, of all the medications you take. 7. Pain Medication from other Providers: You are not allowed to accept any additional pain medication from any other Doctor or Healthcare provider. There are two exceptions to this rule. (see below) In the event that you require additional pain medication, you are responsible for notifying us, as stated below. 8. Medication Agreement: You are responsible for carefully reading and following our Medication Agreement. This must be signed before receiving any prescriptions from our practice. Safely store a copy of your signed Agreement. Violations to the Agreement will result in no further prescriptions. (Additional copies of our Medication Agreement are available upon request.) 9. Laws, Rules, & Regulations: All patients are expected to follow all Federal and State Laws, Statutes, Rules, & Regulations. Ignorance of the Laws does not constitute a valid excuse. The use of any illegal substances is prohibited. 10. Adopted CDC guidelines & recommendations: Target dosing levels will be at or below 60 MME/day. Use of benzodiazepines** is not  recommended.  Exceptions: There are only two exceptions to the rule of not receiving pain medications from other Healthcare Providers. 1.   Exception #1 (Emergencies): In the event of an emergency (i.e.: accident requiring emergency care), you are allowed to receive additional pain medication. However, you are responsible for: As soon as you are able, call our office [phone], at any time of the day or night, and leave a message stating your name, the date and nature of the emergency, and the name and dose of the medication prescribed. In the event that your call is answered by a member of our staff, make sure to document and save the date, time, and the name of the person that took your information.  2. Exception #2 (Planned Surgery): In the event that you are scheduled by another doctor or dentist to have any type of surgery or procedure, you are allowed (for a period no longer than 30 days), to receive additional pain medication, for the acute post-op pain. However, in this case, you are responsible for picking up a copy of our "Post-op Pain Management for Surgeons" handout, and giving it to your surgeon or dentist. This document is available at our office, and does not require an appointment to obtain it. Simply go to our office during business hours (Monday-Thursday from 8:00 AM to 4:00 PM) (Friday 8:00 AM to 12:00 Noon) or if you have a scheduled appointment with us, prior to your surgery, and ask for it by name. In addition, you will need to provide us with your name, name of your surgeon, type of surgery, and date of procedure or surgery.  *Opioid medications include: morphine, codeine, oxycodone, oxymorphone, hydrocodone, hydromorphone, meperidine, tramadol, tapentadol, buprenorphine, fentanyl, methadone. **Benzodiazepine medications include: diazepam (Valium), alprazolam (Xanax), clonazepam (Klonopine), lorazepam (Ativan), clorazepate (Tranxene), chlordiazepoxide (Librium), estazolam (Prosom),  oxazepam (Serax), temazepam (Restoril), triazolam (Halcion) (Last updated: 07/17/2017) ____________________________________________________________________________________________    

## 2018-05-04 NOTE — Progress Notes (Signed)
Patient's Name: Shelly Wright  MRN: [id number]  Referring Provider: Angelene [facility]  DOB: 12-14-1970  PCP: [facility]  DOS: 05/04/2018  Note by: Vevelyn Francois NP  Service setting: Ambulatory outpatient  Specialty: Interventional Pain Management  Location: [facility] (AMB) Pain Management Facility    Patient type: Established    Primary Reason(s) for Visit: Encounter for prescription drug management. (Level of risk: moderate)  CC: Back Pain  HPI  Shelly Wright is a 47 y.o. year old, female patient, who comes today for a medication management evaluation. She has Anxiety and depression; Asthma; Bipolar 1 disorder ([place]); Cardiac conduction disorder; Chronic pain syndrome; DDD (degenerative disc disease), lumbar; Fibromyalgia; Personal history of perinatal problems; BP (high blood pressure); Irregular cardiac rhythm; Chronic low back pain (Location of Secondary source of pain) (Bilateral) (L>R); Lumbar canal stenosis (L3-4 and L4-5); Major depressive disorder, single episode; Extreme obesity; Cervical post-laminectomy syndrome (Anterior-posterior approach) (2014); Osteoarthritis of knee (Left); Hypercholesterolemia without hypertriglyceridemia; Breathlessness on exertion; Thoracic or lumbosacral neuritis or radiculitis; Vitamin D deficiency; Long term current use of anticoagulant therapy (Lovenox); Long term current use of opiate analgesic; Long term prescription opiate use; Opiate use; Opioid dependence, uncomplicated ([place]); Radiculopathy of cervical region (B)(R>L)(Thumb + Index fingers); Chronic shoulder pain (Location of Tertiary source of pain) (Bilateral) (L>R); S/P TKR Arthroplasty (Right); Chronic hip pain (Location of Primary Source of Pain) (Bilateral) (L>R); Chronic neck pain (Bilateral) (L>R); Chronic knee pain (Bilateral) (L>R); Marijuana use; 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 Her primarily concern today is the Back Pain  Pain Assessment: Location: Left Back Radiating: pain radiaties down to buttock and legs Onset: More than a month ago Duration: Chronic pain Quality: Aching, Sharp Severity: 5 /10 (subjective, self-reported pain score)  Note: Reported level is compatible with observation.                          Effect on ADL: limits my daily activities Timing: Constant Modifying factors: sit down in recliner, medications BP: 125/67  HR: 63  Shelly Wright was last scheduled for an appointment on 04/27/2018 for medication management. During today's appointment we reviewed Shelly Wright's chronic pain status, as well as her outpatient medication regimen. She has pain that goes past the knee. She has continued left knee pain. She is planning to have a left TKR in the future with the continued weight loss, goal 245 pounds.   The patient  reports no history of drug use. Her body mass index is 40.19 kg/m.  Further details on both, my assessment(s), as well as the proposed treatment plan, please see below.  Laboratory Chemistry  Inflammation Markers (CRP: Acute Phase) (ESR: Chronic Phase) Lab Results  Component Value Date   CRP <0.80 05/06/2016   ESRSEDRATE 2 05/06/2016                         Rheumatology Markers No results found for: RF, ANA, LABURIC, URICUR, LYMEIGGIGMAB, LYMEABIGMQN, HLAB27                      Renal Function Markers Lab Results  Component Value Date   BUN 17 05/06/2016   CREATININE 0.96 05/06/2016  GFRAA >60 05/06/2016   GFRNONAA >60 05/06/2016                             Hepatic Function Markers Lab Results  Component Value Date   AST 23 05/06/2016   ALT 27 05/06/2016   ALBUMIN 4.5  05/06/2016   ALKPHOS 86 05/06/2016                        Electrolytes Lab Results  Component Value Date   NA 136 05/06/2016   K 3.9 05/06/2016   CL 100 (L) 05/06/2016   CALCIUM 9.3 05/06/2016   MG 2.1 05/06/2016                        Neuropathy Markers Lab Results  Component Value Date   VITAMINB12 319 05/06/2016                        CNS Tests No results found for: COLORCSF, APPEARCSF, RBCCOUNTCSF, WBCCSF, POLYSCSF, LYMPHSCSF, EOSCSF, PROTEINCSF, GLUCCSF, JCVIRUS, CSFOLI, IGGCSF                      Bone Pathology Markers Lab Results  Component Value Date   25OHVITD1 56 05/06/2016   25OHVITD2 9.2 05/06/2016   25OHVITD3 47 05/06/2016                         Coagulation Parameters Lab Results  Component Value Date   INR 0.93 05/06/2016   LABPROT 12.5 05/06/2016   APTT 29 05/06/2016   PLT 287 05/06/2016                        Cardiovascular Markers Lab Results  Component Value Date   HGB 15.4 05/06/2016   HCT 45.5 05/06/2016                         CA Markers No results found for: CEA, CA125, LABCA2                      Note: Lab results reviewed.  Recent Diagnostic Imaging Results  DG C-Arm 1-60 Min-No Report Fluoroscopy was utilized by the requesting physician.  No radiographic  interpretation.   Complexity Note: Imaging results reviewed. Results shared with Shelly Wright, using Layman's terms.                         Meds   Current Outpatient Medications:  .  albuterol (VENTOLIN HFA) 108 (90 Base) MCG/ACT inhaler, TAKE 2 PUFFS INTO LUNGS EVERY 6 HOURS ASNEEDED FOR WHEEZING, Disp: , Rfl:  .  ARIPiprazole (ABILIFY) 10 MG tablet, Take 10 mg by mouth daily., Disp: , Rfl:  .  diclofenac (VOLTAREN) 75 MG EC tablet, Take 1 tablet (75 mg total) by mouth 2 (two) times daily., Disp: 60 tablet, Rfl: 5 .  gabapentin (NEURONTIN) 800 MG tablet, Take 1 tablet (800 mg total) by mouth every 6 (six) hours., Disp: 120 tablet, Rfl: 5 .  hydrochlorothiazide (HYDRODIURIL)  25 MG tablet, Take 25 mg by mouth daily. , Disp: , Rfl: 1 .  hydrOXYzine (ATARAX/VISTARIL) 50 MG tablet, Take 50 mg by mouth 3 (three) times daily., Disp: , Rfl:  .  methocarbamol (ROBAXIN) 750 MG  tablet, Take 1 tablet (750 mg total) by mouth every 8 (eight) hours as needed for muscle spasms., Disp: 90 tablet, Rfl: 2 .  simvastatin (ZOCOR) 20 MG tablet, Take 20 mg by mouth daily at 6 PM., Disp: , Rfl:  .  traZODone (DESYREL) 150 MG tablet, Take 150 mg by mouth at bedtime as needed. , Disp: , Rfl:  .  venlafaxine XR (EFFEXOR-XR) 75 MG 24 hr capsule, TAKE 3 CAPSULES BY MOUTH EVERY MORNING, Disp: , Rfl:  .  lidocaine (XYLOCAINE) 5 % ointment, Apply 1 application topically 4 (four) times daily as needed for moderate pain., Disp: 35.44 g, Rfl: 2 .  tiotropium (SPIRIVA HANDIHALER) 18 MCG inhalation capsule, Place 18 mcg into inhaler and inhale daily., Disp: , Rfl:   ROS  Constitutional: Denies any fever or chills Gastrointestinal: No reported hemesis, hematochezia, vomiting, or acute GI distress Musculoskeletal: Denies any acute onset joint swelling, redness, loss of ROM, or weakness Neurological: No reported episodes of acute onset apraxia, aphasia, dysarthria, agnosia, amnesia, paralysis, loss of coordination, or loss of consciousness  Allergies  Shelly Wright has No Known Allergies.  PFSH  Drug: Shelly Wright  reports no history of drug use. Alcohol:  reports no history of alcohol use. Tobacco:  reports that she has been smoking cigarettes. She has been smoking about 1.00 pack per day. She has never used smokeless tobacco. 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 Surgical: Shelly Wright  has a past surgical history that includes Tonsillectomy; Knee arthroscopy (Bilateral); Cervical Fusion X 2; Abdominal hysterectomy; Tubal ligation; Total knee arthroplasty (Right, 02/01/2015); and Joint replacement (Right, 01/2016). Family: family history includes Breast cancer in her mother; Colon cancer in her father; Heart attack in her father.  Constitutional Exam  General appearance: Well nourished, well developed, and well hydrated. In no apparent acute distress Vitals:   05/04/18 1106  BP: 125/67  Pulse: 63  Temp: 97.8 F (36.6 C)  SpO2: 100%  Weight: 249 lb (112.9 kg)  Height: '5\' 6"'  (1.676 m)  Psych/Mental status: Alert, oriented x 3 (person, place, & time)       Eyes: PERLA Respiratory: No evidence of acute respiratory distress  Lumbar Spine Area Exam  Skin & Axial Inspection: No masses, redness, or swelling Alignment: Symmetrical Functional ROM: Unrestricted ROM       Stability: No instability detected Muscle Tone/Strength: Functionally intact. No obvious neuro-muscular anomalies detected. Sensory (Neurological): Unimpaired Palpation: No palpable anomalies         Gait & Posture Assessment  Ambulation: Unassisted Gait: Relatively normal for age and body habitus Posture: WNL   Lower Extremity Exam    Side: Right lower extremity  Side: Left lower extremity  Stability: No instability observed          Stability: No instability observed          Skin & Extremity Inspection: Evidence of prior arthroplastic surgery  Skin & Extremity Inspection: Skin color, temperature, and hair growth are WNL. No peripheral  edema or cyanosis. No masses, redness, swelling, asymmetry, or associated skin lesions. No contractures.  Functional ROM: Unrestricted ROM                  Functional ROM: Unrestricted ROM                  Muscle Tone/Strength: Functionally intact. No obvious neuro-muscular anomalies detected.  Muscle Tone/Strength: Functionally intact. No obvious neuro-muscular anomalies detected.  Sensory (Neurological): Unimpaired        Sensory (Neurological): Unimpaired            Palpation: No palpable anomalies  Palpation: No palpable anomalies   Assessment  Primary Diagnosis & Pertinent Problem List: The primary encounter diagnosis was Chronic knee pain (Bilateral) (L>R). Diagnoses of Neurogenic pain, Primary osteoarthritis involving multiple joints, Fibromyalgia, Cervical post-laminectomy syndrome (Anterior-posterior approach) (2014), Chronic sacroiliac joint pain (Location of Primary Source of Pain) (Bilateral) (L>R), and Chronic pain syndrome were also pertinent to this visit.  Status Diagnosis  Persistent Controlled Controlled 1. Chronic knee pain (Bilateral) (L>R)   2. Neurogenic pain   3. Primary osteoarthritis involving multiple joints   4. Fibromyalgia   5. Cervical post-laminectomy syndrome (Anterior-posterior approach) (2014)   6. Chronic sacroiliac joint pain (Location of Primary Source of Pain) (Bilateral) (L>R)   7. Chronic pain syndrome     Problems updated and reviewed during this visit: No problems updated. Plan of Care  Pharmacotherapy (Medications Ordered): Meds ordered this encounter  Medications  . diclofenac (VOLTAREN) 75 MG EC tablet    Sig: Take 1 tablet (75 mg total) by mouth 2 (two) times daily.    Dispense:  60 tablet    Refill:  5    Do not place medication on "Automatic Refill". Fill one day early if pharmacy is closed on scheduled refill date.    Order Specific Question:   Supervising Provider    Answer:   Milinda Pointer [phone]  . gabapentin (NEURONTIN)  800 MG tablet    Sig: Take 1 tablet (800 mg total) by mouth every 6 (six) hours.    Dispense:  120 tablet    Refill:  5    Do not place this medication, or any other prescription from our practice, on "Automatic Refill". Patient Zeiders have prescription filled one day early if pharmacy is closed on scheduled refill date.    Order Specific Question:   Supervising Provider    Answer:   Milinda Pointer [phone]  . methocarbamol (ROBAXIN) 750 MG tablet    Sig: Take 1 tablet (750 mg total) by mouth every 8 (eight) hours as needed for muscle spasms.    Dispense:  90 tablet    Refill:  2    Do not place this medication, or any other prescription from our practice, on "Automatic Refill". Patient [place] have prescription filled one day early if pharmacy is closed on scheduled refill date.    Order Specific Question:   Supervising Provider    Answer:   Milinda Pointer [phone]  . lidocaine (XYLOCAINE) 5 % ointment    Sig: Apply 1 application topically 4 (four) times daily as needed for moderate pain.    Dispense:  35.44 g    Refill:  2    Maximum dose: 5 g/application (approximately 6 inches of ointment); 20 g/day    Order Specific Question:   Supervising Provider    Answer:   Milinda Pointer [phone]   New Prescriptions   No medications on file   Medications  administered today: Shelly Wright had no medications administered during this visit. Lab-work, procedure(s), and/or referral(s): No orders of the defined types were placed in this encounter.  Imaging and/or referral(s): None  Interventional therapies: Planned, scheduled, and/or pending: Not at this time   Considering: Diagnostic bilateral lumbar facet block Possible bilateral lumbar facet RFA Diagnostic bilateral intra-articular S-Ijoint injection Possible bilateral S-Ijoint RFA Diagnostic bilateral intra-articular shoulder joint injection Diagnostic bilateral suprascapular nerve block Possible bilateral  suprascapular RFA Diagnostic left intra-articular knee joint injection Possible series of 5 Hyalgan intra-articular left knee joint injections Diagnostic bilateral genicular nerve blocks Possible bilateral genicular nerve RFA Diagnostic bilateral cervical facet blocks Possible bilateral cervical facet RFA Diagnostic left cervical epidural steroid injection   Palliative PRN treatment(s): Diagnostic bilateral lumbar facet block+ Diagnostic bilateral intra-articular S-Ijoint injection    Provider-requested follow-up: Return in about 3 months (around 08/03/2018) for MedMgmt.  Future Appointments  Date Time Provider [place]  07/29/2018 10:30 AM Vevelyn Francois, NP [facility] None   Primary Care Physician: [facility] Location: [facility] Note by: Vevelyn Francois NP Date: 05/04/2018; Time: 1:49 PM  Pain Score Disclaimer: We use the NRS-11 scale. This is a self-reported, subjective measurement of pain severity with only modest accuracy. It is used primarily to identify changes within a particular patient. It must be understood that outpatient pain scales are significantly less accurate that those used for research, where they can be applied under ideal controlled circumstances with minimal exposure to variables. In reality, the score is likely to be a combination of pain intensity and pain affect, where pain affect describes the degree of emotional arousal or changes in action readiness caused by the sensory experience of pain. Factors such as social and work situation, setting, emotional state, anxiety levels, expectation, and prior pain experience [place] influence pain perception and show large inter-individual differences that [place] also be affected by time variables.  Patient instructions provided during this appointment: Patient Instructions   ____________________________________________________________________________________________  Medication Rules  Purpose: To inform patients, and their family members, of our rules and regulations.  Applies to: All patients receiving prescriptions (written or electronic).  Pharmacy of record: Pharmacy where electronic prescriptions will be sent. If written prescriptions are taken to a different pharmacy, please inform the nursing staff. The pharmacy listed in the electronic medical record should be the one where you would like electronic prescriptions to be sent.  Electronic prescriptions: In compliance with the [place] (STOP) Act of 2017 (Session Lanny Cramp [phone]), effective May 20, 2018, all controlled substances must be electronically prescribed. Calling prescriptions to the pharmacy will cease to exist.  Prescription refills: Only during scheduled appointments. Applies to all prescriptions.  NOTE: The following applies primarily to controlled substances (Opioid* Pain Medications).   Patient's responsibilities: 1. Pain Pills: Bring all pain pills to every appointment (except for procedure appointments). 2. Pill Bottles: Bring pills in original pharmacy bottle. Always bring the newest bottle. Bring bottle, even if empty. 3. Medication refills: You are responsible for knowing and keeping track of what medications you take and those you need refilled. The day before your appointment: write a list of all prescriptions that need to be refilled. The day of the appointment: give the list to the admitting nurse. Prescriptions will be written only during appointments. If you forget a medication: it will not be "Called in", "Faxed", or "electronically sent". You will need to get another appointment to get these prescribed. No  early refills. Do not call asking to have your prescription filled early. 4. Prescription Accuracy: You are responsible for  carefully inspecting your prescriptions before leaving our office. Have the discharge nurse carefully go over each prescription with you, before taking them home. Make sure that your name is accurately spelled, that your address is correct. Check the name and dose of your medication to make sure it is accurate. Check the number of pills, and the written instructions to make sure they are clear and accurate. Make sure that you are given enough medication to last until your next medication refill appointment. 5. Taking Medication: Take medication as prescribed. When it comes to controlled substances, taking less pills or less frequently than prescribed is permitted and encouraged. Never take more pills than instructed. Never take medication more frequently than prescribed.  6. Inform other Doctors: Always inform, all of your healthcare providers, of all the medications you take. 7. Pain Medication from other Providers: You are not allowed to accept any additional pain medication from any other Doctor or Healthcare provider. There are two exceptions to this rule. (see below) In the event that you require additional pain medication, you are responsible for notifying us, as stated below. 8. Medication Agreement: You are responsible for carefully reading and following our Medication Agreement. This must be signed before receiving any prescriptions from our practice. Safely store a copy of your signed Agreement. Violations to the Agreement will result in no further prescriptions. (Additional copies of our Medication Agreement are available upon request.) 9. Laws, Rules, & Regulations: All patients are expected to follow all Federal and [employer], [employer], Rules, [employer]. Ignorance of the Laws does not constitute a valid excuse. The use of any illegal substances is prohibited. 10. Adopted CDC guidelines & recommendations: Target dosing levels will be at or below 60 MME/day. Use of benzodiazepines** is not  recommended.  Exceptions: There are only two exceptions to the rule of not receiving pain medications from other Healthcare Providers. 1. Exception #1 (Emergencies): In the event of an emergency (i.e.: accident requiring emergency care), you are allowed to receive additional pain medication. However, you are responsible for: As soon as you are able, call our office [phone], at any time of the day or night, and leave a message stating your name, the date and nature of the emergency, and the name and dose of the medication prescribed. In the event that your call is answered by a member of our staff, make sure to document and save the date, time, and the name of the person that took your information.  2. Exception #2 (Planned Surgery): In the event that you are scheduled by another doctor or dentist to have any type of surgery or procedure, you are allowed (for a period no longer than 30 days), to receive additional pain medication, for the acute post-op pain. However, in this case, you are responsible for picking up a copy of our "Post-op Pain Management for Surgeons" handout, and giving it to your surgeon or dentist. This document is available at our office, and does not require an appointment to obtain it. Simply go to our office during business hours (Monday-Thursday from 8:00 AM to 4:00 PM) (Friday 8:00 AM to 12:00 Noon) or if you have a scheduled appointment with [place], prior to your surgery, and ask for it by name. In addition, you will need to provide [place] with your name, name of your surgeon, type of surgery, and date of procedure or surgery.  *  Opioid medications include: morphine, codeine, oxycodone, oxymorphone, hydrocodone, hydromorphone, meperidine, tramadol, tapentadol, buprenorphine, fentanyl, methadone. **Benzodiazepine medications include: diazepam (Valium), alprazolam (Xanax), clonazepam (Klonopine), lorazepam (Ativan), clorazepate (Tranxene), chlordiazepoxide (Librium), estazolam (Prosom),  oxazepam (Serax), temazepam (Restoril), triazolam (Halcion) (Last updated: 07/17/2017) ____________________________________________________________________________________________

## 2018-05-27 ENCOUNTER — Telehealth: Payer: Self-pay | Admitting: Nurse Practitioner

## 2018-05-27 NOTE — Telephone Encounter (Signed)
Voltaren needs PA.

## 2018-05-27 NOTE — Telephone Encounter (Signed)
Pt left voicemail stating that her rx needs PA

## 2018-05-27 NOTE — Telephone Encounter (Signed)
Patient lvmail stating her Diclofenac needs prior auth.

## 2018-05-27 NOTE — Telephone Encounter (Signed)
PA done

## 2018-07-29 ENCOUNTER — Ambulatory Visit: Payer: Medicaid Other | Attending: Nurse Practitioner | Admitting: Nurse Practitioner

## 2018-07-29 ENCOUNTER — Other Ambulatory Visit: Payer: Self-pay

## 2018-07-29 ENCOUNTER — Encounter: Payer: Self-pay | Admitting: Nurse Practitioner

## 2018-07-29 VITALS — BP 110/65 | HR 80 | Temp 97.8°F | Resp 16 | Ht 66.0 in | Wt 256.0 lb

## 2018-07-29 DIAGNOSIS — M19012 Primary osteoarthritis, left shoulder: Secondary | ICD-10-CM | POA: Diagnosis present

## 2018-07-29 DIAGNOSIS — G894 Chronic pain syndrome: Secondary | ICD-10-CM | POA: Diagnosis present

## 2018-07-29 DIAGNOSIS — G8929 Other chronic pain: Secondary | ICD-10-CM | POA: Insufficient documentation

## 2018-07-29 DIAGNOSIS — M47816 Spondylosis without myelopathy or radiculopathy, lumbar region: Secondary | ICD-10-CM | POA: Diagnosis present

## 2018-07-29 DIAGNOSIS — M961 Postlaminectomy syndrome, not elsewhere classified: Secondary | ICD-10-CM | POA: Diagnosis not present

## 2018-07-29 DIAGNOSIS — M7918 Myalgia, other site: Secondary | ICD-10-CM | POA: Insufficient documentation

## 2018-07-29 DIAGNOSIS — M19011 Primary osteoarthritis, right shoulder: Secondary | ICD-10-CM

## 2018-07-29 DIAGNOSIS — M792 Neuralgia and neuritis, unspecified: Secondary | ICD-10-CM | POA: Insufficient documentation

## 2018-07-29 DIAGNOSIS — M797 Fibromyalgia: Secondary | ICD-10-CM | POA: Insufficient documentation

## 2018-07-29 MED ORDER — LIDOCAINE 5 % EX OINT: 1.0000 "application " | TOPICAL_OINTMENT | Freq: Four times a day (QID) | CUTANEOUS | 2 refills | Status: DC | PRN

## 2018-07-29 MED ORDER — METHOCARBAMOL 750 MG PO TABS: 750.0000 mg | ORAL_TABLET | Freq: Four times a day (QID) | ORAL | 2 refills | Status: DC

## 2018-07-29 NOTE — Patient Instructions (Signed)
____________________________________________________________________________________________  Medication Rules  Purpose: To inform patients, and their family members, of our rules and regulations.  Applies to: All patients receiving prescriptions (written or electronic).  Pharmacy of record: Pharmacy where electronic prescriptions will be sent. If written prescriptions are taken to a different pharmacy, please inform the nursing staff. The pharmacy listed in the electronic medical record should be the one where you would like electronic prescriptions to be sent.  Electronic prescriptions: In compliance with the [place] Strengthen Opioid Misuse Prevention (STOP) Act of 2017 (Session Law 2017-74/H243), effective May 20, 2018, all controlled substances must be electronically prescribed. Calling prescriptions to the pharmacy will cease to exist.  Prescription refills: Only during scheduled appointments. Applies to all prescriptions.  NOTE: The following applies primarily to controlled substances (Opioid* Pain Medications).   Patient's responsibilities: 1. Pain Pills: Bring all pain pills to every appointment (except for procedure appointments). 2. Pill Bottles: Bring pills in original pharmacy bottle. Always bring the newest bottle. Bring bottle, even if empty. 3. Medication refills: You are responsible for knowing and keeping track of what medications you take and those you need refilled. The day before your appointment: write a list of all prescriptions that need to be refilled. The day of the appointment: give the list to the admitting nurse. Prescriptions will be written only during appointments. No prescriptions will be written on procedure days. If you forget a medication: it will not be "Called in", "Faxed", or "electronically sent". You will need to get another appointment to get these prescribed. No early refills. Do not call asking to have your prescription filled  early. 4. Prescription Accuracy: You are responsible for carefully inspecting your prescriptions before leaving our office. Have the discharge nurse carefully go over each prescription with you, before taking them home. Make sure that your name is accurately spelled, that your address is correct. Check the name and dose of your medication to make sure it is accurate. Check the number of pills, and the written instructions to make sure they are clear and accurate. Make sure that you are given enough medication to last until your next medication refill appointment. 5. Taking Medication: Take medication as prescribed. When it comes to controlled substances, taking less pills or less frequently than prescribed is permitted and encouraged. Never take more pills than instructed. Never take medication more frequently than prescribed.  6. Inform other Doctors: Always inform, all of your healthcare providers, of all the medications you take. 7. Pain Medication from other Providers: You are not allowed to accept any additional pain medication from any other Doctor or Healthcare provider. There are two exceptions to this rule. (see below) In the event that you require additional pain medication, you are responsible for notifying us, as stated below. 8. Medication Agreement: You are responsible for carefully reading and following our Medication Agreement. This must be signed before receiving any prescriptions from our practice. Safely store a copy of your signed Agreement. Violations to the Agreement will result in no further prescriptions. (Additional copies of our Medication Agreement are available upon request.) 9. Laws, Rules, & Regulations: All patients are expected to follow all Federal and State Laws, Statutes, Rules, & Regulations. Ignorance of the Laws does not constitute a valid excuse. The use of any illegal substances is prohibited. 10. Adopted CDC guidelines & recommendations: Target dosing levels will be  at or below 60 MME/day. Use of benzodiazepines** is not recommended.  Exceptions: There are only two exceptions to the rule of not   receiving pain medications from other Healthcare Providers. 1. Exception #1 (Emergencies): In the event of an emergency (i.e.: accident requiring emergency care), you are allowed to receive additional pain medication. However, you are responsible for: As soon as you are able, call our office [phone], at any time of the day or night, and leave a message stating your name, the date and nature of the emergency, and the name and dose of the medication prescribed. In the event that your call is answered by a member of our staff, make sure to document and save the date, time, and the name of the person that took your information.  2. Exception #2 (Planned Surgery): In the event that you are scheduled by another doctor or dentist to have any type of surgery or procedure, you are allowed (for a period no longer than 30 days), to receive additional pain medication, for the acute post-op pain. However, in this case, you are responsible for picking up a copy of our "Post-op Pain Management for Surgeons" handout, and giving it to your surgeon or dentist. This document is available at our office, and does not require an appointment to obtain it. Simply go to our office during business hours (Monday-Thursday from 8:00 AM to 4:00 PM) (Friday 8:00 AM to 12:00 Noon) or if you have a scheduled appointment with us, prior to your surgery, and ask for it by name. In addition, you will need to provide us with your name, name of your surgeon, type of surgery, and date of procedure or surgery.  *Opioid medications include: morphine, codeine, oxycodone, oxymorphone, hydrocodone, hydromorphone, meperidine, tramadol, tapentadol, buprenorphine, fentanyl, methadone. **Benzodiazepine medications include: diazepam (Valium), alprazolam (Xanax), clonazepam (Klonopine), lorazepam (Ativan), clorazepate  (Tranxene), chlordiazepoxide (Librium), estazolam (Prosom), oxazepam (Serax), temazepam (Restoril), triazolam (Halcion) (Last updated: 07/17/2017) ____________________________________________________________________________________________    

## 2018-07-29 NOTE — Progress Notes (Signed)
Patient's Name: Shelly Wright  MRN: [id number]  Referring Provider: [facility]  DOB: [birth year]  PCP: [facility]  DOS: 07/29/2018  Note by: Thad Ranger, NP  Service setting: Ambulatory outpatient  Specialty: Interventional Pain Management  Location: [facility] (AMB) Pain Management Facility    Patient type: Established   HPI  Reason for Visit: [name] is a 48 y.o. year old, female patient, who comes today with a chief complaint of Neck Pain (left is worse); Back Pain (lumbar bilateteral); and Knee Pain (bilateral ) Last Appointment: Her last appointment at our practice was on 05/27/2018. I last saw her on 05/27/2018.  Pain Assessment: Today, Ms. Salva describes the severity of the   as a 5 /10. She indicates the location/referral of the pain to be Neck(see visit info for additional sites. ) Left/neck pain into the left shoulder and into fingers causing numbness, back pain into hips and legs, right knee s/p joint replacement, left in need of joint replacement. . Onset was: More than a month ago. The quality of pain is described as Aching, Sharp, Constant, Radiating, Discomfort. Temporal description, or timing of pain is: Constant. Possible modifying factors: relaxing in recliner, medications take the edge off. . [name]'s  height is 5\' 6"  (1.676 m) and weight is 256 lb (116.1 kg). Her oral temperature is 97.8 F (36.6 C). Her blood pressure is 110/65 and her pulse is 80. Her respiration is 16 and oxygen saturation is 96%.  She is requesting to have her Methocarbamol increased to QID. She is currently taking it TID. She admits that it is effective for her left shoulder pain. She denies any new injuries. She admits that her should pain has gotten worse since the cervical spine surgery a few years ago.. She does have some numbess and tinling in her left arm that goes in to her 2 fingers.    ROS  Constitutional: Denies any fever or chills Gastrointestinal: No reported  hemesis, hematochezia, vomiting, or acute GI distress Musculoskeletal: Denies any acute onset joint swelling, redness, loss of ROM, or weakness Neurological: No reported episodes of acute onset apraxia, aphasia, dysarthria, agnosia, amnesia, paralysis, loss of coordination, or loss of consciousness  Medication Review  ARIPiprazole, albuterol, diclofenac, gabapentin, hydrOXYzine, hydrochlorothiazide, lidocaine, methocarbamol, simvastatin, tiotropium, traZODone, and venlafaxine XR  History Review  Allergy: [name] has No Known Allergies. Drug: [name]  reports no history of drug use. Alcohol:  reports no history of alcohol use. Tobacco:  reports that she has been smoking cigarettes. She has been smoking about 1.00 pack per day. She has never used smokeless tobacco. Social: [name]  reports that she has been smoking cigarettes. She has been smoking about 1.00 pack per day. She has never used smokeless tobacco. She reports that she does not drink alcohol or use drugs. 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 Surgical: [name]  has a past surgical history that includes Tonsillectomy; Knee arthroscopy (Bilateral); Cervical Fusion X  2; Abdominal hysterectomy; Tubal ligation; Total knee arthroplasty (Right, 02/01/2015);  and Joint replacement (Right, 01/2016). Family: family history includes Breast cancer in her mother; Colon cancer in her father; Heart attack in her father. Problem List: [name] does not have any pertinent problems on file.  Lab Review  Kidney Function Lab Results  Component Value Date   BUN 17 05/06/2016   CREATININE 0.96 05/06/2016   GFRAA >60 05/06/2016   GFRNONAA >60 05/06/2016  Liver Function Lab Results  Component Value Date   AST 23 05/06/2016   ALT 27 05/06/2016   ALBUMIN 4.5 05/06/2016  Note: Above Lab results reviewed.  Imaging Review  DG C-Arm 1-60 Min-No Report Fluoroscopy was utilized by the requesting physician.  No radiographic  interpretation.  Note: Reviewed        Physical Exam  General appearance: Well nourished, well developed, and well hydrated. In no apparent acute distress Mental status: Alert, oriented x 3 (person, place, & time)       Respiratory: No evidence of acute respiratory distress Eyes: PERLA Vitals: BP 110/65 (BP Location: Left Arm, Patient Position: Sitting, Cuff Size: Large)   Pulse 80   Temp 97.8 F (36.6 C) (Oral)   Resp 16   Ht 5\' 6"  (1.676 m)   Wt 256 lb (116.1 kg)   SpO2 96%   BMI 41.32 kg/m  BMI: Estimated body mass index is 41.32 kg/m as calculated from the following:   Height as of this encounter: 5\' 6"  (1.676 m).   Weight as of this encounter: 256 lb (116.1 kg). Ideal: Ideal body weight: 59.3 kg (130 lb 11.7 oz) Adjusted ideal body weight: 82 kg (180 lb 13.4 oz) Cervical Spine Area Exam  Skin & Axial Inspection: Well healed scar from previous spine surgery detected Alignment: Symmetrical Functional ROM: Adequate ROM      Stability: No instability detected Muscle Tone/Strength: Functionally intact. No obvious neuro-muscular anomalies detected. Sensory (Neurological): Unimpaired Palpation: Uncomfortable             Upper Extremity (UE) Exam    Side: Right upper extremity  Side: Left upper extremity  Skin & Extremity  Inspection: Skin color, temperature, and hair growth are WNL. No peripheral edema or cyanosis. No masses, redness, swelling, asymmetry, or associated skin lesions. No contractures.  Skin & Extremity Inspection: Skin color, temperature, and hair growth are WNL. No peripheral edema or cyanosis. No masses, redness, swelling, asymmetry, or associated skin lesions. No contractures.  Functional ROM: Unrestricted ROM          Functional ROM: Decreased ROM          Muscle Tone/Strength: Functionally intact. No obvious neuro-muscular anomalies detected.  Muscle Tone/Strength: Functionally intact. No obvious neuro-muscular anomalies detected.  Sensory (Neurological): Unimpaired          Sensory (Neurological): Dermatomal pain pattern affecting the shoulder going into digits 2 & 3  Palpation: No palpable anomalies              Palpation: No palpable anomalies              Provocative Test(s):  Phalen's test: deferred Tinel's test: deferred Apley's scratch test (touch opposite shoulder):  Action 1 (Across chest): deferred Action 2 (Overhead): deferred Action 3 (LB reach): deferred   Provocative Test(s):  Phalen's test: deferred Tinel's test: deferred Apley's scratch test (touch opposite shoulder):  Action 1 (Across chest): Decreased ROM Action 2 (Overhead): Decreased ROM Action 3 (LB reach): Decreased ROM    Assessment  Status Diagnosis  Persistent Persistent Persistent 1. 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 Cervical post-laminectomy syndrome (Anterior-posterior approach) (2014)   3. Lumbar facet syndrome (Bilateral) (L>R)   4. Neurogenic pain   5. Fibromyalgia   6. Chronic pain syndrome   7. Chronic musculoskeletal pain      Updated Problems: Problem  Chronic Musculoskeletal Pain    Plan of Care  Pharmacotherapy (Medications Ordered): Meds ordered this encounter  Medications  . lidocaine (XYLOCAINE) 5 % ointment    Sig: Apply 1 application topically 4 (four) times  daily as needed for moderate pain.    Dispense:  35.44 g    Refill:  2    Maximum dose: 5 g/application (approximately 6 inches of ointment); 20 g/day    Order Specific Question:   Supervising Provider    Answer:   Delano Metz [phone]  . methocarbamol (ROBAXIN) 750 MG tablet    Sig: Take 1 tablet (750 mg total) by mouth 4 (four) times daily.    Dispense:  120 tablet    Refill:  2    Do not place this medication, or any other prescription from our practice, on "Automatic Refill". Patient Hilmer have prescription filled one day early if pharmacy is closed on scheduled refill date.    Order Specific Question:   Supervising Provider    Answer:   Delano Metz [[id number]]   Administered today: Valentino Saxon. Kenner had no medications administered during this visit.  Orders:  Orders Placed This Encounter  Procedures  . DG Shoulder Left    Standing Status:   Future    Standing Expiration Date:   09/28/2019    Order Specific Question:   Reason for Exam (SYMPTOM  OR DIAGNOSIS REQUIRED)    Answer:   Left shoulder pain    Order Specific Question:   Is the patient pregnant?    Answer:   No    Order Specific Question:   Preferred imaging location?    Answer:   [facility]    Order Specific Question:   Call Results- Best Contact Number?    Answer:   [phone]   Follow-up plan:   Return in about 3 months (around 10/29/2018) for MedMgmt.  Not at this time.   Interventional options: Considering:   Diagnostic bilateral lumbar facet block Possible bilateral lumbar facet RFA Diagnostic bilateral intra-articular S-Ijoint injection Possible bilateral S-Ijoint RFA Diagnostic bilateral intra-articular shoulder joint injection Diagnostic bilateral suprascapular nerve block Possible bilateral suprascapular RFA Diagnostic left intra-articular knee joint injection Possible series of 5 Hyalgan intra-articular left knee joint injections Diagnostic bilateral genicular nerve  blocks Possible bilateral genicular nerve RFA Diagnostic bilateral cervical facet blocks Possible bilateral cervical facet RFA Diagnostic left cervical epidural steroid injection   Palliative PRN treatment(s):   Diagnostic bilateral lumbar facet block+ Diagnostic bilateral intra-articular S-Ijoint injection    Note by: Thad Ranger, NP Date: 07/29/2018; Time: 1:12 PM

## 2018-08-21 ENCOUNTER — Other Ambulatory Visit: Payer: Self-pay | Admitting: Physician Assistant

## 2018-09-16 ENCOUNTER — Ambulatory Visit (INDEPENDENT_AMBULATORY_CARE_PROVIDER_SITE_OTHER): Payer: Medicaid Other | Admitting: Psychiatry

## 2018-09-16 ENCOUNTER — Telehealth: Payer: Self-pay | Admitting: Nurse Practitioner

## 2018-09-16 ENCOUNTER — Other Ambulatory Visit: Payer: Self-pay

## 2018-09-16 ENCOUNTER — Encounter: Payer: Self-pay | Admitting: Psychiatry

## 2018-09-16 DIAGNOSIS — F3130 Bipolar disorder, current episode depressed, mild or moderate severity, unspecified: Secondary | ICD-10-CM | POA: Diagnosis not present

## 2018-09-16 DIAGNOSIS — F411 Generalized anxiety disorder: Secondary | ICD-10-CM | POA: Diagnosis not present

## 2018-09-16 DIAGNOSIS — Z634 Disappearance and death of family member: Secondary | ICD-10-CM

## 2018-09-16 DIAGNOSIS — F1220 Cannabis dependence, uncomplicated: Secondary | ICD-10-CM | POA: Diagnosis not present

## 2018-09-16 DIAGNOSIS — F17200 Nicotine dependence, unspecified, uncomplicated: Secondary | ICD-10-CM

## 2018-09-16 MED ORDER — ARIPIPRAZOLE 15 MG PO TABS: 15.0000 mg | ORAL_TABLET | Freq: Every day | ORAL | 0 refills | Status: DC

## 2018-09-16 NOTE — Telephone Encounter (Signed)
Pt called stating that she needs a PA for diclofenac
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
## 2018-09-16 NOTE — Progress Notes (Signed)
TC 09-16-18 @ 8:48 pt medication and pharmacy was reviewed and updated. Pt medical and surgical hx was reviewed and updated. Allergies reviewed with no updates. No vitals taken at this time phone visit.

## 2018-09-29 ENCOUNTER — Other Ambulatory Visit: Payer: Self-pay

## 2018-09-29 ENCOUNTER — Ambulatory Visit (INDEPENDENT_AMBULATORY_CARE_PROVIDER_SITE_OTHER): Payer: Medicaid Other | Admitting: Licensed Clinical Social Worker

## 2018-09-29 DIAGNOSIS — F3130 Bipolar disorder, current episode depressed, mild or moderate severity, unspecified: Secondary | ICD-10-CM

## 2018-10-01 ENCOUNTER — Other Ambulatory Visit: Payer: Self-pay

## 2018-10-01 ENCOUNTER — Encounter: Payer: Self-pay | Admitting: Psychiatry

## 2018-10-01 ENCOUNTER — Ambulatory Visit (INDEPENDENT_AMBULATORY_CARE_PROVIDER_SITE_OTHER): Payer: Medicaid Other | Admitting: Psychiatry

## 2018-10-01 DIAGNOSIS — F411 Generalized anxiety disorder: Secondary | ICD-10-CM

## 2018-10-01 DIAGNOSIS — F1220 Cannabis dependence, uncomplicated: Secondary | ICD-10-CM

## 2018-10-01 DIAGNOSIS — F3130 Bipolar disorder, current episode depressed, mild or moderate severity, unspecified: Secondary | ICD-10-CM | POA: Diagnosis not present

## 2018-10-01 DIAGNOSIS — Z634 Disappearance and death of family member: Secondary | ICD-10-CM | POA: Diagnosis not present

## 2018-10-01 DIAGNOSIS — F17200 Nicotine dependence, unspecified, uncomplicated: Secondary | ICD-10-CM

## 2018-10-01 MED ORDER — VENLAFAXINE HCL ER 75 MG PO CP24: 225.0000 mg | ORAL_CAPSULE | Freq: Every morning | ORAL | 0 refills | Status: DC

## 2018-10-01 MED ORDER — TRAZODONE HCL 100 MG PO TABS: 200.0000 mg | ORAL_TABLET | Freq: Every evening | ORAL | 0 refills | Status: DC | PRN

## 2018-10-01 NOTE — Progress Notes (Signed)
Virtual Visit via Video Note  I connected with Shelly Wright on 10/01/18 at  3:45 PM EDT by a video enabled telemedicine application and verified that I am speaking with the correct person using two identifiers.   I discussed the limitations of evaluation and management by telemedicine and the availability of in person appointments. The patient expressed understanding and agreed to proceed.    I discussed the assessment and treatment plan with the patient. The patient was provided an opportunity to ask questions and all were answered. The patient agreed with the plan and demonstrated an understanding of the instructions.   The patient was advised to call back or seek an in-person evaluation if the symptoms worsen or if the condition fails to improve as anticipated.   BH MD OP Progress Note  10/01/2018 4:13 PM Shelly Wright  MRN:  [id number]  Chief Complaint:  Chief Complaint    Follow-up     HPI: Shelly Wright is a 48 year old Caucasian female, separated, on SSI, lives in [place], has a history of bipolar disorder, anxiety disorder, asthma, chronic pain, COPD was evaluated by telemedicine today.  Patient today reports she is currently in [place] and attending a friend's mother's funeral.  She reports she hence is kind of depressed about the situation.  She however reports the Abilify is very effective for her mood in general.  She is currently on the higher dosage which is helpful.  She denies any side effects to the medication and wants to stay on that dosage at this time.  She denies any significant restlessness or racing thoughts at this time.  She reports she continues to sleep well on the trazodone.  She reports she takes 200 mg.  She reports she continues to be compliant with her venlafaxine and takes it daily.  She reports her anxiety symptoms are more under control.  She reports she went to the therapist for an initial visit however decided not to go back at this time since she  feels she is not a therapy person.  She reports she has been coping with her sisters loss okay so far.  Patient denies any suicidality, homicidality or perceptual disturbances.  She reports she has some swelling of her lower extremities and is currently on antibiotic for the same.  She has upcoming appointment with her primary medical doctor soon.  Pt continues to smoke cigarettes and is not ready to quit.  She denies any other concerns today.   Visit Diagnosis:    ICD-10-CM   1. Bipolar I disorder, most recent episode depressed (HCC) F31.30 venlafaxine XR (EFFEXOR-XR) 75 MG 24 hr capsule    traZODone (DESYREL) 100 MG tablet  2. Bereavement Z63.4   3. GAD (generalized anxiety disorder) F41.1 venlafaxine XR (EFFEXOR-XR) 75 MG 24 hr capsule    traZODone (DESYREL) 100 MG tablet  4. Cannabis use disorder, moderate, dependence (HCC) F12.20   5. Tobacco use disorder F17.200     Past Psychiatric History: Reviewed past psychiatric history from my progress note on 09/16/2018.  Past trials of Effexor, trazodone, hydroxyzine  Past Medical History:  Past Medical History:  Diagnosis Date  . Acute right-sided low back pain with right-sided sciatica 08/30/2014  . Anxiety   . Arthritis   . Asthma    in past, no current inhalers  . Bipolar disorder (HCC)   . Cervical spinal cord compression (HCC) 05/07/2012  . DDD (degenerative disc disease), lumbar   . Depression   . Fibromyalgia   . Hypertension   .  Neuritis or radiculitis due to rupture of lumbar intervertebral disc 05/23/2014  . Periprosthetic fracture around prosthetic knee 07/31/2015  . Restless leg syndrome   . Soft tissue lesion of shoulder region 01/24/2012  . 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 Syncope and collapse 04/08/2014  . Thoracic and lumbosacral neuritis 05/07/2012  . Thoracic neuritis 05/07/2012    Past Surgical History:  Procedure Laterality  Date  . ABDOMINAL HYSTERECTOMY    . Cervical Fusion X 2    . JOINT REPLACEMENT Right 01/2016   Dr Gavin Potters  . KNEE ARTHROSCOPY Bilateral    Right knee scope 1998, Left knee scope  . TONSILLECTOMY    . TOTAL KNEE ARTHROPLASTY Right 02/01/2015   Procedure: TOTAL KNEE ARTHROPLASTY;  Surgeon: Erin Sons, MD;  Location: [facility] ORS;  Service: Orthopedics;  Laterality: Right;  . TUBAL LIGATION      Family Psychiatric History: Reviewed family psychiatric history from my progress note on 09/16/2018.  Family History:  Family History  Problem Relation Age of Onset  . Breast cancer Mother   . Colon cancer Father   . Heart attack Father   . Bipolar disorder Sister   . Depression Sister   . Schizophrenia Sister     Social History: Reviewed social history from my progress note on 09/16/2018 Social History   Socioeconomic History  . Marital status: Legally Separated    Spouse name: Not on file  . Number of children: 1  . Years of education: Not on file  . Highest education level: High school graduate  Occupational History  . Not on file  Social Needs  . Financial resource strain: Not hard at all  . Food insecurity:    Worry: Never true    Inability: Never true  . Transportation needs:    Medical: No    Non-medical: No  Tobacco Use  . Smoking status: Current Every Day Smoker    Packs/day: 1.00    Types: Cigarettes    Last attempt to quit: 01/18/2013    Years since quitting: 5.7  . Smokeless tobacco: Never Used  Substance and Sexual Activity  . Alcohol use: No  . Drug use: Yes    Types: Marijuana    Comment: every day  . Sexual activity: Yes  Lifestyle  . Physical activity:    Days per week: 0 days    Minutes per session: 0 min  . Stress: To some extent  Relationships  . Social connections:    Talks on phone: Not on file    Gets together: Not on file    Attends religious service: Never    Active member of club or organization: No    Attends meetings of clubs or  organizations: Never    Relationship status: Separated  Other Topics Concern  . Not on file  Social History Narrative  . Not on file    Allergies: No Known Allergies  Metabolic Disorder Labs: No results found for: HGBA1C, MPG No results found for: PROLACTIN No results found for: CHOL, TRIG, HDL, CHOLHDL, VLDL, LDLCALC No results found for: TSH  Therapeutic Level Labs: No results found for: LITHIUM No results found for: VALPROATE No components found for:  CBMZ  Current Medications: Current Outpatient Medications  Medication Sig Dispense Refill  . cephALEXin (KEFLEX) 500 MG capsule Take by mouth.    Marland Kitchen albuterol (VENTOLIN HFA) 108 (90 Base) MCG/ACT inhaler TAKE 2 PUFFS INTO LUNGS  EVERY 6 HOURS ASNEEDED FOR WHEEZING    . ARIPiprazole (ABILIFY) 15 MG tablet Take 1 tablet (15 mg total) by mouth daily. For mood 90 tablet 0  . diclofenac (VOLTAREN) 75 MG EC tablet Take 1 tablet (75 mg total) by mouth 2 (two) times daily. 60 tablet 5  . gabapentin (NEURONTIN) 800 MG tablet Take 1 tablet (800 mg total) by mouth every 6 (six) hours. 120 tablet 5  . hydrochlorothiazide (HYDRODIURIL) 25 MG tablet Take 25 mg by mouth daily.   1  . hydrOXYzine (ATARAX/VISTARIL) 50 MG tablet Take 50 mg by mouth 3 (three) times daily.    Marland Kitchen lidocaine (XYLOCAINE) 5 % ointment Apply 1 application topically 4 (four) times daily as needed for moderate pain. 35.44 g 2  . methocarbamol (ROBAXIN) 750 MG tablet Take 1 tablet (750 mg total) by mouth 4 (four) times daily. 120 tablet 2  . simvastatin (ZOCOR) 20 MG tablet Take 20 mg by mouth daily at 6 PM.    . tiotropium (SPIRIVA HANDIHALER) 18 MCG inhalation capsule Place 18 mcg into inhaler and inhale daily.    . traZODone (DESYREL) 100 MG tablet Take 2 tablets (200 mg total) by mouth at bedtime as needed for sleep. 180 tablet 0  . venlafaxine XR (EFFEXOR-XR) 75 MG 24 hr capsule Take 3 capsules (225 mg total) by mouth every morning. 270 capsule 0   No current  facility-administered medications for this visit.      Musculoskeletal: Strength & Muscle Tone: UTA Gait & Station: UTA Patient leans: N/A  Psychiatric Specialty Exam: Review of Systems  Psychiatric/Behavioral: Positive for depression.  All other systems reviewed and are negative.   There were no vitals taken for this visit.There is no height or weight on file to calculate BMI.  General Appearance: Casual  Eye Contact:  Fair  Speech:  Clear and Coherent  Volume:  Normal  Mood:  Depressed  Affect:  Appropriate  Thought Process:  Goal Directed and Descriptions of Associations: Intact  Orientation:  Full (Time, Place, and Person)  Thought Content: Logical   Suicidal Thoughts:  No  Homicidal Thoughts:  No  Memory:  Immediate;   Fair Recent;   Fair Remote;   Fair  Judgement:  Fair  Insight:  Fair  Psychomotor Activity:  Normal  Concentration:  Concentration: Fair and Attention Span: Fair  Recall:  [employer] of Knowledge: Fair  Language: Fair  Akathisia:  No  Handed:  Right  AIMS (if indicated): Denies tremors, rigidity, stiffness  Assets:  Communication Skills Desire for Improvement Housing Social Support  ADL's:  Intact  Cognition: WNL  Sleep:  Fair   Screenings: PHQ2-9     Clinical Support from 08/26/2017 in [facility] Office Visit from 09/11/2016 in [facility] Procedure visit from 08[facility]14/2016 in [facility] Office Visit from 07/09/2016 in [facility] Office Visit from 05/06/2016 in [facility]  PHQ-2 Total Score  0  0  0  0  0       Assessment and Plan: Liana is a 48 year old patient female on SSI, lives in [place], has a history of bipolar disorder, anxiety disorder, tobacco use disorder, asthma, COPD, chronic pain was evaluated by telemedicine  today.  Patient is biologically predisposed given her family history of mental health problems, history of trauma, history of chronic pain.  Patient  with psychosocial stressors of health issues as well as the death of her sister recently, separation from her husband, financial stressors and the COVID-19 outbreak.  Patient is currently making progress on the current medication regimen.  Plan as noted below.  Plan Bipolar disorder-Improving Abilify 15 mg p.o. daily Venlafaxine 225 mg p.o. daily Trazodone 200 mg p.o. nightly for sleep.  For GAD-improving Venlafaxine as prescribed.   Patient referred for CBT.  For bereavement-improving Reports she is not interested in CBT-had one session.  She reports she will reach out as needed.  For cannabis use disorder-unstable Provided substance abuse counseling.  For tobacco use disorder-unstable Provided smoking cessation counseling.  Follow-up in clinic in 2 months or sooner if needed.  Appointment scheduled for July 8 at 2:30 PM  I have spent atleast 15 minutes non face to face with patient today. More than 50 % of the time was spent for psychoeducation and supportive psychotherapy and care coordination.  This note was generated in part or whole with voice recognition software. Voice recognition is usually quite accurate but there are transcription errors that can and very often do occur. I apologize for any typographical errors that were not detected and corrected.       Jomarie Longs, MD 10/01/2018, 4:13 PM

## 2018-10-05 ENCOUNTER — Ambulatory Visit
Admission: RE | Admit: 2018-10-05 | Discharge: 2018-10-05 | Disposition: A | Discharge status: Home / Self Care | Payer: Medicaid Other | Source: Ambulatory Visit | Attending: Physician Assistant | Admitting: Physician Assistant

## 2018-10-05 ENCOUNTER — Other Ambulatory Visit: Payer: Self-pay

## 2018-10-05 ENCOUNTER — Other Ambulatory Visit: Payer: Self-pay | Admitting: Physician Assistant

## 2018-10-05 DIAGNOSIS — M7989 Other specified soft tissue disorders: Secondary | ICD-10-CM

## 2018-10-05 DIAGNOSIS — M79662 Pain in left lower leg: Secondary | ICD-10-CM

## 2018-10-05 DIAGNOSIS — M79605 Pain in left leg: Secondary | ICD-10-CM

## 2018-10-15 NOTE — Progress Notes (Signed)
[facility]  MRN: [id number] NAME: Valentino SaxonValerie D Reif Date: 09/29/18   Total time: 1 hour  Type of Contact:  virutal Patient consent obtained:  yes Reason for Visit today:  establish care  Treatment History Patient recently received Inpatient Treatment:  yes  [facility]Program[facility]  [facility]  Date of discharge:   Patient currently being seen by therapist/psychiatrist:  Dr. Elna BreslowEappen Patient currently receiving the following services:    Past Psychiatric History/Hospitalization(s): Anxiety: No Bipolar Disorder: Yes Depression: No Mania: No Psychosis: No Schizophrenia: No Personality Disorder: No Hospitalization for psychiatric illness: No History of Electroconvulsive Shock Therapy: No Prior Suicide Attempts: No  Clinical Assessment:  PHQ-9 Assessments: [facility] 08/26/2017 09/11/2016 08/14/2016  Decreased Interest 0 0 0  Down, Depressed, Hopeless 0 0 0  PHQ - 2 Score 0 0 0    GAD-7 Assessments: No flowsheet data found.   Social Functioning Social maturity:  below stated age Social judgement:  below stated age  Stress Current stressors:  medication Familial stressors:  denies Sleep:  poor Appetite:  poor Coping ability:   poor Patient taking medications as prescribed:    Current medications:  Outpatient Encounter Medications as of 09/29/2018  Medication Sig  . albuterol (VENTOLIN HFA) 108 (90 Base) MCG/ACT inhaler TAKE 2 PUFFS INTO LUNGS EVERY 6 HOURS ASNEEDED FOR WHEEZING  . ARIPiprazole (ABILIFY) 15 MG tablet Take 1 tablet (15 mg total) by mouth daily. For mood  . diclofenac (VOLTAREN) 75 MG EC tablet Take 1 tablet (75 mg total) by mouth 2 (two) times daily.  Marland Kitchen. gabapentin (NEURONTIN) 800 MG tablet Take 1 tablet (800 mg total) by mouth every 6 (six) hours.  . hydrochlorothiazide (HYDRODIURIL) 25 MG tablet Take 25 mg by mouth daily.   . hydrOXYzine (ATARAX/VISTARIL) 50 MG tablet Take 50 mg by mouth 3 (three) times daily.   Marland Kitchen. lidocaine (XYLOCAINE) 5 % ointment Apply 1 application topically 4 (four) times daily as needed for moderate pain.  . methocarbamol (ROBAXIN) 750 MG tablet Take 1 tablet (750 mg total) by mouth 4 (four) times daily.  . simvastatin (ZOCOR) 20 MG tablet Take 20 mg by mouth daily at 6 PM.  . [DISCONTINUED] traZODone (DESYREL) 150 MG tablet Take 150 mg by mouth at bedtime as needed.   . [DISCONTINUED] venlafaxine XR (EFFEXOR-XR) 75 MG 24 hr capsule TAKE 3 CAPSULES BY MOUTH EVERY MORNING   No facility-administered encounter medications on file as of 09/29/2018.     Self-harm Behaviors Risk Assessment Self-harm risk factors:   Patient endorses recent thoughts of harming self:    [place] Suicide Severity Rating Scale: No flowsheet data found.  Danger to Others Risk Assessment Danger to others risk factors:   Patient endorses recent thoughts of harming others:    Dynamic Appraisal of Situational Aggression (DASA): No flowsheet data found.  Substance Use Assessment Patient recently consumed alcohol:    Alcohol Use Disorder Identification Test (AUDIT): No flowsheet data found. Patient recently used drugs:    Opioid Risk Assessment:  Patient is concerned about dependence or abuse of substances:    ASAM Multidimensional Assessment Summary:  Dimension 1:    Dimension 1 Rating:    Dimension 2:    Dimension 2 Rating:    Dimension 3:    Dimension 3 Rating:    Dimension 4:    Dimension 4 Rating:    Dimension 5:    Dimension 5 Rating:    Dimension 6:    Dimension 6 Rating:  ASAM's Severity Rating Score:   ASAM Recommended Level of Treatment:     Goals, Interventions and Follow-up Plan Goals: Increase healthy adjustment to current life circumstances and Increase adequate support systems for patient/family Interventions: Motivational Interviewing and Brief CBT Follow-up Plan: Refer to [facility]patient Therapy  Summary of Clinical Assessment Summary: Devone is a 48 year old Caucasian  female, separated, on SSI, lives in [place]. Patient today reports that she has been struggling with mood problems since the past several years.   She is currently on medications like Abilify, venlafaxine, trazodone and hydroxyzine.  She however continues to struggle with mood swings.  She describes episodes of feeling high energy, restlessness, racing thoughts, sleep problems, talking too fast, spending money that she does not have and other times when she is depressed and feels sad, have crying spells, is socially withdrawn, lack of motivation and so on.   Marinda Elk, LCSW

## 2018-10-23 ENCOUNTER — Encounter: Payer: Self-pay | Admitting: Pain Medicine

## 2018-10-25 NOTE — Progress Notes (Signed)
Pain Management Virtual Encounter Note - Virtual Visit via Telephone Telehealth (real-time audio visits between healthcare provider and patient).   Patient's Phone No. & Preferred Pharmacy:  [phone] (home); There is no such number on file (mobile).; (Preferred) [phone] [email or web address]  [facility] [place] [address] [place]er[address] [place] [address] Phone: [phone] Fax: [phone]    Pre-screening note:  Our staff contacted Shelly Wright and offered her an "in person", "face-to-face" appointment versus a telephone encounter. She indicated preferring the telephone encounter, at this time.   Reason for Virtual Visit: COVID-19*  Social distancing based on CDC and AMA recommendations.   I contacted Shelly SaxonValerie D Wright on 10/26/2018 at 9:17 AM via telephone.      I clearly identified myself as Oswaldo DoneFrancisco A Anayia Eugene, MD. I verified that I was speaking with the correct person using two identifiers (Name: Shelly Wright, and date of birth: 1970-08-03).  Advanced Informed Consent I sought verbal advanced consent from Shelly Wright for virtual visit interactions. I informed Ms. Wright of possible security and privacy concerns, risks, and limitations associated with providing "not-in-person" medical evaluation and management services. I also informed Ms. Wright of the availability of "in-person" appointments. Finally, I informed her that there would be a charge for the virtual visit and that she could be  personally, fully or partially, financially responsible for it. Ms. Wright expressed understanding and agreed to proceed.   Historic Elements   [name] is a 48 y.o. year old, female patient evaluated today after her last encounter by our practice on 09/16/2018. 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 She also  has a past surgical history that includes Tonsillectomy; Knee arthroscopy (Bilateral); Cervical Fusion X 2; Abdominal hysterectomy; Tubal ligation; Total knee arthroplasty (Right, 02/01/2015); and Joint replacement (Right, 01/2016). Shelly Wright has a current medication list which includes the following prescription(s): albuterol, aripiprazole, diclofenac, gabapentin, hydrochlorothiazide, hydroxyzine, lidocaine, methocarbamol, simvastatin, trazodone, venlafaxine xr, and tiotropium. She  reports that she has been smoking cigarettes. She has been smoking about 1.00 pack per day. She has never used smokeless tobacco. She reports current drug use. Drug: Marijuana. She reports that she does not drink alcohol. Shelly Wright has No Known Allergies.   HPI  Today, she is being contacted for medication management.  The patient indicates doing well with her medications.  She again attempted today to have me write for her oxycodone but I reminded her that the reason why we did not take over this medication was because we had found her 05/08/2016 UDS to be positive for unreported THC.  Since it had been a couple years since that happened, I asked the patient if she was still using marijuana and she indicated that she is still smoking.  Because of this, I have explained to her that I cannot write for any opioid analgesics as long as she is using any illicit substances.  I asked if she would be  willing to stop so that we can prescribe for her, but she indicated that she was not interested in doing that at this time.  In view of this, we will continue her pharmacotherapy with noncontrolled substances.  Today I have provided her with refills for the next 6 months.  I have also informed the patient that we are currently on phase 1 and that we can start doing elective procedures but she indicated that she did not need any at this time.  Pharmacotherapy Assessment  Analgesic: Oxycodone-Acetaminophen 5-325 1 tab PO q 6 hrs (20 mg/day oxycodone) written by Glenice BowVickie Ann Fowler, MD, last filled on 10/14/2018 (#20) MME/day: 30 mg/day.   Monitoring:  [place] PMP: PDMP reviewed during this encounter.         Pertinent Labs   SAFETY SCREENING Profile Lab Results  Component Value Date   STAPHAUREUS POSITIVE (A) 01/24/2015   MRSAPCR NEGATIVE 01/24/2015   Renal Function Lab Results  Component Value Date   BUN 17 05/06/2016   CREATININE 0.96 05/06/2016   GFRAA >60 05/06/2016   GFRNONAA >60 05/06/2016   Hepatic Function Lab Results  Component Value Date   AST 23 05/06/2016   ALT 27 05/06/2016   ALBUMIN 4.5 05/06/2016   UDS Summary  Date Value Ref Range Status  05/08/2016 FINAL  Final    Comment:    ==================================================================== TOXASSURE COMP DRUG ANALYSIS,UR ==================================================================== Test                             Result       Flag       Units Drug Present and Declared for Prescription Verification   Gabapentin                     PRESENT      EXPECTED   Trazodone                      PRESENT      EXPECTED   1,3 chlorophenyl piperazine    PRESENT      EXPECTED    1,3-chlorophenyl piperazine is an expected metabolite of    trazodone.   Venlafaxine                    PRESENT      EXPECTED   Desmethylvenlafaxine           PRESENT      EXPECTED    Desmethylvenlafaxine is an expected metabolite of  venlafaxine.   Aripiprazole                   PRESENT      EXPECTED   Diclofenac                     PRESENT      EXPECTED   Hydroxyzine                    PRESENT      EXPECTED Drug Present not Declared for Prescription Verification   Carboxy-THC                    277          UNEXPECTED ng/mg creat    Carboxy-THC is a metabolite of tetrahydrocannabinol  (THC).    [facility] is most commonly illicit, but THC is also present    in  a scheduled prescription medication. Drug Absent but Declared for Prescription Verification   Oxycodone                      Not Detected UNEXPECTED ng/mg creat   Tizanidine                     Not Detected UNEXPECTED    Tizanidine, as indicated in the declared medication list, is not    always detected even when used as directed. ==================================================================== Test                      Result    Flag   Units      Ref Range   Creatinine              84               mg/dL      >=16>=20 ==================================================================== Declared Medications:  The flagging and interpretation on this report are based on the  following declared medications.  Unexpected results [place] arise from  inaccuracies in the declared medications.  **Note: The testing scope of this panel includes these medications:  Aripiprazole (Abilify)  Gabapentin  Hydroxyzine  Oxycodone (Roxicodone)  Trazodone (Desyrel)  Venlafaxine (Effexor)  **Note: The testing scope of this panel does not include small to  moderate amounts of these reported medications:  Diclofenac (Voltaren)  Tizanidine (Zanaflex)  **Note: The testing scope of this panel does not include following  reported medications:  Cholecalciferol  Enoxaparin (Lovenox)  Hydrochlorothiazide  Hydrochlorothiazide (Microzide)  Simvastatin (Zocor) ==================================================================== For clinical consultation, please call [phone]  [phone]. ====================================================================    Note: Above Lab results reviewed.  Recent imaging  DG Chest 2 View CLINICAL DATA:  Pain and swelling in the legs, initial encounter  EXAM: CHEST - 2 VIEW  COMPARISON:  12/19/2008  FINDINGS: Cardiac shadow is within normal limits. Minimal basilar atelectatic changes are noted bilaterally. Postsurgical changes in the cervical spine are seen. No sizable effusion is noted. No other bony abnormality is noted.  IMPRESSION: Minimal bibasilar atelectasis.  Electronically Signed   By: Alcide CleverMark  Lukens M.D.   On: 10/05/2018 15:11 US Venous Img Lower Unilateral Left CLINICAL DATA:  Left lower extremity pain and swelling for 1 week  EXAM: LEFT LOWER EXTREMITY VENOUS DUPLEX ULTRASOUND  TECHNIQUE: Doppler venous assessment of the left lower extremity deep venous system was performed, including characterization of spectral flow, compressibility, and phasicity.  COMPARISON:  None.  FINDINGS: There is complete compressibility of the left common femoral, femoral, and popliteal veins. Doppler analysis demonstrates respiratory phasicity and augmentation of flow with calf compression. No obvious superficial vein or calf vein thrombosis.  There is a complex solid and cystic fluid collection in the left popliteal fossa measuring 4.3 x 6.4 x 4.3 cm. This is associated with subcutaneous fluid tracking from the popliteal fossa into the calf within the subcutaneous space.  IMPRESSION: No evidence of left lower extremity DVT.  Large complex solid and cystic mass in the popliteal fossa associated with fluid tracking down the subcutaneous tissues of the calf. These findings most likely represents a complex ruptured Baker's cyst. It is nonspecific and other considerations such as hematoma, inflammatory process and less likely malignancy are included in the differential diagnosis. Correlate clinically as for the  need for MRI.  Electronically Signed   By: Jolaine ClickArthur  Hoss M.D.   On: 10/05/2018 15:00  Assessment  The primary encounter diagnosis was  Chronic pain syndrome. Diagnoses of Chronic hip pain (Primary Area of Pain) (Bilateral) (L>R), Chronic sacroiliac joint pain (Primary Source of Pain) (Bilateral) (L>R), Chronic low back pain (Secondary area of Pain) (Bilateral) (L>R), Chronic shoulder pain (Third area of Pain) (Bilateral) (L>R), Chronic musculoskeletal pain, Primary osteoarthritis involving multiple joints, Neurogenic pain, and Fibromyalgia were also pertinent to this visit.  Plan of Care  I have changed Shelly Wright methocarbamol and lidocaine. I am also having her maintain her hydrOXYzine, simvastatin, hydrochlorothiazide, albuterol, tiotropium, ARIPiprazole, venlafaxine XR, traZODone, diclofenac, and gabapentin.  Pharmacotherapy (Medications Ordered): Meds ordered this encounter  Medications  . methocarbamol (ROBAXIN) 750 MG tablet    Sig: Take 1 tablet (750 mg total) by mouth 3 (three) times daily. Max: 3/day    Dispense:  90 tablet    Refill:  5    Fill one day early if pharmacy is closed on scheduled refill date. [place] substitute for generic if available.  . diclofenac (VOLTAREN) 75 MG EC tablet    Sig: Take 1 tablet (75 mg total) by mouth 2 (two) times daily.    Dispense:  60 tablet    Refill:  5    Fill one day early if pharmacy is closed on scheduled refill date. [place] substitute for generic if available.  . gabapentin (NEURONTIN) 800 MG tablet    Sig: Take 1 tablet (800 mg total) by mouth every 6 (six) hours.    Dispense:  120 tablet    Refill:  5    Fill one day early if pharmacy is closed on scheduled refill date. [place] substitute for generic if available.  . lidocaine (XYLOCAINE) 5 % ointment    Sig: Apply 1 application topically 4 (four) times daily as needed for moderate pain. (1application= 5 g, or 6 inches of ointment) Max: 20 g/day    Dispense:  50 g    Refill:  PRN     Fill one day early if pharmacy is closed on scheduled refill date. [place] substitute for generic if available.   Orders:  No orders of the defined types were placed in this encounter.  Follow-up plan:   Return in about 6 months (around 04/27/2019) for Med-Mgmt, (Virtual Visit).    I discussed the assessment and treatment plan with the patient. The patient was provided an opportunity to ask questions and all were answered. The patient agreed with the plan and demonstrated an understanding of the instructions.  Patient advised to call back or seek an in-person evaluation if the symptoms or condition worsens.  Total duration of non-face-to-face encounter: 12 minutes.  Note by: Gaspar Cola, MD Date: 10/26/2018; Time: 9:17 AM  Note: This dictation was prepared with Dragon dictation. Any transcriptional errors that [place] result from this process are unintentional.  Disclaimer:  * Given the special circumstances of the COVID-19 pandemic, the federal government has announced that the Office for Civil Rights (OCR) will exercise its enforcement discretion and will not impose penalties on physicians using telehealth in the event of noncompliance with regulatory requirements under the [place] (HIPAA) in connection with the good faith provision of telehealth during the XHBZJ-69 national public health emergency. ([place])

## 2018-10-26 ENCOUNTER — Ambulatory Visit: Payer: Medicaid Other | Attending: Nurse Practitioner | Admitting: Pain Medicine

## 2018-10-26 ENCOUNTER — Other Ambulatory Visit: Payer: Self-pay

## 2018-10-26 DIAGNOSIS — M159 Polyosteoarthritis, unspecified: Secondary | ICD-10-CM

## 2018-10-26 DIAGNOSIS — M7918 Myalgia, other site: Secondary | ICD-10-CM

## 2018-10-26 DIAGNOSIS — M545 Low back pain: Secondary | ICD-10-CM | POA: Diagnosis not present

## 2018-10-26 DIAGNOSIS — M25559 Pain in unspecified hip: Secondary | ICD-10-CM

## 2018-10-26 DIAGNOSIS — M150 Primary generalized (osteo)arthritis: Secondary | ICD-10-CM

## 2018-10-26 DIAGNOSIS — G894 Chronic pain syndrome: Secondary | ICD-10-CM | POA: Diagnosis not present

## 2018-10-26 DIAGNOSIS — M533 Sacrococcygeal disorders, not elsewhere classified: Secondary | ICD-10-CM | POA: Diagnosis not present

## 2018-10-26 DIAGNOSIS — M25511 Pain in right shoulder: Secondary | ICD-10-CM

## 2018-10-26 DIAGNOSIS — M25512 Pain in left shoulder: Secondary | ICD-10-CM

## 2018-10-26 DIAGNOSIS — M792 Neuralgia and neuritis, unspecified: Secondary | ICD-10-CM

## 2018-10-26 DIAGNOSIS — G8929 Other chronic pain: Secondary | ICD-10-CM

## 2018-10-26 DIAGNOSIS — M797 Fibromyalgia: Secondary | ICD-10-CM

## 2018-10-26 MED ORDER — DICLOFENAC SODIUM 75 MG PO TBEC: 75.0000 mg | DELAYED_RELEASE_TABLET | Freq: Two times a day (BID) | ORAL | 5 refills | Status: DC

## 2018-10-26 MED ORDER — METHOCARBAMOL 750 MG PO TABS: 750.0000 mg | ORAL_TABLET | Freq: Three times a day (TID) | ORAL | 5 refills | Status: DC

## 2018-10-26 MED ORDER — GABAPENTIN 800 MG PO TABS: 800.0000 mg | ORAL_TABLET | Freq: Four times a day (QID) | ORAL | 5 refills | Status: DC

## 2018-10-26 MED ORDER — LIDOCAINE 5 % EX OINT: 1.0000 "application " | TOPICAL_OINTMENT | Freq: Four times a day (QID) | CUTANEOUS | 99 refills | Status: DC | PRN

## 2018-10-27 ENCOUNTER — Encounter: Payer: Medicaid Other | Admitting: Nurse Practitioner

## 2018-11-18 ENCOUNTER — Other Ambulatory Visit: Payer: Self-pay | Admitting: Psychiatry

## 2018-11-18 DIAGNOSIS — F3130 Bipolar disorder, current episode depressed, mild or moderate severity, unspecified: Secondary | ICD-10-CM

## 2018-11-25 ENCOUNTER — Other Ambulatory Visit: Payer: Self-pay

## 2018-11-25 ENCOUNTER — Encounter: Payer: Self-pay | Admitting: Psychiatry

## 2018-11-25 ENCOUNTER — Ambulatory Visit (INDEPENDENT_AMBULATORY_CARE_PROVIDER_SITE_OTHER): Payer: Medicaid Other | Admitting: Psychiatry

## 2018-11-25 DIAGNOSIS — F17200 Nicotine dependence, unspecified, uncomplicated: Secondary | ICD-10-CM | POA: Insufficient documentation

## 2018-11-25 DIAGNOSIS — F1220 Cannabis dependence, uncomplicated: Secondary | ICD-10-CM

## 2018-11-25 DIAGNOSIS — F3130 Bipolar disorder, current episode depressed, mild or moderate severity, unspecified: Secondary | ICD-10-CM

## 2018-11-25 DIAGNOSIS — F410 Panic disorder [episodic paroxysmal anxiety] without agoraphobia: Secondary | ICD-10-CM

## 2018-11-25 DIAGNOSIS — F411 Generalized anxiety disorder: Secondary | ICD-10-CM

## 2018-11-25 MED ORDER — VENLAFAXINE HCL ER 150 MG PO CP24: 300.0000 mg | ORAL_CAPSULE | Freq: Every day | ORAL | 0 refills | Status: DC

## 2018-11-25 MED ORDER — PROPRANOLOL HCL 10 MG PO TABS: 10.0000 mg | ORAL_TABLET | Freq: Three times a day (TID) | ORAL | 1 refills | Status: DC | PRN

## 2018-11-25 NOTE — Progress Notes (Signed)
Virtual Visit via Video Note  I connected with Shelly Wright on 11/25/18 at  2:30 PM EDT by a video enabled telemedicine application and verified that I am speaking with the correct person using two identifiers.   I discussed the limitations of evaluation and management by telemedicine and the availability of in person appointments. The patient expressed understanding and agreed to proceed.    I discussed the assessment and treatment plan with the patient. The patient was provided an opportunity to ask questions and all were answered. The patient agreed with the plan and demonstrated an understanding of the instructions.   The patient was advised to call back or seek an in-person evaluation if the symptoms worsen or if the condition fails to improve as anticipated.   BH MD OP Progress Note  11/25/2018 5:50 PM Shelly Wright  MRN:  [id number]  Chief Complaint:  Chief Complaint    Follow-up     HPI: Shelly Wright is a 48 year old Caucasian female, separated, on SSI, [place] in [place] history of bipolar disorder, GAD, cannabis use disorder, panic attacks, tobacco use disorder was evaluated problems like asthma, chronic pain and COPD.  Patient today reports she has been having some panic attacks recently.  She reports she is currently going through psychosocial stressors of being worried about her nephew who stays with her.  Her nephew's parents both passed away last year.  She reports she is worried about him being in the wrong crowd and also given the COVID-19 situation him coming and going the way he is.  Patient became tearful when she discussed this.  Patient continues to cannabis reports she smokes up to 3 blunts per day.  She reports sleep is good.  She is compliant on her venlafaxine.  Discussed readjusting her venlafaxine dosage .  Also discussed adding propranolol since hydroxyzine 50 mg is not effective anymore.  Discussed with patient that she can be referred for psychotherapy  sessions.  She agrees with plan. Visit Diagnosis:    ICD-10-CM   1. Bipolar I disorder, most recent episode depressed (HCC)  F31.30   2. GAD (generalized anxiety disorder)  F41.1 venlafaxine XR (EFFEXOR-XR) 150 MG 24 hr capsule  3. Cannabis use disorder, moderate, dependence (HCC)  F12.20   4. Tobacco use disorder  F17.200   5. Panic attacks  F41.0 venlafaxine XR (EFFEXOR-XR) 150 MG 24 hr capsule    propranolol (INDERAL) 10 MG tablet    Past Psychiatric History: I have reviewed past psychiatric history from my progress note on 09/16/2018.  Past trials of Effexor, trazodone, hydroxyzine.  Past Medical History:  Past Medical History:  Diagnosis Date  . Acute right-sided low back pain with right-sided sciatica 08/30/2014  . Anxiety   . Arthritis   . Asthma    in past, no current inhalers  . Bipolar disorder (HCC)   . Cervical spinal cord compression (HCC) 05/07/2012  . DDD (degenerative disc disease), lumbar   . Depression   . Fibromyalgia   . Hypertension   . Neuritis or radiculitis due to rupture of lumbar intervertebral disc 05/23/2014  . Periprosthetic fracture around prosthetic knee 07/31/2015  . Restless leg syndrome   . Soft tissue lesion of shoulder region 01/24/2012  . 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 Syncope and collapse 04/08/2014  . Thoracic and lumbosacral neuritis 05/07/2012  . Thoracic neuritis 05/07/2012    Past Surgical History:  Procedure Laterality Date  .  ABDOMINAL HYSTERECTOMY    . Cervical Fusion X 2    . JOINT REPLACEMENT Right 01/2016   Dr Gavin PottersKernodle  . KNEE ARTHROSCOPY Bilateral    Right knee scope 1998, Left knee scope  . TONSILLECTOMY    . TOTAL KNEE ARTHROPLASTY Right 02/01/2015   Procedure: TOTAL KNEE ARTHROPLASTY;  Surgeon: Erin SonsHarold Kernodle, MD;  Location: [facility] ORS;  Service: Orthopedics;  Laterality: Right;  . TUBAL LIGATION      Family Psychiatric History: Reviewed  family psychiatric history from my progress note on 09/16/2018.  Family History:  Family History  Problem Relation Age of Onset  . Breast cancer Mother   . Colon cancer Father   . Heart attack Father   . Bipolar disorder Sister   . Depression Sister   . Schizophrenia Sister     Social History: I have reviewed social history from my progress note on 09/16/2018. Social History   Socioeconomic History  . Marital status: Legally Separated    Spouse name: Not on file  . Number of children: 1  . Years of education: Not on file  . Highest education level: High school graduate  Occupational History  . Not on file  Social Needs  . Financial resource strain: Not hard at all  . Food insecurity    Worry: Never true    Inability: Never true  . Transportation needs    Medical: No    Non-medical: No  Tobacco Use  . Smoking status: Current Every Day Smoker    Packs/day: 1.00    Types: Cigarettes    Last attempt to quit: 01/18/2013    Years since quitting: 5.8  . Smokeless tobacco: Never Used  Substance and Sexual Activity  . Alcohol use: No  . Drug use: Yes    Types: Marijuana    Comment: every day  . Sexual activity: Yes  Lifestyle  . Physical activity    Days per week: 0 days    Minutes per session: 0 min  . Stress: To some extent  Relationships  . Social Musicianconnections    Talks on phone: Not on file    Gets together: Not on file    Attends religious service: Never    Active member of club or organization: No    Attends meetings of clubs or organizations: Never    Relationship status: Separated  Other Topics Concern  . Not on file  Social History Narrative  . Not on file    Allergies: No Known Allergies  Metabolic Disorder Labs: No results found for: HGBA1C, MPG No results found for: PROLACTIN No results found for: CHOL, TRIG, HDL, CHOLHDL, VLDL, LDLCALC No results found for: TSH  Therapeutic Level Labs: No results found for: LITHIUM No results found for:  VALPROATE No components found for:  CBMZ  Current Medications: Current Outpatient Medications  Medication Sig Dispense Refill  . albuterol (VENTOLIN HFA) 108 (90 Base) MCG/ACT inhaler TAKE 2 PUFFS INTO LUNGS EVERY 6 HOURS ASNEEDED FOR WHEEZING    . ARIPiprazole (ABILIFY) 15 MG tablet TAKE ONE TABLET EVERY DAY 90 tablet 0  . diclofenac (VOLTAREN) 75 MG EC tablet Take 1 tablet (75 mg total) by mouth 2 (two) times daily. 60 tablet 5  . gabapentin (NEURONTIN) 800 MG tablet Take 1 tablet (800 mg total) by mouth every 6 (six) hours. 120 tablet 5  . hydrochlorothiazide (HYDRODIURIL) 25 MG tablet Take 25 mg by mouth daily.   1  . lidocaine (XYLOCAINE) 5 % ointment Apply 1 application  topically 4 (four) times daily as needed for moderate pain. (1application= 5 g, or 6 inches of ointment) Max: 20 g/day 50 g PRN  . methocarbamol (ROBAXIN) 750 MG tablet Take 1 tablet (750 mg total) by mouth 3 (three) times daily. Max: 3/day 90 tablet 5  . propranolol (INDERAL) 10 MG tablet Take 1 tablet (10 mg total) by mouth 3 (three) times daily as needed. For severe panic attacks only 90 tablet 1  . simvastatin (ZOCOR) 20 MG tablet Take 20 mg by mouth daily at 6 PM.    . tiotropium (SPIRIVA HANDIHALER) 18 MCG inhalation capsule Place 18 mcg into inhaler and inhale daily.    . traZODone (DESYREL) 100 MG tablet Take 2 tablets (200 mg total) by mouth at bedtime as needed for sleep. 180 tablet 0  . venlafaxine XR (EFFEXOR-XR) 150 MG 24 hr capsule Take 2 capsules (300 mg total) by mouth daily with breakfast. 180 capsule 0   No current facility-administered medications for this visit.      Musculoskeletal: Strength & Muscle Tone: within normal limits Gait & Station: normal Patient leans: N/A  Psychiatric Specialty Exam: Review of Systems  Psychiatric/Behavioral: The patient is nervous/anxious.   All other systems reviewed and are negative.   There were no vitals taken for this visit.There is no height or weight on  file to calculate BMI.  General Appearance: Casual  Eye Contact:  Fair  Speech:  Clear and Coherent  Volume:  Normal  Mood:  Anxious  Affect:  Appropriate  Thought Process:  Goal Directed and Descriptions of Associations: Intact  Orientation:  Full (Time, Place, and Person)  Thought Content: Logical   Suicidal Thoughts:  No  Homicidal Thoughts:  No  Memory:  Immediate;   Fair Recent;   Fair Remote;   Fair  Judgement:  Fair  Insight:  Fair  Psychomotor Activity:  Normal  Concentration:  Concentration: Fair and Attention Span: Fair  Recall:  FiservFair  Fund of Knowledge: Fair  Language: Fair  Akathisia:  No  Handed:  Right  AIMS (if indicated): denies tremors, rigidity  Assets:  Communication Skills Desire for Improvement Social Support  ADL's:  Intact  Cognition: WNL  Sleep:  Fair   Screenings: PHQ2-9     Clinical Support from [facility]26[facility]2017 [facility] Office Visit [facility]/11[facility] Procedure visit from [facility]14/2016 [facility]  PHQ-2 Total Score  0  0  0  0  0       Assessment and Plan: Shelly Wright is a 48 year old Caucasian female, on SSI, lives in [place], has a history of bipolar disorder anxiety disorder, tobacco use disorder, asthma, COPD, chronic pain was evaluated by telemedicine today.  Patient is biologically predisposed given her family history of mental health problems as well as her own substance abuse problems.  She also has a history of trauma, history of chronic pain.  Patient with psychosocial stressors of health issues as well as her struggles with her nephew having emotional problems, financial stressors, separation from husband, death of her sister  COVID-19 outbreak.  Patient struggles with panic attacks and will need occasion agement as well as psychotherapy session.  Plan Bipolar disorder- improving Abilify 15 mg p.o. daily Trazodone 200 mg p.o. nightly for sleep.  GAD- stable Increase venlafaxine to 300 mg p.o. daily  For panic attacks-unstable Venlafaxine prescribed Add propranolol 10 mg p.o. 3 times daily as needed for panic attacks Refer for CBT. Continue hydroxyzine for lack of benefit.  For bereavement-improving Refer for CBT.  For cannabis use disorder-unstable She reports smoking up to 3 blunts per day. Provided substance abuse counseling.  For tobacco use disorder-unstable Provided smoking cessation counseling, she is not ready to quit.  Follow-up in clinic 4 weeks or sooner if needed.  August 17 at 8:30 AM  I have spent atleast 25 minutes non face to face with patient today. More than 50 % of the time was spent for psychoeducation and supportive psychotherapy and care coordination.  This note was generated in part or whole with voice recognition software. Voice recognition is usually quite accurate but there are transcription errors that can and very often do occur. I apologize for any typographical errors that were not detected and corrected.        Ursula Alert, MD 11/25/2018, 5:50 PM

## 2018-12-02 ENCOUNTER — Telehealth: Payer: Self-pay

## 2018-12-07 NOTE — Telephone Encounter (Signed)
Error

## 2018-12-08 ENCOUNTER — Telehealth: Payer: Self-pay

## 2018-12-08 NOTE — Telephone Encounter (Signed)
pt was called and given instructions. pt stated that she never took the propranolol and that she didnt take it because it can cause alot of heart isses. if there anything she can do to help with her anxiety

## 2018-12-08 NOTE — Telephone Encounter (Signed)
I would not advise any medication at this time especially given her venlafaxine was uptitirated recently (although she can try hydroxyzine, it seems like she tried this before with limited benefit according to the chart)

## 2018-12-08 NOTE — Telephone Encounter (Signed)
Advise her to discontinue the propranolol. It seems like her venlafaxine was uptitrated at the last visit; hope it would become more effective for anxiety after 4-6 weeks of changing the dose.

## 2018-12-08 NOTE — Telephone Encounter (Signed)
pt called states that she having medication issues she states that she can not take the propranolol because it has a lot of heart side affects and she doesn't want to take that medication.

## 2018-12-19 ENCOUNTER — Other Ambulatory Visit: Payer: Self-pay | Admitting: Psychiatry

## 2018-12-19 DIAGNOSIS — F3130 Bipolar disorder, current episode depressed, mild or moderate severity, unspecified: Secondary | ICD-10-CM

## 2018-12-24 ENCOUNTER — Telehealth: Payer: Self-pay

## 2018-12-24 ENCOUNTER — Telehealth (HOSPITAL_COMMUNITY): Payer: Self-pay | Admitting: Psychiatry

## 2018-12-24 DIAGNOSIS — F411 Generalized anxiety disorder: Secondary | ICD-10-CM

## 2018-12-24 DIAGNOSIS — F1220 Cannabis dependence, uncomplicated: Secondary | ICD-10-CM

## 2018-12-24 DIAGNOSIS — F3130 Bipolar disorder, current episode depressed, mild or moderate severity, unspecified: Secondary | ICD-10-CM

## 2018-12-24 MED ORDER — BUSPIRONE HCL 5 MG PO TABS: 5.0000 mg | ORAL_TABLET | Freq: Two times a day (BID) | ORAL | 1 refills | Status: DC

## 2018-12-24 NOTE — Telephone Encounter (Signed)
Patient called and stated that she is still crying, panick attacks are worsening, and she stated she is having trouble breathing. She did not sound like she would need to go to emergency as I was talking to her. She sounded very calm and level headed as I was speaking to her. Please review and advise. Thank you.

## 2018-12-24 NOTE — Telephone Encounter (Signed)
D:  Dr. Shea Evans referred pt to virtual MH-IOP.  A:  Called to orient patient and provide her with a start date.  Pt declined, stating she didn't want group.  "I don't want to be in group with other people."  Reiterated to pt that everything is confidential and she is welcome to only share what she is comfortable sharing.  Pt still declined; stating she has a therapist appt on 01-06-19.  Inform Dr. Shea Evans.

## 2018-12-24 NOTE — Telephone Encounter (Signed)
Patient reports she wants something for panic attacks that will fix her panic attack today.  Patient was referred for psychotherapy sessions however reports she could not do it since she was waiting for the therapist to give her an appointment.  Discussed starting propranolol however she reports she read about all the cardiac side effects on propranolol medication information and hence decided not to take it she does not think the hydroxyzine is working as well.  She reports she is very stressed at this time since she has to take care of 4 grandchildren who are between the age of 2 and 9.  That parents work and she is having trouble taking care of these kids.  This is making her more panicky.  Discussed with patient that she needs to be in therapy and also possibly an intensive outpatient program if possible.  Discussed with her she cannot be referred to retire in [place] who will answer her questions.  Discussed adding a small dosage of BuSpar in the meantime.  BuSpar 5 mg twice a day.  She is already on a high dosage of venlafaxine.

## 2018-12-25 ENCOUNTER — Ambulatory Visit: Payer: Medicaid Other | Admitting: Licensed Clinical Social Worker

## 2018-12-29 ENCOUNTER — Other Ambulatory Visit: Payer: Self-pay | Admitting: Psychiatry

## 2018-12-29 DIAGNOSIS — F410 Panic disorder [episodic paroxysmal anxiety] without agoraphobia: Secondary | ICD-10-CM

## 2019-01-04 ENCOUNTER — Encounter: Payer: Self-pay | Admitting: Psychiatry

## 2019-01-04 ENCOUNTER — Ambulatory Visit (INDEPENDENT_AMBULATORY_CARE_PROVIDER_SITE_OTHER): Payer: Medicaid Other | Admitting: Psychiatry

## 2019-01-04 ENCOUNTER — Other Ambulatory Visit: Payer: Self-pay

## 2019-01-04 DIAGNOSIS — F1220 Cannabis dependence, uncomplicated: Secondary | ICD-10-CM

## 2019-01-04 DIAGNOSIS — F3130 Bipolar disorder, current episode depressed, mild or moderate severity, unspecified: Secondary | ICD-10-CM

## 2019-01-04 DIAGNOSIS — F17200 Nicotine dependence, unspecified, uncomplicated: Secondary | ICD-10-CM

## 2019-01-04 DIAGNOSIS — F411 Generalized anxiety disorder: Secondary | ICD-10-CM | POA: Diagnosis not present

## 2019-01-04 MED ORDER — TRAZODONE HCL 100 MG PO TABS: 200.0000 mg | ORAL_TABLET | Freq: Every evening | ORAL | 0 refills | Status: DC | PRN

## 2019-01-04 MED ORDER — BUSPIRONE HCL 10 MG PO TABS: 10.0000 mg | ORAL_TABLET | Freq: Two times a day (BID) | ORAL | 1 refills | Status: DC

## 2019-01-04 NOTE — Progress Notes (Signed)
Virtual Visit via Video Note  I connected with Shelly Wright on 01/04/19 at  8:30 AM EDT by a video enabled telemedicine application and verified that I am speaking with the correct person using two identifiers.   I discussed the limitations of evaluation and management by telemedicine and the availability of in person appointments. The patient expressed understanding and agreed to proceed.  I discussed the assessment and treatment plan with the patient. The patient was provided an opportunity to ask questions and all were answered. The patient agreed with the plan and demonstrated an understanding of the instructions.   The patient was advised to call back or seek an in-person evaluation if the symptoms worsen or if the condition fails to improve as anticipated.   BH MD OP Progress Note  01/04/2019 5:10 PM Shelly Wright  MRN:  [id number]  Chief Complaint:  Chief Complaint    Follow-up; Anxiety     HPI: Shelly Wright is a 48 year old Caucasian female, separated, on SSI, lives in [place], has a history of GAD, bipolar disorder, cannabis use disorder, panic attacks, tobacco use disorder was evaluated by telemedicine today.  Patient also has asthma, chronic pain and COPD.  Patient today reports her anxiety symptoms Graumann have improved compared to couple of weeks ago.  She continues to be compliant on venlafaxine and BuSpar.  She does have some panic attacks on and off.  She reports she however is able to cope with it better than before.  She reports sleep is good.  She reports she continues to have a good appetite.  She denies any suicidality, homicidality or perceptual disturbances.  Patient was referred for IOP however she had declined that.  Patient has upcoming appointment with Ms. Peacock and advised her to keep it.  Patient continues to smoke cigarettes, she also continues to smoke cannabis.  She reports she is not ready to quit.  Provided substance abuse counseling as well as  smoking  cessation counseling.  Patient is interested in increasing her BuSpar.  Will increase it to 10 mg twice a day today.  Advised her to monitor herself for adverse side effects.   Visit Diagnosis:    ICD-10-CM   1. Bipolar I disorder, most recent episode depressed (HCC)  F31.30 busPIRone (BUSPAR) 10 MG tablet    traZODone (DESYREL) 100 MG tablet  2. GAD (generalized anxiety disorder)  F41.1 busPIRone (BUSPAR) 10 MG tablet    traZODone (DESYREL) 100 MG tablet  3. Cannabis use disorder, moderate, dependence (HCC)  F12.20   4. Tobacco use disorder  F17.200     Past Psychiatric History: I have reviewed past psychiatric history from my progress note on 09/16/2018.  Past trials of venlafaxine, trazodone, hydroxyzine.  Past Medical History:  Past Medical History:  Diagnosis Date  . Acute right-sided low back pain with right-sided sciatica 08/30/2014  . Anxiety   . Arthritis   . Asthma    in past, no current inhalers  . Bipolar disorder (HCC)   . Cervical spinal cord compression (HCC) 05/07/2012  . DDD (degenerative disc disease), lumbar   . Depression   . Fibromyalgia   . Hypertension   . Neuritis or radiculitis due to rupture of lumbar intervertebral disc 05/23/2014  . Periprosthetic fracture around prosthetic knee 07/31/2015  . Restless leg syndrome   . Soft tissue lesion of shoulder region 01/24/2012  . 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 Syncope  and collapse 04/08/2014  . Thoracic and lumbosacral neuritis 05/07/2012  . Thoracic neuritis 05/07/2012    Past Surgical History:  Procedure Laterality Date  . ABDOMINAL HYSTERECTOMY    . Cervical Fusion X 2    . JOINT REPLACEMENT Right 01/2016   Dr Jefm Bryant  . KNEE ARTHROSCOPY Bilateral    Right knee scope 1998, Left knee scope  . TONSILLECTOMY    . TOTAL KNEE ARTHROPLASTY Right 02/01/2015   Procedure: TOTAL KNEE ARTHROPLASTY;  Surgeon: Leanor Kail, MD;   Location: [facility] ORS;  Service: Orthopedics;  Laterality: Right;  . TUBAL LIGATION      Family Psychiatric History: I have reviewed family psychiatric history from my progress note on 09/16/2018.  Family History:  Family History  Problem Relation Age of Onset  . Breast cancer Mother   . Colon cancer Father   . Heart attack Father   . Bipolar disorder Sister   . Depression Sister   . Schizophrenia Sister     Social History: I have reviewed social history from my progress note on 09/16/2018. Social History   Socioeconomic History  . Marital status: Legally Separated    Spouse name: Not on file  . Number of children: 1  . Years of education: Not on file  . Highest education level: High school graduate  Occupational History  . Not on file  Social Needs  . Financial resource strain: Not hard at all  . Food insecurity    Worry: Never true    Inability: Never true  . Transportation needs    Medical: No    Non-medical: No  Tobacco Use  . Smoking status: Current Every Day Smoker    Packs/day: 1.00    Types: Cigarettes    Last attempt to quit: 01/18/2013    Years since quitting: 5.9  . Smokeless tobacco: Never Used  Substance and Sexual Activity  . Alcohol use: No  . Drug use: Yes    Types: Marijuana    Comment: every day  . Sexual activity: Yes  Lifestyle  . Physical activity    Days per week: 0 days    Minutes per session: 0 min  . Stress: To some extent  Relationships  . Social Herbalist on phone: Not on file    Gets together: Not on file    Attends religious service: Never    Active member of club or organization: No    Attends meetings of clubs or organizations: Never    Relationship status: Separated  Other Topics Concern  . Not on file  Social History Narrative  . Not on file    Allergies: No Known Allergies  Metabolic Disorder Labs: No results found for: HGBA1C, MPG No results found for: PROLACTIN No results found for: CHOL, TRIG, HDL,  CHOLHDL, VLDL, LDLCALC No results found for: TSH  Therapeutic Level Labs: No results found for: LITHIUM No results found for: VALPROATE No components found for:  CBMZ  Current Medications: Current Outpatient Medications  Medication Sig Dispense Refill  . albuterol (VENTOLIN HFA) 108 (90 Base) MCG/ACT inhaler TAKE 2 PUFFS INTO LUNGS EVERY 6 HOURS ASNEEDED FOR WHEEZING    . ARIPiprazole (ABILIFY) 15 MG tablet TAKE ONE TABLET EVERY DAY 90 tablet 0  . busPIRone (BUSPAR) 10 MG tablet Take 1 tablet (10 mg total) by mouth 2 (two) times daily. 60 tablet 1  . diclofenac (VOLTAREN) 75 MG EC tablet Take 1 tablet (75 mg total) by mouth 2 (two) times daily.  60 tablet 5  . gabapentin (NEURONTIN) 800 MG tablet Take 1 tablet (800 mg total) by mouth every 6 (six) hours. 120 tablet 5  . hydrochlorothiazide (HYDRODIURIL) 25 MG tablet Take 25 mg by mouth daily.   1  . lidocaine (XYLOCAINE) 5 % ointment Apply 1 application topically 4 (four) times daily as needed for moderate pain. (1application= 5 g, or 6 inches of ointment) Max: 20 g/day 50 g PRN  . methocarbamol (ROBAXIN) 750 MG tablet Take 1 tablet (750 mg total) by mouth 3 (three) times daily. Max: 3/day 90 tablet 5  . propranolol (INDERAL) 10 MG tablet TAKE 1 TABLET BY MOUTH THREE TIMES DAILYAS NEEDED FOR SEVERE PANIC ATTACKS ONLY 90 tablet 1  . simvastatin (ZOCOR) 20 MG tablet Take 20 mg by mouth daily at 6 PM.    . tiotropium (SPIRIVA HANDIHALER) 18 MCG inhalation capsule Place 18 mcg into inhaler and inhale daily.    . traZODone (DESYREL) 100 MG tablet Take 2 tablets (200 mg total) by mouth at bedtime as needed for sleep. 180 tablet 0  . venlafaxine XR (EFFEXOR-XR) 150 MG 24 hr capsule Take 2 capsules (300 mg total) by mouth daily with breakfast. 180 capsule 0   No current facility-administered medications for this visit.      Musculoskeletal: Strength & Muscle Tone: UTA Gait & Station: Observed as seated Patient leans: N/A  Psychiatric  Specialty Exam: Review of Systems  Psychiatric/Behavioral: The patient is nervous/anxious.   All other systems reviewed and are negative.   There were no vitals taken for this visit.There is no height or weight on file to calculate BMI.  General Appearance: Casual  Eye Contact:  Fair  Speech:  Clear and Coherent  Volume:  Normal  Mood:  Anxious  Affect:  Congruent  Thought Process:  Goal Directed and Descriptions of Associations: Intact  Orientation:  Full (Time, Place, and Person)  Thought Content: Logical   Suicidal Thoughts:  No  Homicidal Thoughts:  No  Memory:  Immediate;   Fair Recent;   Fair Remote;   Fair  Judgement:  Fair  Insight:  Fair  Psychomotor Activity:  Normal  Concentration:  Concentration: Fair and Attention Span: Fair  Recall:  FiservFair  Fund of Knowledge: Fair  Language: Fair  Akathisia:  No  Handed:  Right  AIMS (if indicated): denies tremors, rigidity  Assets:  Communication Skills Desire for Improvement Social Support  ADL's:  Intact  Cognition: WNL  Sleep:  Fair   Screenings: PHQ2-9     Clinical Support [facility] Office Visit from 09/11/2016 [facility] Procedure visit from 08/14/2016 [facility] Office [facility]/09[facility] Office Visit [facility]06[facility]2016 [facility]  PHQ-2 Total Score  0  0  0  0  0       Assessment and Plan: Shelly Wright is a 48 year old Caucasian female, on SSI, lives in [place], has a history of bipolar disorder, anxiety disorder, tobacco use disorder, asthma, COPD, chronic pain was evaluated by telemedicine today.  Patient is biologically predisposed given her family history of mental health problems as well as her own substance abuse problems.  She also has a history of trauma,  history of chronic pain.  Patient with psychosocial stressors of health issues as well as her struggles with her nephew having emotional  problems, financial stressors, separation from husband, death of her sister, COVID-19 outbreak.  Patient is making progress on the current medication regimen however will continue to benefit from medication readjustment as well as psychotherapy sessions.  Plan Bipolar disorder-improving Abilify 15 mg p.o. daily Trazodone 200 mg p.o. nightly for sleep  For GAD-improving Venlafaxine 300 mg p.o. daily BuSpar increased to 10 mg twice a day.  For panic attacks-some progress Venlafaxine as prescribed Propranolol 10 mg p.o. 3 times daily as needed for anxiety attacks Patient was referred for IOP however she declined. Patient advised to continue psychotherapy sessions with Ms. Peacock-has upcoming appointment on 01/06/2019.   For bereavement-improving Referred for CBT  For cannabis use disorder-unstable Provided substance abuse counseling  For tobacco use disorder-unstable Provided smoking cessation counseling.  She is not ready to quit.  Follow-up in clinic in 4 weeks or sooner if needed.  September 16 at 2:45 PM  I have spent atleast 15 minutes non face to face with patient today. More than 50 % of the time was spent for psychoeducation and supportive psychotherapy and care coordination.  This note was generated in part or whole with voice recognition software. Voice recognition is usually quite accurate but there are transcription errors that can and very often do occur. I apologize for any typographical errors that were not detected and corrected.       Jomarie LongsSaramma Chou Busler, MD 01/04/2019, 5:10 PM

## 2019-01-06 ENCOUNTER — Ambulatory Visit (INDEPENDENT_AMBULATORY_CARE_PROVIDER_SITE_OTHER): Payer: Medicaid Other | Admitting: Licensed Clinical Social Worker

## 2019-01-06 ENCOUNTER — Other Ambulatory Visit: Payer: Self-pay

## 2019-01-06 DIAGNOSIS — F3130 Bipolar disorder, current episode depressed, mild or moderate severity, unspecified: Secondary | ICD-10-CM

## 2019-01-17 NOTE — Progress Notes (Signed)
Virtual Visit via Telephone Note  I connected with Shelly Wright on 01/06/19 at  1:00 PM EDT by telephone and verified that I am speaking with the correct person using two identifiers.  Location: Patient: home Provider: office   I discussed the limitations, risks, security and privacy concerns of performing an evaluation and management service by telephone and the availability of in person appointments. I also discussed with the patient that there Serena be a patient responsible charge related to this service. The patient expressed understanding and agreed to proceed.     I discussed the assessment and treatment plan with the patient. The patient was provided an opportunity to ask questions and all were answered. The patient agreed with the plan and demonstrated an understanding of the instructions.   The patient was advised to call back or seek an in-person evaluation if the symptoms worsen or if the condition fails to improve as anticipated.  I provided 30 minutes of non-face-to-face time during this encounter.   Lubertha South, LCSW  Session Time: 78min  Participation Level: Active  Type of Therapy: Individual Therapy  Treatment Goals addressed: Anxiety  Interventions: CBT and Solution Focused  Summary: Shelly Wright is a 48 y.o. female who presents with continued symptoms of diagnosis. Factors that contribute to client's ongoing depressive symptoms were discussed and include real and perceived feelings of isolation, criticism, rejection, shame and guilt.  Suicidal/Homicidal: No  Plan: Return again in2 weeks.  Diagnosis: Axis I: Bipolar, mixed    Axis II: No diagnosis    Lubertha South, LCSW 01/06/2019

## 2019-01-19 ENCOUNTER — Other Ambulatory Visit: Payer: Self-pay | Admitting: Psychiatry

## 2019-01-19 DIAGNOSIS — F3130 Bipolar disorder, current episode depressed, mild or moderate severity, unspecified: Secondary | ICD-10-CM

## 2019-01-31 NOTE — Discharge Instructions (Signed)
°  Instructions after Total Knee Replacement ° ° Bess Saltzman P. Abrham Maslowski, Jr., M.D.    ° Dept. of Orthopaedics & Sports Medicine ° [facility] ° [address] ° [place], [place]  [address] ° Phone: [phone]   Fax: [phone] ° °  °DIET: °• Drink plenty of non-alcoholic fluids. °• Resume your normal diet. Include foods high in fiber. ° °ACTIVITY:  °• You Isaac use crutches or a walker with weight-bearing as tolerated, unless instructed otherwise. °• You Shain be weaned off of the walker or crutches by your Physical Therapist.  °• Do NOT place pillows under the knee. Anything placed under the knee could limit your ability to straighten the knee.   °• Continue doing gentle exercises. Exercising will reduce the pain and swelling, increase motion, and prevent muscle weakness.   °• Please continue to use the TED compression stockings for 6 weeks. You Knepp remove the stockings at night, but should reapply them in the morning. °• Do not drive or operate any equipment until instructed. ° °WOUND CARE:  °• Continue to use the PolarCare or ice packs periodically to reduce pain and swelling. °• You Szabo bathe or shower after the staples are removed at the first office visit following surgery. ° °MEDICATIONS: °• You Haden resume your regular medications. °• Please take the pain medication as prescribed on the medication. °• Do not take pain medication on an empty stomach. °• You have been given a prescription for a blood thinner (Lovenox or Coumadin). Please take the medication as instructed. (NOTE: After completing a 2 week course of Lovenox, take one Enteric-coated aspirin once a day. This along with elevation will help reduce the possibility of phlebitis in your operated leg.) °• Do not drive or drink alcoholic beverages when taking pain medications. ° °CALL THE OFFICE FOR: °• Temperature above 101 degrees °• Excessive bleeding or drainage on the dressing. °• Excessive swelling, coldness, or paleness of the toes. °• Persistent  nausea and vomiting. ° °FOLLOW-UP:  °• You should have an appointment to return to the office in 10-14 days after surgery. °• Arrangements have been made for continuation of Physical Therapy (either home therapy or outpatient therapy). °  °

## 2019-02-02 ENCOUNTER — Encounter
Admission: RE | Admit: 2019-02-02 | Discharge: 2019-02-02 | Disposition: A | Discharge status: Home / Self Care | Payer: Medicaid Other | Source: Ambulatory Visit | Attending: Orthopedic Surgery | Admitting: Orthopedic Surgery

## 2019-02-02 ENCOUNTER — Other Ambulatory Visit: Payer: Self-pay

## 2019-02-02 DIAGNOSIS — Z01812 Encounter for preprocedural laboratory examination: Secondary | ICD-10-CM | POA: Diagnosis not present

## 2019-02-02 DIAGNOSIS — Z22322 Carrier or suspected carrier of Methicillin resistant Staphylococcus aureus: Secondary | ICD-10-CM

## 2019-02-02 HISTORY — DX: Cardiac arrhythmia, unspecified: I49.9

## 2019-02-02 HISTORY — DX: Dyspnea, unspecified: R06.00

## 2019-02-02 HISTORY — DX: Carrier or suspected carrier of methicillin resistant Staphylococcus aureus: Z22.322

## 2019-02-02 HISTORY — DX: Chronic obstructive pulmonary disease, unspecified: J44.9

## 2019-02-02 LAB — SEDIMENTATION RATE: Sed Rate: 1 mm/hr (ref 0–30)

## 2019-02-02 LAB — URINALYSIS, ROUTINE W REFLEX MICROSCOPIC
Bilirubin Urine: NEGATIVE
Glucose, UA: NEGATIVE mg/dL
Hgb urine dipstick: NEGATIVE
Ketones, ur: NEGATIVE mg/dL
Leukocytes,Ua: NEGATIVE
Nitrite: NEGATIVE
Protein, ur: NEGATIVE mg/dL
Specific Gravity, Urine: 1.013 (ref 1.005–1.030)
pH: 6 (ref 5.0–8.0)

## 2019-02-02 LAB — CBC WITH DIFFERENTIAL/PLATELET
Abs Immature Granulocytes: 0.02 10*3/uL (ref 0.00–0.07)
Basophils Absolute: 0.1 10*3/uL (ref 0.0–0.1)
Basophils Relative: 1 %
Eosinophils Absolute: 0.2 10*3/uL (ref 0.0–0.5)
Eosinophils Relative: 2 %
HCT: 47.9 % — ABNORMAL HIGH (ref 36.0–46.0)
Hemoglobin: 15.8 g/dL — ABNORMAL HIGH (ref 12.0–15.0)
Immature Granulocytes: 0 %
Lymphocytes Relative: 37 %
Lymphs Abs: 3.5 10*3/uL (ref 0.7–4.0)
MCH: 29.5 pg (ref 26.0–34.0)
MCHC: 33 g/dL (ref 30.0–36.0)
MCV: 89.5 fL (ref 80.0–100.0)
Monocytes Absolute: 0.5 10*3/uL (ref 0.1–1.0)
Monocytes Relative: 5 %
Neutro Abs: 5.2 10*3/uL (ref 1.7–7.7)
Neutrophils Relative %: 55 %
Platelets: 277 10*3/uL (ref 150–400)
RBC: 5.35 MIL/uL — ABNORMAL HIGH (ref 3.87–5.11)
RDW: 13.9 % (ref 11.5–15.5)
WBC: 9.4 10*3/uL (ref 4.0–10.5)
nRBC: 0 % (ref 0.0–0.2)

## 2019-02-02 LAB — COMPREHENSIVE METABOLIC PANEL
ALT: 15 U/L (ref 0–44)
AST: 15 U/L (ref 15–41)
Albumin: 4.4 g/dL (ref 3.5–5.0)
Alkaline Phosphatase: 70 U/L (ref 38–126)
Anion gap: 9 (ref 5–15)
BUN: 19 mg/dL (ref 6–20)
CO2: 28 mmol/L (ref 22–32)
Calcium: 9.5 mg/dL (ref 8.9–10.3)
Chloride: 102 mmol/L (ref 98–111)
Creatinine, Ser: 1.05 mg/dL — ABNORMAL HIGH (ref 0.44–1.00)
GFR calc Af Amer: >60 mL/min (ref >60)
GFR calc non Af Amer: >60 mL/min (ref >60)
Glucose, Bld: 93 mg/dL (ref 70–99)
Potassium: 4.2 mmol/L (ref 3.5–5.1)
Sodium: 139 mmol/L (ref 135–145)
Total Bilirubin: 0.7 mg/dL (ref 0.3–1.2)
Total Protein: 7.5 g/dL (ref 6.5–8.1)

## 2019-02-02 LAB — PROTIME-INR
INR: 1 (ref 0.8–1.2)
Prothrombin Time: 12.8 seconds (ref 11.4–15.2)

## 2019-02-02 LAB — SURGICAL PCR SCREEN
MRSA, PCR: POSITIVE — AB
Staphylococcus aureus: POSITIVE — AB

## 2019-02-02 LAB — C-REACTIVE PROTEIN: CRP: <0.8 mg/dL (ref <1.0)

## 2019-02-02 LAB — TYPE AND SCREEN
ABO/RH(D): AB POS
Antibody Screen: NEGATIVE

## 2019-02-02 LAB — APTT: aPTT: 28 seconds (ref 24–36)

## 2019-02-02 NOTE — Patient Instructions (Signed)
Your procedure is scheduled on: 02-10-19 [facility] Report to Same Day Surgery 2nd floor medical mall [facility] Entrance-take elevator on left to 2nd floor.  Check in with surgery information desk.) To find out your arrival time please call [phone] between 1PM - 3PM on 02-09-19 TUESDAY  Remember: Instructions that are not followed completely Mecum result in serious medical risk, up to and including death, or upon the discretion of your surgeon and anesthesiologist your surgery Stirling need to be rescheduled.    _x___ 1. Do not eat food after midnight the night before your procedure. NO GUM OR CANDY AFTER MIDNIGHT. You Dahlem drink clear liquids up to 2 hours before you are scheduled to arrive at the hospital for your procedure.  Do not drink clear liquids within 2 hours of your scheduled arrival to the hospital.  Clear liquids include  --Water or Apple juice without pulp  --Gatorade  --Black Coffee or Clear Tea (No milk, no creamers, do not add anything to the coffee or Tea)   ____Ensure clear carbohydrate drink on the way to the hospital for bariatric patients  _X___Ensure clear carbohydrate drink 3 hours before surgery.     __x__ 2. No Alcohol for 24 hours before or after surgery.   __x__3. No Smoking or e-cigarettes for 24 prior to surgery.  Do not use any chewable tobacco products for at least 6 hour prior to surgery   ____  4. Bring all medications with you on the day of surgery if instructed.    __x__ 5. Notify your doctor if there is any change in your medical condition     (cold, fever, infections).    x___6. On the morning of surgery brush your teeth with toothpaste and water.  You Armes rinse your mouth with mouth wash if you wish.  Do not swallow any toothpaste or mouthwash.   Do not wear jewelry, make-up, hairpins, clips or nail polish.  Do not wear lotions, powders, or perfumes.  Do not shave 48 hours prior to surgery. Men Ludtke shave face and neck.  Do not bring valuables  to the hospital.    [facility] is not responsible for any belongings or valuables.               Contacts, dentures or bridgework Neuzil not be worn into surgery.  Leave your suitcase in the car. After surgery it Sobczak be brought to your room.  For patients admitted to the hospital, discharge time is determined by your treatment team.  _  Patients discharged the day of surgery will not be allowed to drive home.  You will need someone to drive you home and stay with you the night of your procedure.    Please read over the following fact sheets that you were given:   [facility] Preparing for Surgery and or MRSA Information   _x___ TAKE THE FOLLOWING MEDICATION THE MORNING OF SURGERY WITH A SMALL SIP OF WATER. These include:  1. GABAPENTIN (NEURONTIN)  2. ABILIFY (ARIPIPRAZOLE)  3. EFFEXOR (VENLAFAXINE)  4. BUSPAR (BUSPIRONE)  5. ROBAXIN (METHOCARBAMOL)  6.YOU Dinsmore TAKE YOUR PROPRANOLOL IF NEEDED DAY OF SURGERY  ____Fleets enema or Magnesium Citrate as directed.   _x___ Use CHG Soap or sage wipes as directed on instruction sheet   _X___ Use inhalers on the day of surgery and bring to hospital day of surgery-USE YOUR SPIRIVA AND ALBUTEROL INHALER AM OF SURGERY AND BRING ALBUTEROL [place]  ____ Stop Metformin and Janumet 2  days prior to surgery.    ____ Take 1/2 of usual insulin dose the night before surgery and none on the morning surgery.   ____ Follow recommendations from Cardiologist, Pulmonologist or PCP regarding stopping Aspirin, Coumadin, Plavix ,Eliquis, Effient, or Pradaxa, and Pletal.  X____Stop Anti-inflammatories such as Advil, Aleve, Ibuprofen, Motrin, Naproxen, DICLOFENAC (VOLTAREN) Naprosyn, Goodies powders or aspirin products NOW-OK to take Tylenol    ____ Stop supplements until after surgery.     ____ Bring C-Pap to the hospital.

## 2019-02-03 ENCOUNTER — Ambulatory Visit: Payer: Medicaid Other | Admitting: Licensed Clinical Social Worker

## 2019-02-03 ENCOUNTER — Encounter: Payer: Self-pay | Admitting: Psychiatry

## 2019-02-03 ENCOUNTER — Ambulatory Visit (INDEPENDENT_AMBULATORY_CARE_PROVIDER_SITE_OTHER): Payer: Medicaid Other | Admitting: Psychiatry

## 2019-02-03 DIAGNOSIS — F410 Panic disorder [episodic paroxysmal anxiety] without agoraphobia: Secondary | ICD-10-CM | POA: Insufficient documentation

## 2019-02-03 DIAGNOSIS — F411 Generalized anxiety disorder: Secondary | ICD-10-CM

## 2019-02-03 DIAGNOSIS — F17200 Nicotine dependence, unspecified, uncomplicated: Secondary | ICD-10-CM

## 2019-02-03 DIAGNOSIS — F3130 Bipolar disorder, current episode depressed, mild or moderate severity, unspecified: Secondary | ICD-10-CM

## 2019-02-03 DIAGNOSIS — F1220 Cannabis dependence, uncomplicated: Secondary | ICD-10-CM | POA: Diagnosis not present

## 2019-02-03 LAB — URINE CULTURE: Special Requests: NORMAL

## 2019-02-03 MED ORDER — VENLAFAXINE HCL ER 150 MG PO CP24: 300.0000 mg | ORAL_CAPSULE | Freq: Every day | ORAL | 0 refills | Status: DC

## 2019-02-03 MED ORDER — BUSPIRONE HCL 10 MG PO TABS: 10.0000 mg | ORAL_TABLET | Freq: Two times a day (BID) | ORAL | 1 refills | Status: DC

## 2019-02-03 NOTE — Progress Notes (Signed)
Virtual Visit via Video Note  I connected with Shelly Wright on 02/03/19 at  2:45 PM EDT by a video enabled telemedicine application and verified that I am speaking with the correct person using two identifiers.   I discussed the limitations of evaluation and management by telemedicine and the availability of in person appointments. The patient expressed understanding and agreed to proceed.   I discussed the assessment and treatment plan with the patient. The patient was provided an opportunity to ask questions and all were answered. The patient agreed with the plan and demonstrated an understanding of the instructions.   The patient was advised to call back or seek an in-person evaluation if the symptoms worsen or if the condition fails to improve as anticipated. BH MD OP Progress Note  02/03/2019 5:15 PM Shelly Wright  MRN:  [id number]  Chief Complaint:  Chief Complaint    Follow-up     HPI: Shelly PortsValerie is a 48 year old Caucasian female, separated, on SSI, [place] in [place], has a history of GAD, bipolar disorder, cannabis use disorder, panic attacks, tobacco use disorder was evaluated by telemedicine today.  Patient also has asthma, chronic pain and COPD.  Patient today reports she is currently making progress on the current medication regimen.  She is compliant on her Abilify, venlafaxine and BuSpar.  She reports she is sleeping okay.  She does have good appetite.  She denies any suicidality, homicidality or perceptual disturbances.  She reports she has upcoming knee surgery coming up when she has to be admitted to the hospital.  She reports she has support system from a friend.  She continues to smoke cigarettes as well as cannabis and reports she is not willing to quit at this time.  Provided substance abuse counseling.  She denies any other concerns today. Visit Diagnosis:    ICD-10-CM   1. Bipolar I disorder, most recent episode depressed (HCC)  F31.30 busPIRone (BUSPAR) 10 MG  tablet  2. GAD (generalized anxiety disorder)  F41.1 busPIRone (BUSPAR) 10 MG tablet    venlafaxine XR (EFFEXOR-XR) 150 MG 24 hr capsule  3. Cannabis use disorder, moderate, dependence (HCC)  F12.20   4. Tobacco use disorder  F17.200     Past Psychiatric History: I have reviewed past psychiatric history from my progress note on 09/16/2018.  Past trials of venlafaxine, trazodone, hydroxyzine  Past Medical History:  Past Medical History:  Diagnosis Date  . Acute right-sided low back pain with right-sided sciatica 08/30/2014  . Anxiety   . Arthritis   . Asthma    in past, no current inhalers  . Bipolar disorder (HCC)   . Cervical spinal cord compression (HCC) 05/07/2012  . COPD (chronic obstructive pulmonary disease) (HCC)   . DDD (degenerative disc disease), lumbar   . Depression   . Dyspnea    with exertion  . Dysrhythmia    irregular-h/o in the past  . Fibromyalgia   . Hypertension   . Neuritis or radiculitis due to rupture of lumbar intervertebral disc 05/23/2014  . Periprosthetic fracture around prosthetic knee 07/31/2015  . Restless leg syndrome   . Soft tissue lesion of shoulder region 01/24/2012  . 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 Syncope and collapse 04/08/2014  . Thoracic and lumbosacral neuritis 05/07/2012  . Thoracic neuritis 05/07/2012    Past Surgical History:  Procedure Laterality Date  . ABDOMINAL HYSTERECTOMY    . Cervical Fusion X  2    . JOINT REPLACEMENT Right 01/2016   Dr Jefm Bryant  . KNEE ARTHROSCOPY Bilateral    Right knee scope 1998, Left knee scope  . TONSILLECTOMY    . TOTAL KNEE ARTHROPLASTY Right 02/01/2015   Procedure: TOTAL KNEE ARTHROPLASTY;  Surgeon: Leanor Kail, MD;  Location: [facility] ORS;  Service: Orthopedics;  Laterality: Right;  . TUBAL LIGATION      Family Psychiatric History: I have reviewed family psychiatric history from my progress note on  09/16/2018  Family History:  Family History  Problem Relation Age of Onset  . Breast cancer Mother   . Colon cancer Father   . Heart attack Father   . Bipolar disorder Sister   . Depression Sister   . Schizophrenia Sister     Social History: I have reviewed social history from my progress note on 09/16/2018 Social History   Socioeconomic History  . Marital status: Legally Separated    Spouse name: Not on file  . Number of children: 1  . Years of education: Not on file  . Highest education level: High school graduate  Occupational History  . Not on file  Social Needs  . Financial resource strain: Not hard at all  . Food insecurity    Worry: Never true    Inability: Never true  . Transportation needs    Medical: No    Non-medical: No  Tobacco Use  . Smoking status: Current Every Day Smoker    Packs/day: 1.00    Years: 30.00    Pack years: 30.00    Types: Cigarettes  . Smokeless tobacco: Never Used  Substance and Sexual Activity  . Alcohol use: No  . Drug use: Yes    Types: Marijuana    Comment: every day  . Sexual activity: Yes  Lifestyle  . Physical activity    Days per week: 0 days    Minutes per session: 0 min  . Stress: To some extent  Relationships  . Social Herbalist on phone: Not on file    Gets together: Not on file    Attends religious service: Never    Active member of club or organization: No    Attends meetings of clubs or organizations: Never    Relationship status: Separated  Other Topics Concern  . Not on file  Social History Narrative  . Not on file    Allergies: No Known Allergies  Metabolic Disorder Labs: No results found for: HGBA1C, MPG No results found for: PROLACTIN No results found for: CHOL, TRIG, HDL, CHOLHDL, VLDL, LDLCALC No results found for: TSH  Therapeutic Level Labs: No results found for: LITHIUM No results found for: VALPROATE No components found for:  CBMZ  Current Medications: Current Outpatient  Medications  Medication Sig Dispense Refill  . albuterol (VENTOLIN HFA) 108 (90 Base) MCG/ACT inhaler Inhale 2 puffs into the lungs 4 (four) times daily.     . ARIPiprazole (ABILIFY) 15 MG tablet TAKE 1 TABLET BY MOUTH DAILY (Patient taking differently: Take 15 mg by mouth every morning. ) 90 tablet 0  . busPIRone (BUSPAR) 10 MG tablet Take 1 tablet (10 mg total) by mouth 2 (two) times daily. 180 tablet 1  . diclofenac (VOLTAREN) 75 MG EC tablet Take 1 tablet (75 mg total) by mouth 2 (two) times daily. 60 tablet 5  . gabapentin (NEURONTIN) 800 MG tablet Take 1 tablet (800 mg total) by mouth every 6 (six) hours. 120 tablet 5  . hydrochlorothiazide (  HYDRODIURIL) 25 MG tablet Take 25 mg by mouth every morning.   1  . lidocaine (XYLOCAINE) 5 % ointment Apply 1 application topically 4 (four) times daily as needed for moderate pain. (1application= 5 g, or 6 inches of ointment) Max: 20 g/day 50 g PRN  . methocarbamol (ROBAXIN) 750 MG tablet Take 1 tablet (750 mg total) by mouth 3 (three) times daily. Max: 3/day 90 tablet 5  . propranolol (INDERAL) 10 MG tablet TAKE 1 TABLET BY MOUTH THREE TIMES DAILYAS NEEDED FOR SEVERE PANIC ATTACKS ONLY (Patient taking differently: Take 10 mg by mouth 3 (three) times daily as needed (panic attacks.). ) 90 tablet 1  . simvastatin (ZOCOR) 20 MG tablet Take 20 mg by mouth at bedtime.     Marland Kitchen tiotropium (SPIRIVA HANDIHALER) 18 MCG inhalation capsule Place 18 mcg into inhaler and inhale every morning.     . traZODone (DESYREL) 100 MG tablet Take 2 tablets (200 mg total) by mouth at bedtime as needed for sleep. (Patient taking differently: Take 200 mg by mouth at bedtime. ) 180 tablet 0  . venlafaxine XR (EFFEXOR-XR) 150 MG 24 hr capsule Take 2 capsules (300 mg total) by mouth daily with breakfast. 180 capsule 0   No current facility-administered medications for this visit.      Musculoskeletal: Strength & Muscle Tone: UTA Gait & Station: Uses a cane Patient leans:  N/A  Psychiatric Specialty Exam: Review of Systems  Musculoskeletal: Positive for joint pain.  Psychiatric/Behavioral: The patient is not nervous/anxious.   All other systems reviewed and are negative.   There were no vitals taken for this visit.There is no height or weight on file to calculate BMI.  General Appearance: Casual  Eye Contact:  Fair  Speech:  Clear and Coherent  Volume:  Normal  Mood:  Euthymic  Affect:  Congruent  Thought Process:  Goal Directed and Descriptions of Associations: Intact  Orientation:  Full (Time, Place, and Person)  Thought Content: Logical   Suicidal Thoughts:  No  Homicidal Thoughts:  No  Memory:  Immediate;   Fair Recent;   Fair Remote;   Fair  Judgement:  Fair  Insight:  Fair  Psychomotor Activity:  Normal  Concentration:  Concentration: Fair and Attention Span: Fair  Recall:  [employer] of Knowledge: Fair  Language: Fair  Akathisia:  No  Handed:  Right  AIMS (if indicated): denies tremors, rigidity  Assets:  Communication Skills Desire for Improvement Housing Social Support  ADL's:  Intact  Cognition: WNL  Sleep:  Fair   Screenings: PHQ2-9     Clinical Support from 08/26/2017 in [facility] Office Visit from 09/11/2016 in [facility] Procedure visit from 08/14/2016 in [facility] Office Visit from 07/09/2016 in [facility] Office Visit from 05/06/2016 in [facility]  PHQ-2 Total Score  0  0  0  0  0       Assessment and Plan: Shelly Wright is a 49 year old Caucasian female, on SSI, lives in [place], has a history of bipolar disorder, anxiety disorder, tobacco use disorder, asthma, COPD, chronic pain was evaluated by telemedicine today.  Patient is biologically predisposed given her family history of mental health problems as  well as her own substance abuse problems.  She also has a history of trauma, history of chronic pain.  Patient with psychosocial stressors of health issues  as well as her struggles with her nephew having emotional problems, financial stressors, separation from husband, death of her sister, COVID-19 outbreak.  Patient however is currently making progress on current medication regimen.  Plan Bipolar disorder-improving Abilify 15 mg p.o. daily Trazodone 200 mg p.o. nightly for sleep.  GAD-improving Venlafaxine 300 mg p.o. daily BuSpar increased to 10 mg p.o. twice daily Propranolol 10 mg p.o. 3 times daily as needed for anxiety attacks Discussed with patient to continue to work with Ms. Peacock her therapist.  Patient was referred for IOP in the past however she declined.   For cannabis use disorder-unstable-he is unwilling to quit provided substance abuse counseling.  For tobacco use disorder-unstable Provided smoking cessation counseling.  She is not ready to quit.  Follow-up in clinic in 2 months or sooner if needed.  November 17 at 10:30 AM  I have spent atleast 15 minutes non face to face with patient today. More than 50 % of the time was spent for psychoeducation and supportive psychotherapy and care coordination. This note was generated in part or whole with voice recognition software. Voice recognition is usually quite accurate but there are transcription errors that can and very often do occur. I apologize for any typographical errors that were not detected and corrected.      Jomarie LongsSaramma Labrisha Wuellner, MD 02/03/2019, 5:15 PM

## 2019-02-04 ENCOUNTER — Other Ambulatory Visit: Payer: Self-pay

## 2019-02-04 ENCOUNTER — Ambulatory Visit: Payer: Medicaid Other | Admitting: Licensed Clinical Social Worker

## 2019-02-05 ENCOUNTER — Other Ambulatory Visit
Admission: RE | Admit: 2019-02-05 | Discharge: 2019-02-05 | Disposition: A | Discharge status: Home / Self Care | Payer: Medicaid Other | Source: Ambulatory Visit | Attending: Orthopedic Surgery | Admitting: Orthopedic Surgery

## 2019-02-05 DIAGNOSIS — Z01812 Encounter for preprocedural laboratory examination: Secondary | ICD-10-CM | POA: Insufficient documentation

## 2019-02-05 DIAGNOSIS — Z20828 Contact with and (suspected) exposure to other viral communicable diseases: Secondary | ICD-10-CM | POA: Insufficient documentation

## 2019-02-05 LAB — SARS CORONAVIRUS 2 (TAT 6-24 HRS): SARS Coronavirus 2: NEGATIVE

## 2019-02-05 NOTE — Pre-Procedure Instructions (Signed)
X-ray chest PA and lateral9/05/2018 [place] Result Narrative   Procedure: XR CHEST PA AND LATERAL  Indication: cough, wheezing, tobacco use, COPD, R06.2 Wheezing, J44.9 Chronic obstructive pulmonary disease, unspecified (CMS-HCC)  Comparison: [place] there are no comparisons  Findings and Impression:    1. Normal cardiac mediastinal silhouette. C-spine hardware, partially visualized. 2. No focal pulmonary opacity. 3. No pleural effusion.  4. Mild multilevel osteophyte formation in the thoracic spine.  Electronically Signed by: Greg Cutter, MD, [facility] Electronically Signed on: 01/19/2019 11:08 AM  Other Result Information  Interface, Rad Results In - 01/19/2019 11:09 AM EDT  Procedure: XR CHEST PA AND LATERAL  Indication: cough, wheezing, tobacco use, COPD, R06.2 Wheezing, J44.9 Chronic obstructive pulmonary disease, unspecified (CMS-HCC)  Comparison: [place] there are no comparisons  Findings and Impression:    1. Normal cardiac mediastinal silhouette. C-spine hardware, partially visualized. 2. No focal pulmonary opacity. 3. No pleural effusion.  4. Mild multilevel osteophyte formation in the thoracic spine.  Electronically Signed by:  Greg Cutter, MD, [facility] Electronically Signed on:  01/19/2019 11:08 AM  Status Results Details   Encounter Summary  ECG 12-lead9/05/2018 [place] Component Name Value Ref Range  Vent Rate (bpm) 65   PR Interval (msec) 156   QRS Interval (msec) 78   QT Interval (msec) 420   QTc (msec) 436   Other Result Information  This result has an attachment that is not available.  Result Narrative  Normal sinus rhythm Consider anterior infarction Abnormal ECG  No previous ECGs available I reviewed and concur with this report. Electronically signed VC:[id number], MD, NEIL ([id number]) on 01/19/2019 10:28:39 PM  Status Results Details   Encounter Summary

## 2019-02-08 NOTE — Pre-Procedure Instructions (Signed)
Pt states on 9-15 for her preop appt in [facility] that she had seen her PCP on 01-19-19 and that her PCP knew she was having surgery and did an EKG, CXR and labs for upcoming surgery. I faxed over form on 02-05-19 asking her PCP if her EKG that was done on 9-1 was ok to proceed with her surgery on 02-10-19. I just saw note in Care everywhere that pt needs cardiac clearance due to abnormal EKG that was done in PCP office on 01-19-19. Tiffany notified of this at Dr Maree Krabbe office and is calling Dr Karna Christmas office about this

## 2019-02-09 ENCOUNTER — Telehealth: Payer: Self-pay

## 2019-02-09 ENCOUNTER — Other Ambulatory Visit: Payer: Self-pay

## 2019-02-09 ENCOUNTER — Ambulatory Visit: Payer: Medicaid Other | Admitting: Licensed Clinical Social Worker

## 2019-02-09 DIAGNOSIS — R9431 Abnormal electrocardiogram [ECG] [EKG]: Secondary | ICD-10-CM | POA: Insufficient documentation

## 2019-02-09 NOTE — Telephone Encounter (Addendum)
I called [place] and but she was not available. I left her a message and advised her to call us back.

## 2019-02-10 ENCOUNTER — Encounter: Payer: Self-pay | Admitting: Orthopedic Surgery

## 2019-02-10 ENCOUNTER — Inpatient Hospital Stay: Payer: Medicaid Other | Admitting: Certified Registered Nurse Anesthetist

## 2019-02-10 ENCOUNTER — Other Ambulatory Visit: Payer: Self-pay

## 2019-02-10 ENCOUNTER — Inpatient Hospital Stay
Admission: RE | Admit: 2019-02-10 | Discharge: 2019-02-12 | DRG: 470 | Disposition: A | Discharge status: Home Health | Payer: Medicaid Other | Attending: Orthopedic Surgery | Admitting: Orthopedic Surgery

## 2019-02-10 ENCOUNTER — Encounter
Admission: RE | Disposition: A | Discharge status: Home Health | Payer: Self-pay | Source: Home / Self Care | Attending: Orthopedic Surgery

## 2019-02-10 ENCOUNTER — Inpatient Hospital Stay: Payer: Medicaid Other

## 2019-02-10 DIAGNOSIS — Z803 Family history of malignant neoplasm of breast: Secondary | ICD-10-CM

## 2019-02-10 DIAGNOSIS — G2581 Restless legs syndrome: Secondary | ICD-10-CM | POA: Diagnosis present

## 2019-02-10 DIAGNOSIS — E669 Obesity, unspecified: Secondary | ICD-10-CM | POA: Diagnosis present

## 2019-02-10 DIAGNOSIS — Z800 Family history of malignant neoplasm of digestive organs: Secondary | ICD-10-CM

## 2019-02-10 DIAGNOSIS — M1712 Unilateral primary osteoarthritis, left knee: Secondary | ICD-10-CM | POA: Diagnosis present

## 2019-02-10 DIAGNOSIS — E785 Hyperlipidemia, unspecified: Secondary | ICD-10-CM | POA: Diagnosis present

## 2019-02-10 DIAGNOSIS — F17210 Nicotine dependence, cigarettes, uncomplicated: Secondary | ICD-10-CM | POA: Diagnosis present

## 2019-02-10 DIAGNOSIS — J449 Chronic obstructive pulmonary disease, unspecified: Secondary | ICD-10-CM | POA: Diagnosis present

## 2019-02-10 DIAGNOSIS — Z96651 Presence of right artificial knee joint: Secondary | ICD-10-CM | POA: Diagnosis present

## 2019-02-10 DIAGNOSIS — Z6837 Body mass index (BMI) 37.0-37.9, adult: Secondary | ICD-10-CM

## 2019-02-10 DIAGNOSIS — M797 Fibromyalgia: Secondary | ICD-10-CM | POA: Diagnosis present

## 2019-02-10 DIAGNOSIS — I10 Essential (primary) hypertension: Secondary | ICD-10-CM | POA: Diagnosis present

## 2019-02-10 DIAGNOSIS — G894 Chronic pain syndrome: Secondary | ICD-10-CM | POA: Diagnosis present

## 2019-02-10 DIAGNOSIS — Z8249 Family history of ischemic heart disease and other diseases of the circulatory system: Secondary | ICD-10-CM

## 2019-02-10 DIAGNOSIS — Z96659 Presence of unspecified artificial knee joint: Secondary | ICD-10-CM

## 2019-02-10 DIAGNOSIS — M5136 Other intervertebral disc degeneration, lumbar region: Secondary | ICD-10-CM | POA: Diagnosis present

## 2019-02-10 DIAGNOSIS — Z823 Family history of stroke: Secondary | ICD-10-CM

## 2019-02-10 HISTORY — PX: KNEE ARTHROPLASTY: SHX992

## 2019-02-10 LAB — URINE DRUG SCREEN, QUALITATIVE (ARMC ONLY)
Amphetamines, Ur Screen: NOT DETECTED
Barbiturates, Ur Screen: NOT DETECTED
Benzodiazepine, Ur Scrn: NOT DETECTED
Cannabinoid 50 Ng, Ur ~~LOC~~: POSITIVE — AB
Cocaine Metabolite,Ur ~~LOC~~: NOT DETECTED
MDMA (Ecstasy)Ur Screen: NOT DETECTED
Methadone Scn, Ur: NOT DETECTED
Opiate, Ur Screen: NOT DETECTED
Phencyclidine (PCP) Ur S: NOT DETECTED
Tricyclic, Ur Screen: NOT DETECTED

## 2019-02-10 LAB — ABO/RH: ABO/RH(D): AB POS

## 2019-02-10 SURGERY — ARTHROPLASTY, KNEE, TOTAL, USING IMAGELESS COMPUTER-ASSISTED NAVIGATION
Anesthesia: Spinal | Site: Knee | Laterality: Left

## 2019-02-10 MED ORDER — CEFAZOLIN SODIUM 1 G IJ SOLR
INTRAMUSCULAR | Status: AC
  Filled 2019-02-10: qty 20

## 2019-02-10 MED ORDER — METOCLOPRAMIDE HCL 10 MG PO TABS: 5.0000 mg | ORAL_TABLET | Freq: Three times a day (TID) | ORAL | Status: DC | PRN

## 2019-02-10 MED ORDER — PROPOFOL 10 MG/ML IV BOLUS
INTRAVENOUS | Status: AC
  Filled 2019-02-10: qty 20

## 2019-02-10 MED ORDER — MIDAZOLAM HCL 2 MG/2ML IJ SOLN
INTRAMUSCULAR | Status: AC
  Filled 2019-02-10: qty 2

## 2019-02-10 MED ORDER — TRANEXAMIC ACID-NACL 1000-0.7 MG/100ML-% IV SOLN
1000.0000 mg | Freq: Once | INTRAVENOUS | Status: AC
  Administered 2019-02-10: 17:00:00 1000 mg via INTRAVENOUS
  Filled 2019-02-10: qty 100

## 2019-02-10 MED ORDER — SODIUM CHLORIDE 0.9 % IV SOLN
INTRAVENOUS | Status: DC | PRN
  Administered 2019-02-10: 50 ug/min via INTRAVENOUS

## 2019-02-10 MED ORDER — CEFAZOLIN SODIUM-DEXTROSE 2-4 GM/100ML-% IV SOLN
2.0000 g | Freq: Four times a day (QID) | INTRAVENOUS | Status: AC
  Administered 2019-02-10 – 2019-02-11 (×4): 2 g via INTRAVENOUS
  Filled 2019-02-10 (×4): qty 100

## 2019-02-10 MED ORDER — BUPIVACAINE HCL (PF) 0.5 % IJ SOLN
INTRAMUSCULAR | Status: DC | PRN
  Administered 2019-02-10: 2.5 mL

## 2019-02-10 MED ORDER — FENTANYL CITRATE (PF) 100 MCG/2ML IJ SOLN
INTRAMUSCULAR | Status: DC | PRN
  Administered 2019-02-10 (×4): 25 ug via INTRAVENOUS

## 2019-02-10 MED ORDER — CELECOXIB 200 MG PO CAPS
200.0000 mg | ORAL_CAPSULE | Freq: Two times a day (BID) | ORAL | Status: DC
  Administered 2019-02-10 – 2019-02-12 (×4): 200 mg via ORAL
  Filled 2019-02-10 (×4): qty 1

## 2019-02-10 MED ORDER — MAGNESIUM HYDROXIDE 400 MG/5ML PO SUSP
30.0000 mL | Freq: Every day | ORAL | Status: DC
  Administered 2019-02-11 – 2019-02-12 (×2): 30 mL via ORAL
  Filled 2019-02-10 (×2): qty 30

## 2019-02-10 MED ORDER — DIPHENHYDRAMINE HCL 12.5 MG/5ML PO ELIX: 12.5000 mg | ORAL_SOLUTION | ORAL | Status: DC | PRN

## 2019-02-10 MED ORDER — PROPRANOLOL HCL 10 MG PO TABS
10.0000 mg | ORAL_TABLET | Freq: Three times a day (TID) | ORAL | Status: DC | PRN
  Filled 2019-02-10: qty 1

## 2019-02-10 MED ORDER — ALBUTEROL SULFATE (2.5 MG/3ML) 0.083% IN NEBU
2.5000 mg | INHALATION_SOLUTION | Freq: Four times a day (QID) | RESPIRATORY_TRACT | Status: DC
  Administered 2019-02-10 – 2019-02-12 (×7): 2.5 mg via RESPIRATORY_TRACT
  Filled 2019-02-10 (×8): qty 3

## 2019-02-10 MED ORDER — MIDAZOLAM HCL 5 MG/5ML IJ SOLN
INTRAMUSCULAR | Status: DC | PRN
  Administered 2019-02-10: 2 mg via INTRAVENOUS

## 2019-02-10 MED ORDER — TRANEXAMIC ACID-NACL 1000-0.7 MG/100ML-% IV SOLN
INTRAVENOUS | Status: DC | PRN
  Administered 2019-02-10: 1000 mg via INTRAVENOUS

## 2019-02-10 MED ORDER — BUSPIRONE HCL 10 MG PO TABS
10.0000 mg | ORAL_TABLET | Freq: Two times a day (BID) | ORAL | Status: DC
  Administered 2019-02-10 – 2019-02-12 (×4): 10 mg via ORAL
  Filled 2019-02-10 (×4): qty 1

## 2019-02-10 MED ORDER — CHLORHEXIDINE GLUCONATE CLOTH 2 % EX PADS
6.0000 | MEDICATED_PAD | Freq: Every day | CUTANEOUS | Status: DC
  Administered 2019-02-11: 6 via TOPICAL

## 2019-02-10 MED ORDER — LACTATED RINGERS IV SOLN
INTRAVENOUS | Status: DC
  Administered 2019-02-10 (×2): via INTRAVENOUS

## 2019-02-10 MED ORDER — SIMVASTATIN 20 MG PO TABS
20.0000 mg | ORAL_TABLET | Freq: Every day | ORAL | Status: DC
  Administered 2019-02-10 – 2019-02-11 (×2): 20 mg via ORAL
  Filled 2019-02-10 (×2): qty 1

## 2019-02-10 MED ORDER — HYDROMORPHONE HCL 1 MG/ML IJ SOLN: 0.5000 mg | INTRAMUSCULAR | Status: DC | PRN

## 2019-02-10 MED ORDER — METHOCARBAMOL 750 MG PO TABS
750.0000 mg | ORAL_TABLET | Freq: Three times a day (TID) | ORAL | Status: DC
  Administered 2019-02-10 – 2019-02-12 (×6): 750 mg via ORAL
  Filled 2019-02-10 (×8): qty 1

## 2019-02-10 MED ORDER — SENNOSIDES-DOCUSATE SODIUM 8.6-50 MG PO TABS
1.0000 | ORAL_TABLET | Freq: Two times a day (BID) | ORAL | Status: DC
  Administered 2019-02-10 – 2019-02-12 (×4): 1 via ORAL
  Filled 2019-02-10 (×4): qty 1

## 2019-02-10 MED ORDER — NICOTINE 21 MG/24HR TD PT24
21.0000 mg | MEDICATED_PATCH | Freq: Every day | TRANSDERMAL | Status: DC
  Administered 2019-02-10 – 2019-02-11 (×2): 21 mg via TRANSDERMAL
  Filled 2019-02-10 (×2): qty 1

## 2019-02-10 MED ORDER — CELECOXIB 200 MG PO CAPS
400.0000 mg | ORAL_CAPSULE | Freq: Once | ORAL | Status: AC
  Administered 2019-02-10: 10:00:00 400 mg via ORAL

## 2019-02-10 MED ORDER — TRAMADOL HCL 50 MG PO TABS: 50.0000 mg | ORAL_TABLET | ORAL | Status: DC | PRN

## 2019-02-10 MED ORDER — ENSURE PRE-SURGERY PO LIQD
296.0000 mL | Freq: Once | ORAL | Status: DC
  Filled 2019-02-10: qty 296

## 2019-02-10 MED ORDER — OXYCODONE HCL 5 MG PO TABS: 5.0000 mg | ORAL_TABLET | ORAL | Status: DC | PRN

## 2019-02-10 MED ORDER — MENTHOL 3 MG MT LOZG
1.0000 | LOZENGE | OROMUCOSAL | Status: DC | PRN
  Filled 2019-02-10: qty 9

## 2019-02-10 MED ORDER — DEXAMETHASONE SODIUM PHOSPHATE 10 MG/ML IJ SOLN
INTRAMUSCULAR | Status: AC
  Administered 2019-02-10: 10:00:00 8 mg via INTRAVENOUS
  Filled 2019-02-10: qty 1

## 2019-02-10 MED ORDER — TIOTROPIUM BROMIDE MONOHYDRATE 18 MCG IN CAPS
18.0000 ug | ORAL_CAPSULE | RESPIRATORY_TRACT | Status: DC
  Administered 2019-02-11: 18 ug via RESPIRATORY_TRACT
  Filled 2019-02-10: qty 5

## 2019-02-10 MED ORDER — SUGAMMADEX SODIUM 200 MG/2ML IV SOLN
INTRAVENOUS | Status: AC
  Filled 2019-02-10: qty 2

## 2019-02-10 MED ORDER — TRAZODONE HCL 100 MG PO TABS
200.0000 mg | ORAL_TABLET | Freq: Every day | ORAL | Status: DC
  Administered 2019-02-10 – 2019-02-11 (×2): 200 mg via ORAL
  Filled 2019-02-10 (×2): qty 2

## 2019-02-10 MED ORDER — TETRACAINE HCL 1 % IJ SOLN
INTRAMUSCULAR | Status: DC | PRN
  Administered 2019-02-10: 5 mg via INTRASPINAL

## 2019-02-10 MED ORDER — GABAPENTIN 300 MG PO CAPS: 300.0000 mg | ORAL_CAPSULE | Freq: Once | ORAL | Status: DC

## 2019-02-10 MED ORDER — HYDROCHLOROTHIAZIDE 25 MG PO TABS
25.0000 mg | ORAL_TABLET | ORAL | Status: DC
  Administered 2019-02-11 – 2019-02-12 (×2): 25 mg via ORAL
  Filled 2019-02-10 (×2): qty 1

## 2019-02-10 MED ORDER — ONDANSETRON HCL 4 MG PO TABS: 4.0000 mg | ORAL_TABLET | Freq: Four times a day (QID) | ORAL | Status: DC | PRN

## 2019-02-10 MED ORDER — CHLORHEXIDINE GLUCONATE 4 % EX LIQD: 60.0000 mL | Freq: Once | CUTANEOUS | Status: DC

## 2019-02-10 MED ORDER — PHENOL 1.4 % MT LIQD
1.0000 | OROMUCOSAL | Status: DC | PRN
  Filled 2019-02-10: qty 177

## 2019-02-10 MED ORDER — PANTOPRAZOLE SODIUM 40 MG PO TBEC
40.0000 mg | DELAYED_RELEASE_TABLET | Freq: Two times a day (BID) | ORAL | Status: DC
  Administered 2019-02-11 – 2019-02-12 (×3): 40 mg via ORAL
  Filled 2019-02-10 (×4): qty 1

## 2019-02-10 MED ORDER — SODIUM CHLORIDE 0.9 % IV SOLN
INTRAVENOUS | Status: DC
  Administered 2019-02-10 – 2019-02-11 (×2): via INTRAVENOUS

## 2019-02-10 MED ORDER — FAMOTIDINE 20 MG PO TABS
ORAL_TABLET | ORAL | Status: AC
  Administered 2019-02-10: 20 mg via ORAL
  Filled 2019-02-10: qty 1

## 2019-02-10 MED ORDER — VENLAFAXINE HCL ER 150 MG PO CP24
300.0000 mg | ORAL_CAPSULE | Freq: Every day | ORAL | Status: DC
  Administered 2019-02-11 – 2019-02-12 (×2): 300 mg via ORAL
  Filled 2019-02-10 (×3): qty 2

## 2019-02-10 MED ORDER — FLEET ENEMA 7-19 GM/118ML RE ENEM: 1.0000 | ENEMA | Freq: Once | RECTAL | Status: DC | PRN

## 2019-02-10 MED ORDER — VANCOMYCIN HCL IN DEXTROSE 1-5 GM/200ML-% IV SOLN
1000.0000 mg | Freq: Once | INTRAVENOUS | Status: AC
  Administered 2019-02-10: 10:00:00 1000 mg via INTRAVENOUS

## 2019-02-10 MED ORDER — DEXAMETHASONE SODIUM PHOSPHATE 10 MG/ML IJ SOLN
INTRAMUSCULAR | Status: AC
  Filled 2019-02-10: qty 1

## 2019-02-10 MED ORDER — PROPOFOL 500 MG/50ML IV EMUL
INTRAVENOUS | Status: DC | PRN
  Administered 2019-02-10: 50 ug/kg/min via INTRAVENOUS

## 2019-02-10 MED ORDER — TRANEXAMIC ACID-NACL 1000-0.7 MG/100ML-% IV SOLN
1000.0000 mg | INTRAVENOUS | Status: DC
  Filled 2019-02-10: qty 100

## 2019-02-10 MED ORDER — GABAPENTIN 400 MG PO CAPS
800.0000 mg | ORAL_CAPSULE | Freq: Four times a day (QID) | ORAL | Status: DC
  Administered 2019-02-10 – 2019-02-12 (×8): 800 mg via ORAL
  Filled 2019-02-10 (×8): qty 2

## 2019-02-10 MED ORDER — BUPIVACAINE HCL (PF) 0.25 % IJ SOLN
INTRAMUSCULAR | Status: DC | PRN
  Administered 2019-02-10: 60 mL

## 2019-02-10 MED ORDER — CEFAZOLIN SODIUM-DEXTROSE 2-3 GM-%(50ML) IV SOLR
INTRAVENOUS | Status: DC | PRN
  Administered 2019-02-10: 2 g via INTRAVENOUS

## 2019-02-10 MED ORDER — LIDOCAINE HCL (CARDIAC) PF 100 MG/5ML IV SOSY
PREFILLED_SYRINGE | INTRAVENOUS | Status: DC | PRN
  Administered 2019-02-10: 100 mg via INTRAVENOUS

## 2019-02-10 MED ORDER — TETRACAINE HCL 1 % IJ SOLN
INTRAMUSCULAR | Status: AC
  Filled 2019-02-10: qty 2

## 2019-02-10 MED ORDER — ACETAMINOPHEN 10 MG/ML IV SOLN
INTRAVENOUS | Status: DC | PRN
  Administered 2019-02-10: 1000 mg via INTRAVENOUS

## 2019-02-10 MED ORDER — METOCLOPRAMIDE HCL 5 MG/ML IJ SOLN: 5.0000 mg | Freq: Three times a day (TID) | INTRAMUSCULAR | Status: DC | PRN

## 2019-02-10 MED ORDER — OXYCODONE HCL 5 MG/5ML PO SOLN: 5.0000 mg | Freq: Once | ORAL | Status: DC | PRN

## 2019-02-10 MED ORDER — FENTANYL CITRATE (PF) 100 MCG/2ML IJ SOLN
INTRAMUSCULAR | Status: AC
  Filled 2019-02-10: qty 2

## 2019-02-10 MED ORDER — ACETAMINOPHEN 325 MG PO TABS: 325.0000 mg | ORAL_TABLET | Freq: Four times a day (QID) | ORAL | Status: DC | PRN

## 2019-02-10 MED ORDER — FERROUS SULFATE 325 (65 FE) MG PO TABS
325.0000 mg | ORAL_TABLET | Freq: Two times a day (BID) | ORAL | Status: DC
  Administered 2019-02-10 – 2019-02-12 (×4): 325 mg via ORAL
  Filled 2019-02-10 (×4): qty 1

## 2019-02-10 MED ORDER — SODIUM CHLORIDE 0.9 % IV SOLN
INTRAVENOUS | Status: DC | PRN
  Administered 2019-02-10: 60 mL

## 2019-02-10 MED ORDER — NEOMYCIN-POLYMYXIN B GU 40-200000 IR SOLN
Status: DC | PRN
  Administered 2019-02-10: 14 mL

## 2019-02-10 MED ORDER — DEXAMETHASONE SODIUM PHOSPHATE 10 MG/ML IJ SOLN
8.0000 mg | Freq: Once | INTRAMUSCULAR | Status: AC
  Administered 2019-02-10: 10:00:00 8 mg via INTRAVENOUS

## 2019-02-10 MED ORDER — VANCOMYCIN HCL IN DEXTROSE 1-5 GM/200ML-% IV SOLN
INTRAVENOUS | Status: AC
  Administered 2019-02-10: 10:00:00 1000 mg via INTRAVENOUS
  Filled 2019-02-10: qty 200

## 2019-02-10 MED ORDER — ARIPIPRAZOLE 15 MG PO TABS
15.0000 mg | ORAL_TABLET | ORAL | Status: DC
  Administered 2019-02-11 – 2019-02-12 (×2): 15 mg via ORAL
  Filled 2019-02-10 (×2): qty 1

## 2019-02-10 MED ORDER — ACETAMINOPHEN 10 MG/ML IV SOLN
INTRAVENOUS | Status: AC
  Filled 2019-02-10: qty 100

## 2019-02-10 MED ORDER — ALUM & MAG HYDROXIDE-SIMETH 200-200-20 MG/5ML PO SUSP: 30.0000 mL | ORAL | Status: DC | PRN

## 2019-02-10 MED ORDER — LIDOCAINE HCL (PF) 2 % IJ SOLN
INTRAMUSCULAR | Status: AC
  Filled 2019-02-10: qty 10

## 2019-02-10 MED ORDER — CELECOXIB 200 MG PO CAPS
ORAL_CAPSULE | ORAL | Status: AC
  Administered 2019-02-10: 400 mg via ORAL
  Filled 2019-02-10: qty 2

## 2019-02-10 MED ORDER — INFLUENZA VAC SPLIT QUAD 0.5 ML IM SUSY
0.5000 mL | PREFILLED_SYRINGE | INTRAMUSCULAR | Status: DC
  Filled 2019-02-10: qty 0.5

## 2019-02-10 MED ORDER — ONDANSETRON HCL 4 MG/2ML IJ SOLN: 4.0000 mg | Freq: Four times a day (QID) | INTRAMUSCULAR | Status: DC | PRN

## 2019-02-10 MED ORDER — ENOXAPARIN SODIUM 30 MG/0.3ML ~~LOC~~ SOLN
30.0000 mg | Freq: Two times a day (BID) | SUBCUTANEOUS | Status: DC
  Administered 2019-02-11 – 2019-02-12 (×3): 30 mg via SUBCUTANEOUS
  Filled 2019-02-10 (×3): qty 0.3

## 2019-02-10 MED ORDER — FAMOTIDINE 20 MG PO TABS
20.0000 mg | ORAL_TABLET | Freq: Once | ORAL | Status: AC
  Administered 2019-02-10: 10:00:00 20 mg via ORAL

## 2019-02-10 MED ORDER — METOCLOPRAMIDE HCL 10 MG PO TABS
10.0000 mg | ORAL_TABLET | Freq: Three times a day (TID) | ORAL | Status: AC
  Administered 2019-02-10 – 2019-02-12 (×7): 10 mg via ORAL
  Filled 2019-02-10 (×7): qty 1

## 2019-02-10 MED ORDER — SEVOFLURANE IN SOLN
RESPIRATORY_TRACT | Status: AC
  Filled 2019-02-10: qty 250

## 2019-02-10 MED ORDER — OXYCODONE HCL 5 MG PO TABS
10.0000 mg | ORAL_TABLET | ORAL | Status: DC | PRN
  Administered 2019-02-10 – 2019-02-12 (×9): 10 mg via ORAL
  Filled 2019-02-10 (×9): qty 2

## 2019-02-10 MED ORDER — OXYCODONE HCL 5 MG PO TABS: 5.0000 mg | ORAL_TABLET | Freq: Once | ORAL | Status: DC | PRN

## 2019-02-10 MED ORDER — CEFAZOLIN SODIUM-DEXTROSE 2-4 GM/100ML-% IV SOLN: 2.0000 g | INTRAVENOUS | Status: DC

## 2019-02-10 MED ORDER — LIDOCAINE HCL (PF) 1 % IJ SOLN
INTRAMUSCULAR | Status: DC | PRN
  Administered 2019-02-10: 2 mL

## 2019-02-10 MED ORDER — FENTANYL CITRATE (PF) 100 MCG/2ML IJ SOLN: 25.0000 ug | INTRAMUSCULAR | Status: DC | PRN

## 2019-02-10 MED ORDER — ACETAMINOPHEN 10 MG/ML IV SOLN
1000.0000 mg | Freq: Four times a day (QID) | INTRAVENOUS | Status: AC
  Administered 2019-02-10 – 2019-02-11 (×4): 1000 mg via INTRAVENOUS
  Filled 2019-02-10 (×4): qty 100

## 2019-02-10 MED ORDER — BISACODYL 10 MG RE SUPP
10.0000 mg | Freq: Every day | RECTAL | Status: DC | PRN
  Administered 2019-02-12: 10 mg via RECTAL
  Filled 2019-02-10: qty 1

## 2019-02-10 SURGICAL SUPPLY — 74 items
ATTUNE MED DOME PAT 38 KNEE (Knees) ×2 IMPLANT
ATTUNE MED DOME PAT 38MM KNEE (Knees) ×1 IMPLANT
ATTUNE PS FEM LT SZ 5 CEM KNEE (Femur) ×3 IMPLANT
ATTUNE PSRP INSR SZ5 8 KNEE (Insert) ×2 IMPLANT
ATTUNE PSRP INSR SZ5 8MM KNEE (Insert) ×1 IMPLANT
BASE TIBIA ATTUNE KNEE SYS SZ6 (Knees) ×1 IMPLANT
BATTERY INSTRU NAVIGATION (MISCELLANEOUS) ×12 IMPLANT
BLADE SAW 70X12.5 (BLADE) ×3 IMPLANT
BLADE SAW 90X13X1.19 OSCILLAT (BLADE) ×3 IMPLANT
BLADE SAW 90X25X1.19 OSCILLAT (BLADE) ×3 IMPLANT
BONE CEMENT GENTAMICIN (Cement) ×6 IMPLANT
CANISTER SUCT 3000ML PPV (MISCELLANEOUS) ×3 IMPLANT
CEMENT BONE GENTAMICIN 40 (Cement) ×2 IMPLANT
CEMENT HV SMART SET (Cement) IMPLANT
COOLER POLAR GLACIER W/PUMP (MISCELLANEOUS) ×3 IMPLANT
COVER LIGHT HANDLE STERIS (MISCELLANEOUS) ×3 IMPLANT
COVER WAND RF STERILE (DRAPES) ×3 IMPLANT
CUFF TOURN SGL QUICK 30 (TOURNIQUET CUFF) ×2
CUFF TRNQT CYL 30X4X21-28X (TOURNIQUET CUFF) ×1 IMPLANT
DRAPE 3/4 80X56 (DRAPES) ×3 IMPLANT
DRSG DERMACEA 8X12 NADH (GAUZE/BANDAGES/DRESSINGS) ×6 IMPLANT
DRSG OPSITE POSTOP 4X14 (GAUZE/BANDAGES/DRESSINGS) ×3 IMPLANT
DRSG TEGADERM 4X4.75 (GAUZE/BANDAGES/DRESSINGS) ×3 IMPLANT
DURAPREP 26ML APPLICATOR (WOUND CARE) ×6 IMPLANT
ELECT REM PT RETURN 9FT ADLT (ELECTROSURGICAL) ×3
ELECTRODE REM PT RTRN 9FT ADLT (ELECTROSURGICAL) ×1 IMPLANT
EX-PIN ORTHOLOCK NAV 4X150 (PIN) ×6 IMPLANT
GAUZE SPONGE 4X4 12PLY STRL (GAUZE/BANDAGES/DRESSINGS) ×3 IMPLANT
GLOVE BIOGEL M STRL SZ7.5 (GLOVE) ×6 IMPLANT
GLOVE INDICATOR 8.0 STRL GRN (GLOVE) ×3 IMPLANT
GOWN STRL REUS W/ TWL LRG LVL3 (GOWN DISPOSABLE) ×2 IMPLANT
GOWN STRL REUS W/TWL LRG LVL3 (GOWN DISPOSABLE) ×4
HEMOVAC 400CC 10FR (MISCELLANEOUS) ×3 IMPLANT
HOLDER FOLEY CATH W/STRAP (MISCELLANEOUS) ×3 IMPLANT
HOOD PEEL AWAY FLYTE STAYCOOL (MISCELLANEOUS) ×6 IMPLANT
KIT TURNOVER KIT A (KITS) ×3 IMPLANT
KNIFE SCULPS 14X20 (INSTRUMENTS) ×3 IMPLANT
LABEL OR SOLS (LABEL) ×3 IMPLANT
MANIFOLD NEPTUNE II (INSTRUMENTS) ×3 IMPLANT
NDL SAFETY ECLIPSE 18X1.5 (NEEDLE) ×1 IMPLANT
NEEDLE HYPO 18GX1.5 SHARP (NEEDLE) ×2
NEEDLE SPNL 20GX3.5 QUINCKE YW (NEEDLE) ×6 IMPLANT
NS IRRIG 500ML POUR BTL (IV SOLUTION) ×3 IMPLANT
PACK TOTAL KNEE (MISCELLANEOUS) ×3 IMPLANT
PAD ABD DERMACEA PRESS 5X9 (GAUZE/BANDAGES/DRESSINGS) ×3 IMPLANT
PAD CAST CTTN 4X4 STRL (SOFTGOODS) ×1 IMPLANT
PAD WRAPON POLAR KNEE (MISCELLANEOUS) ×1 IMPLANT
PADDING CAST COTTON 4X4 STRL (SOFTGOODS) ×2
PENCIL SMOKE ULTRAEVAC 22 CON (MISCELLANEOUS) ×3 IMPLANT
PIN DRILL QUICK PACK ×3 IMPLANT
PIN FIXATION 1/8DIA X 3INL (PIN) ×9 IMPLANT
PULSAVAC PLUS IRRIG FAN TIP (DISPOSABLE) ×3
SOL .9 NS 3000ML IRR  AL (IV SOLUTION) ×2
SOL .9 NS 3000ML IRR UROMATIC (IV SOLUTION) ×1 IMPLANT
SOL PREP PVP 2OZ (MISCELLANEOUS) ×3
SOLUTION PREP PVP 2OZ (MISCELLANEOUS) ×1 IMPLANT
SPONGE DRAIN TRACH 4X4 STRL 2S (GAUZE/BANDAGES/DRESSINGS) ×3 IMPLANT
STAPLER SKIN PROX 35W (STAPLE) ×3 IMPLANT
STOCKINETTE BIAS CUT 6 980064 (GAUZE/BANDAGES/DRESSINGS) ×3 IMPLANT
STOCKINETTE IMPERV 14X48 (MISCELLANEOUS) ×3 IMPLANT
STRAP TIBIA SHORT (MISCELLANEOUS) ×3 IMPLANT
SUCTION FRAZIER HANDLE 10FR (MISCELLANEOUS) ×2
SUCTION TUBE FRAZIER 10FR DISP (MISCELLANEOUS) ×1 IMPLANT
SUT VIC AB 0 CT1 36 (SUTURE) ×6 IMPLANT
SUT VIC AB 1 CT1 36 (SUTURE) ×6 IMPLANT
SUT VIC AB 2-0 CT2 27 (SUTURE) ×3 IMPLANT
SYR 20ML LL LF (SYRINGE) ×3 IMPLANT
SYR 30ML LL (SYRINGE) ×6 IMPLANT
TIBIA ATTUNE KNEE SYS BASE SZ6 (Knees) ×3 IMPLANT
TIP FAN IRRIG PULSAVAC PLUS (DISPOSABLE) ×1 IMPLANT
TOWEL OR 17X26 4PK STRL BLUE (TOWEL DISPOSABLE) ×3 IMPLANT
TOWER CARTRIDGE SMART MIX (DISPOSABLE) ×3 IMPLANT
TRAY FOLEY MTR SLVR 16FR STAT (SET/KITS/TRAYS/PACK) ×3 IMPLANT
WRAPON POLAR PAD KNEE (MISCELLANEOUS) ×3

## 2019-02-10 NOTE — Anesthesia Preprocedure Evaluation (Addendum)
Anesthesia Evaluation  Patient identified by MRN, date of birth, ID band Patient awake    Reviewed: Allergy & Precautions, H&P , NPO status , Patient's Chart, lab work & pertinent test results  History of Anesthesia Complications History of anesthetic complications: "I need a lot of anesthesia"  Airway Mallampati: III  TM Distance: >3 FB Neck ROM: limited    Dental  (+) Chipped   Pulmonary shortness of breath and with exertion, asthma , COPD, Current Smoker and Patient abstained from smoking.,           Cardiovascular hypertension,      Neuro/Psych PSYCHIATRIC DISORDERS Anxiety Depression Bipolar Disorder sciatica    GI/Hepatic negative GI ROS, (+)     substance abuse  marijuana use,   Endo/Other  negative endocrine ROS  Renal/GU      Musculoskeletal   Abdominal   Peds  Hematology negative hematology ROS (+)   Anesthesia Other Findings Obese  Past Medical History: 08/30/2014: Acute right-sided low back pain with right-sided sciatica No date: Anxiety No date: Arthritis No date: Asthma     Comment:  in past, no current inhalers No date: Bipolar disorder ([place]) 05/07/2012: Cervical spinal cord compression (HCC) No date: COPD (chronic obstructive pulmonary disease) (HCC) No date: DDD (degenerative disc disease), lumbar No date: Depression No date: Dyspnea     Comment:  with exertion No date: Dysrhythmia     Comment:  irregular-h/o in the past No date: Fibromyalgia No date: Hypertension 05/23/2014: Neuritis or radiculitis due to rupture of lumbar  intervertebral disc 07/31/2015: Periprosthetic fracture around prosthetic knee No date: Restless leg syndrome 01/24/2012: Soft tissue lesion of shoulder region 09/18/2015: Status post revision of total replacement of right knee 02/01/2015: Status post right knee replacement 04/08/2014: Syncope and collapse 05/07/2012: Thoracic and lumbosacral neuritis 05/07/2012:  Thoracic neuritis  Past Surgical History: No date: ABDOMINAL HYSTERECTOMY No date: Cervical Fusion X 2 01/2016: JOINT REPLACEMENT; Right     Comment:  Dr Jefm Bryant No date: KNEE ARTHROSCOPY; Bilateral     Comment:  Right knee scope 1998, Left knee scope No date: TONSILLECTOMY 02/01/2015: TOTAL KNEE ARTHROPLASTY; Right     Comment:  Procedure: TOTAL KNEE ARTHROPLASTY;  Surgeon: Leanor Kail, MD;  Location: [facility] ORS;  Service: Orthopedics;              Laterality: Right; No date: TUBAL LIGATION     Reproductive/Obstetrics negative OB ROS                           Anesthesia Physical Anesthesia Plan  ASA: III  Anesthesia Plan: Spinal   Post-op Pain Management:    Induction:   PONV Risk Score and Plan:   Airway Management Planned: Natural Airway and Simple Face Mask  Additional Equipment:   Intra-op Plan:   Post-operative Plan:   Informed Consent: I have reviewed the patients History and Physical, chart, labs and discussed the procedure including the risks, benefits and alternatives for the proposed anesthesia with the patient or authorized representative who has indicated his/her understanding and acceptance.     Dental Advisory Given  Plan Discussed with: Anesthesiologist, CRNA and Surgeon  Anesthesia Plan Comments:         Anesthesia Quick Evaluation

## 2019-02-10 NOTE — Plan of Care (Signed)
  Problem: Education: Goal: Knowledge of General Education information will improve Description: Including pain rating scale, medication(s)/side effects and non-pharmacologic comfort measures Outcome: Progressing   Problem: Health Behavior/Discharge Planning: Goal: Ability to manage health-related needs will improve Outcome: Progressing   Problem: Clinical Measurements: Goal: Ability to maintain clinical measurements within normal limits will improve Outcome: Progressing Goal: Respiratory complications will improve Outcome: Progressing   Problem: Nutrition: Goal: Adequate nutrition will be maintained Outcome: Progressing   Problem: Coping: Goal: Level of anxiety will decrease Outcome: Progressing   Problem: Pain Managment: Goal: General experience of comfort will improve Outcome: Progressing   Problem: Safety: Goal: Ability to remain free from injury will improve Outcome: Progressing

## 2019-02-10 NOTE — Anesthesia Post-op Follow-up Note (Signed)
Anesthesia QCDR form completed.        

## 2019-02-10 NOTE — Op Note (Signed)
OPERATIVE NOTE  DATE OF SURGERY:  02/10/2019  PATIENT NAME:  Shelly Wright   DOB: [birth year]  MRN: [id number]  PRE-OPERATIVE DIAGNOSIS: Degenerative arthrosis of the left knee, primary  POST-OPERATIVE DIAGNOSIS:  Same  PROCEDURE:  Left total knee arthroplasty using computer-assisted navigation  SURGEON:  Jena Gauss. M.D.  ASSISTANT: Baldwin Jamaica, PA-C (present and scrubbed throughout the case, critical for assistance with exposure, retraction, instrumentation, and closure)  ANESTHESIA: spinal  ESTIMATED BLOOD LOSS: 50 mL  FLUIDS REPLACED: 1200 mL of crystalloid  TOURNIQUET TIME: 122 minutes  DRAINS: 2 medium Hemovac drains  SOFT TISSUE RELEASES: Anterior cruciate ligament, posterior cruciate ligament, deep medial collateral ligament, patellofemoral ligament  IMPLANTS UTILIZED: DePuy Attune size 5 posterior stabilized femoral component (cemented), size 6 rotating platform tibial component (cemented), 38 mm medialized dome patella (cemented), and an 8 mm stabilized rotating platform polyethylene insert.  INDICATIONS FOR SURGERY: Shelly Wright is a 48 y.o. year old female with a long history of progressive knee pain. X-rays demonstrated severe degenerative changes in tricompartmental fashion. The patient had not seen any significant improvement despite conservative nonsurgical intervention. After discussion of the risks and benefits of surgical intervention, the patient expressed understanding of the risks benefits and agree with plans for total knee arthroplasty.   The risks, benefits, and alternatives were discussed at length including but not limited to the risks of infection, bleeding, nerve injury, stiffness, blood clots, the need for revision surgery, cardiopulmonary complications, among others, and they were willing to proceed.  PROCEDURE IN DETAIL: The patient was brought into the operating room and, after adequate spinal anesthesia was achieved, a tourniquet was placed on  the patient's upper thigh. The patient's knee and leg were cleaned and prepped with alcohol and DuraPrep and draped in the usual sterile fashion. A "timeout" was performed as per usual protocol. The lower extremity was exsanguinated using an Esmarch, and the tourniquet was inflated to 300 mmHg. An anterior longitudinal incision was made followed by a standard mid vastus approach. The deep fibers of the medial collateral ligament were elevated in a subperiosteal fashion off of the medial flare of the tibia so as to maintain a continuous soft tissue sleeve. The patella was subluxed laterally and the patellofemoral ligament was incised. Inspection of the knee demonstrated severe degenerative changes with full-thickness loss of articular cartilage. Osteophytes were debrided using a rongeur. Anterior and posterior cruciate ligaments were excised. Two 4.0 mm Schanz pins were inserted in the femur and into the tibia for attachment of the array of trackers used for computer-assisted navigation. Hip center was identified using a circumduction technique. Distal landmarks were mapped using the computer. The distal femur and proximal tibia were mapped using the computer. The distal femoral cutting guide was positioned using computer-assisted navigation so as to achieve a 5 distal valgus cut. The femur was sized and it was felt that a size 5 femoral component was appropriate. A size 5 femoral cutting guide was positioned and the anterior cut was performed and verified using the computer. This was followed by completion of the posterior and chamfer cuts. Femoral cutting guide for the central box was then positioned in the center box cut was performed.  Attention was then directed to the proximal tibia. Medial and lateral menisci were excised. The extramedullary tibial cutting guide was positioned using computer-assisted navigation so as to achieve a 0 varus-valgus alignment and 3 posterior slope. The cut was performed and  verified using the computer. The proximal tibia was  sized and it was felt that a size 6 tibial tray was appropriate. Tibial and femoral trials were inserted followed by insertion of an 8 mm polyethylene insert. This allowed for excellent mediolateral soft tissue balancing both in flexion and in full extension. Finally, the patella was cut and prepared so as to accommodate a 38 mm medialized dome patella. A patella trial was placed and the knee was placed through a range of motion with excellent patellar tracking appreciated. The femoral trial was removed after debridement of posterior osteophytes. The central post-hole for the tibial component was reamed followed by insertion of a keel punch. Tibial trials were then removed. Cut surfaces of bone were irrigated with copious amounts of normal saline with antibiotic solution using pulsatile lavage and then suctioned dry. Polymethylmethacrylate cement with gentamicin was prepared in the usual fashion using a vacuum mixer. Cement was applied to the cut surface of the proximal tibia as well as along the undersurface of a size 6 rotating platform tibial component. Tibial component was positioned and impacted into place. Excess cement was removed using [employer] fast streamer. Cement was then applied to the cut surfaces of the femur as well as along the posterior flanges of the size 5 femoral component. The femoral component was positioned and impacted into place. Excess cement was removed using [employer] fast streamer. An 8 mm polyethylene trial was inserted and the knee was brought into full extension with steady axial compression applied. Finally, cement was applied to the backside of a 38 mm medialized dome patella and the patellar component was positioned and patellar clamp applied. Excess cement was removed using [employer] fast streamer. After adequate curing of the cement, the tourniquet was deflated after a total tourniquet time of 122 minutes. Hemostasis was achieved using electrocautery.  The knee was irrigated with copious amounts of normal saline with antibiotic solution using pulsatile lavage and then suctioned dry. 20 mL of 1.3% Exparel and 60 mL of 0.25% Marcaine in 40 mL of normal saline was injected along the posterior capsule, medial and lateral gutters, and along the arthrotomy site. An 8 mm stabilized rotating platform polyethylene insert was inserted and the knee was placed through a range of motion with excellent mediolateral soft tissue balancing appreciated and excellent patellar tracking noted. 2 medium drains were placed in the wound bed and brought out through separate stab incisions. The medial parapatellar portion of the incision was reapproximated using interrupted sutures of #1 Vicryl. Subcutaneous tissue was approximated in layers using first #0 Vicryl followed #2-0 Vicryl. The skin was approximated with skin staples. A sterile dressing was applied.  The patient tolerated the procedure well and was transported to the recovery room in stable condition.    Priyanka Causey P. Holley Bouche., M.D.

## 2019-02-10 NOTE — H&P (Signed)
The patient has been re-examined, and the chart reviewed, and there have been no interval changes to the documented history and physical.    The risks, benefits, and alternatives have been discussed at length. The patient expressed understanding of the risks benefits and agreed with plans for surgical intervention.  James P. Hooten, Jr. M.D.    

## 2019-02-10 NOTE — Progress Notes (Signed)
Patient would like nicotine patch, MD paged.

## 2019-02-10 NOTE — Progress Notes (Signed)
Patient resting in bed, pain meds given with some relief. Dressing on left knee clean, dry, and intact. Bone foam in place, polar care on, teds and foot pumps in place. Nicotine patch placed on left arm. IVF infusing. Foley patent. No complaints or needs at this time. Bed lowest position, alarm on, call bell in reach. Continue to monitor.

## 2019-02-10 NOTE — Telephone Encounter (Signed)
Spoke with Shelly Wright at [place] [place]. Pt needs to have a PFT prior to having surgery done. Advised Shelly Wright that we do not have any openings at this time for PFT. I have given her the contact information for [facility] to see if they can accommodate the pt. Nothing further was needed.

## 2019-02-10 NOTE — Anesthesia Procedure Notes (Signed)
Spinal  Patient location during procedure: OR Start time: 02/10/2019 11:00 AM End time: 02/10/2019 11:10 AM Staffing Anesthesiologist: Durenda Hurt, MD Resident/CRNA: Caryl Asp, CRNA Performed: resident/CRNA and anesthesiologist  Preanesthetic Checklist Completed: patient identified, site marked, surgical consent, pre-op evaluation, timeout performed, IV checked, risks and benefits discussed and monitors and equipment checked Spinal Block Patient position: sitting Prep: ChloraPrep Patient monitoring: heart rate, cardiac monitor, continuous pulse ox and blood pressure Approach: midline Location: L3-4 Injection technique: single-shot Needle Needle type: Sprotte  Needle gauge: 24 G Needle length: 9 cm Assessment Sensory level: T4 Additional Notes Positive free-flowing CSF. Negative paresthesia on injection.  Patient tolerated procedure well, without complications.

## 2019-02-10 NOTE — Transfer of Care (Signed)
Immediate Anesthesia Transfer of Care Note  Patient: Shelly Wright  Procedure(s) Performed: COMPUTER ASSISTED TOTAL KNEE ARTHROPLASTY - RNFA (Left Knee)  Patient Location: PACU  Anesthesia Type:General  Level of Consciousness: awake, alert  and oriented  Airway & Oxygen Therapy: Patient Spontanous Breathing  Post-op Assessment: Report given to RN and Post -op Vital signs reviewed and stable  Post vital signs: Reviewed and stable  Last Vitals:  Vitals Value Taken Time  BP 107/71 02/10/19 1528  Temp    Pulse 73 02/10/19 1529  Resp 25 02/10/19 1529  SpO2 94 % 02/10/19 1529  Vitals shown include unvalidated device data.  Last Pain:  Vitals:   02/10/19 0911  TempSrc: Tympanic  PainSc: 8       Patients Stated Pain Goal: 3 (62/83/66 2947)  Complications: No apparent anesthesia complications

## 2019-02-11 ENCOUNTER — Other Ambulatory Visit (HOSPITAL_COMMUNITY): Payer: Self-pay | Admitting: Respiratory Therapy

## 2019-02-11 ENCOUNTER — Encounter: Payer: Self-pay | Admitting: Orthopedic Surgery

## 2019-02-11 DIAGNOSIS — J441 Chronic obstructive pulmonary disease with (acute) exacerbation: Secondary | ICD-10-CM

## 2019-02-11 MED ORDER — CELECOXIB 200 MG PO CAPS: 200.0000 mg | ORAL_CAPSULE | Freq: Two times a day (BID) | ORAL | 1 refills | Status: DC

## 2019-02-11 MED ORDER — ENOXAPARIN SODIUM 40 MG/0.4ML ~~LOC~~ SOLN: 40.0000 mg | SUBCUTANEOUS | 0 refills | Status: DC

## 2019-02-11 MED ORDER — OXYCODONE HCL 5 MG PO TABS: 5.0000 mg | ORAL_TABLET | ORAL | 0 refills | Status: DC | PRN

## 2019-02-11 MED ORDER — TRAMADOL HCL 50 MG PO TABS: 50.0000 mg | ORAL_TABLET | ORAL | 1 refills | Status: DC | PRN

## 2019-02-11 NOTE — Evaluation (Signed)
Physical Therapy Evaluation Patient Details Name: Shelly Wright MRN: [id number] DOB: February 07, 1971 Today's Date: 02/11/2019   History of Present Illness  Pt is a 48 y.o female with a long history of progressive knee pain with severe tricompartmental degenerative changes and is now s/p L TKA (09/23). Pt with PMH including COPD, asthma, HTN, SOB with exertion, anxiety, depression, substance abuse, R TKA (2016) and cervical fusion (unknown date).  Clinical Impression  Pt is a pleasant 48 y/o female s/p L TKA (02/10/2019) secondary to degenerative changes and progressive knee pain. Pt was previously independent with use of single point cane for household and community ambulation. Pt performs bed mobility with supervision today for safety, transfers sit to stand with CGA and ambulates 200' with RW and CGA. Cuing provided for transfers and ambulation for RW management to maintain continuous forward motion and close proximity with turns. Pt negotiates 4 stairs with L handrail requiring CGA for safety, demonstrating step to pattern and appropriate sequencing with minimal cuing. Pt's HR and O2 monitored intermittently during activities remaining >93%; pt instructed in pursed lip breathing strategy throughout to ensure sufficient O2 sat.     Follow Up Recommendations Outpatient PT    Equipment Recommendations  Rolling walker with 5" wheels    Recommendations for Other Services       Precautions / Restrictions Precautions Precautions: Fall Restrictions Weight Bearing Restrictions: Yes LLE Weight Bearing: Weight bearing as tolerated      Mobility  Bed Mobility Overal bed mobility: Needs Assistance Bed Mobility: Supine to Sit     Supine to sit: Supervision        Transfers Overall transfer level: Needs assistance Equipment used: Rolling walker (2 wheeled) Transfers: Sit to/from Stand Sit to Stand: Min guard            Ambulation/Gait Ambulation/Gait assistance: Min guard Gait  Distance (Feet): 200 Feet Assistive device: Rolling walker (2 wheeled) Gait Pattern/deviations: Step-through pattern;Decreased stance time - left     General Gait Details: Cuing for RW management (for continuous forward propulsion)  Stairs Stairs: Yes Stairs assistance: Min guard Stair Management: One rail Left Number of Stairs: 4 General stair comments: Step-to pattern; pt able to verbalize appropriate sequencing pattern  Wheelchair Mobility    Modified Rankin (Stroke Patients Only)       Balance Overall balance assessment: Needs assistance Sitting-balance support: Feet supported Sitting balance-Leahy Scale: Good       Standing balance-Leahy Scale: Good                               Pertinent Vitals/Pain Pain Assessment: 0-10 Pain Score: 4  Pain Descriptors / Indicators: Discomfort Pain Intervention(s): Limited activity within patient's tolerance;Premedicated before session;Ice applied    Home Living Family/patient expects to be discharged to:: Private residence Living Arrangements: Other relatives(nephew and his son) Available Help at Discharge: Family;Available PRN/intermittently Type of Home: House Home Access: Stairs to enter Entrance Stairs-Rails: Left Entrance Stairs-Number of Steps: 4-5 Home Layout: One level Home Equipment: Cane - single point      Prior Function Level of Independence: Independent with assistive device(s)         Comments: single point cane, household and community ambulation     Hand Dominance        Extremity/Trunk Assessment   Upper Extremity Assessment Upper Extremity Assessment: Overall WFL for tasks assessed    Lower Extremity Assessment Lower Extremity Assessment: Overall WFL for tasks assessed(RLE  with genu varus (history of R TKA 2016))    Cervical / Trunk Assessment Cervical / Trunk Assessment: Normal  Communication   Communication: No difficulties  Cognition Arousal/Alertness:  Awake/alert Behavior During Therapy: WFL for tasks assessed/performed Overall Cognitive Status: Within Functional Limits for tasks assessed                                        General Comments      Exercises Total Joint Exercises Ankle Circles/Pumps: AROM;Both;20 reps Quad Sets: Strengthening;Left;10 reps Hip ABduction/ADduction: AROM;Left;10 reps Goniometric ROM: L knee: 0 to 85   Assessment/Plan    PT Assessment Patient needs continued PT services  PT Problem List Decreased strength;Decreased mobility;Decreased range of motion;Decreased activity tolerance;Decreased balance;Pain       PT Treatment Interventions DME instruction;Therapeutic exercise;Gait training;Balance training;Stair training;Neuromuscular re-education;Functional mobility training;Therapeutic activities;Patient/family education    PT Goals (Current goals can be found in the Care Plan section)  Acute Rehab PT Goals Patient Stated Goal: to go home PT Goal Formulation: With patient Time For Goal Achievement: 02/26/19 Potential to Achieve Goals: Good    Frequency BID   Barriers to discharge        Co-evaluation               AM-PAC PT "6 Clicks" Mobility  Outcome Measure Help needed turning from your back to your side while in a flat bed without using bedrails?: None Help needed moving from lying on your back to sitting on the side of a flat bed without using bedrails?: None Help needed moving to and from a bed to a chair (including a wheelchair)?: A Little Help needed standing up from a chair using your arms (e.g., wheelchair or bedside chair)?: A Little Help needed to walk in hospital room?: A Little Help needed climbing 3-5 steps with a railing? : A Little 6 Click Score: 20    End of Session Equipment Utilized During Treatment: Gait belt Activity Tolerance: Patient tolerated treatment well Patient left: in chair;with call bell/phone within reach;with chair alarm set;with  SCD's reapplied Nurse Communication: Mobility status PT Visit Diagnosis: Unsteadiness on feet (R26.81);Difficulty in walking, not elsewhere classified (R26.2);Pain Pain - Right/Left: Left Pain - part of body: Knee(4/10, not increasing with ambulation)    Time: 8295-6213 PT Time Calculation (min) (ACUTE ONLY): 34 min   Charges:   PT Evaluation $PT Eval Moderate Complexity: 1 Mod PT Treatments $Therapeutic Exercise: 8-22 mins      Petra Kuba, PT, DPT 02/11/19, 9:56 AM

## 2019-02-11 NOTE — TOC Progression Note (Signed)
Transition of Care [facility]) - Progression Note    Patient Details  Name: Shelly Wright MRN: [id number] Date of Birth: May 01, 1971  Transition of Care [facility]) CM/SW Contact  Su Hilt, RN Phone Number: 02/11/2019, 9:55 AM  Clinical Narrative:    Requested the price for Lovenox 40 mg, will notify the patient of the price once obtained        Expected Discharge Plan and Services                                                 Social Determinants of Health (SDOH) Interventions    Readmission Risk Interventions No flowsheet data found.

## 2019-02-11 NOTE — Progress Notes (Signed)
Patient dangled in bed with no issues. Tolerated well.

## 2019-02-11 NOTE — TOC Initial Note (Signed)
Transition of Care [facility]) - Initial/Assessment Note    Patient Details  Name: Shelly Wright MRN: [id number] Date of Birth: 1970-08-25  Transition of Care [facility]) CM/SW Contact:    Su Hilt, RN Phone Number: 02/11/2019, 2:19 PM  Clinical Narrative:                 Met with the patient to discuss the DC plan and needs She lives at home and her Nephew and great Nephew live with her She does not have DME at home I notified Brad with [employer] that the patient needs a 3 in 1 and a RW She is recommended to go to Outpatient PT and she stated she would be unable to do that She requested [facility] PT I sent the referral to [facility] with [facility]  With Medicaid the Lovenox should be $3.00 She goes to [facility] Primary care and is up to date with visits She uses [facility]   Expected Discharge Plan: [place] Barriers to Discharge: Continued Medical Work up   Patient Goals and CMS Choice Patient states their goals for this hospitalization and ongoing recovery are:: go home with [facility] CMS Medicare.gov Compare Post Acute Care list provided to:: Patient Choice offered to / list presented to : Patient  Expected Discharge Plan and Services Expected Discharge Plan: [place]   Discharge Planning Services: CM Consult Post Acute Care Choice: [place] arrangements for the past 2 months: Single Family Home                 DME Arranged: 3-N-1, Walker rolling DME Agency: [employer] Date DME Agency Contacted: 02/11/19   Representative spoke with at DME Agency: [place]: PT [place]: [place] ([place]) Date [place]: 02/11/19 Time [place]: 11 Representative spoke with at [place]: Corene Cornea  Prior Living Arrangements/Services Living arrangements for the past 2 months: [place] Lives with:: Relatives Patient language and need for interpreter reviewed:: Yes Do you feel safe going back to the place  where you live?: Yes      Need for Family Participation in Patient Care: No (Comment) Care giver support system in place?: Yes (comment)   Criminal Activity/Legal Involvement Pertinent to Current Situation/Hospitalization: No - Comment as needed  Activities of Daily Living Home Assistive Devices/Equipment: Cane (specify quad or straight) ADL Screening (condition at time of admission) Patient's cognitive ability adequate to safely complete daily activities?: Yes Is the patient deaf or have difficulty hearing?: No Does the patient have difficulty seeing, even when wearing glasses/contacts?: No Does the patient have difficulty concentrating, remembering, or making decisions?: No Patient able to express need for assistance with ADLs?: Yes Does the patient have difficulty dressing or bathing?: No Independently performs ADLs?: Yes (appropriate for developmental age) Does the patient have difficulty walking or climbing stairs?: Yes Weakness of Legs: Left Weakness of Arms/Hands: None  Permission Sought/Granted   Permission granted to share information with : Yes, Verbal Permission Granted              Emotional Assessment Appearance:: Appears stated age Attitude/Demeanor/Rapport: Engaged Affect (typically observed): Appropriate Orientation: : Oriented to Self, Oriented to Place, Oriented to  Time, Oriented to Situation Alcohol / Substance Use: Not Applicable Psych Involvement: No (comment)  Admission diagnosis:  PRIMARY OSTEOARTHRITIS OF LEFT KNEE. Patient Active Problem List   Diagnosis Date Noted  . Total knee replacement status 02/10/2019  . Panic attacks 02/03/2019  .  GAD (generalized anxiety disorder) 11/25/2018  . Cannabis use disorder, moderate, dependence ([place]) 11/25/2018  . Tobacco use disorder 11/25/2018  . Chronic musculoskeletal pain 07/29/2018  . Hypercholesteremia 08/26/2017  . 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 Chronic sacroiliac joint  pain (Primary Source of Pain) (Bilateral) (L>R) 07/09/2016  . Lumbar facet syndrome (Bilateral) (L>R) 07/09/2016  . Neurogenic pain 07/09/2016  . Osteoarthritis 07/09/2016  . Chronic knee pain after total knee replacement (Right) 07/09/2016  . Marijuana use 06/25/2016  . Bipolar I disorder, most recent episode depressed ([place]) 05/06/2016  . BP (high blood pressure) 05/06/2016  . Irregular cardiac rhythm 05/06/2016  . Extreme obesity 05/06/2016  . Long term current use of anticoagulant therapy (Lovenox) 05/06/2016  . Long term current use of opiate analgesic 05/06/2016  . Long term prescription opiate use 05/06/2016  . Opiate use 05/06/2016  . Opioid dependence, uncomplicated ([place]) 10/71/2524  . Chronic hip pain (Primary Area of Pain) (Bilateral) (L>R) 05/06/2016  . Chronic neck pain (Bilateral) (L>R) 05/06/2016  . Chronic knee pain (Bilateral) (L>R) 05/06/2016  . Radiculopathy of cervical region (B)(R>L)(Thumb + Index fingers) 12/21/2015  . Chronic shoulder pain (Third area of Pain) (Bilateral) (L>R) 12/21/2015  . S/P TKR Arthroplasty (Right) 07/13/2015  . Osteoarthritis of knee (Left) 11/17/2014  . DDD (degenerative disc disease), lumbar 08/30/2014  . Lumbar canal stenosis (L3-4 and L4-5) 05/23/2014  . Anxiety and depression 04/25/2014  . Fibromyalgia 04/25/2014  . Personal history of perinatal problems 04/25/2014  . Chronic low back pain (Secondary area of Pain) (Bilateral) (L>R) 04/25/2014  . Vitamin D deficiency 04/25/2014  . H/O syncope 04/25/2014  . Hyperlipidemia 04/25/2014  . Breathlessness on exertion 04/20/2014  . PVC's (premature ventricular contractions) 04/08/2014  . Asthma 03/26/2014  . Cardiac conduction disorder 03/26/2014  . Hypercholesterolemia without hypertriglyceridemia 03/26/2014  . Chronic pain syndrome 12/30/2013  . Cervical post-laminectomy syndrome (Anterior-posterior approach) (2014) 12/30/2013  . Major depressive disorder, single episode 05/08/2013  .  Depression 05/08/2013  . Thoracic or lumbosacral neuritis or radiculitis 05/07/2012   PCP:  [facility]:   [place], [place] [address] [place] [address] Phone: [phone] Fax: [phone]     Social Determinants of Health (SDOH) Interventions    Readmission Risk Interventions No flowsheet data found.

## 2019-02-11 NOTE — TOC Progression Note (Signed)
Transition of Care [facility]) - Progression Note    Patient Details  Name: Shelly Wright MRN: [id number] Date of Birth: Nov 17, 1970  Transition of Care [facility]) CM/SW Contact  Su Hilt, RN Phone Number: 02/11/2019, 3:34 PM  Clinical Narrative:    Corene Cornea with [facility] notified me that they would be unable to accept the patient, I sent the referral to [facility]   Expected Discharge Plan: [place] Barriers to Discharge: Continued Medical Work up  Expected Discharge Plan and Services Expected Discharge Plan: [place]   Discharge Planning Services: CM Consult Post Acute Care Choice: [place] arrangements for the past 2 months: Single Family Home                 DME Arranged: 3-N-1, Walker rolling DME Agency: [employer] Date DME Agency Contacted: 02/11/19   Representative spoke with at DME Agency: [place]: PT [place]: [place] ([place]) Date [place]: 02/11/19 Time [place]: 1418 Representative spoke with at [place]: [place] ([place]) Interventions    Readmission Risk Interventions No flowsheet data found.

## 2019-02-11 NOTE — Progress Notes (Signed)
Physical Therapy Treatment Patient Details Name: Collier Monica Wynter MRN: [id number] DOB: 06-Tullius-1972 Today's Date: 02/11/2019    History of Present Illness Pt is a 48 y.o female with a long history of progressive knee pain with severe tricompartmental degenerative changes. Pt is s/p L TKA (09/23). Pt with PMH including COPD, asthma, HTN, SOB with exertion, anxiety, depression, substance abuse, R TKA (2016) and cervical fusion (unknown date).    PT Comments    Pt tolerates session well, reporting moderate discomfort at start of session (4.5/10) though not increasing with activity. Pt requiring supervision with bed mobility and CGA with transfers and ambulation for safety. Pt ambulates 250' with RW with CGA demonstrating improved RW management with continuous forward propulsion and turns, and able to improve L knee flexion during swing phase with cuing. Therapeutic exercise performed for strengthening and improved range of motion, with pt able to perform with minimal increased discomfort. Pt will continue to benefit from skilled PT intervention to improve balance, range of motion, strength and pain to promote return to prior level of function.  Follow Up Recommendations  Outpatient PT     Equipment Recommendations  Rolling walker with 5" wheels    Recommendations for Other Services       Precautions / Restrictions Precautions Precautions: Fall Restrictions Weight Bearing Restrictions: Yes LLE Weight Bearing: Weight bearing as tolerated    Mobility  Bed Mobility Overal bed mobility: Needs Assistance Bed Mobility: Supine to Sit     Supine to sit: Supervision        Transfers Overall transfer level: Needs assistance Equipment used: Rolling walker (2 wheeled) Transfers: Sit to/from Stand Sit to Stand: Min guard            Ambulation/Gait Ambulation/Gait assistance: Min guard Gait Distance (Feet): 250 Feet Assistive device: Rolling walker (2 wheeled) Gait Pattern/deviations:  Step-through pattern;Decreased stance time - left     General Gait Details: Improved RW management; cuing for hand placement when transferring sit<>stand   Stairs             Wheelchair Mobility    Modified Rankin (Stroke Patients Only)       Balance Overall balance assessment: Needs assistance Sitting-balance support: Feet supported;No upper extremity supported Sitting balance-Leahy Scale: Good     Standing balance support: Bilateral upper extremity supported Standing balance-Leahy Scale: Good                              Cognition Arousal/Alertness: Awake/alert Behavior During Therapy: WFL for tasks assessed/performed Overall Cognitive Status: Within Functional Limits for tasks assessed                                        Exercises Total Joint Exercises Ankle Circles/Pumps: AROM;Both;20 reps;Supine Quad Sets: Strengthening;Left;Supine(12 reps) Heel Slides: AROM;Left;10 reps;Supine Hip ABduction/ADduction: AROM;Left;10 reps;Supine Long Arc Quad: Strengthening;Left;10 reps;Seated    General Comments        Pertinent Vitals/Pain Pain Assessment: 0-10 Pain Score: 4  Pain Location: L knee Pain Descriptors / Indicators: Sore;Discomfort Pain Intervention(s): Limited activity within patient's tolerance;Ice applied;Repositioned    Home Living                      Prior Function            PT Goals (current goals can now be found in  the care plan section) Acute Rehab PT Goals Patient Stated Goal: to go home PT Goal Formulation: With patient Time For Goal Achievement: 02/26/19 Potential to Achieve Goals: Good Progress towards PT goals: Progressing toward goals    Frequency    BID      PT Plan Current plan remains appropriate    Co-evaluation              AM-PAC PT "6 Clicks" Mobility   Outcome Measure  Help needed turning from your back to your side while in a flat bed without using bedrails?:  None Help needed moving from lying on your back to sitting on the side of a flat bed without using bedrails?: None Help needed moving to and from a bed to a chair (including a wheelchair)?: A Little Help needed standing up from a chair using your arms (e.g., wheelchair or bedside chair)?: A Little Help needed to walk in hospital room?: A Little Help needed climbing 3-5 steps with a railing? : A Little 6 Click Score: 20    End of Session Equipment Utilized During Treatment: Gait belt Activity Tolerance: Patient tolerated treatment well Patient left: in bed;with call bell/phone within reach;with bed alarm set;with SCD's reapplied Nurse Communication: Mobility status PT Visit Diagnosis: Unsteadiness on feet (R26.81);Difficulty in walking, not elsewhere classified (R26.2);Pain Pain - Right/Left: Left Pain - part of body: Knee     Time: 1455-1519 PT Time Calculation (min) (ACUTE ONLY): 24 min  Charges:  $Gait Training: 8-22 mins $Therapeutic Exercise: 8-22 mins                        Kendal Hymen, PT, DPT 02/11/19, 5:22 PM

## 2019-02-11 NOTE — Progress Notes (Signed)
  Subjective: 1 Day Post-Op Procedure(s) (LRB): COMPUTER ASSISTED TOTAL KNEE ARTHROPLASTY - RNFA (Left) Patient reports pain as mild.   Patient seen in rounds with Dr. Marry Guan. Patient is well, and has had no acute complaints or problems Plan is to go Home after hospital stay. Negative for chest pain and shortness of breath Fever: no Gastrointestinal: Negative for nausea and vomiting  Objective: Vital signs in last 24 hours: Temp:  [97 F (36.1 C)-98.3 F (36.8 C)] 97.8 F (36.6 C) (09/24 0354) Pulse Rate:  [54-76] 66 (09/24 0354) Resp:  [16-23] 19 (09/24 0354) BP: (93-141)/(52-95) 115/75 (09/24 0354) SpO2:  [93 %-99 %] 96 % (09/24 0354)  Intake/Output from previous day:  Intake/Output Summary (Last 24 hours) at 02/11/2019 0623 Last data filed at 02/11/2019 0603 Gross per 24 hour  Intake 1834.33 ml  Output 2090 ml  Net -255.67 ml    Intake/Output this shift: Total I/O In: 405.1 [I.V.:5.1; IV Piggyback:400] Out: 800 [Urine:600; Drains:200]  Labs: No results for input(s): HGB in the last 72 hours. No results for input(s): WBC, RBC, HCT, PLT in the last 72 hours. No results for input(s): NA, K, CL, CO2, BUN, CREATININE, GLUCOSE, CALCIUM in the last 72 hours. No results for input(s): LABPT, INR in the last 72 hours.   EXAM General - Patient is Alert and Oriented Extremity - Neurovascular intact Sensation intact distally Dorsiflexion/Plantar flexion intact Compartment soft Dressing/Incision - clean, dry, with the Hemovac intact Motor Function - intact, moving foot and toes well on exam.   Past Medical History:  Diagnosis Date  . Acute right-sided low back pain with right-sided sciatica 08/30/2014  . Anxiety   . Arthritis   . Asthma    in past, no current inhalers  . Bipolar disorder ([place])   . Cervical spinal cord compression ([place]) 05/07/2012  . COPD (chronic obstructive pulmonary disease) ([place])   . DDD (degenerative disc disease), lumbar   . Depression   .  Dyspnea    with exertion  . Dysrhythmia    irregular-h/o in the past  . Fibromyalgia   . Hypertension   . Neuritis or radiculitis due to rupture of lumbar intervertebral disc 05/23/2014  . Periprosthetic fracture around prosthetic knee 07/31/2015  . Restless leg syndrome   . Soft tissue lesion of shoulder region 01/24/2012  . 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 Syncope and collapse 04/08/2014  . Thoracic and lumbosacral neuritis 05/07/2012  . Thoracic neuritis 05/07/2012    Assessment/Plan: 1 Day Post-Op Procedure(s) (LRB): COMPUTER ASSISTED TOTAL KNEE ARTHROPLASTY - RNFA (Left) Active Problems:   Total knee replacement status  Estimated body mass index is 37.5 kg/m as calculated from the following:   Height as of 02/02/19: 5\' 6"  (1.676 m).   Weight as of 02/02/19: 105.4 kg. Advance diet Up with therapy D/C IV fluids Discharge home with home health planned for tomorrow  DVT Prophylaxis - Lovenox, Foot Pumps and TED hose Weight-Bearing as tolerated to left leg  Reche Dixon, PA-C Orthopaedic Surgery 02/11/2019, 6:23 AM

## 2019-02-11 NOTE — Evaluation (Signed)
Occupational Therapy Evaluation Patient Details Name: Shelly Wright MRN: [id number] DOB: 02/15/1971 Today's Date: 02/11/2019    History of Present Illness Pt is a 48 y.o female with a long history of progressive knee pain with severe tricompartmental degenerative changes. Pt is s/p L TKA (09/23). Pt with PMH including COPD, asthma, HTN, SOB with exertion, anxiety, depression, substance abuse, R TKA (2016) and cervical fusion (unknown date).   Clinical Impression   Shelly Wright was seen for OT evaluation this date, POD#1 from above surgery. Pt was independent in all ADLs prior to surgery, however occasionally using a SPC for community distances 2/2 L knee and back pain. Pt is eager to return to PLOF with less pain and improved safety and independence. Pt currently requires minimal assist for LB dressing while in seated position due to pain and limited AROM of L knee. Pt instructed in polar care mgt, falls prevention strategies, home/routines modifications, DME/AE for LB bathing and dressing tasks, compression stocking mgt, and considerations for pet care upon hospital DC. Handout provided. Pt would benefit from skilled OT services including additional instruction in dressing techniques with or without assistive devices for dressing and bathing skills to support recall and carryover prior to discharge and ultimately to maximize safety, independence, and minimize falls risk and caregiver burden. Do not currently anticipate any OT needs following this hospitalization.       Follow Up Recommendations  No OT follow up    Equipment Recommendations  3 in 1 bedside commode    Recommendations for Other Services       Precautions / Restrictions Precautions Precautions: Fall Restrictions Weight Bearing Restrictions: Yes LLE Weight Bearing: Weight bearing as tolerated      Mobility Bed Mobility Overal bed mobility: Needs Assistance Bed Mobility: Supine to Sit     Supine to sit: Supervision         Transfers Overall transfer level: Needs assistance Equipment used: Rolling walker (2 wheeled) Transfers: Sit to/from Stand Sit to Stand: Min guard              Balance Overall balance assessment: Needs assistance Sitting-balance support: Feet supported;No upper extremity supported Sitting balance-Leahy Scale: Good Sitting balance - Comments: steady sitting, reaching within BOS. No LOB noted with dynamic weight shift.     Standing balance-Leahy Scale: Good                             ADL either performed or assessed with clinical judgement   ADL Overall ADL's : Needs assistance/impaired                                       General ADL Comments: Pt req. set-up to min assist for LB ADL mgt. She is able to doff/don B socks in long sitting in recliner without need for AD this date. Pt recieved wearing street clothes for LB. She states nsg staff assisted her with getting them over her legs. She performs UB dressing including donning bra and shirt independently. OT demonstrated use of LHR for LB dressing. Pt min assist for LB bathing due to increased pain and decreased ROM of the L knee.     Vision Baseline Vision/History: Wears glasses Wears Glasses: Reading only Patient Visual Report: No change from baseline       Perception     Praxis  Pertinent Vitals/Pain Pain Assessment: 0-10 Pain Score: 3  Pain Descriptors / Indicators: Sore;Discomfort Pain Intervention(s): Limited activity within patient's tolerance;Premedicated before session;Ice applied     Hand Dominance Right   Extremity/Trunk Assessment Upper Extremity Assessment Upper Extremity Assessment: Overall WFL for tasks assessed(BUE grossly 4/5-WFL.)   Lower Extremity Assessment Lower Extremity Assessment: LLE deficits/detail;Defer to PT evaluation LLE Deficits / Details: s/p L TKA   Cervical / Trunk Assessment Cervical / Trunk Assessment: Normal   Communication  Communication Communication: No difficulties   Cognition Arousal/Alertness: Awake/alert Behavior During Therapy: WFL for tasks assessed/performed Overall Cognitive Status: Within Functional Limits for tasks assessed                                     General Comments  wound vac and polar care in place at start/end of session.    Exercises Total Joint Exercises Ankle Circles/Pumps: AROM;Both;20 reps Quad Sets: Strengthening;Left;10 reps Hip ABduction/ADduction: AROM;Left;10 reps Goniometric ROM: L knee: 0 to 85 Other Exercises Other Exercises: Pt educated in falls prevention strategies, safe use of AE/DME for LB ADL mgt and functional mobility, compression stocking mgt, pet care mgt, and routines modifications to support safety and independence upon hospital DC.   Shoulder Instructions      Home Living Family/patient expects to be discharged to:: Private residence Living Arrangements: Other relatives(Cousin is coming to help out during recovery) Available Help at Discharge: Family;Available PRN/intermittently Type of Home: House Home Access: Stairs to enter Entergy CorporationEntrance Stairs-Number of Steps: 4-5 Entrance Stairs-Rails: Left Home Layout: One level     Bathroom Shower/Tub: Producer, television/film/videoWalk-in shower   Bathroom Toilet: Standard Bathroom Accessibility: Yes How Accessible: Accessible via walker Home Equipment: Cane - single point;Hand held shower head          Prior Functioning/Environment Level of Independence: Independent with assistive device(s)        Comments: Single point cane, household and community ambulation. I for ADL/IADL mgt.        OT Problem List: Decreased strength;Decreased coordination;Pain;Decreased range of motion;Decreased activity tolerance;Decreased safety awareness;Decreased knowledge of use of DME or AE;Decreased knowledge of precautions      OT Treatment/Interventions: Self-care/ADL training;Balance training;Therapeutic exercise;Therapeutic  activities;Energy conservation;DME and/or AE instruction;Patient/family education    OT Goals(Current goals can be found in the care plan section) Acute Rehab OT Goals Patient Stated Goal: to go home OT Goal Formulation: With patient Time For Goal Achievement: 02/25/19 ADL Goals Pt Will Perform Lower Body Bathing: with modified independence;sit to/from stand(With LRAD PRN for improved safety and functional independence.) Pt Will Perform Lower Body Dressing: with modified independence;with adaptive equipment;sit to/from stand(With LRAD PRN for improved safety and functional independence.)  OT Frequency: Min 1X/week   Barriers to D/C:            Co-evaluation              AM-PAC OT "6 Clicks" Daily Activity     Outcome Measure Help from another person eating meals?: None Help from another person taking care of personal grooming?: None Help from another person toileting, which includes using toliet, bedpan, or urinal?: A Little Help from another person bathing (including washing, rinsing, drying)?: A Little Help from another person to put on and taking off regular upper body clothing?: None Help from another person to put on and taking off regular lower body clothing?: A Little 6 Click Score: 21   End of Session Equipment  Utilized During Treatment: Gait belt;Rolling walker  Activity Tolerance: Patient tolerated treatment well Patient left: in chair;with call bell/phone within reach;with chair alarm set;with SCD's reapplied;Other (comment)(With polar care in place.)  OT Visit Diagnosis: Other abnormalities of gait and mobility (R26.89);Pain Pain - Right/Left: Left Pain - part of body: Knee;Leg                Time: 5277-8242 OT Time Calculation (min): 28 min Charges:  OT General Charges $OT Visit: 1 Visit OT Evaluation $OT Eval Low Complexity: 1 Low OT Treatments $Self Care/Home Management : 23-37 mins  Rockney Ghee, M.S., OTR/L Ascom: [phone] 02/11/19,  12:39 PM

## 2019-02-11 NOTE — Anesthesia Postprocedure Evaluation (Signed)
Anesthesia Post Note  Patient: Shelly Wright  Procedure(s) Performed: COMPUTER ASSISTED TOTAL KNEE ARTHROPLASTY - RNFA (Left Knee)  Patient location during evaluation: Nursing Unit Anesthesia Type: Spinal Level of consciousness: oriented and awake and alert Pain management: pain level controlled Vital Signs Assessment: post-procedure vital signs reviewed and stable Respiratory status: spontaneous breathing and respiratory function stable Cardiovascular status: blood pressure returned to baseline and stable Postop Assessment: no headache, no backache, no apparent nausea or vomiting and patient able to bend at knees Anesthetic complications: no     Last Vitals:  Vitals:   02/11/19 0354 02/11/19 0742  BP: 115/75 117/76  Pulse: 66 71  Resp: 19   Temp: 36.6 C 36.6 C  SpO2: 96% 100%    Last Pain:  Vitals:   02/11/19 0751  TempSrc:   PainSc: 4                  Caryl Asp

## 2019-02-12 MED ORDER — MAGNESIUM CITRATE PO SOLN
1.0000 | Freq: Once | ORAL | Status: AC
  Administered 2019-02-12: 1 via ORAL
  Filled 2019-02-12: qty 296

## 2019-02-12 NOTE — TOC Transition Note (Signed)
Transition of Care [facility]) - CM/SW Discharge Note   Patient Details  Name: Shelly Wright MRN: [id number] Date of Birth: [birth year]  Transition of Care [facility]) CM/SW Contact:  Su Hilt, RN Phone Number: 02/12/2019, 9:53 AM   Clinical Narrative:    Patient to Discharge home with [facility] She has the RW and [facility] in the room to take home with her Her nephew will provide transportation   Final next level of care: [place] Barriers to Discharge: Barriers Resolved   Patient Goals and CMS Choice Patient states their goals for this hospitalization and ongoing recovery are:: go home with [facility] CMS Medicare.gov Compare Post Acute Care list provided to:: Patient Choice offered to / list presented to : Patient  Discharge Placement                       Discharge Plan and Services   Discharge Planning Services: CM Consult Post Acute Care Choice: Home Health          DME Arranged: 3-N-1, Walker rolling DME Agency: [employer] Date DME Agency Contacted: 02/11/19   Representative spoke with at DME Agency: [place]: PT [place]: [place] ([place]) Date [place]: 02/11/19 Time [place]: 1418 Representative spoke with at [place]: [place] ([place]) Interventions     Readmission Risk Interventions No flowsheet data found.

## 2019-02-12 NOTE — Discharge Summary (Signed)
Physician Discharge Summary  Subjective: 2 Days Post-Op Procedure(s) (LRB): COMPUTER ASSISTED TOTAL KNEE ARTHROPLASTY - RNFA (Left) Patient reports pain as mild.   Patient seen in rounds with Dr. Ernest PineHooten. Patient is well, and has had no acute complaints or problems Patient is ready to go home with home health physical therapy  Physician Discharge Summary  Patient ID: Shelly Wright MRN: [id number] DOB/AGE: [birth year] 50 y.o.  Admit date: 02/10/2019 Discharge date: 02/12/2019  Admission Diagnoses:  Discharge Diagnoses:  Active Problems:   Total knee replacement status   Discharged Condition: good  Hospital Course: The patient is postop day 2 from a left total knee replacement.  She is done very well since surgery.  She ambulated 250 feet including stairs yesterday.  She has not had a bowel movement yet.  The patient has stable vitals.  Her pain is under control.  Her Hemovac was removed this morning.  Treatments: surgery:   Left total knee arthroplasty using computer-assisted navigation  SURGEON:  Jena GaussJames P Hooten, Jr. M.D.  ASSISTANT: Baldwin JamaicaBen Smith, PA-C (present and scrubbed throughout the case, critical for assistance with exposure, retraction, instrumentation, and closure)  ANESTHESIA: spinal  ESTIMATED BLOOD LOSS: 50 mL  FLUIDS REPLACED: 1200 mL of crystalloid  TOURNIQUET TIME: 122 minutes  DRAINS: 2 medium Hemovac drains  SOFT TISSUE RELEASES: Anterior cruciate ligament, posterior cruciate ligament, deep medial collateral ligament, patellofemoral ligament  IMPLANTS UTILIZED: DePuy Attune size 5 posterior stabilized femoral component (cemented), size 6 rotating platform tibial component (cemented), 38 mm medialized dome patella (cemented), and an 8 mm stabilized rotating platform polyethylene insert.  Discharge Exam: Blood pressure (!) 144/78, pulse 63, temperature 97.9 F (36.6 C), temperature source Oral, resp. rate 16, SpO2 100 %.   Disposition: Discharge  disposition: 01-Home or Self Care        Allergies as of 02/12/2019   No Known Allergies     Medication List    STOP taking these medications   diclofenac 75 MG EC tablet Commonly known as: VOLTAREN     TAKE these medications   ARIPiprazole 15 MG tablet Commonly known as: ABILIFY TAKE 1 TABLET BY MOUTH DAILY What changed: when to take this   busPIRone 10 MG tablet Commonly known as: BUSPAR Take 1 tablet (10 mg total) by mouth 2 (two) times daily.   celecoxib 200 MG capsule Commonly known as: CELEBREX Take 1 capsule (200 mg total) by mouth 2 (two) times daily.   enoxaparin 40 MG/0.4ML injection Commonly known as: LOVENOX Inject 0.4 mLs (40 mg total) into the skin daily for 14 doses.   gabapentin 800 MG tablet Commonly known as: Neurontin Take 1 tablet (800 mg total) by mouth every 6 (six) hours.   hydrochlorothiazide 25 MG tablet Commonly known as: HYDRODIURIL Take 25 mg by mouth every morning.   lidocaine 5 % ointment Commonly known as: XYLOCAINE Apply 1 application topically 4 (four) times daily as needed for moderate pain. (1application= 5 g, or 6 inches of ointment) Max: 20 g/day   methocarbamol 750 MG tablet Commonly known as: ROBAXIN Take 1 tablet (750 mg total) by mouth 3 (three) times daily. Max: 3/day   oxyCODONE 5 MG immediate release tablet Commonly known as: Oxy IR/ROXICODONE Take 1 tablet (5 mg total) by mouth every 4 (four) hours as needed for moderate pain (pain score 4-6).   propranolol 10 MG tablet Commonly known as: INDERAL TAKE 1 TABLET BY MOUTH THREE TIMES DAILYAS NEEDED FOR SEVERE PANIC ATTACKS ONLY What changed: See the  new instructions.   simvastatin 20 MG tablet Commonly known as: ZOCOR Take 20 mg by mouth at bedtime.   Spiriva HandiHaler 18 MCG inhalation capsule Generic drug: tiotropium Place 18 mcg into inhaler and inhale every morning.   traMADol 50 MG tablet Commonly known as: ULTRAM Take 1-2 tablets (50-100 mg total)  by mouth every 4 (four) hours as needed for moderate pain.   traZODone 100 MG tablet Commonly known as: DESYREL Take 2 tablets (200 mg total) by mouth at bedtime as needed for sleep. What changed: when to take this   venlafaxine XR 150 MG 24 hr capsule Commonly known as: EFFEXOR-XR Take 2 capsules (300 mg total) by mouth daily with breakfast.   Ventolin HFA 108 (90 Base) MCG/ACT inhaler Generic drug: albuterol Inhale 2 puffs into the lungs 4 (four) times daily.            Durable Medical Equipment  (From admission, onward)         Start     Ordered   02/10/19 1602  DME Walker rolling  Once    Question:  Patient needs a walker to treat with the following condition  Answer:  Total knee replacement status   02/10/19 1602   02/10/19 1602  DME Bedside commode  Once    Question:  Patient needs a bedside commode to treat with the following condition  Answer:  Total knee replacement status   02/10/19 1602         Follow-up Information    Tera Partridge, PA On 02/24/2019.   Specialty: Physician Assistant Why: at 10:15am Contact information: [address] [place] [address] [phone]        Donato Heinz, MD On 03/30/2019.   Specialty: Orthopedic Surgery Why: at 10:45am Contact information: [address] [facility] [place] [address] [phone]           Signed: Lenard Forth, Idania Desouza 02/12/2019, 6:41 AM   Objective: Vital signs in last 24 hours: Temp:  [97.6 F (36.4 C)-98.2 F (36.8 C)] 97.9 F (36.6 C) (09/24 2327) Pulse Rate:  [63-78] 63 (09/24 2327) Resp:  [16] 16 (09/24 2327) BP: (117-144)/(74-87) 144/78 (09/24 2327) SpO2:  [93 %-100 %] 100 % (09/24 2327)  Intake/Output from previous day:  Intake/Output Summary (Last 24 hours) at 02/12/2019 0641 Last data filed at 02/12/2019 0437 Gross per 24 hour  Intake 720 ml  Output 241 ml  Net 479 ml    Intake/Output this shift: Total I/O In: -  Out: 141 [Urine:1;  Drains:140]  Labs: No results for input(s): HGB in the last 72 hours. No results for input(s): WBC, RBC, HCT, PLT in the last 72 hours. No results for input(s): NA, K, CL, CO2, BUN, CREATININE, GLUCOSE, CALCIUM in the last 72 hours. No results for input(s): LABPT, INR in the last 72 hours.  EXAM: General - Patient is Alert and Oriented Extremity - Neurovascular intact Sensation intact distally Dorsiflexion/Plantar flexion intact Compartment soft Incision - clean, dry, no drainage Motor Function -plantarflexion and dorsiflexion are intact.  Able to straight leg raise independently.  Assessment/Plan: 2 Days Post-Op Procedure(s) (LRB): COMPUTER ASSISTED TOTAL KNEE ARTHROPLASTY - RNFA (Left) Procedure(s) (LRB): COMPUTER ASSISTED TOTAL KNEE ARTHROPLASTY - RNFA (Left) Past Medical History:  Diagnosis Date  . Acute right-sided low back pain with right-sided sciatica 08/30/2014  . Anxiety   . Arthritis   . Asthma    in past, no current inhalers  . Bipolar disorder (HCC)   .  Cervical spinal cord compression ([place]) 05/07/2012  . COPD (chronic obstructive pulmonary disease) ([place])   . DDD (degenerative disc disease), lumbar   . Depression   . Dyspnea    with exertion  . Dysrhythmia    irregular-h/o in the past  . Fibromyalgia   . Hypertension   . Neuritis or radiculitis due to rupture of lumbar intervertebral disc 05/23/2014  . Periprosthetic fracture around prosthetic knee 07/31/2015  . Restless leg syndrome   . Soft tissue lesion of shoulder region 01/24/2012  . 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 Syncope and collapse 04/08/2014  . Thoracic and lumbosacral neuritis 05/07/2012  . Thoracic neuritis 05/07/2012   Active Problems:   Total knee replacement status  Estimated body mass index is 37.5 kg/m as calculated from the following:   Height as of 02/02/19: 5\' 6"  (1.676 m).   Weight as of 02/02/19: 105.4  kg. Advance diet Up with therapy Discharge home with home health Diet - Regular diet Follow up - in 2 weeks Activity - WBAT Disposition - Home Condition Upon Discharge - Good DVT Prophylaxis - Lovenox and TED hose  Reche Dixon, PA-C Orthopaedic Surgery 02/12/2019, 6:41 AM

## 2019-02-12 NOTE — Progress Notes (Signed)
  Subjective: 2 Days Post-Op Procedure(s) (LRB): COMPUTER ASSISTED TOTAL KNEE ARTHROPLASTY - RNFA (Left) Patient reports pain as mild.   Patient seen in rounds with Dr. Marry Guan. Patient is well, and has had no acute complaints or problems Plan is to go Home after hospital stay. Negative for chest pain and shortness of breath Fever: no Gastrointestinal: Negative for nausea and vomiting  Objective: Vital signs in last 24 hours: Temp:  [97.6 F (36.4 C)-98.2 F (36.8 C)] 97.9 F (36.6 C) (09/24 2327) Pulse Rate:  [63-78] 63 (09/24 2327) Resp:  [16] 16 (09/24 2327) BP: (117-144)/(74-87) 144/78 (09/24 2327) SpO2:  [93 %-100 %] 100 % (09/24 2327)  Intake/Output from previous day:  Intake/Output Summary (Last 24 hours) at 02/12/2019 0640 Last data filed at 02/12/2019 0437 Gross per 24 hour  Intake 720 ml  Output 241 ml  Net 479 ml    Intake/Output this shift: Total I/O In: -  Out: 141 [Urine:1; Drains:140]  Labs: No results for input(s): HGB in the last 72 hours. No results for input(s): WBC, RBC, HCT, PLT in the last 72 hours. No results for input(s): NA, K, CL, CO2, BUN, CREATININE, GLUCOSE, CALCIUM in the last 72 hours. No results for input(s): LABPT, INR in the last 72 hours.   EXAM General - Patient is Alert and Oriented Extremity - Neurovascular intact Sensation intact distally Dorsiflexion/Plantar flexion intact Compartment soft Dressing/Incision - clean, dry, with the Hemovac removed with no complication.  The Hemovac tubing was intact on removal. Motor Function - intact, moving foot and toes well on exam.  Ambulated 250 feet including stairs.  Past Medical History:  Diagnosis Date  . Acute right-sided low back pain with right-sided sciatica 08/30/2014  . Anxiety   . Arthritis   . Asthma    in past, no current inhalers  . Bipolar disorder ([place])   . Cervical spinal cord compression ([place]) 05/07/2012  . COPD (chronic obstructive pulmonary disease) ([place])   . DDD  (degenerative disc disease), lumbar   . Depression   . Dyspnea    with exertion  . Dysrhythmia    irregular-h/o in the past  . Fibromyalgia   . Hypertension   . Neuritis or radiculitis due to rupture of lumbar intervertebral disc 05/23/2014  . Periprosthetic fracture around prosthetic knee 07/31/2015  . Restless leg syndrome   . Soft tissue lesion of shoulder region 01/24/2012  . 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 Syncope and collapse 04/08/2014  . Thoracic and lumbosacral neuritis 05/07/2012  . Thoracic neuritis 05/07/2012    Assessment/Plan: 2 Days Post-Op Procedure(s) (LRB): COMPUTER ASSISTED TOTAL KNEE ARTHROPLASTY - RNFA (Left) Active Problems:   Total knee replacement status  Estimated body mass index is 37.5 kg/m as calculated from the following:   Height as of 02/02/19: 5\' 6"  (1.676 m).   Weight as of 02/02/19: 105.4 kg. Advance diet Up with therapy D/C IV fluids Discharge home with home health planned for today  DVT Prophylaxis - Lovenox, Foot Pumps and TED hose Weight-Bearing as tolerated to left leg  Reche Dixon, PA-C Orthopaedic Surgery 02/12/2019, 6:40 AM

## 2019-02-12 NOTE — Progress Notes (Signed)
PT Cancellation Note  Patient Details Name: Millissa Deese Beal MRN: [id number] DOB: 03/28/1971   Cancelled Treatment:    Reason Eval/Treat Not Completed: Other (comment)   Pt continues to await BM for discharge.  Offered gait to facilitate BM but she declined.  "I'm good."     Chesley Noon 02/12/2019, 2:42 PM

## 2019-02-12 NOTE — Progress Notes (Signed)
Physical Therapy Treatment Patient Details Name: Shelly Wright MRN: [id number] DOB: August 29, 1970 Today's Date: 02/12/2019    History of Present Illness Pt is a 48 y.o female with a long history of progressive knee pain with severe tricompartmental degenerative changes. Pt is s/p L TKA (09/23). Pt with PMH including COPD, asthma, HTN, SOB with exertion, anxiety, depression, substance abuse, R TKA (2016) and cervical fusion (unknown date).    PT Comments    Participated in exercises as described below.  Bed mobility and gait around unit and to bathroom with supervision/mod independent.  Reported feeling comfortable with stairs yesterday and declined further training.  Feels good with discharge plan with no further questions.  All goals met.   Follow Up Recommendations  Outpatient PT     Equipment Recommendations  Rolling walker with 5" wheels    Recommendations for Other Services       Precautions / Restrictions Restrictions Weight Bearing Restrictions: Yes LLE Weight Bearing: Weight bearing as tolerated    Mobility  Bed Mobility Overal bed mobility: Modified Independent                Transfers Overall transfer level: Modified independent                  Ambulation/Gait Ambulation/Gait assistance: Modified independent (Device/Increase time) Gait Distance (Feet): 200 Feet Assistive device: Rolling walker (2 wheeled) Gait Pattern/deviations: Step-through pattern Gait velocity: decreased   General Gait Details: generally safe with no safety concerns or LOB   Stairs         General stair comments: stated she weas comfortable with training yesterday   Wheelchair Mobility    Modified Rankin (Stroke Patients Only)       Balance Overall balance assessment: Modified Independent                                          Cognition Arousal/Alertness: Awake/alert Behavior During Therapy: WFL for tasks assessed/performed Overall  Cognitive Status: Within Functional Limits for tasks assessed                                        Exercises Total Joint Exercises Ankle Circles/Pumps: AROM;Both;20 reps;Supine Quad Sets: Strengthening;Left;Supine(12 reps) Heel Slides: AROM;Left;10 reps;Supine Hip ABduction/ADduction: AROM;Left;10 reps;Supine Long Arc Quad: Strengthening;Left;10 reps;Seated Goniometric ROM: 0-105    General Comments        Pertinent Vitals/Pain Pain Assessment: 0-10 Pain Score: 3  Pain Location: L knee Pain Descriptors / Indicators: Sore;Discomfort Pain Intervention(s): Limited activity within patient's tolerance;Monitored during session;Ice applied    Home Living                      Prior Function            PT Goals (current goals can now be found in the care plan section) Acute Rehab PT Goals Patient Stated Goal: to go home PT Goal Formulation: With patient Time For Goal Achievement: 02/26/19 Potential to Achieve Goals: Good    Frequency    BID      PT Plan Current plan remains appropriate    Co-evaluation              AM-PAC PT "6 Clicks" Mobility   Outcome Measure  Help needed turning from your back  to your side while in a flat bed without using bedrails?: None Help needed moving from lying on your back to sitting on the side of a flat bed without using bedrails?: None Help needed moving to and from a bed to a chair (including a wheelchair)?: None Help needed standing up from a chair using your arms (e.g., wheelchair or bedside chair)?: None Help needed to walk in hospital room?: None Help needed climbing 3-5 steps with a railing? : A Little 6 Click Score: 23    End of Session Equipment Utilized During Treatment: Gait belt Activity Tolerance: Patient tolerated treatment well Patient left: in chair;with call bell/phone within reach;with chair alarm set Nurse Communication: Mobility status Pain - Right/Left: Left Pain - part of  body: Knee     Time: 3154-0086 PT Time Calculation (min) (ACUTE ONLY): 16 min  Charges:  $Gait Training: 8-22 mins                    Chesley Noon, PTA 02/12/19, 10:59 AM

## 2019-02-16 ENCOUNTER — Encounter: Payer: Self-pay | Admitting: Orthopedic Surgery

## 2019-03-05 ENCOUNTER — Other Ambulatory Visit: Payer: Self-pay

## 2019-03-05 ENCOUNTER — Other Ambulatory Visit (HOSPITAL_COMMUNITY)
Admission: RE | Admit: 2019-03-05 | Discharge: 2019-03-05 | Disposition: A | Discharge status: Home / Self Care | Payer: Medicaid Other | Source: Ambulatory Visit | Attending: Family Medicine | Admitting: Family Medicine

## 2019-03-05 DIAGNOSIS — Z20828 Contact with and (suspected) exposure to other viral communicable diseases: Secondary | ICD-10-CM | POA: Insufficient documentation

## 2019-03-05 DIAGNOSIS — Z01812 Encounter for preprocedural laboratory examination: Secondary | ICD-10-CM | POA: Diagnosis not present

## 2019-03-05 LAB — SARS CORONAVIRUS 2 (TAT 6-24 HRS): SARS Coronavirus 2: NEGATIVE

## 2019-03-08 ENCOUNTER — Other Ambulatory Visit: Payer: Self-pay

## 2019-03-08 ENCOUNTER — Ambulatory Visit (HOSPITAL_COMMUNITY)
Admission: RE | Admit: 2019-03-08 | Discharge: 2019-03-08 | Disposition: A | Discharge status: Home / Self Care | Payer: Medicaid Other | Source: Ambulatory Visit | Attending: Family Medicine | Admitting: Family Medicine

## 2019-03-08 ENCOUNTER — Telehealth: Payer: Self-pay

## 2019-03-08 DIAGNOSIS — Z800 Family history of malignant neoplasm of digestive organs: Secondary | ICD-10-CM

## 2019-03-08 DIAGNOSIS — Z1211 Encounter for screening for malignant neoplasm of colon: Secondary | ICD-10-CM

## 2019-03-08 DIAGNOSIS — J441 Chronic obstructive pulmonary disease with (acute) exacerbation: Secondary | ICD-10-CM | POA: Insufficient documentation

## 2019-03-08 LAB — PULMONARY FUNCTION TEST
DL/VA % pred: 79 %
DL/VA: 3.37 ml/min/mmHg/L
DLCO unc % pred: 66 %
DLCO unc: 14.97 ml/min/mmHg
FEF 25-75 Post: 2.72 L/sec
FEF 25-75 Pre: 2.6 L/sec
FEF2575-%Change-Post: 4 %
FEF2575-%Pred-Post: 94 %
FEF2575-%Pred-Pre: 90 %
FEV1-%Change-Post: 3 %
FEV1-%Pred-Post: 89 %
FEV1-%Pred-Pre: 86 %
FEV1-Post: 2.69 L
FEV1-Pre: 2.6 L
FEV1FVC-%Change-Post: 2 %
FEV1FVC-%Pred-Pre: 100 %
FEV6-%Change-Post: 1 %
FEV6-%Pred-Post: 87 %
FEV6-%Pred-Pre: 86 %
FEV6-Post: 3.23 L
FEV6-Pre: 3.19 L
FEV6FVC-%Pred-Post: 102 %
FEV6FVC-%Pred-Pre: 102 %
FVC-%Change-Post: 0 %
FVC-%Pred-Post: 85 %
FVC-%Pred-Pre: 84 %
FVC-Post: 3.23 L
FVC-Pre: 3.21 L
Post FEV1/FVC ratio: 83 %
Post FEV6/FVC ratio: 100 %
Pre FEV1/FVC ratio: 81 %
Pre FEV6/FVC Ratio: 100 %
RV % pred: 94 %
RV: 1.79 L
TLC % pred: 96 %
TLC: 5.16 L

## 2019-03-08 MED ORDER — ALBUTEROL SULFATE (2.5 MG/3ML) 0.083% IN NEBU
2.5000 mg | INHALATION_SOLUTION | Freq: Once | RESPIRATORY_TRACT | Status: AC
  Administered 2019-03-08: 2.5 mg via RESPIRATORY_TRACT

## 2019-03-08 NOTE — Telephone Encounter (Signed)
Gastroenterology Pre-Procedure Review  Request Date: Shelly Wright. 04/08/19 Requesting Physician: Dr. Tobi Bastos  PATIENT REVIEW QUESTIONS: The patient responded to the following health history questions as indicated:    1. Are you having any GI issues? no 2. Do you have a personal history of Polyps? no 3. Do you have a family history of Colon Cancer or Polyps? yes (Father colon cancer) 4. Diabetes Mellitus? no 5. Joint replacements in the past 12 months?yes (Knee replacement 02/10/19) 6. Major health problems in the past 3 months?no 7. Any artificial heart valves, MVP, or defibrillator?no    MEDICATIONS & ALLERGIES:    Patient reports the following regarding taking any anticoagulation/antiplatelet therapy:   Plavix, Coumadin, Eliquis, Xarelto, Lovenox, Pradaxa, Brilinta, or Effient? no Aspirin? no  Patient confirms/reports the following medications:  Current Outpatient Medications  Medication Sig Dispense Refill  . albuterol (VENTOLIN HFA) 108 (90 Base) MCG/ACT inhaler Inhale 2 puffs into the lungs 4 (four) times daily.     . ARIPiprazole (ABILIFY) 15 MG tablet TAKE 1 TABLET BY MOUTH DAILY (Patient taking differently: Take 15 mg by mouth every morning. ) 90 tablet 0  . busPIRone (BUSPAR) 10 MG tablet Take 1 tablet (10 mg total) by mouth 2 (two) times daily. 180 tablet 1  . celecoxib (CELEBREX) 200 MG capsule Take 1 capsule (200 mg total) by mouth 2 (two) times daily. 60 capsule 1  . enoxaparin (LOVENOX) 40 MG/0.4ML injection Inject 0.4 mLs (40 mg total) into the skin daily for 14 doses. 5.6 mL 0  . gabapentin (NEURONTIN) 800 MG tablet Take 1 tablet (800 mg total) by mouth every 6 (six) hours. 120 tablet 5  . hydrochlorothiazide (HYDRODIURIL) 25 MG tablet Take 25 mg by mouth every morning.   1  . lidocaine (XYLOCAINE) 5 % ointment Apply 1 application topically 4 (four) times daily as needed for moderate pain. (1application= 5 g, or 6 inches of ointment) Max: 20 g/day 50 g PRN  . methocarbamol  (ROBAXIN) 750 MG tablet Take 1 tablet (750 mg total) by mouth 3 (three) times daily. Max: 3/day 90 tablet 5  . oxyCODONE (OXY IR/ROXICODONE) 5 MG immediate release tablet Take 1 tablet (5 mg total) by mouth every 4 (four) hours as needed for moderate pain (pain score 4-6). 30 tablet 0  . propranolol (INDERAL) 10 MG tablet TAKE 1 TABLET BY MOUTH THREE TIMES DAILYAS NEEDED FOR SEVERE PANIC ATTACKS ONLY (Patient taking differently: Take 10 mg by mouth 3 (three) times daily as needed (panic attacks.). ) 90 tablet 1  . simvastatin (ZOCOR) 20 MG tablet Take 20 mg by mouth at bedtime.     Marland Kitchen tiotropium (SPIRIVA HANDIHALER) 18 MCG inhalation capsule Place 18 mcg into inhaler and inhale every morning.     . traMADol (ULTRAM) 50 MG tablet Take 1-2 tablets (50-100 mg total) by mouth every 4 (four) hours as needed for moderate pain. 30 tablet 1  . traZODone (DESYREL) 100 MG tablet Take 2 tablets (200 mg total) by mouth at bedtime as needed for sleep. (Patient taking differently: Take 200 mg by mouth at bedtime. ) 180 tablet 0  . venlafaxine XR (EFFEXOR-XR) 150 MG 24 hr capsule Take 2 capsules (300 mg total) by mouth daily with breakfast. 180 capsule 0   No current facility-administered medications for this visit.     Patient confirms/reports the following allergies:  No Known Allergies  No orders of the defined types were placed in this encounter.   AUTHORIZATION INFORMATION Primary Insurance: 1D#: Group #:  Secondary Insurance: 1D#: Group #:  SCHEDULE INFORMATION: Date: 04/08/19 Time: Location:[facility]

## 2019-04-05 ENCOUNTER — Other Ambulatory Visit: Payer: Self-pay

## 2019-04-05 ENCOUNTER — Other Ambulatory Visit: Payer: Self-pay | Admitting: Family Medicine

## 2019-04-05 ENCOUNTER — Other Ambulatory Visit
Admission: RE | Admit: 2019-04-05 | Discharge: 2019-04-05 | Disposition: A | Discharge status: Home / Self Care | Payer: Medicaid Other | Source: Ambulatory Visit | Attending: Gastroenterology | Admitting: Gastroenterology

## 2019-04-05 DIAGNOSIS — Z01812 Encounter for preprocedural laboratory examination: Secondary | ICD-10-CM | POA: Insufficient documentation

## 2019-04-05 DIAGNOSIS — R9431 Abnormal electrocardiogram [ECG] [EKG]: Secondary | ICD-10-CM

## 2019-04-05 DIAGNOSIS — Z20828 Contact with and (suspected) exposure to other viral communicable diseases: Secondary | ICD-10-CM | POA: Diagnosis not present

## 2019-04-05 LAB — SARS CORONAVIRUS 2 (TAT 6-24 HRS): SARS Coronavirus 2: NEGATIVE

## 2019-04-07 ENCOUNTER — Ambulatory Visit (INDEPENDENT_AMBULATORY_CARE_PROVIDER_SITE_OTHER): Payer: Medicaid Other | Admitting: Psychiatry

## 2019-04-07 ENCOUNTER — Other Ambulatory Visit: Payer: Self-pay

## 2019-04-07 ENCOUNTER — Encounter: Payer: Self-pay | Admitting: Psychiatry

## 2019-04-07 DIAGNOSIS — F1220 Cannabis dependence, uncomplicated: Secondary | ICD-10-CM

## 2019-04-07 DIAGNOSIS — F3176 Bipolar disorder, in full remission, most recent episode depressed: Secondary | ICD-10-CM

## 2019-04-07 DIAGNOSIS — F17200 Nicotine dependence, unspecified, uncomplicated: Secondary | ICD-10-CM

## 2019-04-07 DIAGNOSIS — F410 Panic disorder [episodic paroxysmal anxiety] without agoraphobia: Secondary | ICD-10-CM | POA: Diagnosis not present

## 2019-04-07 DIAGNOSIS — F411 Generalized anxiety disorder: Secondary | ICD-10-CM | POA: Diagnosis not present

## 2019-04-07 MED ORDER — ARIPIPRAZOLE 15 MG PO TABS: 15.0000 mg | ORAL_TABLET | ORAL | 0 refills | Status: DC

## 2019-04-07 MED ORDER — PROPRANOLOL HCL 10 MG PO TABS: ORAL_TABLET | ORAL | 1 refills | Status: DC

## 2019-04-07 MED ORDER — VENLAFAXINE HCL ER 150 MG PO CP24: 300.0000 mg | ORAL_CAPSULE | Freq: Every day | ORAL | 0 refills | Status: DC

## 2019-04-07 MED ORDER — TRAZODONE HCL 100 MG PO TABS: 200.0000 mg | ORAL_TABLET | Freq: Every evening | ORAL | 0 refills | Status: DC | PRN

## 2019-04-07 NOTE — Progress Notes (Signed)
Virtual Visit via Video Note  I connected with Shelly Wright on 04/07/19 at  2:45 PM EST by a video enabled telemedicine application and verified that I am speaking with the correct person using two identifiers.   I discussed the limitations of evaluation and management by telemedicine and the availability of in person appointments. The patient expressed understanding and agreed to proceed.   I discussed the assessment and treatment plan with the patient. The patient was provided an opportunity to ask questions and all were answered. The patient agreed with the plan and demonstrated an understanding of the instructions.   The patient was advised to call back or seek an in-person evaluation if the symptoms worsen or if the condition fails to improve as anticipated.  BH MD OP Progress Note  04/07/2019 3:02 PM Shelly Wright  MRN:  [id number]  Chief Complaint:  Chief Complaint    Follow-up     HPI: Shelly Wright is a 48 year old Caucasian female, separated, on SSI, lives in [place], has a history of bipolar disorder, GAD, cannabis use disorder, panic attacks, tobacco use disorder was evaluated by telemedicine today.  Patient today reports she is currently doing well with regards to her mood symptoms.  She does feel overwhelmed on and off due to her situational stressors and the pandemic.  However overall she has been doing okay on the current combination of medications.  She is compliant on the Abilify, venlafaxine and BuSpar.  She denies side effects.  She reports sleep is good.  She reports appetite is fair.  Patient denies any suicidality, homicidality or perceptual disturbances.  She had her knee replacement recently and is doing well at this point.  She is able to walk okay without any support at this time.  She denies any pain.  Patient continues to smoke cigarettes and is not ready to quit.  She continues to use cannabis 2-3 times daily.  She is not willing to quit at this  time.   Visit Diagnosis:    ICD-10-CM   1. Bipolar disorder, in full remission, most recent episode depressed (HCC)  F31.76 ARIPiprazole (ABILIFY) 15 MG tablet    traZODone (DESYREL) 100 MG tablet  2. GAD (generalized anxiety disorder)  F41.1 traZODone (DESYREL) 100 MG tablet    venlafaxine XR (EFFEXOR-XR) 150 MG 24 hr capsule  3. Panic attacks  F41.0 propranolol (INDERAL) 10 MG tablet  4. Cannabis use disorder, moderate, dependence (HCC)  F12.20   5. Tobacco use disorder  F17.200     Past Psychiatric History: I have reviewed past psychiatric history from my progress note on 09/16/2018.  Past trials of venlafaxine, trazodone, hydroxyzine  Past Medical History:  Past Medical History:  Diagnosis Date  . Acute right-sided low back pain with right-sided sciatica 08/30/2014  . Anxiety   . Arthritis   . Asthma    in past, no current inhalers  . Bipolar disorder (HCC)   . Cervical spinal cord compression (HCC) 05/07/2012  . COPD (chronic obstructive pulmonary disease) (HCC)   . DDD (degenerative disc disease), lumbar   . Depression   . Dyspnea    with exertion  . Dysrhythmia    irregular-h/o in the past  . Fibromyalgia   . Hypertension   . Neuritis or radiculitis due to rupture of lumbar intervertebral disc 05/23/2014  . Periprosthetic fracture around prosthetic knee 07/31/2015  . Restless leg syndrome   . Soft tissue lesion of shoulder region 01/24/2012  . Status post revision of total replacement  of right knee 09/18/2015  . 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 Syncope and collapse 04/08/2014  . Thoracic and lumbosacral neuritis 05/07/2012  . Thoracic neuritis 05/07/2012    Past Surgical History:  Procedure Laterality Date  . ABDOMINAL HYSTERECTOMY    . Cervical Fusion X 2    . JOINT REPLACEMENT Right 01/2016   Dr Gavin Potters  . KNEE ARTHROPLASTY Left 02/10/2019   Procedure: COMPUTER ASSISTED TOTAL KNEE ARTHROPLASTY - RNFA;  Surgeon: Donato Heinz, MD;  Location: [facility]  ORS;  Service: Orthopedics;  Laterality: Left;  . KNEE ARTHROSCOPY Bilateral    Right knee scope 1998, Left knee scope  . TONSILLECTOMY    . TOTAL KNEE ARTHROPLASTY Right 02/01/2015   Procedure: TOTAL KNEE ARTHROPLASTY;  Surgeon: Erin Sons, MD;  Location: [facility] ORS;  Service: Orthopedics;  Laterality: Right;  . TUBAL LIGATION      Family Psychiatric History: Reviewed family psychiatric history from my progress note on 09/16/2018.  Family History:  Family History  Problem Relation Age of Onset  . Breast cancer Mother   . Colon cancer Father   . Heart attack Father   . Bipolar disorder Sister   . Depression Sister   . Schizophrenia Sister     Social History: Reviewed social history from my progress note on 09/16/2018. Social History   Socioeconomic History  . Marital status: Legally Separated    Spouse name: Not on file  . Number of children: 1  . Years of education: Not on file  . Highest education level: High school graduate  Occupational History  . Not on file  Social Needs  . Financial resource strain: Not hard at all  . Food insecurity    Worry: Never true    Inability: Never true  . Transportation needs    Medical: No    Non-medical: No  Tobacco Use  . Smoking status: Current Every Day Smoker    Packs/day: 1.00    Years: 30.00    Pack years: 30.00    Types: Cigarettes  . Smokeless tobacco: Never Used  Substance and Sexual Activity  . Alcohol use: No  . Drug use: Yes    Types: Marijuana    Comment: every day  . Sexual activity: Yes  Lifestyle  . Physical activity    Days per week: 0 days    Minutes per session: 0 min  . Stress: To some extent  Relationships  . Social Musician on phone: Not on file    Gets together: Not on file    Attends religious service: Never    Active member of club or organization: No    Attends meetings of clubs or organizations: Never    Relationship status: Separated  Other Topics Concern  . Not on file   Social History Narrative  . Not on file    Allergies: No Known Allergies  Metabolic Disorder Labs: No results found for: HGBA1C, MPG No results found for: PROLACTIN No results found for: CHOL, TRIG, HDL, CHOLHDL, VLDL, LDLCALC No results found for: TSH  Therapeutic Level Labs: No results found for: LITHIUM No results found for: VALPROATE No components found for:  CBMZ  Current Medications: Current Outpatient Medications  Medication Sig Dispense Refill  . albuterol (VENTOLIN HFA) 108 (90 Base) MCG/ACT inhaler Inhale 2 puffs into the lungs 4 (four) times daily.     . busPIRone (BUSPAR) 10 MG tablet Take 1 tablet (10 mg total) by mouth 2 (two) times  daily. 180 tablet 1  . celecoxib (CELEBREX) 200 MG capsule Take 1 capsule (200 mg total) by mouth 2 (two) times daily. 60 capsule 1  . gabapentin (NEURONTIN) 800 MG tablet Take 1 tablet (800 mg total) by mouth every 6 (six) hours. 120 tablet 5  . hydrochlorothiazide (HYDRODIURIL) 25 MG tablet Take 25 mg by mouth every morning.   1  . lidocaine (XYLOCAINE) 5 % ointment Apply 1 application topically 4 (four) times daily as needed for moderate pain. (1application= 5 g, or 6 inches of ointment) Max: 20 g/day 50 g PRN  . methocarbamol (ROBAXIN) 750 MG tablet Take 1 tablet (750 mg total) by mouth 3 (three) times daily. Max: 3/day 90 tablet 5  . simvastatin (ZOCOR) 20 MG tablet Take 20 mg by mouth at bedtime.     . traMADol (ULTRAM) 50 MG tablet Take 1-2 tablets (50-100 mg total) by mouth every 4 (four) hours as needed for moderate pain. 30 tablet 1  . ARIPiprazole (ABILIFY) 15 MG tablet Take 1 tablet (15 mg total) by mouth every morning. 90 tablet 0  . enoxaparin (LOVENOX) 40 MG/0.4ML injection Inject 0.4 mLs (40 mg total) into the skin daily for 14 doses. 5.6 mL 0  . oxyCODONE (OXY IR/ROXICODONE) 5 MG immediate release tablet Take 1 tablet (5 mg total) by mouth every 4 (four) hours as needed for moderate pain (pain score 4-6). (Patient not  taking: Reported on 04/07/2019) 30 tablet 0  . propranolol (INDERAL) 10 MG tablet TAKE 1 TABLET BY MOUTH THREE TIMES DAILYAS NEEDED FOR SEVERE PANIC ATTACKS ONLY 90 tablet 1  . tiotropium (SPIRIVA HANDIHALER) 18 MCG inhalation capsule Place 18 mcg into inhaler and inhale every morning.     . traZODone (DESYREL) 100 MG tablet Take 2 tablets (200 mg total) by mouth at bedtime as needed for sleep. 180 tablet 0  . venlafaxine XR (EFFEXOR-XR) 150 MG 24 hr capsule Take 2 capsules (300 mg total) by mouth daily with breakfast. 180 capsule 0   No current facility-administered medications for this visit.      Musculoskeletal: Strength & Muscle Tone: UTA Gait & Station: normal Patient leans: N/A  Psychiatric Specialty Exam: Review of Systems  Psychiatric/Behavioral: Positive for substance abuse. Negative for depression, hallucinations and suicidal ideas. The patient is not nervous/anxious and does not have insomnia.   All other systems reviewed and are negative.   There were no vitals taken for this visit.There is no height or weight on file to calculate BMI.  General Appearance: Casual  Eye Contact:  Good  Speech:  Normal Rate  Volume:  Normal  Mood:  Euthymic  Affect:  Congruent  Thought Process:  Goal Directed and Descriptions of Associations: Intact  Orientation:  Full (Time, Place, and Person)  Thought Content: Logical   Suicidal Thoughts:  No  Homicidal Thoughts:  No  Memory:  Immediate;   Fair Recent;   Fair Remote;   Fair  Judgement:  Fair  Insight:  Fair  Psychomotor Activity:  Normal  Concentration:  Concentration: Fair and Attention Span: Fair  Recall:  FiservFair  Fund of Knowledge: Fair  Language: Fair  Akathisia:  No  Handed:  Right  AIMS (if indicated): Denies tremors, rigidity  Assets:  Communication Skills Desire for Improvement Housing Social Support  ADL's:  Intact  Cognition: WNL  Sleep:  Fair   Screenings: PHQ2-9     Clinical Support from 08/26/2017 in  [facility] Office Visit from 09/11/2016  in [place] Procedure visit from 08/14/2016 in [place] Office Visit from 07/09/2016 in [place] Office Visit from 05/06/2016 in [facility]  PHQ-2 Total Score  0  0  0  0  0       Assessment and Plan: Jarvis is a 48 year old Caucasian female, on SSI, lives in [place], has a history of bipolar disorder, anxiety disorder, tobacco use disorder, asthma, COPD, chronic pain was evaluated by telemedicine today.  She is biologically predisposed given her family history of mental health problems as well as her own substance abuse problems.  She also has a history of trauma, history of chronic pain.  Patient with psychosocial stressors of health issues, taking care of her nephew who has emotional problems, financial stressors, separation from her husband, death of her sister and the pandemic.  Patient however is stable on current medication regimen.  Plan Bipolar disorder in remission Abilify 15 mg p.o. daily Trazodone 200 mg p.o. nightly for sleep   GAD-stable Venlafaxine 300 mg p.o. daily BuSpar 10 mg p.o. twice daily Propanolol 10 mg p.o. 3 times daily as needed for anxiety attacks/panic attacks Patient advised to continue to work with Ms. Peacock her therapist.  Patient was referred for IOP in the past however has declined in the past.  Cannabis use disorder-unstable-patient is not willing to quit.  Tobacco use disorder-unstable-patient is not ready to quit.  Provided smoking cessation counseling.  Follow-up in clinic in 2 months or sooner if needed.  January 20 at 2 PM  I have spent atleast 15 minutes non face to face with patient today. More than 50 % of the time was spent for psychoeducation and supportive psychotherapy and  care coordination. This note was generated in part or whole with voice recognition software. Voice recognition is usually quite accurate but there are transcription errors that can and very often do occur. I apologize for any typographical errors that were not detected and corrected.      Ursula Alert, MD 04/07/2019, 3:02 PM

## 2019-04-08 ENCOUNTER — Encounter: Admission: RE | Payer: Self-pay | Source: Home / Self Care

## 2019-04-08 ENCOUNTER — Encounter: Payer: Self-pay | Admitting: Pain Medicine

## 2019-04-08 ENCOUNTER — Ambulatory Visit: Admission: RE | Admit: 2019-04-08 | Payer: Medicaid Other | Source: Home / Self Care | Admitting: Gastroenterology

## 2019-04-08 SURGERY — COLONOSCOPY WITH PROPOFOL
Anesthesia: General

## 2019-04-11 NOTE — Progress Notes (Signed)
Pain Management Virtual Encounter Note - Virtual Visit via Telephone Telehealth (real-time audio visits between healthcare provider and patient).   Patient's Phone No. & Preferred Pharmacy:  [phone] (home); There is no such number on file (mobile).; (Preferred) [phone] [email or web address]  [facility] - [place], [place] - [address] [place] [address] Phone: [phone] Fax: [phone]    Pre-screening note:  Our staff contacted Shelly Wright and offered her an "in person", "face-to-face" appointment versus a telephone encounter. She indicated preferring the telephone encounter, at this time.   Reason for Virtual Visit: COVID-19*  Social distancing based on CDC and AMA recommendations.   I contacted Patriece Archbold Wright on 04/12/2019 via telephone.      I clearly identified myself as Oswaldo Done, MD. I verified that I was speaking with the correct person using two identifiers (Name: Shelly Wright, and date of birth: 1970-10-17).  Advanced Informed Consent I sought verbal advanced consent from Shelly Wright for virtual visit interactions. I informed Shelly Wright of possible security and privacy concerns, risks, and limitations associated with providing "not-in-person" medical evaluation and management services. I also informed Shelly Wright of the availability of "in-person" appointments. Finally, I informed her that there would be a charge for the virtual visit and that she could be  personally, fully or partially, financially responsible for it. Shelly Wright expressed understanding and agreed to proceed.   Historic Elements   Shelly Wright is a 48 y.o. year old, female patient evaluated today after her last encounter by our practice on 10/26/2018. Shelly Wright  has a past medical history of Acute right-sided low back pain with right-sided sciatica (08/30/2014), Anxiety, Arthritis, Asthma, Bipolar disorder (HCC), Cervical spinal cord compression (HCC) (05/07/2012), COPD  (chronic obstructive pulmonary disease) (HCC), DDD (degenerative disc disease), lumbar, Depression, Dyspnea, Dysrhythmia, Fibromyalgia, Hypertension, Neuritis or radiculitis due to rupture of lumbar intervertebral disc (05/23/2014), Periprosthetic fracture around prosthetic knee (07/31/2015), Restless leg syndrome, Soft tissue lesion of shoulder region (01/24/2012), 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 She also  has a past surgical history that includes Tonsillectomy; Knee arthroscopy (Bilateral); Cervical Fusion X 2; Abdominal hysterectomy; Tubal ligation; Total knee arthroplasty (Right, 02/01/2015); Joint replacement (Right, 01/2016); and Knee Arthroplasty (Left, 02/10/2019). Shelly Wright has a current medication list which includes the following prescription(s): albuterol, aripiprazole, buspirone, celecoxib, gabapentin, hydrochlorothiazide, lidocaine, methocarbamol, propranolol, simvastatin, tiotropium, trazodone, venlafaxine xr, and enoxaparin. She  reports that she has been smoking cigarettes. She has a 30.00 pack-year smoking history. She has never used smokeless tobacco. She reports current drug use. Drug: Marijuana. She reports that she does not drink alcohol. Shelly Wright has No Known Allergies.   HPI  Today, she is being contacted for medication management.   The patient indicates doing well with the current medication regimen. No adverse reactions or side effects reported to the medications.   Pharmacotherapy Assessment  Analgesic: No opioid analgesics prescribed by our practice.  Last prescription written byBenjamin Lovenia Kim, MD for hydrocodone/APAP 5/325 on 02/25/2019. 05/08/2016 UDS positive for unreported THC.  MME: 40 mg/day    Monitoring: Pharmacotherapy: No side-effects or adverse reactions reported. [place] PMP:  PDMP reviewed during this encounter.       Compliance: No problems identified. Effectiveness: Clinically acceptable. Plan: Refer to "POC".  UDS:  Summary  Date Value Ref Range Status  05/08/2016 FINAL  Final    Comment:    ==================================================================== TOXASSURE COMP DRUG ANALYSIS,UR ==================================================================== Test                             Result       Flag       Units Drug Present and Declared for Prescription Verification   Gabapentin                     PRESENT      EXPECTED   Trazodone                      PRESENT      EXPECTED   1,3 chlorophenyl piperazine    PRESENT      EXPECTED    1,3-chlorophenyl piperazine is an expected metabolite of    trazodone.   Venlafaxine                    PRESENT      EXPECTED   Desmethylvenlafaxine           PRESENT      EXPECTED    Desmethylvenlafaxine is an expected metabolite of venlafaxine.   Aripiprazole                   PRESENT      EXPECTED   Diclofenac                     PRESENT      EXPECTED   Hydroxyzine                    PRESENT      EXPECTED Drug Present not Declared for Prescription Verification   Carboxy-THC                    277          UNEXPECTED ng/mg creat    Carboxy-THC is a metabolite of tetrahydrocannabinol  (THC).    Source of [facility] is most commonly illicit, but THC is also present    in a scheduled prescription medication. Drug Absent but Declared for Prescription Verification   Oxycodone                      Not Detected UNEXPECTED ng/mg creat   Tizanidine                     Not Detected UNEXPECTED    Tizanidine, as indicated in the declared medication list, is not    always detected even when used as directed. ==================================================================== Test                      Result    Flag   Units      Ref Range   Creatinine              84               mg/dL       >=20 ==================================================================== Declared Medications:  The flagging and interpretation on this report are based on the  following declared medications.  Unexpected results Ardito arise from  inaccuracies in the declared medications.  **Note: The testing scope of this panel includes these medications:  Aripiprazole (Abilify)  Gabapentin  Hydroxyzine  Oxycodone (Roxicodone)  Trazodone (Desyrel)  Venlafaxine (Effexor)  **  Note: The testing scope of this panel does not include small to  moderate amounts of these reported medications:  Diclofenac (Voltaren)  Tizanidine (Zanaflex)  **Note: The testing scope of this panel does not include following  reported medications:  Cholecalciferol  Enoxaparin (Lovenox)  Hydrochlorothiazide  Hydrochlorothiazide (Microzide)  Simvastatin (Zocor) ==================================================================== For clinical consultation, please call [phone]. ====================================================================    Laboratory Chemistry Profile (12 mo)  Renal: 02/02/2019: BUN 19; Creatinine, Ser 1.05  Lab Results  Component Value Date   GFRAA >60 02/02/2019   GFRNONAA >60 02/02/2019   Hepatic: 02/02/2019: Albumin 4.4 Lab Results  Component Value Date   AST 15 02/02/2019   ALT 15 02/02/2019   Other: 02/02/2019: CRP <0.8; Sed Rate 1 Note: Above Lab results reviewed.  Imaging  Last 90 days:  Dg Knee Left Port  Result Date: 02/10/2019 CLINICAL DATA:  Status post left total knee replacement. EXAM: PORTABLE LEFT KNEE - 1-2 VIEW COMPARISON:  Radiographs dated 11/25/2006 FINDINGS: The components of the total knee prosthesis appear in excellent position. Surgical drain in place. No fractures. IMPRESSION: Satisfactory appearance of the left knee after total knee replacement. Electronically Signed   By: Francene BoyersJames  Maxwell M.D.   On: 02/10/2019 15:42    Assessment  The primary encounter  diagnosis was Chronic pain syndrome. Diagnoses of Chronic sacroiliac joint pain (Primary Source of Pain) (Bilateral) (L>R), Chronic low back pain (Secondary area of Pain) (Bilateral) (L>R), Chronic shoulder pain (Third area of Pain) (Bilateral) (L>R), Chronic musculoskeletal pain, Neurogenic pain, and Fibromyalgia were also pertinent to this visit.  Plan of Care  I have discontinued Valentino SaxonValerie D. Choate's traMADol and oxyCODONE. I am also having her maintain her simvastatin, hydrochlorothiazide, albuterol, tiotropium, busPIRone, celecoxib, enoxaparin, ARIPiprazole, propranolol, traZODone, venlafaxine XR, methocarbamol, gabapentin, and lidocaine.  Pharmacotherapy (Medications Ordered): Meds ordered this encounter  Medications  . methocarbamol (ROBAXIN) 750 MG tablet    Sig: Take 1 tablet (750 mg total) by mouth 3 (three) times daily. Max: 3/day    Dispense:  90 tablet    Refill:  5    Fill one day early if pharmacy is closed on scheduled refill date. Papandrea substitute for generic if available.  . gabapentin (NEURONTIN) 800 MG tablet    Sig: Take 1 tablet (800 mg total) by mouth every 6 (six) hours.    Dispense:  120 tablet    Refill:  5    Fill one day early if pharmacy is closed on scheduled refill date. Simonich substitute for generic if available.  . lidocaine (XYLOCAINE) 5 % ointment    Sig: Apply 1 application topically 4 (four) times daily as needed for moderate pain. (1application= 5 g, or 6 inches of ointment) Max: 20 g/day    Dispense:  50 g    Refill:  PRN    Fill one day early if pharmacy is closed on scheduled refill date. Bautch substitute for generic if available.   Orders:  No orders of the defined types were placed in this encounter.  Follow-up plan:   No follow-ups on file.      Interventional treatment options: Planned, scheduled, and/or pending:      Under consideration:   Possible bilateral lumbar facet RFA Possible bilateral SI RFA Diagnostic bilateral IA shoulder joint  injection Diagnostic bilateral suprascapular NB Possible bilateral suprascapular RFA Diagnostic left IA knee joint injection Possible series of 5 Hyalgan IA left knee joint injections Diagnostic bilateral genicular NB Possible bilateral genicular nerve RFA Diagnostic bilateral cervical facet block Possible  bilateral cervical facet RFA Diagnostic left CESI   Therapeutic/palliative (PRN):   Diagnostic bilateral lumbar facet block #2  Diagnostic bilateral SIblock #2     Recent Visits No visits were found meeting these conditions.  Showing recent visits within past 90 days and meeting all other requirements   Today's Visits Date Type Provider Dept  04/12/19 Telemedicine Delano Metz, MD [facility]  Showing today's visits and meeting all other requirements   Future Appointments No visits were found meeting these conditions.  Showing future appointments within next 90 days and meeting all other requirements   I discussed the assessment and treatment plan with the patient. The patient was provided an opportunity to ask questions and all were answered. The patient agreed with the plan and demonstrated an understanding of the instructions.  Patient advised to call back or seek an in-person evaluation if the symptoms or condition worsens.  Total duration of non-face-to-face encounter: 12 minutes.  Note by: Oswaldo Done, MD Date: 04/12/2019; Time: 8:41 AM  Note: This dictation was prepared with Dragon dictation. Any transcriptional errors that Saber result from this process are unintentional.  Disclaimer:  * Given the special circumstances of the COVID-19 pandemic, the federal government has announced that the Office for Civil Rights (OCR) will exercise its enforcement discretion and will not impose penalties on physicians using telehealth in the event of noncompliance with regulatory requirements under the [employer] Portability and Accountability Act  (HIPAA) in connection with the good faith provision of telehealth during the COVID-19 national public health emergency. (AMA)

## 2019-04-12 ENCOUNTER — Ambulatory Visit: Payer: Medicaid Other | Attending: Pain Medicine | Admitting: Pain Medicine

## 2019-04-12 ENCOUNTER — Other Ambulatory Visit: Payer: Self-pay

## 2019-04-12 DIAGNOSIS — M533 Sacrococcygeal disorders, not elsewhere classified: Secondary | ICD-10-CM

## 2019-04-12 DIAGNOSIS — M7918 Myalgia, other site: Secondary | ICD-10-CM

## 2019-04-12 DIAGNOSIS — G8929 Other chronic pain: Secondary | ICD-10-CM

## 2019-04-12 DIAGNOSIS — M25511 Pain in right shoulder: Secondary | ICD-10-CM

## 2019-04-12 DIAGNOSIS — M545 Low back pain: Secondary | ICD-10-CM | POA: Diagnosis not present

## 2019-04-12 DIAGNOSIS — G894 Chronic pain syndrome: Secondary | ICD-10-CM | POA: Diagnosis not present

## 2019-04-12 DIAGNOSIS — M797 Fibromyalgia: Secondary | ICD-10-CM

## 2019-04-12 DIAGNOSIS — M25512 Pain in left shoulder: Secondary | ICD-10-CM

## 2019-04-12 DIAGNOSIS — M792 Neuralgia and neuritis, unspecified: Secondary | ICD-10-CM

## 2019-04-12 MED ORDER — METHOCARBAMOL 750 MG PO TABS: 750.0000 mg | ORAL_TABLET | Freq: Three times a day (TID) | ORAL | 5 refills | Status: DC

## 2019-04-12 MED ORDER — GABAPENTIN 800 MG PO TABS: 800.0000 mg | ORAL_TABLET | Freq: Four times a day (QID) | ORAL | 5 refills | Status: DC

## 2019-04-12 MED ORDER — LIDOCAINE 5 % EX OINT: 1.0000 "application " | TOPICAL_OINTMENT | Freq: Four times a day (QID) | CUTANEOUS | 99 refills | Status: DC | PRN

## 2019-04-13 ENCOUNTER — Other Ambulatory Visit: Payer: Self-pay | Admitting: Physician Assistant

## 2019-04-13 DIAGNOSIS — R0602 Shortness of breath: Secondary | ICD-10-CM

## 2019-04-13 DIAGNOSIS — F17200 Nicotine dependence, unspecified, uncomplicated: Secondary | ICD-10-CM

## 2019-04-13 DIAGNOSIS — I10 Essential (primary) hypertension: Secondary | ICD-10-CM

## 2019-04-13 DIAGNOSIS — R9431 Abnormal electrocardiogram [ECG] [EKG]: Secondary | ICD-10-CM

## 2019-04-13 DIAGNOSIS — E785 Hyperlipidemia, unspecified: Secondary | ICD-10-CM

## 2019-04-14 ENCOUNTER — Ambulatory Visit
Admission: RE | Admit: 2019-04-14 | Discharge: 2019-04-14 | Disposition: A | Discharge status: Home / Self Care | Payer: Medicaid Other | Source: Ambulatory Visit | Attending: Family Medicine | Admitting: Family Medicine

## 2019-04-14 ENCOUNTER — Other Ambulatory Visit: Payer: Self-pay

## 2019-04-14 DIAGNOSIS — I119 Hypertensive heart disease without heart failure: Secondary | ICD-10-CM | POA: Diagnosis not present

## 2019-04-14 DIAGNOSIS — R9431 Abnormal electrocardiogram [ECG] [EKG]: Secondary | ICD-10-CM | POA: Diagnosis not present

## 2019-04-14 DIAGNOSIS — I081 Rheumatic disorders of both mitral and tricuspid valves: Secondary | ICD-10-CM | POA: Insufficient documentation

## 2019-04-14 NOTE — Progress Notes (Signed)
*  PRELIMINARY RESULTS* Echocardiogram 2D Echocardiogram has been performed.  Shelly Wright 04/14/2019, 11:35 AM

## 2019-04-20 ENCOUNTER — Encounter
Admission: RE | Admit: 2019-04-20 | Discharge: 2019-04-20 | Disposition: A | Discharge status: Home / Self Care | Payer: Medicaid Other | Source: Ambulatory Visit | Attending: Physician Assistant | Admitting: Physician Assistant

## 2019-04-20 ENCOUNTER — Other Ambulatory Visit: Payer: Self-pay

## 2019-04-20 DIAGNOSIS — F17200 Nicotine dependence, unspecified, uncomplicated: Secondary | ICD-10-CM | POA: Diagnosis present

## 2019-04-20 DIAGNOSIS — I10 Essential (primary) hypertension: Secondary | ICD-10-CM | POA: Insufficient documentation

## 2019-04-20 DIAGNOSIS — R0602 Shortness of breath: Secondary | ICD-10-CM | POA: Diagnosis present

## 2019-04-20 DIAGNOSIS — E785 Hyperlipidemia, unspecified: Secondary | ICD-10-CM | POA: Diagnosis present

## 2019-04-20 DIAGNOSIS — R9431 Abnormal electrocardiogram [ECG] [EKG]: Secondary | ICD-10-CM | POA: Insufficient documentation

## 2019-04-20 MED ORDER — TECHNETIUM TC 99M TETROFOSMIN IV KIT
30.0000 | PACK | Freq: Once | INTRAVENOUS | Status: AC | PRN
  Administered 2019-04-20: 11:00:00 32.54 via INTRAVENOUS

## 2019-04-20 MED ORDER — REGADENOSON 0.4 MG/5ML IV SOLN
0.4000 mg | Freq: Once | INTRAVENOUS | Status: AC
  Administered 2019-04-20: 11:00:00 0.4 mg via INTRAVENOUS
  Filled 2019-04-20: qty 5

## 2019-04-20 MED ORDER — TECHNETIUM TC 99M TETROFOSMIN IV KIT
10.0000 | PACK | Freq: Once | INTRAVENOUS | Status: AC | PRN
  Administered 2019-04-20: 9.88 via INTRAVENOUS

## 2019-04-21 LAB — NM MYOCAR MULTI W/SPECT W/WALL MOTION / EF
Estimated workload: 1 METS
Exercise duration (min): 1 min
Exercise duration (sec): 1 s
LV dias vol: 84 mL (ref 46–106)
LV sys vol: 26 mL
Peak HR: 96 {beats}/min
Percent HR: 56 %
Rest HR: 72 {beats}/min
SDS: 8
SRS: 8
SSS: 12
TID: 0.98

## 2019-05-12 ENCOUNTER — Telehealth: Payer: Self-pay

## 2019-05-12 NOTE — Telephone Encounter (Signed)
edication management - Message left for pt her message was received that she would like something different from Venlafaxine.  Requested pt call back to discuss and informed Dr. Shea Evans would be back in the office on 05/17/19.

## 2019-05-17 NOTE — Telephone Encounter (Signed)
Called pharmacy pt is 5 days to early to get medication.  They do have the medication but insurance will not pay for because pt trying to get early. I you wanted them to fill early they the pharmacy will need your ok to refill early.

## 2019-05-17 NOTE — Telephone Encounter (Signed)
Ok got it

## 2019-05-17 NOTE — Telephone Encounter (Signed)
Returned call to patient - she reports her health insurance will no longer pay for effexor. Will send a message to Janett Billow CMA if we need to do a PA . Will get in touch with patient once we find out.

## 2019-06-09 ENCOUNTER — Ambulatory Visit (INDEPENDENT_AMBULATORY_CARE_PROVIDER_SITE_OTHER): Payer: Medicaid Other | Admitting: Psychiatry

## 2019-06-09 ENCOUNTER — Other Ambulatory Visit: Payer: Self-pay

## 2019-06-09 ENCOUNTER — Encounter: Payer: Self-pay | Admitting: Psychiatry

## 2019-06-09 DIAGNOSIS — F1220 Cannabis dependence, uncomplicated: Secondary | ICD-10-CM

## 2019-06-09 DIAGNOSIS — F411 Generalized anxiety disorder: Secondary | ICD-10-CM

## 2019-06-09 DIAGNOSIS — F3176 Bipolar disorder, in full remission, most recent episode depressed: Secondary | ICD-10-CM

## 2019-06-09 DIAGNOSIS — F410 Panic disorder [episodic paroxysmal anxiety] without agoraphobia: Secondary | ICD-10-CM

## 2019-06-09 DIAGNOSIS — F17200 Nicotine dependence, unspecified, uncomplicated: Secondary | ICD-10-CM

## 2019-06-09 MED ORDER — BUSPIRONE HCL 15 MG PO TABS: 15.0000 mg | ORAL_TABLET | Freq: Three times a day (TID) | ORAL | 0 refills | Status: DC

## 2019-06-09 MED ORDER — VENLAFAXINE HCL ER 150 MG PO CP24: 300.0000 mg | ORAL_CAPSULE | Freq: Every day | ORAL | 0 refills | Status: DC

## 2019-06-09 MED ORDER — TRAZODONE HCL 100 MG PO TABS: 200.0000 mg | ORAL_TABLET | Freq: Every evening | ORAL | 0 refills | Status: DC | PRN

## 2019-06-09 MED ORDER — ARIPIPRAZOLE 15 MG PO TABS: 15.0000 mg | ORAL_TABLET | ORAL | 0 refills | Status: DC

## 2019-06-09 NOTE — Progress Notes (Signed)
Provider Location : [facility] Patient Location : Home   Virtual Visit via Video Note  I connected with Shelly Wright on 06/09/19 at  2:00 PM EST by a video enabled telemedicine application and verified that I am speaking with the correct person using two identifiers.   I discussed the limitations of evaluation and management by telemedicine and the availability of in person appointments. The patient expressed understanding and agreed to proceed.    I discussed the assessment and treatment plan with the patient. The patient was provided an opportunity to ask questions and all were answered. The patient agreed with the plan and demonstrated an understanding of the instructions.   The patient was advised to call back or seek an in-person evaluation if the symptoms worsen or if the condition fails to improve as anticipated.   BH MD OP Progress Note  06/09/2019 6:02 PM Shelly Wright  MRN:  [id number]  Chief Complaint:  Chief Complaint    Follow-up     HPI: Shelly Wright is a 49 year old Caucasian female, separated on SSI, lives in [place], has a history of bipolar disorder, GAD, cannabis use disorder, panic attacks, tobacco use disorder was evaluated by telemedicine today.  Patient today reports that she lost her dad over the weekend.  She reports she is unable to attend his funeral since he lived in [place] as well as her stepmother does not want her to be there.  Patient became very tearful when she discussed this.  She currently struggles with anxiety, nervousness, tearfulness on a regular basis.  Patient wonders whether she can start Valium to help with her anxiety symptoms.  Patient reports sleep continues to be good.  She denies suicidality, homicidality or perceptual disturbances.  Patient reports she continues to use cannabis 3-4 blunts per day.  She reports she is not ready to quit.  Attempted to provide counseling.  Patient however reports she does not want to make any changes since  nobody is willing to give her Valium she is planning to continue the cannabis which helps with her pain as well as her anxiety and sleep.  Patient also has been noncompliant with her psychotherapy sessions.  Encouraged her to restart psychotherapy session given her recent stressors.  Patient agrees to do so.  Visit Diagnosis:    ICD-10-CM   1. Bipolar disorder, in full remission, most recent episode depressed (HCC)  F31.76 ARIPiprazole (ABILIFY) 15 MG tablet    traZODone (DESYREL) 100 MG tablet  2. GAD (generalized anxiety disorder)  F41.1 busPIRone (BUSPAR) 15 MG tablet    venlafaxine XR (EFFEXOR-XR) 150 MG 24 hr capsule    traZODone (DESYREL) 100 MG tablet  3. Panic attacks  F41.0 busPIRone (BUSPAR) 15 MG tablet  4. Cannabis use disorder, moderate, dependence (HCC)  F12.20   5. Tobacco use disorder  F17.200     Past Psychiatric History: I have reviewed past psychiatric history from my progress note on 09/16/2018.  Past trials of venlafaxine, trazodone, hydroxyzine.  Past Medical History:  Past Medical History:  Diagnosis Date  . Acute right-sided low back pain with right-sided sciatica 08/30/2014  . Anxiety   . Arthritis   . Asthma    in past, no current inhalers  . Bipolar disorder (HCC)   . Cervical spinal cord compression (HCC) 05/07/2012  . COPD (chronic obstructive pulmonary disease) (HCC)   . DDD (degenerative disc disease), lumbar   . Depression   . Dyspnea    with exertion  . Dysrhythmia  irregular-h/o in the past  . Fibromyalgia   . Hypertension   . Neuritis or radiculitis due to rupture of lumbar intervertebral disc 05/23/2014  . Periprosthetic fracture around prosthetic knee 07/31/2015  . Restless leg syndrome   . Soft tissue lesion of shoulder region 01/24/2012  . 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 Syncope and collapse 04/08/2014  . Thoracic and lumbosacral neuritis 05/07/2012  .  Thoracic neuritis 05/07/2012    Past Surgical History:  Procedure Laterality Date  . ABDOMINAL HYSTERECTOMY    . Cervical Fusion X 2    . JOINT REPLACEMENT Right 01/2016   Dr Gavin Potters  . KNEE ARTHROPLASTY Left 02/10/2019   Procedure: COMPUTER ASSISTED TOTAL KNEE ARTHROPLASTY - RNFA;  Surgeon: Donato Heinz, MD;  Location: [facility] ORS;  Service: Orthopedics;  Laterality: Left;  . KNEE ARTHROSCOPY Bilateral    Right knee scope 1998, Left knee scope  . TONSILLECTOMY    . TOTAL KNEE ARTHROPLASTY Right 02/01/2015   Procedure: TOTAL KNEE ARTHROPLASTY;  Surgeon: Erin Sons, MD;  Location: [facility] ORS;  Service: Orthopedics;  Laterality: Right;  . TUBAL LIGATION      Family Psychiatric History: I have reviewed family psychiatric history from my progress note on 09/16/2018.  Family History:  Family History  Problem Relation Age of Onset  . Breast cancer Mother   . Colon cancer Father   . Heart attack Father   . Bipolar disorder Sister   . Depression Sister   . Schizophrenia Sister     Social History: Reviewed social history from my progress note on 09/16/2018. Social History   Socioeconomic History  . Marital status: Legally Separated    Spouse name: Not on file  . Number of children: 1  . Years of education: Not on file  . Highest education level: High school graduate  Occupational History  . Not on file  Tobacco Use  . Smoking status: Current Every Day Smoker    Packs/day: 1.00    Years: 30.00    Pack years: 30.00    Types: Cigarettes  . Smokeless tobacco: Never Used  Substance and Sexual Activity  . Alcohol use: No  . Drug use: Yes    Types: Marijuana    Comment: every day  . Sexual activity: Yes  Other Topics Concern  . Not on file  Social History Narrative  . Not on file   Social Determinants of Health   Financial Resource Strain: Low Risk   . Difficulty of Paying Living Expenses: Not hard at all  Food Insecurity: No Food Insecurity  . Worried About Community education officer in the Last Year: Never true  . Ran Out of Food in the Last Year: Never true  Transportation Needs: No Transportation Needs  . Lack of Transportation (Medical): No  . Lack of Transportation (Non-Medical): No  Physical Activity: Inactive  . Days of Exercise per Week: 0 days  . Minutes of Exercise per Session: 0 min  Stress: Stress Concern Present  . Feeling of Stress : To some extent  Social Connections: Unknown  . Frequency of Communication with Friends and Family: Not on file  . Frequency of Social Gatherings with Friends and Family: Not on file  . Attends Religious Services: Never  . Active Member of Clubs or Organizations: No  . Attends Banker Meetings: Never  . Marital Status: Separated    Allergies: No Known Allergies  Metabolic Disorder Labs: No results found for: HGBA1C, MPG No results found for: PROLACTIN No results found for: CHOL, TRIG, HDL, CHOLHDL, VLDL, LDLCALC No results found for: TSH  Therapeutic Level Labs: No results found for: LITHIUM No results found for: VALPROATE No components found for:  CBMZ  Current Medications: Current Outpatient Medications  Medication Sig Dispense Refill  . albuterol (VENTOLIN HFA) 108 (90 Base) MCG/ACT inhaler Inhale 2 puffs into the lungs 4 (four) times daily.     . ARIPiprazole (ABILIFY) 15 MG tablet Take 1 tablet (15 mg total) by mouth every morning. 90 tablet 0  . busPIRone (BUSPAR) 15 MG tablet Take 1 tablet (15 mg total) by mouth 3 (three) times daily. 270 tablet 0  . celecoxib (CELEBREX) 200 MG capsule Take 1 capsule (200 mg total) by mouth 2 (two) times daily. 60 capsule 1  . enoxaparin (LOVENOX) 40 MG/0.4ML injection Inject 0.4 mLs (40 mg total) into the skin daily for 14 doses. 5.6 mL 0  . gabapentin (NEURONTIN) 800 MG tablet Take 1 tablet (800 mg total) by mouth every 6 (six) hours. 120 tablet 5  . hydrochlorothiazide (HYDRODIURIL) 25 MG tablet Take 25 mg by mouth every morning.   1  .  lidocaine (XYLOCAINE) 5 % ointment Apply 1 application topically 4 (four) times daily as needed for moderate pain. (1application= 5 g, or 6 inches of ointment) Max: 20 g/day 50 g PRN  . methocarbamol (ROBAXIN) 750 MG tablet Take 1 tablet (750 mg total) by mouth 3 (three) times daily. Max: 3/day 90 tablet 5  . propranolol (INDERAL) 10 MG tablet TAKE 1 TABLET BY MOUTH THREE TIMES DAILYAS NEEDED FOR SEVERE PANIC ATTACKS ONLY 90 tablet 1  . simvastatin (ZOCOR) 20 MG tablet Take 20 mg by mouth at bedtime.     Marland Kitchen tiotropium (SPIRIVA HANDIHALER) 18 MCG inhalation capsule Place 18 mcg into inhaler and inhale every morning.     . traZODone (DESYREL) 100 MG tablet Take 2 tablets (200 mg total) by mouth at bedtime as needed for sleep. 180 tablet 0  . venlafaxine XR (EFFEXOR-XR) 150 MG 24 hr capsule Take 2 capsules (300 mg total) by mouth daily with breakfast. 180 capsule 0   No current facility-administered medications for this visit.     Musculoskeletal: Strength & Muscle Tone: UTA Gait & Station: normal Patient leans: N/A  Psychiatric Specialty Exam: Review of Systems  Psychiatric/Behavioral: Positive for dysphoric mood. The patient is nervous/anxious.   All other systems reviewed and are negative.   There were no vitals taken for this visit.There is no height or weight on file to calculate BMI.  General Appearance: Casual  Eye Contact:  Fair  Speech:  Normal Rate  Volume:  Normal  Mood:  Anxious and Dysphoric  Affect:  Tearful  Thought Process:  Goal Directed and Descriptions of Associations: Intact  Orientation:  Full (Time, Place, and Person)  Thought Content: Logical   Suicidal Thoughts:  No  Homicidal Thoughts:  No  Memory:  Immediate;   Fair Recent;   Fair Remote;   Fair  Judgement:  Fair  Insight:  Fair  Psychomotor Activity:  Normal  Concentration:  Concentration: Fair and Attention Span: Fair  Recall:  [employer] of Knowledge: Fair  Language: Fair  Akathisia:  No  Handed:   Right  AIMS (if indicated): uta  Assets:  Communication Skills Desire for Improvement Housing Social Support  ADL's:  Intact  Cognition: WNL  Sleep:  Fair   Screenings:  PHQ2-9     Clinical Support from 08/26/2017 in [facility] Office Visit from 09/11/2016 in [facility] Procedure visit from 08/14/2016 in [facility] Office Visit from 07/09/2016 in [facility] Office Visit from 05/06/2016 in [facility]  PHQ-2 Total Score  0  0  0  0  0       Assessment and Plan: Waniya is a 49 year old Caucasian female on SSI, lives in [place], has a history of bipolar disorder, anxiety disorder, tobacco use disorder, asthma, COPD, chronic pain was evaluated by telemedicine today.  Patient is biologically predisposed given her family history of mental health problems as well as her own substance abuse problems.  Patient also has history of trauma, history of chronic pain, recent death of her father.  Patient however continues to be resistant to making any changes with her substance abuse problems.  Patient also has been noncompliant with psychotherapy sessions.  Patient is preoccupied with being started on benzodiazepines.  Discussed plan as noted below.  Plan Bipolar disorder in remission Abilify 15 mg p.o. daily Trazodone 200 mg p.o. nightly for sleep  GAD-unstable Venlafaxine 300 mg p.o. daily Increase BuSpar to 15 mg p.o. 3 times daily Propranolol 10 mg p.o. 3 times daily as needed for anxiety attacks   Cannabis use disorder-unstable Provided substance abuse counseling, she is not willing to quit  Tobacco use disorder-unstable She is not ready to quit Provided smoking cessation counseling  Patient encouraged to schedule an appointment with therapist.  Patient is currently  grieving and will benefit from the same.  Will not recommend benzodiazepine therapy for this patient given her substance abuse problems.  Follow-up in clinic in 4 weeks or sooner if needed.  February 24 at 2:20 PM  I have spent atleast 20 minutes non face to face with patient today. More than 50 % of the time was spent for ordering medications and test ,psychoeducation and supportive psychotherapy and care coordination,as well as documenting clinical information in electronic health record. This note was generated in part or whole with voice recognition software. Voice recognition is usually quite accurate but there are transcription errors that can and very often do occur. I apologize for any typographical errors that were not detected and corrected.       Ursula Alert, MD 06/09/2019, 6:02 PM

## 2019-06-15 ENCOUNTER — Encounter (HOSPITAL_COMMUNITY): Payer: Self-pay | Admitting: Psychology

## 2019-06-15 ENCOUNTER — Ambulatory Visit (INDEPENDENT_AMBULATORY_CARE_PROVIDER_SITE_OTHER): Payer: Medicaid Other | Admitting: Psychology

## 2019-06-15 ENCOUNTER — Other Ambulatory Visit: Payer: Self-pay

## 2019-06-15 DIAGNOSIS — F411 Generalized anxiety disorder: Secondary | ICD-10-CM | POA: Diagnosis not present

## 2019-06-15 DIAGNOSIS — F3176 Bipolar disorder, in full remission, most recent episode depressed: Secondary | ICD-10-CM

## 2019-06-15 DIAGNOSIS — Z634 Disappearance and death of family member: Secondary | ICD-10-CM | POA: Diagnosis not present
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
## 2019-06-17 ENCOUNTER — Telehealth: Payer: Self-pay

## 2019-06-17 DIAGNOSIS — F3176 Bipolar disorder, in full remission, most recent episode depressed: Secondary | ICD-10-CM

## 2019-06-17 DIAGNOSIS — F411 Generalized anxiety disorder: Secondary | ICD-10-CM

## 2019-06-17 MED ORDER — ARIPIPRAZOLE 20 MG PO TABS: 20.0000 mg | ORAL_TABLET | Freq: Every day | ORAL | 0 refills | Status: DC

## 2019-06-17 NOTE — Telephone Encounter (Signed)
Returned call to patient.  She reports she wants some Valium. Discussed with patient that I will not prescribe her Valium.  Discussed with her we can readjust her propranolol or readjust any of her other medications.  Discussed with her to continue psychotherapy sessions since she is struggling with a lot of psychosocial stressors at this time.  Patient resistant to continuing therapy or referral to IOP or PHP.  Discussed increasing her Abilify to 20 mg.  We will send it to her pharmacy.

## 2019-06-17 NOTE — Telephone Encounter (Signed)
pt called very upset and crying she states that she needs a medication adjustment. she states that her father died and she was not there when he died and she wasnt able to be there when they did the funeral. she also states that her and her husband is back together and that his sister is giving her a hard time and the sister will not even talk to her about what the problem is.

## 2019-06-17 NOTE — Telephone Encounter (Signed)
Pt has called again. °

## 2019-06-22 ENCOUNTER — Ambulatory Visit (INDEPENDENT_AMBULATORY_CARE_PROVIDER_SITE_OTHER): Payer: Medicaid Other | Admitting: Psychology

## 2019-06-22 ENCOUNTER — Telehealth: Payer: Self-pay

## 2019-06-22 ENCOUNTER — Other Ambulatory Visit: Payer: Self-pay

## 2019-06-22 DIAGNOSIS — F411 Generalized anxiety disorder: Secondary | ICD-10-CM | POA: Diagnosis not present

## 2019-06-22 DIAGNOSIS — F4321 Adjustment disorder with depressed mood: Secondary | ICD-10-CM | POA: Diagnosis not present

## 2019-06-22 DIAGNOSIS — F3176 Bipolar disorder, in full remission, most recent episode depressed: Secondary | ICD-10-CM | POA: Diagnosis not present

## 2019-06-22 DIAGNOSIS — Z634 Disappearance and death of family member: Secondary | ICD-10-CM | POA: Diagnosis not present

## 2019-06-22 NOTE — Progress Notes (Signed)
Virtual Visit via Telephone Note  I connected with Sargun Rummell Osley on 06/22/19 at  8:00 AM EST by telephone and verified that I am speaking with the correct person using two identifiers.   I discussed the limitations, risks, security and privacy concerns of performing an evaluation and management service by telephone and the availability of in person appointments. I also discussed with the patient that there Sipos be a patient responsible charge related to this service. The patient expressed understanding and agreed to proceed.    I discussed the assessment and treatment plan with the patient. The patient was provided an opportunity to ask questions and all were answered. The patient agreed with the plan and demonstrated an understanding of the instructions.   The patient was advised to call back or seek an in-person evaluation if the symptoms worsen or if the condition fails to improve as anticipated.  I provided 27 minutes of non-face-to-face time during this encounter.   Forde Radon [facility]    THERAPIST PROGRESS NOTE  Session Time: 8.03am-8.30am  Participation Level: Active  Behavioral Response: NAAlertaffect wnl  Type of Therapy: Individual Therapy  Treatment Goals addressed: Diagnosis: bereavement and bipolar and goal 1.  Interventions: CBT and Strength-based  Summary: Shelly Wright is a 49 y.o. female who presents with affect wnl.  pt reported that she is doing ok this week.  Pt reported that she stays in touch w/her stepmom by text daily.  Pt reported she is still ready to go visit but her stepmom and is still saying she has to wait.  Pt became emotional when stating she needs closure and not fair as she is the last child left.  Pt was receptive to ways of expressing w/ supports her and to keep communicatoin open w/her stepmom. Pt disucssed upcoming positives here w/ watching grandkids and oldest birthday celebration this weekend.  Pt did ask again about who can prescribe  medication and understood that is Dr. Elna Breslow as her psychiatrist.   Suicidal/Homicidal: Nowithout intent/plan  Therapist Response: Assessed pt current functioning per pt report.  Processed w/pt coping w/ grief re: father and ways of keeping open communication w/her stepmom.  Encouraged pt to utilize supports here for grieving and expressing her thoughts and feelings.  Discussed w/pt positives in her present relationships as well.   Plan: Return again in 2 weeks, via telephone.  Pt wasn't able to schedule until 1 month because needed certain days of the week.  Pt to f/u as scheduled w/ Dr. Elna Breslow in 3 weeks.  Diagnosis: Grief, Bipolar 1 d/o    Forde Radon [facility] 06/22/2019

## 2019-06-22 NOTE — Telephone Encounter (Signed)
Patient called and stated that her insurance [facility]) won't pay for her Venlafaxine XR 150mg . She requested an alternative that insurance will cover. Please review and advise. Thank you.

## 2019-06-22 NOTE — Telephone Encounter (Signed)
Shelly Wright could you please call her pharmacy and verify this. Not sure why they do not pay for it.

## 2019-06-23 ENCOUNTER — Encounter: Payer: Self-pay | Admitting: Psychiatry

## 2019-06-23 ENCOUNTER — Ambulatory Visit (INDEPENDENT_AMBULATORY_CARE_PROVIDER_SITE_OTHER): Payer: Medicaid Other | Admitting: Psychiatry

## 2019-06-23 DIAGNOSIS — F411 Generalized anxiety disorder: Secondary | ICD-10-CM | POA: Diagnosis not present

## 2019-06-23 DIAGNOSIS — F410 Panic disorder [episodic paroxysmal anxiety] without agoraphobia: Secondary | ICD-10-CM

## 2019-06-23 DIAGNOSIS — F17200 Nicotine dependence, unspecified, uncomplicated: Secondary | ICD-10-CM

## 2019-06-23 DIAGNOSIS — Z79899 Other long term (current) drug therapy: Secondary | ICD-10-CM

## 2019-06-23 DIAGNOSIS — F3162 Bipolar disorder, current episode mixed, moderate: Secondary | ICD-10-CM

## 2019-06-23 DIAGNOSIS — F1220 Cannabis dependence, uncomplicated: Secondary | ICD-10-CM | POA: Diagnosis not present

## 2019-06-23 HISTORY — DX: Other long term (current) drug therapy: Z79.899

## 2019-06-23 MED ORDER — VENLAFAXINE HCL ER 150 MG PO CP24: 150.0000 mg | ORAL_CAPSULE | Freq: Every day | ORAL | 0 refills | Status: DC

## 2019-06-23 MED ORDER — DIVALPROEX SODIUM 500 MG PO DR TAB: 500.0000 mg | DELAYED_RELEASE_TABLET | Freq: Two times a day (BID) | ORAL | 1 refills | Status: DC

## 2019-06-23 NOTE — Telephone Encounter (Signed)
I have called and left a message with pharmacy to verify why her Effexor is not affordable. Previously per CMA pt was trying to get an early refill which [facility] have contributed to insurance not covering it .  Will ask Lea to call patient to schedule an early appointment for medication problem.

## 2019-06-23 NOTE — Progress Notes (Signed)
Provider Location : [facility] Patient Location : Car  Virtual Visit via Video Note  I connected with Shelly Wright on 06/23/19 at  1:00 PM EST by a video enabled telemedicine application and verified that I am speaking with the correct person using two identifiers.   I discussed the limitations of evaluation and management by telemedicine and the availability of in person appointments. The patient expressed understanding and agreed to proceed.     I discussed the assessment and treatment plan with the patient. The patient was provided an opportunity to ask questions and all were answered. The patient agreed with the plan and demonstrated an understanding of the instructions.   The patient was advised to call back or seek an in-person evaluation if the symptoms worsen or if the condition fails to improve as anticipated.  BH MD OP Progress Note  06/23/2019 1:53 PM Shelly Wright  MRN:  [id number]  Chief Complaint:  Chief Complaint    Follow-up     HPI: Shelly Wright is a 49 year old Caucasian female, on SSI, lives in [place], has a history of bipolar disorder, GAD, cannabis use disorder, panic attacks, tobacco use disorder was evaluated by telemedicine today.  Patient reported she was in a public place and hence part of the evaluation had to be done by phone for privacy reasons. Writer had advised patient to make this appointment since she was having problems getting her venlafaxine prescription filled at her pharmacy.  Writer contacted patient's pharmacy prior to her appointment.  Spoke to pharmacist at [facility].  Per pharmacist patient is on above recommended dosage of venlafaxine and hence her health insurance plan will not cover it.  Writer discussed this with patient today.  Discussed with patient that in spite of being on high dosage of venlafaxine she continues to struggle with anxiety and mood symptoms.  Hence discussed with patient that her venlafaxine can be reduced to a lower  dosage and she can be started on a new mood stabilizer.  Discussed several mood stabilizers with patient today.  She reports she has never been tried on Depakote before.  Provided medication education.  She is agreeable to starting Depakote.  Patient reports she continues to struggle with mood lability, irritability, anxiety and nervousness.  She has a lot of psychosocial stressors.  She reports her sister-in-law currently lives in the same house and that has been very stressful for her.   Patient reports sleep is good.  Patient reports she has not yet started taking the higher dosage of Abilify, just picked it up from the pharmacy today.  Her Abilify was increased to 20 mg recently.  Patient encouraged to start the medication and let writer know if she has any concerns.  Patient denied any suicidality or perceptual disturbances.  Patient however reported she conflict with her sister-in-law.  When writer advised patient to elaborate on this patient reported that their relationship has been stressful.  She reports she keeps herself in her room and tries to stay away from her sister-in-law.  She denies any homicidality at this time.  Patient reports she continues to smoke cannabis-3-4 blunts per day.  She is not willing to quit.  Patient also continues to smoke cigarettes and is not willing to quit.  Patient reports she is currently in psychotherapy session and is agreeable to continue psychotherapy for her grief.  She continues to have no closure with regards to the death of her father who passed away in [place] recently.  She was  not allowed to attend the funeral by her stepmother which contributed to her worsening mood symptoms recently.     Visit Diagnosis:    ICD-10-CM   1. Bipolar disorder, current episode mixed, moderate (HCC)  F31.62 divalproex (DEPAKOTE) 500 MG DR tablet    Valproic acid level  2. GAD (generalized anxiety disorder)  F41.1 venlafaxine XR (EFFEXOR-XR) 150 MG 24 hr capsule   3. Panic attacks  F41.0   4. Cannabis use disorder, moderate, dependence (HCC)  F12.20   5. Tobacco use disorder  F17.200   6. High risk medication use  Z79.899 CMP and Liver    Valproic acid level    Past Psychiatric History: Reviewed past psychiatric history from my progress note on 09/16/2018.  Past trials of venlafaxine, trazodone, hydroxyzine  Past Medical History:  Past Medical History:  Diagnosis Date  . Acute right-sided low back pain with right-sided sciatica 08/30/2014  . Anxiety   . Arthritis   . Asthma    in past, no current inhalers  . Bipolar disorder (HCC)   . Cervical spinal cord compression (HCC) 05/07/2012  . COPD (chronic obstructive pulmonary disease) (HCC)   . DDD (degenerative disc disease), lumbar   . Depression   . Dyspnea    with exertion  . Dysrhythmia    irregular-h/o in the past  . Fibromyalgia   . Hypertension   . Neuritis or radiculitis due to rupture of lumbar intervertebral disc 05/23/2014  . Periprosthetic fracture around prosthetic knee 07/31/2015  . Restless leg syndrome   . Soft tissue lesion of shoulder region 01/24/2012  . 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 Syncope and collapse 04/08/2014  . Thoracic and lumbosacral neuritis 05/07/2012  . Thoracic neuritis 05/07/2012    Past Surgical History:  Procedure Laterality Date  . ABDOMINAL HYSTERECTOMY    . Cervical Fusion X 2    . JOINT REPLACEMENT Right 01/2016   Dr Gavin Potters  . KNEE ARTHROPLASTY Left 02/10/2019   Procedure: COMPUTER ASSISTED TOTAL KNEE ARTHROPLASTY - RNFA;  Surgeon: Donato Heinz, MD;  Location: [facility] ORS;  Service: Orthopedics;  Laterality: Left;  . KNEE ARTHROSCOPY Bilateral    Right knee scope 1998, Left knee scope  . TONSILLECTOMY    . TOTAL KNEE ARTHROPLASTY Right 02/01/2015   Procedure: TOTAL KNEE ARTHROPLASTY;  Surgeon: Erin Sons, MD;  Location: [facility] ORS;  Service: Orthopedics;   Laterality: Right;  . TUBAL LIGATION      Family Psychiatric History: Reviewed family psychiatric history from my progress note on 09/16/2018.  Family History:  Family History  Problem Relation Age of Onset  . Breast cancer Mother   . Colon cancer Father   . Heart attack Father   . Bipolar disorder Sister   . Depression Sister   . Schizophrenia Sister     Social History: Reviewed social history from my progress note on 09/16/2018. Social History   Socioeconomic History  . Marital status: Legally Separated    Spouse name: Not on file  . Number of children: 1  . Years of education: Not on file  . Highest education level: High school graduate  Occupational History  . Not on file  Tobacco Use  . Smoking status: Current Every Day Smoker    Packs/day: 1.00    Years: 30.00    Pack years: 30.00    Types: Cigarettes  . Smokeless tobacco: Never Used  Substance and Sexual Activity  .  Alcohol use: No  . Drug use: Yes    Types: Marijuana    Comment: every day  . Sexual activity: Yes  Other Topics Concern  . Not on file  Social History Narrative  . Not on file   Social Determinants of Health   Financial Resource Strain: Low Risk   . Difficulty of Paying Living Expenses: Not hard at all  Food Insecurity: No Food Insecurity  . Worried About Programme researcher, broadcasting/film/video in the Last Year: Never true  . Ran Out of Food in the Last Year: Never true  Transportation Needs: No Transportation Needs  . Lack of Transportation (Medical): No  . Lack of Transportation (Non-Medical): No  Physical Activity: Inactive  . Days of Exercise per Week: 0 days  . Minutes of Exercise per Session: 0 min  Stress: Stress Concern Present  . Feeling of Stress : To some extent  Social Connections: Unknown  . Frequency of Communication with Friends and Family: Not on file  . Frequency of Social Gatherings with Friends and Family: Not on file  . Attends Religious Services: Never  . Active Member of Clubs or  Organizations: No  . Attends Banker Meetings: Never  . Marital Status: Separated    Allergies: No Known Allergies  Metabolic Disorder Labs: No results found for: HGBA1C, MPG No results found for: PROLACTIN No results found for: CHOL, TRIG, HDL, CHOLHDL, VLDL, LDLCALC No results found for: TSH  Therapeutic Level Labs: No results found for: LITHIUM No results found for: VALPROATE No components found for:  CBMZ  Current Medications: Current Outpatient Medications  Medication Sig Dispense Refill  . albuterol (VENTOLIN HFA) 108 (90 Base) MCG/ACT inhaler Inhale 2 puffs into the lungs 4 (four) times daily.     . ARIPiprazole (ABILIFY) 20 MG tablet Take 1 tablet (20 mg total) by mouth daily. 90 tablet 0  . busPIRone (BUSPAR) 15 MG tablet Take 1 tablet (15 mg total) by mouth 3 (three) times daily. 270 tablet 0  . celecoxib (CELEBREX) 200 MG capsule Take 1 capsule (200 mg total) by mouth 2 (two) times daily. 60 capsule 1  . divalproex (DEPAKOTE) 500 MG DR tablet Take 1 tablet (500 mg total) by mouth 2 (two) times daily. For mood swings, anxiety 60 tablet 1  . enoxaparin (LOVENOX) 40 MG/0.4ML injection Inject 0.4 mLs (40 mg total) into the skin daily for 14 doses. 5.6 mL 0  . gabapentin (NEURONTIN) 800 MG tablet Take 1 tablet (800 mg total) by mouth every 6 (six) hours. 120 tablet 5  . hydrochlorothiazide (HYDRODIURIL) 25 MG tablet Take 25 mg by mouth every morning.   1  . lidocaine (XYLOCAINE) 5 % ointment Apply 1 application topically 4 (four) times daily as needed for moderate pain. (1application= 5 g, or 6 inches of ointment) Max: 20 g/day 50 g PRN  . methocarbamol (ROBAXIN) 750 MG tablet Take 1 tablet (750 mg total) by mouth 3 (three) times daily. Max: 3/day 90 tablet 5  . propranolol (INDERAL) 10 MG tablet TAKE 1 TABLET BY MOUTH THREE TIMES DAILYAS NEEDED FOR SEVERE PANIC ATTACKS ONLY 90 tablet 1  . simvastatin (ZOCOR) 20 MG tablet Take 20 mg by mouth at bedtime.     Marland Kitchen  tiotropium (SPIRIVA HANDIHALER) 18 MCG inhalation capsule Place 18 mcg into inhaler and inhale every morning.     . traZODone (DESYREL) 100 MG tablet Take 2 tablets (200 mg total) by mouth at bedtime as needed for sleep. 180 tablet  0  . venlafaxine XR (EFFEXOR-XR) 150 MG 24 hr capsule Take 1 capsule (150 mg total) by mouth daily with breakfast. 90 capsule 0   No current facility-administered medications for this visit.     Musculoskeletal: Strength & Muscle Tone: UTA Gait & Station: normal Patient leans: N/A  Psychiatric Specialty Exam: Review of Systems  Psychiatric/Behavioral: The patient is nervous/anxious.        Mood swings   All other systems reviewed and are negative.   There were no vitals taken for this visit.There is no height or weight on file to calculate BMI.  General Appearance: Casual  Eye Contact:  Fair  Speech:  Normal Rate  Volume:  Normal  Mood:  Mood swings  Affect:  Appropriate  Thought Process:  Goal Directed and Descriptions of Associations: Intact  Orientation:  Full (Time, Place, and Person)  Thought Content: Logical   Suicidal Thoughts:  No  Homicidal Thoughts:  No  Memory:  Immediate;   Fair Recent;   Fair Remote;   Fair  Judgement:  Fair  Insight:  Fair  Psychomotor Activity:  Normal  Concentration:  Concentration: Fair and Attention Span: Fair  Recall:  FiservFair  Fund of Knowledge: Fair  Language: Fair  Akathisia:  No  Handed:  Right  AIMS (if indicated): denies tremors, rigidity  Assets:  Communication Skills Desire for Improvement Housing Social Support  ADL's:  Intact  Cognition: WNL  Sleep:  Fair   Screenings: PHQ2-9     Clinical [facility] Procedure visit from [facility]14[facility]2016 [facility] Office Visit from 07[facility]09/2016 in [facility] Office [facility] from [facility]  PHQ-2 Total Score  0  0  0  0  0       Assessment and Plan: Shelly PortsValerie is a 49 year old Caucasian female on SSI, [place], has a history of bipolar disorder, anxiety disorder, tobacco use disorder, asthma, COPD, chronic pain was evaluated by telemedicine today.  Patient is biologically predisposed given her family history of mental health problems as well as her own substance abuse problems.  Patient with psychosocial stressors of trauma, history of chronic pain, recent death of her father.  Patient very resistant to making any changes with substance abuse, continues to struggle with mood symptoms.  Patient also continues to struggle with her grief.  Patient will benefit from the following medication changes and continued psychotherapy sessions.  Plan as noted below.  Plan Bipolar disorder-unstable Abilify 20 mg p.o. daily Start Depakote DR 500 mg p.o. twice daily. Will order Depakote level as well as CMP/liver.  [facility] Trazodone 200 mg p.o. nightly for sleep  GAD-unstable Reduce venlafaxine to extended release 150 mg p.o. daily BuSpar 15 mg p.o. 3 times daily Propranolol 10 mg p.o. 3 times daily as needed  Cannabis use disorder-moderate-unstable Provided substance abuse counseling, she is not willing to quit  Tobacco use disorder-unstable She is not ready to quit Provided smoking cessation counseling  Patient encouraged to continue to work with her therapist- Ms.Yates.  I have communicated with Ms. Yates for coordination of care.  I have contacted patient's pharmacy-spoke to pharmacist about her venlafaxine as summarized above.  Follow-up up in clinic in 3 to 4 weeks or sooner if needed.  Patient has upcoming  appointment on February 24.  I have spent atleast 30 minutes non face to face with patient today. More than 50 % of the time  was spent for preparing to see the patient ( e.g., review of test, records ), obtaining and to review and separately obtained history , ordering medications and test ,psychoeducation and supportive psychotherapy and care coordination,as well as documenting clinical information in electronic health record,interpreting results of test and communication of results This note was generated in part or whole with voice recognition software. Voice recognition is usually quite accurate but there are transcription errors that can and very often do occur. I apologize for any typographical errors that were not detected and corrected.       Ursula Alert, MD 06/23/2019, 1:53 PM

## 2019-06-28 ENCOUNTER — Telehealth: Payer: Self-pay

## 2019-06-28 DIAGNOSIS — F3162 Bipolar disorder, current episode mixed, moderate: Secondary | ICD-10-CM | POA: Insufficient documentation

## 2019-06-28 MED ORDER — OXCARBAZEPINE 150 MG PO TABS: 150.0000 mg | ORAL_TABLET | Freq: Two times a day (BID) | ORAL | 0 refills | Status: DC

## 2019-06-28 NOTE — Telephone Encounter (Signed)
Returned call to patient.  She reports she is having GI side effects from the Depakote.  Discontinue Depakote. Start Trileptal 150 mg twice a day.  Provided medication education.  Advised patient to continue to work with her therapist.

## 2019-06-28 NOTE — Telephone Encounter (Signed)
Medication problem - Patient called to report the Depakote ER she started had caused GI upset all this past weekend and that she stopped taking it 06/27/19.  Requests something different since Medicaid will not pay for 3 Venlafaxine XR capsules per day.  Agreed to send message to Dr. Elna Breslow and will call her back once she has reviewed.

## 2019-07-14 ENCOUNTER — Ambulatory Visit (INDEPENDENT_AMBULATORY_CARE_PROVIDER_SITE_OTHER): Payer: Medicaid Other | Admitting: Psychiatry

## 2019-07-14 ENCOUNTER — Encounter: Payer: Self-pay | Admitting: Psychiatry

## 2019-07-14 ENCOUNTER — Other Ambulatory Visit: Payer: Self-pay

## 2019-07-14 DIAGNOSIS — F1220 Cannabis dependence, uncomplicated: Secondary | ICD-10-CM | POA: Diagnosis not present

## 2019-07-14 DIAGNOSIS — F17200 Nicotine dependence, unspecified, uncomplicated: Secondary | ICD-10-CM

## 2019-07-14 DIAGNOSIS — F3162 Bipolar disorder, current episode mixed, moderate: Secondary | ICD-10-CM

## 2019-07-14 DIAGNOSIS — F411 Generalized anxiety disorder: Secondary | ICD-10-CM

## 2019-07-14 DIAGNOSIS — F410 Panic disorder [episodic paroxysmal anxiety] without agoraphobia: Secondary | ICD-10-CM | POA: Diagnosis not present

## 2019-07-14 MED ORDER — OXCARBAZEPINE 300 MG PO TABS: 300.0000 mg | ORAL_TABLET | Freq: Two times a day (BID) | ORAL | 0 refills | Status: DC

## 2019-07-14 NOTE — Progress Notes (Signed)
Provider Location : [facility] Patient Location : Car  Virtual Visit via Video Note  I connected with Shelly Wright on 07/14/19 at  2:20 PM EST by a video enabled telemedicine application and verified that I am speaking with the correct person using two identifiers.   I discussed the limitations of evaluation and management by telemedicine and the availability of in person appointments. The patient expressed understanding and agreed to proceed.    I discussed the assessment and treatment plan with the patient. The patient was provided an opportunity to ask questions and all were answered. The patient agreed with the plan and demonstrated an understanding of the instructions.   The patient was advised to call back or seek an in-person evaluation if the symptoms worsen or if the condition fails to improve as anticipated.  BH MD/PA/NP OP Progress Note  07/14/2019 6:03 PM [name]  MRN:  [id number]  Chief Complaint:  Chief Complaint    Follow-up     HPI: Shelly Wright is a 49 year old Caucasian female, on SSI, lives in [place], has a history of bipolar disorder, GAD, cannabis use disorder, panic attacks, tobacco use disorder was evaluated by telemedicine today.  Patient was shopping when writer called and was advised to go to a private place where she could have the video evaluation.  Patient agreed to go to her car.  Patient today reports she continues to struggle with mood lability.  She is tolerating the Trileptal well.  Patient reports sleep is good on the trazodone.  She continues to have psychosocial stressors of situational problems at home, relationship struggles with her sister-in-law.  Patient however reports she is currently in psychotherapy sessions and that is beneficial.  She denies any suicidality, homicidality or perceptual disturbances.  She continues to smoke cigarettes and use cannabis and is not ready to quit.   Visit Diagnosis:    ICD-10-CM   1. Bipolar  disorder, current episode mixed, moderate (HCC)  F31.62 Oxcarbazepine (TRILEPTAL) 300 MG tablet  2. GAD (generalized anxiety disorder)  F41.1   3. Panic attacks  F41.0   4. Cannabis use disorder, moderate, dependence (HCC)  F12.20   5. Tobacco use disorder  F17.200     Past Psychiatric History: I have reviewed past psychiatric history from my progress note on 09/16/2018.  Past trials of venlafaxine, trazodone, hydroxyzine.  Past Medical History:  Past Medical History:  Diagnosis Date  . Acute right-sided low back pain with right-sided sciatica 08/30/2014  . Anxiety   . Arthritis   . Asthma    in past, no current inhalers  . Bipolar disorder ([place])   . Cervical spinal cord compression ([place]) 05/07/2012  . COPD (chronic obstructive pulmonary disease) ([place])   . DDD (degenerative disc disease), lumbar   . Depression   . Dyspnea    with exertion  . Dysrhythmia    irregular-h/o in the past  . Fibromyalgia   . Hypertension   . Neuritis or radiculitis due to rupture of lumbar intervertebral disc 05/23/2014  . Periprosthetic fracture around prosthetic knee 07/31/2015  . Restless leg syndrome   . Soft tissue lesion of shoulder region 01/24/2012  . 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 Syncope and collapse 04/08/2014  . Thoracic and lumbosacral neuritis 05/07/2012  . Thoracic neuritis 05/07/2012    Past Surgical History:  Procedure Laterality Date  . ABDOMINAL HYSTERECTOMY    . Cervical Fusion X 2    .  JOINT REPLACEMENT Right 01/2016   Dr Gavin Potters  . KNEE ARTHROPLASTY Left 02/10/2019   Procedure: COMPUTER ASSISTED TOTAL KNEE ARTHROPLASTY - RNFA;  Surgeon: Donato Heinz, MD;  Location: [facility] ORS;  Service: Orthopedics;  Laterality: Left;  . KNEE ARTHROSCOPY Bilateral    Right knee scope 1998, Left knee scope  . TONSILLECTOMY    . TOTAL KNEE ARTHROPLASTY Right 02/01/2015   Procedure: TOTAL KNEE ARTHROPLASTY;   Surgeon: Erin Sons, MD;  Location: [facility] ORS;  Service: Orthopedics;  Laterality: Right;  . TUBAL LIGATION      Family Psychiatric History: I have reviewed family psychiatric history from my progress note on 09/16/2018.  Family History:  Family History  Problem Relation Age of Onset  . Breast cancer Mother   . Colon cancer Father   . Heart attack Father   . Bipolar disorder Sister   . Depression Sister   . Schizophrenia Sister     Social History: Reviewed social history from my progress note on 09/16/2018. Social History   Socioeconomic History  . Marital status: Legally Separated    Spouse name: Not on file  . Number of children: 1  . Years of education: Not on file  . Highest education level: High school graduate  Occupational History  . Not on file  Tobacco Use  . Smoking status: Current Every Day Smoker    Packs/day: 1.00    Years: 30.00    Pack years: 30.00    Types: Cigarettes  . Smokeless tobacco: Never Used  Substance and Sexual Activity  . Alcohol use: No  . Drug use: Yes    Types: Marijuana    Comment: every day  . Sexual activity: Yes  Other Topics Concern  . Not on file  Social History Narrative  . Not on file   Social Determinants of Health   Financial Resource Strain: Low Risk   . Difficulty of Paying Living Expenses: Not hard at all  Food Insecurity: No Food Insecurity  . Worried About Programme researcher, broadcasting/film/video in the Last Year: Never true  . Ran Out of Food in the Last Year: Never true  Transportation Needs: No Transportation Needs  . Lack of Transportation (Medical): No  . Lack of Transportation (Non-Medical): No  Physical Activity: Inactive  . Days of Exercise per Week: 0 days  . Minutes of Exercise per Session: 0 min  Stress: Stress Concern Present  . Feeling of Stress : To some extent  Social Connections: Unknown  . Frequency of Communication with Friends and Family: Not on file  . Frequency of Social Gatherings with Friends and Family:  Not on file  . Attends Religious Services: Never  . Active Member of Clubs or Organizations: No  . Attends Banker Meetings: Never  . Marital Status: Separated    Allergies: No Known Allergies  Metabolic Disorder Labs: No results found for: HGBA1C, MPG No results found for: PROLACTIN No results found for: CHOL, TRIG, HDL, CHOLHDL, VLDL, LDLCALC No results found for: TSH  Therapeutic Level Labs: No results found for: LITHIUM No results found for: VALPROATE No components found for:  CBMZ  Current Medications: Current Outpatient Medications  Medication Sig Dispense Refill  . albuterol (VENTOLIN HFA) 108 (90 Base) MCG/ACT inhaler Inhale 2 puffs into the lungs 4 (four) times daily.     . ARIPiprazole (ABILIFY) 20 MG tablet Take 1 tablet (20 mg total) by mouth daily. 90 tablet 0  . busPIRone (BUSPAR) 15 MG tablet Take 1  tablet (15 mg total) by mouth 3 (three) times daily. 270 tablet 0  . celecoxib (CELEBREX) 200 MG capsule Take 1 capsule (200 mg total) by mouth 2 (two) times daily. 60 capsule 1  . enoxaparin (LOVENOX) 40 MG/0.4ML injection Inject 0.4 mLs (40 mg total) into the skin daily for 14 doses. 5.6 mL 0  . gabapentin (NEURONTIN) 800 MG tablet Take 1 tablet (800 mg total) by mouth every 6 (six) hours. 120 tablet 5  . hydrochlorothiazide (HYDRODIURIL) 25 MG tablet Take 25 mg by mouth every morning.   1  . lidocaine (XYLOCAINE) 5 % ointment Apply 1 application topically 4 (four) times daily as needed for moderate pain. (1application= 5 g, or 6 inches of ointment) Max: 20 g/day 50 g PRN  . methocarbamol (ROBAXIN) 750 MG tablet Take 1 tablet (750 mg total) by mouth 3 (three) times daily. Max: 3/day 90 tablet 5  . Oxcarbazepine (TRILEPTAL) 300 MG tablet Take 1 tablet (300 mg total) by mouth 2 (two) times daily. 180 tablet 0  . propranolol (INDERAL) 10 MG tablet TAKE 1 TABLET BY MOUTH THREE TIMES DAILYAS NEEDED FOR SEVERE PANIC ATTACKS ONLY 90 tablet 1  . simvastatin (ZOCOR)  20 MG tablet Take 20 mg by mouth at bedtime.     Marland Kitchen tiotropium (SPIRIVA HANDIHALER) 18 MCG inhalation capsule Place 18 mcg into inhaler and inhale every morning.     . traZODone (DESYREL) 100 MG tablet Take 2 tablets (200 mg total) by mouth at bedtime as needed for sleep. 180 tablet 0  . venlafaxine XR (EFFEXOR-XR) 150 MG 24 hr capsule Take 1 capsule (150 mg total) by mouth daily with breakfast. 90 capsule 0   No current facility-administered medications for this visit.     Musculoskeletal: Strength & Muscle Tone: UTA Gait & Station: normal Patient leans: N/A  Psychiatric Specialty Exam: Review of Systems  Psychiatric/Behavioral:       Mood swings  All other systems reviewed and are negative.   There were no vitals taken for this visit.There is no height or weight on file to calculate BMI.  General Appearance: Casual  Eye Contact:  Fair  Speech:  Clear and Coherent  Volume:  Normal  Mood:  Irritable  Affect:  Congruent  Thought Process:  Goal Directed and Descriptions of Associations: Intact  Orientation:  Full (Time, Place, and Person)  Thought Content: Logical   Suicidal Thoughts:  No  Homicidal Thoughts:  No  Memory:  Immediate;   Fair Recent;   Fair Remote;   Fair  Judgement:  Fair  Insight:  Fair  Psychomotor Activity:  Normal  Concentration:  Concentration: Fair and Attention Span: Fair  Recall:  [employer] of Knowledge: Fair  Language: Fair  Akathisia:  No  Handed:  Right  AIMS (if indicated): UTA  Assets:  Communication Skills Desire for Improvement Housing Social Support  ADL's:  Intact  Cognition: WNL  Sleep:  Fair   Screenings: PHQ2-9     Clinical Support from 08/26/2017 in [facility] Office Visit from 09/11/2016 in [facility] Procedure visit from 08[facility]14/2016 in [facility] Office Visit from 07/09/2016 in [facility] Office Visit from 05/06/2016 in [facility]  PHQ-2 Total Score  0  0  0  0  0       Assessment and Plan: Kelley is a 49 year old  Caucasian female, on SSI, lives in [place], has a history of bipolar disorder, anxiety disorder, tobacco use disorder, asthma, COPD, chronic pain was evaluated by telemedicine today.  Patient is biologically predisposed given her family history of mental health problems as well as her own substance abuse problems.  Patient with psychosocial stressors of trauma, history of chronic pain, recent death of her father and relationship struggles.  She continues to struggle with mood lability and will continue to benefit from medication readjustment and psychotherapy sessions.  Plan as noted below.  Plan Bipolar disorder-some progress Abilify 20 mg p.o. daily. Increase Trileptal to 300 mg p.o. twice daily Trazodone 200 mg p.o. nightly for sleep.  GAD-improving Venlafaxine extended release 150 mg p.o. daily BuSpar 15 mg p.o. 3 times daily Propranolol 10 mg p.o. 3 times daily as needed  Cannabis use disorder-moderate-unstable Patient is not willing to quit.  Tobacco use disorder-unstable She is not ready to quit.  Provided smoking cessation counseling.  Follow-up in clinic in 3 to 4 weeks or sooner if needed.  Patient to continue psychotherapy sessions with Ms. Yates.  I have spent atleast 20 minutes non face to face with patient today. More than 50 % of the time was spent for  obtaining and to review and separately obtained history , ordering medications and test ,psychoeducation and supportive psychotherapy and care coordination,as well as documenting clinical information in electronic health record. This note was generated in part or whole with voice recognition software. Voice recognition is usually quite accurate but there are transcription errors that can and very often do occur. I  apologize for any typographical errors that were not detected and corrected.        Jomarie Longs, MD 07/14/2019, 6:03 PM

## 2019-07-19 ENCOUNTER — Ambulatory Visit (INDEPENDENT_AMBULATORY_CARE_PROVIDER_SITE_OTHER): Payer: Medicaid Other | Admitting: Psychology

## 2019-07-19 ENCOUNTER — Other Ambulatory Visit: Payer: Self-pay

## 2019-07-19 DIAGNOSIS — Z634 Disappearance and death of family member: Secondary | ICD-10-CM

## 2019-07-19 DIAGNOSIS — F3162 Bipolar disorder, current episode mixed, moderate: Secondary | ICD-10-CM

## 2019-07-19 NOTE — Progress Notes (Signed)
Virtual Visit via Telephone Note  I connected with Shelly Wright on 07/19/19 at  8:00 AM EST by telephone and verified that I am speaking with the correct person using two identifiers.   I discussed the limitations, risks, security and privacy concerns of performing an evaluation and management service by telephone and the availability of in person appointments. I also discussed with the patient that there [place] be a patient responsible charge related to this service. The patient expressed understanding and agreed to proceed.    I discussed the assessment and treatment plan with the patient. The patient was provided an opportunity to ask questions and all were answered. The patient agreed with the plan and demonstrated an understanding of the instructions.   The patient was advised to call back or seek an in-person evaluation if the symptoms worsen or if the condition fails to improve as anticipated.  I provided 30 minutes of non-face-to-face time during this encounter.   Forde Radon [facility]    THERAPIST PROGRESS NOTE  Session Time: 8am-8.30am  Participation Level: Active  Behavioral Response: NAAlertaffect wnl  Type of Therapy: Individual Therapy  Treatment Goals addressed: Diagnosis: bipolar and grief and goal 1.  Interventions: CBT and Supportive  Summary: Shelly Wright is a 49 y.o. female who presents with affect wnl.  Pt was tearful when discussed her father.  Pt reported that things are going ok and some things better.  Pt reported that her sister in law is moving out of the house and this is one less stressor.  Pt reported that things w/ husband are continuing to go well.  Pt reported that she has a vehicle to drive now as well- daughter passed on an old vehicle.  Pt reports that she is keeping communication w/her stepmom through text and this has been good.  Pt reported that should be able to go down in a month or 2.  Pt receptive to idea of other ways to seek closure other  things she can do that feel meaningful since wasn't able to see father's body. .   Suicidal/Homicidal: Nowithout intent/plan  Therapist Response: Assessed pt current functionig per pt report.  Processed w/pt coping w/ stressors and grief.  Reflected some improvements and being able to take things day to day.  Explored w/pt her communciation w/ stepmom and considering other ways of memorializing father since unable to take part in funeral.  Plan: Return again in 2 weeks, via telephone.  F/u as scheduled w/ Dr. Elna Breslow. Diagnosis: Bipolar 1 d/o and bereavemnt    Terrian Ridlon, [facility] 07/19/2019

## 2019-07-20 ENCOUNTER — Other Ambulatory Visit: Payer: Self-pay | Admitting: Psychiatry

## 2019-07-20 DIAGNOSIS — F3176 Bipolar disorder, in full remission, most recent episode depressed: Secondary | ICD-10-CM

## 2019-07-20 DIAGNOSIS — F411 Generalized anxiety disorder: Secondary | ICD-10-CM

## 2019-07-20 DIAGNOSIS — F3130 Bipolar disorder, current episode depressed, mild or moderate severity, unspecified: Secondary | ICD-10-CM

## 2019-07-20 NOTE — Telephone Encounter (Signed)
Received refill request for Abilify, BuSpar, venlafaxine, trazodone however per review of medication list all her medications were sent out for 90-day supply recently and she is not due for a refill yet.

## 2019-08-04 ENCOUNTER — Ambulatory Visit (INDEPENDENT_AMBULATORY_CARE_PROVIDER_SITE_OTHER): Payer: Medicaid Other | Admitting: Psychology

## 2019-08-04 ENCOUNTER — Other Ambulatory Visit: Payer: Self-pay

## 2019-08-04 DIAGNOSIS — F3162 Bipolar disorder, current episode mixed, moderate: Secondary | ICD-10-CM | POA: Diagnosis not present

## 2019-08-04 DIAGNOSIS — Z634 Disappearance and death of family member: Secondary | ICD-10-CM

## 2019-08-04 NOTE — Progress Notes (Signed)
Virtual Visit via Telephone Note  I connected with Shelly Wright on 08/04/19 at  8:00 AM EDT by telephone and verified that I am speaking with the correct person using two identifiers.   I discussed the limitations, risks, security and privacy concerns of performing an evaluation and management service by telephone and the availability of in person appointments. I also discussed with the patient that there [place] be a patient responsible charge related to this service. The patient expressed understanding and agreed to proceed.    I discussed the assessment and treatment plan with the patient. The patient was provided an opportunity to ask questions and all were answered. The patient agreed with the plan and demonstrated an understanding of the instructions.   The patient was advised to call back or seek an in-person evaluation if the symptoms worsen or if the condition fails to improve as anticipated.  I provided 20 minutes of non-face-to-face time during this encounter.   Forde Radon [facility]    THERAPIST PROGRESS NOTE  Session Time: 8.06am-8.26am  Participation Level: Active  Behavioral Response: Well GroomedAlertaffect wnl  Type of Therapy: Individual Therapy  Treatment Goals addressed: Diagnosis: bipolar 1 d/o, bereavement and goal 1.  Interventions: Strength-based, Supportive and Other: boundary setting  Summary: Shelly Wright is a 49 y.o. female who presents with affect wnl.  pt reported that she has been doing well in past week.  Pt reported that less stressed w/ sister-in-law moving out- pt expressed not understanding after being best friends for so long how things change.  Pt reported that keeping in touch w/ stepmom and that has been ok- hoping to go down in a month.  Pt reported at times grief coming up other times doing ok w/ mood.  Pt reported that has stress on whether her nephew will pay the bills as agreed for staying in her house.  Pt reported that she has expressed  expectations and will he will have to leave if not and will sell.  Pt feels that she is managing stress and grief ok currently.   Suicidal/Homicidal: Nowithout intent/plan  Therapist Response: Assessed pt current functioning per pt report.  Processed w/pt coping w/ stressors and how she is managing through.  disuse keeping healthy boundaries in relationships.     Plan: Return again in 30month, via webex.  Diagnosis: Bipolar 1 d/o and bereavment.  Forde Radon [facility] 08/04/2019

## 2019-08-10 ENCOUNTER — Other Ambulatory Visit: Payer: Self-pay

## 2019-08-10 ENCOUNTER — Ambulatory Visit (INDEPENDENT_AMBULATORY_CARE_PROVIDER_SITE_OTHER): Payer: Medicaid Other | Admitting: Psychiatry

## 2019-08-10 ENCOUNTER — Encounter: Payer: Self-pay | Admitting: Psychiatry

## 2019-08-10 DIAGNOSIS — F1220 Cannabis dependence, uncomplicated: Secondary | ICD-10-CM | POA: Diagnosis not present

## 2019-08-10 DIAGNOSIS — F17200 Nicotine dependence, unspecified, uncomplicated: Secondary | ICD-10-CM

## 2019-08-10 DIAGNOSIS — F410 Panic disorder [episodic paroxysmal anxiety] without agoraphobia: Secondary | ICD-10-CM

## 2019-08-10 DIAGNOSIS — F3162 Bipolar disorder, current episode mixed, moderate: Secondary | ICD-10-CM | POA: Diagnosis not present

## 2019-08-10 DIAGNOSIS — F411 Generalized anxiety disorder: Secondary | ICD-10-CM

## 2019-08-10 DIAGNOSIS — Z79899 Other long term (current) drug therapy: Secondary | ICD-10-CM

## 2019-08-10 MED ORDER — ARIPIPRAZOLE 20 MG PO TABS: 20.0000 mg | ORAL_TABLET | Freq: Every day | ORAL | 0 refills | Status: DC

## 2019-08-10 MED ORDER — PROPRANOLOL HCL 10 MG PO TABS: ORAL_TABLET | ORAL | 1 refills | Status: DC

## 2019-08-10 MED ORDER — TRAZODONE HCL 100 MG PO TABS: 200.0000 mg | ORAL_TABLET | Freq: Every evening | ORAL | 0 refills | Status: DC | PRN

## 2019-08-10 MED ORDER — OXCARBAZEPINE 300 MG PO TABS: 900.0000 mg | ORAL_TABLET | Freq: Every day | ORAL | 0 refills | Status: DC

## 2019-08-10 MED ORDER — BUSPIRONE HCL 15 MG PO TABS: 15.0000 mg | ORAL_TABLET | Freq: Three times a day (TID) | ORAL | 0 refills | Status: DC

## 2019-08-10 NOTE — Progress Notes (Signed)
Provider Location : [facility] Patient Location : Car  Virtual Visit via Video Note  I connected with Shelly Wright on 08/10/19 at 10:20 AM EDT by a video enabled telemedicine application and verified that I am speaking with the correct person using two identifiers.   I discussed the limitations of evaluation and management by telemedicine and the availability of in person appointments. The patient expressed understanding and agreed to proceed.    I discussed the assessment and treatment plan with the patient. The patient was provided an opportunity to ask questions and all were answered. The patient agreed with the plan and demonstrated an understanding of the instructions.   The patient was advised to call back or seek an in-person evaluation if the symptoms worsen or if the condition fails to improve as anticipated.   [facility] MD OP Progress Note  08/10/2019 10:32 AM Shelly Wright  MRN:  [id number]  Chief Complaint:  Chief Complaint    Follow-up     HPI: Shelly Wright is a 49 year old Caucasian female on SSI, lives in [place], has a history of bipolar disorder, GAD, cannabis use disorder, panic attacks, tobacco use disorder was evaluated by telemedicine today.  Patient today reports she continues to struggle with the loss of her dad.  She reports she is planning to visit her dad's place in the next month.  She looks forward to that.  She wants to find some closure with her dad's passing and has not been able to find it yet.  She continues to follow-up with her therapist and reports therapy sessions is beneficial.  She continues to struggle with mood lability and anxiety symptoms although they are improving.  She is compliant on the Trileptal as prescribed.  She is also compliant on her other medications and denies side effects.  Patient reports sleep is good on the trazodone.  Patient denies any suicidality, homicidality or perceptual disturbances.  Patient continues to smoke cannabis and is  not ready to quit.  Patient continues to smoke cigarettes and is not ready to quit.  Discussed with patient that since she is on the Trileptal, she needs lab work.  She agrees with it. Visit Diagnosis:    ICD-10-CM   1. Bipolar disorder, current episode mixed, moderate (HCC)  F31.62 Oxcarbazepine (TRILEPTAL) 300 MG tablet  2. GAD (generalized anxiety disorder)  F41.1 ARIPiprazole (ABILIFY) 20 MG tablet    traZODone (DESYREL) 100 MG tablet  3. Panic attacks  F41.0 busPIRone (BUSPAR) 15 MG tablet    propranolol (INDERAL) 10 MG tablet  4. Cannabis use disorder, moderate, dependence (HCC)  F12.20   5. Tobacco use disorder  F17.200   6. High risk medication use  Z79.899 CMP and Liver    CBC With Differential    Past Psychiatric History: I have reviewed past psychiatric history from my progress note on 09/16/2018.  Past trials of venlafaxine, trazodone, hydroxyzine  Past Medical History:  Past Medical History:  Diagnosis Date  . Acute right-sided low back pain with right-sided sciatica 08/30/2014  . Anxiety   . Arthritis   . Asthma    in past, no current inhalers  . Bipolar disorder ([place])   . Cervical spinal cord compression ([place]) 05/07/2012  . COPD (chronic obstructive pulmonary disease) ([place])   . DDD (degenerative disc disease), lumbar   . Depression   . Dyspnea    with exertion  . Dysrhythmia    irregular-h/o in the past  . Fibromyalgia   . Hypertension   .  Neuritis or radiculitis due to rupture of lumbar intervertebral disc 05/23/2014  . Periprosthetic fracture around prosthetic knee 07/31/2015  . Restless leg syndrome   . Soft tissue lesion of shoulder region 01/24/2012  . 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 Syncope and collapse 04/08/2014  . Thoracic and lumbosacral neuritis 05/07/2012  . Thoracic neuritis 05/07/2012    Past Surgical History:  Procedure Laterality Date  . ABDOMINAL HYSTERECTOMY     . Cervical Fusion X 2    . JOINT REPLACEMENT Right 01/2016   Dr Gavin Potters  . KNEE ARTHROPLASTY Left 02/10/2019   Procedure: COMPUTER ASSISTED TOTAL KNEE ARTHROPLASTY - RNFA;  Surgeon: Donato Heinz, MD;  Location: [facility] OR[facility];  Service: Orthopedics;  Laterality: Left;  . KNEE ARTHROSCOPY Bilateral    Right knee scope 1998, Left knee scope  . TONSILLECTOMY    . TOTAL KNEE ARTHROPLASTY Right 02/01/2015   Procedure: TOTAL KNEE ARTHROPLASTY;  Surgeon: Erin Sons, MD;  Location: [facility] ORS;  Service: Orthopedics;  Laterality: Right;  . TUBAL LIGATION      Family Psychiatric History: I have reviewed family psychiatric history from my progress note on 09/16/2018  Family History:  Family History  Problem Relation Age of Onset  . Breast cancer Mother   . Colon cancer Father   . Heart attack Father   . Bipolar disorder Sister   . Depression Sister   . Schizophrenia Sister     Social History: I have reviewed social history from my progress note on 09/16/2018 Social History   Socioeconomic History  . Marital status: Legally Separated    Spouse name: Not on file  . Number of children: 1  . Years of education: Not on file  . Highest education level: High school graduate  Occupational History  . Not on file  Tobacco Use  . Smoking status: Current Every Day Smoker    Packs/day: 1.00    Years: 30.00    Pack years: 30.00    Types: Cigarettes  . Smokeless tobacco: Never Used  Substance and Sexual Activity  . Alcohol use: No  . Drug use: Yes    Types: Marijuana    Comment: every day  . Sexual activity: Yes  Other Topics Concern  . Not on file  Social History Narrative  . Not on file   Social Determinants of Health   Financial Resource Strain: Low Risk   . Difficulty of Paying Living Expenses: Not hard at all  Food Insecurity: No Food Insecurity  . Worried About Programme researcher, broadcasting/film/video in the Last Year: Never true  . Ran Out of Food in the Last Year: Never true  Transportation  Needs: No Transportation Needs  . Lack of Transportation (Medical): No  . Lack of Transportation (Non-Medical): No  Physical Activity: Inactive  . Days of Exercise per Week: 0 days  . Minutes of Exercise per Session: 0 min  Stress: Stress Concern Present  . Feeling of Stress : To some extent  Social Connections: Unknown  . Frequency of Communication with Friends and Family: Not on file  . Frequency of Social Gatherings with Friends and Family: Not on file  . Attends Religious Services: Never  . Active Member of Clubs or Organizations: No  . Attends Banker Meetings: Never  . Marital Status: Separated    Allergies: No Known Allergies  Metabolic Disorder Labs: No results found for: HGBA1C, MPG No results  found for: PROLACTIN No results found for: CHOL, TRIG, HDL, CHOLHDL, VLDL, LDLCALC No results found for: TSH  Therapeutic Level Labs: No results found for: LITHIUM No results found for: VALPROATE No components found for:  CBMZ  Current Medications: Current Outpatient Medications  Medication Sig Dispense Refill  . albuterol (VENTOLIN HFA) 108 (90 Base) MCG/ACT inhaler Inhale 2 puffs into the lungs 4 (four) times daily.     . ARIPiprazole (ABILIFY) 20 MG tablet Take 1 tablet (20 mg total) by mouth daily. 90 tablet 0  . busPIRone (BUSPAR) 15 MG tablet Take 1 tablet (15 mg total) by mouth 3 (three) times daily. 270 tablet 0  . celecoxib (CELEBREX) 200 MG capsule Take 1 capsule (200 mg total) by mouth 2 (two) times daily. 60 capsule 1  . enoxaparin (LOVENOX) 40 MG/0.4ML injection Inject 0.4 mLs (40 mg total) into the skin daily for 14 doses. 5.6 mL 0  . gabapentin (NEURONTIN) 800 MG tablet Take 1 tablet (800 mg total) by mouth every 6 (six) hours. 120 tablet 5  . hydrochlorothiazide (HYDRODIURIL) 25 MG tablet Take 25 mg by mouth every morning.   1  . lidocaine (XYLOCAINE) 5 % ointment Apply 1 application topically 4 (four) times daily as needed for moderate pain.  (1application= 5 g, or 6 inches of ointment) Max: 20 g/day 50 g PRN  . methocarbamol (ROBAXIN) 750 MG tablet Take 1 tablet (750 mg total) by mouth 3 (three) times daily. Max: 3/day 90 tablet 5  . Oxcarbazepine (TRILEPTAL) 300 MG tablet Take 3 tablets (900 mg total) by mouth daily. Take 300 mg daily in the AM and 600 mg PM. 270 tablet 0  . propranolol (INDERAL) 10 MG tablet TAKE 1 TABLET BY MOUTH THREE TIMES DAILYAS NEEDED FOR SEVERE PANIC ATTACKS ONLY 90 tablet 1  . simvastatin (ZOCOR) 20 MG tablet Take 20 mg by mouth at bedtime.     Marland Kitchen tiotropium (SPIRIVA HANDIHALER) 18 MCG inhalation capsule Place 18 mcg into inhaler and inhale every morning.     . traZODone (DESYREL) 100 MG tablet Take 2 tablets (200 mg total) by mouth at bedtime as needed for sleep. 180 tablet 0  . venlafaxine XR (EFFEXOR-XR) 150 MG 24 hr capsule Take 1 capsule (150 mg total) by mouth daily with breakfast. 90 capsule 0   No current facility-administered medications for this visit.     Musculoskeletal: Strength & Muscle Tone: UTA Gait & Station: normal Patient leans: N/A  Psychiatric Specialty Exam: Review of Systems  Psychiatric/Behavioral: The patient is nervous/anxious.   All other systems reviewed and are negative.   There were no vitals taken for this visit.There is no height or weight on file to calculate BMI.  General Appearance: Casual  Eye Contact:  Fair  Speech:  Clear and Coherent  Volume:  Normal  Mood:  Anxious  Affect:  Congruent  Thought Process:  Goal Directed and Descriptions of Associations: Intact  Orientation:  Full (Time, Place, and Person)  Thought Content: Logical   Suicidal Thoughts:  No  Homicidal Thoughts:  No  Memory:  Immediate;   Fair Recent;   Fair Remote;   Fair  Judgement:  Fair  Insight:  Fair  Psychomotor Activity:  Normal  Concentration:  Concentration: Fair and Attention Span: Fair  Recall:  [employer] of Knowledge: Fair  Language: Fair  Akathisia:  No  Handed:   Right  AIMS (if indicated): UTA  Assets:  Communication Skills Desire for Improvement Housing Social Support  ADL's:  Intact  Cognition: WNL  Sleep:  Fair   Screenings: PHQ2-9     Clinical Support from 08/26/2017 in [facility] Office Visit from 09/11/2016 in [facility] Procedure visit from 08/14/2016 in [facility] Office Visit from 07/09/2016 in [facility] Office Visit from 05/06/2016 in [facility]  PHQ-2 Total Score  0  0  0  0  0       Assessment and Plan: Katrine is a 49 year old Caucasian female on SSI, lives in [place], has a history of bipolar disorder, anxiety disorder, tobacco use disorder, asthma, COPD, chronic pain was evaluated by telemedicine today.  Patient is biologically predisposed given her family history of mental health problems as well as her own substance abuse problems.  Patient with psychosocial stressors of trauma, history of chronic pain, death of her father and relationship struggles.  She continues to struggle with mood lability although progressing.  Plan as noted below.  Plan Bipolar disorder-improving Abilify 20 mg p.o. daily Increase Trileptal to 300 mg p.o. daily and 600 mg p.o. every afternoon. Trazodone 200 mg p.o. nightly for sleep  GAD-improving Venlafaxine extended release 150 mg p.o. daily BuSpar 15 mg p.o. 3 times daily Propranolol 10 mg p.o. 3 times daily as needed Patient to continue psychotherapy sessions with Ms.Yates.  Cannabis use disorder-moderate-unstable She is not ready to quit.  Tobacco use disorder-unstable She is not ready to quit.  High risk medication use-will order the following labs.  CBC with differential, CMP.  Will fax it to [facility].  CMA to call patient and let her know.  Follow-up in clinic in 4  weeks or sooner if needed.  I have spent atleast 20 minutes non face to face with patient today. More than 50 % of the time was spent for preparing to see the patient ( e.g., review of test, records ), obtaining and to review and separately obtained history , ordering medications and test ,psychoeducation and supportive psychotherapy and care coordination,as well as documenting clinical information in electronic health record. This note was generated in part or whole with voice recognition software. Voice recognition is usually quite accurate but there are transcription errors that can and very often do occur. I apologize for any typographical errors that were not detected and corrected.       Jomarie Longs, MD 08/10/2019, 10:32 AM

## 2019-09-01 ENCOUNTER — Encounter (HOSPITAL_COMMUNITY): Payer: Self-pay | Admitting: Psychology

## 2019-09-01 ENCOUNTER — Ambulatory Visit (HOSPITAL_COMMUNITY): Payer: Medicaid Other | Admitting: Psychology

## 2019-09-01 ENCOUNTER — Telehealth: Payer: Self-pay | Admitting: Psychiatry

## 2019-09-01 ENCOUNTER — Other Ambulatory Visit: Payer: Self-pay

## 2019-09-01 LAB — CBC WITH DIFFERENTIAL
Basophils Absolute: 0.1 10*3/uL (ref 0.0–0.2)
Basos: 1 %
EOS (ABSOLUTE): 0.2 10*3/uL (ref 0.0–0.4)
Eos: 2 %
Hematocrit: 46.3 % (ref 34.0–46.6)
Hemoglobin: 15.5 g/dL (ref 11.1–15.9)
Immature Grans (Abs): 0 10*3/uL (ref 0.0–0.1)
Immature Granulocytes: 0 %
Lymphocytes Absolute: 2.8 10*3/uL (ref 0.7–3.1)
Lymphs: 33 %
MCH: 30.5 pg (ref 26.6–33.0)
MCHC: 33.5 g/dL (ref 31.5–35.7)
MCV: 91 fL (ref 79–97)
Monocytes Absolute: 0.7 10*3/uL (ref 0.1–0.9)
Monocytes: 8 %
Neutrophils Absolute: 5 10*3/uL (ref 1.4–7.0)
Neutrophils: 56 %
RBC: 5.09 x10E6/uL (ref 3.77–5.28)
RDW: 13 % (ref 11.7–15.4)
WBC: 8.7 10*3/uL (ref 3.4–10.8)

## 2019-09-01 LAB — CMP AND LIVER
ALT: 14 IU/L (ref 0–32)
AST: 21 IU/L (ref 0–40)
Albumin: 4.3 g/dL (ref 3.8–4.8)
Alkaline Phosphatase: 105 IU/L (ref 39–117)
BUN: 16 mg/dL (ref 6–24)
Bilirubin Total: 0.3 mg/dL (ref 0.0–1.2)
Bilirubin, Direct: 0.11 mg/dL (ref 0.00–0.40)
CO2: 29 mmol/L (ref 20–29)
Calcium: 9.7 mg/dL (ref 8.7–10.2)
Chloride: 99 mmol/L (ref 96–106)
Creatinine, Ser: 1.02 mg/dL — ABNORMAL HIGH (ref 0.57–1.00)
GFR calc Af Amer: 74 mL/min/{1.73_m2} (ref >59)
GFR calc non Af Amer: 64 mL/min/{1.73_m2} (ref >59)
Glucose: 94 mg/dL (ref 65–99)
Potassium: 3.8 mmol/L (ref 3.5–5.2)
Sodium: 139 mmol/L (ref 134–144)
Total Protein: 6.9 g/dL (ref 6.0–8.5)

## 2019-09-01 NOTE — Progress Notes (Signed)
Shelly Wright is a 49 y.o. female patient who didn't show for her virtual phone visit.   Counselor called 2 times, received message that user unavailable and to leave a message, leaving message each time. Pt to call to office to reschedule.         Forde Radon, [facility]

## 2019-09-01 NOTE — Telephone Encounter (Signed)
I have reviewed labs in E HR-CBC with differential within normal limits.  CMP within normal limits except for creatinine elevated-however is chronic.  Downtrending compared to previous one.

## 2019-09-07 ENCOUNTER — Encounter: Payer: Self-pay | Admitting: Psychiatry

## 2019-09-07 ENCOUNTER — Other Ambulatory Visit: Payer: Self-pay

## 2019-09-07 ENCOUNTER — Telehealth (INDEPENDENT_AMBULATORY_CARE_PROVIDER_SITE_OTHER): Payer: Medicaid Other | Admitting: Psychiatry

## 2019-09-07 DIAGNOSIS — F3178 Bipolar disorder, in full remission, most recent episode mixed: Secondary | ICD-10-CM

## 2019-09-07 DIAGNOSIS — F1220 Cannabis dependence, uncomplicated: Secondary | ICD-10-CM | POA: Diagnosis not present

## 2019-09-07 DIAGNOSIS — F411 Generalized anxiety disorder: Secondary | ICD-10-CM | POA: Diagnosis not present

## 2019-09-07 DIAGNOSIS — F410 Panic disorder [episodic paroxysmal anxiety] without agoraphobia: Secondary | ICD-10-CM

## 2019-09-07 DIAGNOSIS — F17200 Nicotine dependence, unspecified, uncomplicated: Secondary | ICD-10-CM

## 2019-09-07 MED ORDER — VENLAFAXINE HCL ER 150 MG PO CP24: 150.0000 mg | ORAL_CAPSULE | Freq: Every day | ORAL | 0 refills | Status: DC

## 2019-09-07 NOTE — Progress Notes (Signed)
Provider Location : [facility] Patient Location : Home  Virtual Visit via Video Note  I connected with Shelly Wright on 09/07/19 at  1:00 PM EDT by a video enabled telemedicine application and verified that I am speaking with the correct person using two identifiers.   I discussed the limitations of evaluation and management by telemedicine and the availability of in person appointments. The patient expressed understanding and agreed to proceed.  I discussed the assessment and treatment plan with the patient. The patient was provided an opportunity to ask questions and all were answered. The patient agreed with the plan and demonstrated an understanding of the instructions.   The patient was advised to call back or seek an in-person evaluation if the symptoms worsen or if the condition fails to improve as anticipated.   BH MD OP Progress Note  09/07/2019 1:16 PM Shelly Wright  MRN:  [id number]  Chief Complaint:  Chief Complaint    Follow-up     HPI: Shelly Wright is a 49 year old Caucasian female, on SSI, lives in [place], has a history of bipolar disorder, GAD, use disorder, panic attacks, tobacco use disorder was evaluated by telemedicine today.  Patient today reports she is currently doing well with her grief.  She is coping better.  She is taking it one day at a time.  She reports her situational stressor of having her sister-in-law live with her has been resolved.  Her sister-in-law has moved out.  That has helped a lot.  She currently reports her mood symptoms as stable on the current medication regimen.  She reports sleep as good on the trazodone.  Patient denies any suicidality, homicidality or perceptual disturbances.  Patient reports she continues to smoke cannabis and also smokes cigarettes and is not ready to quit.  She reports she Diantonio have missed her appointment with her therapist however does not know what happened  and also reports she has not rescheduled her appointment with  her therapist yet.  Patient denies any other concerns today. Visit Diagnosis:    ICD-10-CM   1. Bipolar disorder, in full remission, most recent episode mixed (HCC)  F31.78   2. GAD (generalized anxiety disorder)  F41.1 venlafaxine XR (EFFEXOR-XR) 150 MG 24 hr capsule  3. Panic attacks  F41.0   4. Cannabis use disorder, moderate, dependence (HCC)  F12.20   5. Tobacco use disorder  F17.200     Past Psychiatric History: I have reviewed past psychiatric history from my progress note on 09/16/2018.  Past trials of venlafaxine, trazodone, hydroxyzine.  Past Medical History:  Past Medical History:  Diagnosis Date  . Acute right-sided low back pain with right-sided sciatica 08/30/2014  . Anxiety   . Arthritis   . Asthma    in past, no current inhalers  . Bipolar disorder (HCC)   . Cervical spinal cord compression (HCC) 05/07/2012  . COPD (chronic obstructive pulmonary disease) (HCC)   . DDD (degenerative disc disease), lumbar   . Depression   . Dyspnea    with exertion  . Dysrhythmia    irregular-h/o in the past  . Fibromyalgia   . Hypertension   . Neuritis or radiculitis due to rupture of lumbar intervertebral disc 05/23/2014  . Periprosthetic fracture around prosthetic knee 07/31/2015  . Restless leg syndrome   . Soft tissue lesion of shoulder region 01/24/2012  . 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 Syncope and collapse 04/08/2014  .  Thoracic and lumbosacral neuritis 05/07/2012  . Thoracic neuritis 05/07/2012    Past Surgical History:  Procedure Laterality Date  . ABDOMINAL HYSTERECTOMY    . Cervical Fusion X 2    . JOINT REPLACEMENT Right 01/2016   Dr Jefm Bryant  . KNEE ARTHROPLASTY Left 02/10/2019   Procedure: COMPUTER ASSISTED TOTAL KNEE ARTHROPLASTY - RNFA;  Surgeon: Dereck Leep, MD;  Location: [facility];  Service: Orthopedics;  Laterality: Left;  . KNEE ARTHROSCOPY Bilateral    Right knee  scope 1998, Left knee scope  . TONSILLECTOMY    . TOTAL KNEE ARTHROPLASTY Right 02/01/2015   Procedure: TOTAL KNEE ARTHROPLASTY;  Surgeon: Leanor Kail, MD;  Location: [facility];  Service: Orthopedics;  Laterality: Right;  . TUBAL LIGATION      Family Psychiatric History: I have reviewed family psychiatric history from my progress note on 09/16/2018.  Family History:  Family History  Problem Relation Age of Onset  . Breast cancer Mother   . Colon cancer Father   . Heart attack Father   . Bipolar disorder Sister   . Depression Sister   . Schizophrenia Sister     Social History: I have reviewed social history from my progress note on 09/16/2018. Social History   Socioeconomic History  . Marital status: Legally Separated    Spouse name: Not on file  . Number of children: 1  . Years of education: Not on file  . Highest education level: High school graduate  Occupational History  . Not on file  Tobacco Use  . Smoking status: Current Every Day Smoker    Packs/day: 1.00    Years: 30.00    Pack years: 30.00    Types: Cigarettes  . Smokeless tobacco: Never Used  Substance and Sexual Activity  . Alcohol use: No  . Drug use: Yes    Types: Marijuana    Comment: every day  . Sexual activity: Yes  Other Topics Concern  . Not on file  Social History Narrative  . Not on file   Social Determinants of Health   Financial Resource Strain: Low Risk   . Difficulty of Paying Living Expenses: Not hard at all  Food Insecurity: No Food Insecurity  . Worried About Charity fundraiser in the Last Year: Never true  . Ran Out of Food in the Last Year: Never true  Transportation Needs: No Transportation Needs  . Lack of Transportation (Medical): No  . Lack of Transportation (Non-Medical): No  Physical Activity: Inactive  . Days of Exercise per Week: 0 days  . Minutes of Exercise per Session: 0 min  Stress: Stress Concern Present  . Feeling of Stress : To some extent  Social  Connections: Unknown  . Frequency of Communication with Friends and Family: Not on file  . Frequency of Social Gatherings with Friends and Family: Not on file  . Attends Religious Services: Never  . Active Member of Clubs or Organizations: No  . Attends Archivist Meetings: Never  . Marital Status: Separated    Allergies: No Known Allergies  Metabolic Disorder Labs: No results found for: HGBA1C, MPG No results found for: PROLACTIN No results found for: CHOL, TRIG, HDL, CHOLHDL, VLDL, LDLCALC No results found for: TSH  Therapeutic Level Labs: No results found for: LITHIUM No results found for: VALPROATE No components found for:  CBMZ  Current Medications: Current Outpatient Medications  Medication Sig Dispense Refill  . albuterol (VENTOLIN HFA) 108 (90 Base) MCG/ACT inhaler Inhale 2 puffs  into the lungs 4 (four) times daily.     . ARIPiprazole (ABILIFY) 20 MG tablet Take 1 tablet (20 mg total) by mouth daily. 90 tablet 0  . busPIRone (BUSPAR) 15 MG tablet Take 1 tablet (15 mg total) by mouth 3 (three) times daily. 270 tablet 0  . celecoxib (CELEBREX) 200 MG capsule Take 1 capsule (200 mg total) by mouth 2 (two) times daily. 60 capsule 1  . enoxaparin (LOVENOX) 40 MG/0.4ML injection Inject 0.4 mLs (40 mg total) into the skin daily for 14 doses. 5.6 mL 0  . gabapentin (NEURONTIN) 800 MG tablet Take 1 tablet (800 mg total) by mouth every 6 (six) hours. 120 tablet 5  . hydrochlorothiazide (HYDRODIURIL) 25 MG tablet Take 25 mg by mouth every morning.   1  . lidocaine (XYLOCAINE) 5 % ointment Apply 1 application topically 4 (four) times daily as needed for moderate pain. (1application= 5 g, or 6 inches of ointment) Max: 20 g/day 50 g PRN  . methocarbamol (ROBAXIN) 750 MG tablet Take 1 tablet (750 mg total) by mouth 3 (three) times daily. Max: 3/day 90 tablet 5  . Oxcarbazepine (TRILEPTAL) 300 MG tablet Take 3 tablets (900 mg total) by mouth daily. Take 300 mg daily in the AM and  600 mg PM. 270 tablet 0  . propranolol (INDERAL) 10 MG tablet TAKE 1 TABLET BY MOUTH THREE TIMES DAILYAS NEEDED FOR SEVERE PANIC ATTACKS ONLY 90 tablet 1  . simvastatin (ZOCOR) 20 MG tablet Take 20 mg by mouth at bedtime.     Marland Kitchen tiotropium (SPIRIVA HANDIHALER) 18 MCG inhalation capsule Place 18 mcg into inhaler and inhale every morning.     . traZODone (DESYREL) 100 MG tablet Take 2 tablets (200 mg total) by mouth at bedtime as needed for sleep. 180 tablet 0  . venlafaxine XR (EFFEXOR-XR) 150 MG 24 hr capsule Take 1 capsule (150 mg total) by mouth daily with breakfast. 90 capsule 0   No current facility-administered medications for this visit.     Musculoskeletal: Strength & Muscle Tone: UTA Gait & Station: normal Patient leans: N/A  Psychiatric Specialty Exam: Review of Systems  Psychiatric/Behavioral: Negative for agitation, behavioral problems, confusion, decreased concentration, dysphoric mood, hallucinations, self-injury, sleep disturbance and suicidal ideas. The patient is not nervous/anxious and is not hyperactive.   All other systems reviewed and are negative.   There were no vitals taken for this visit.There is no height or weight on file to calculate BMI.  General Appearance: Casual  Eye Contact:  Fair  Speech:  Clear and Coherent  Volume:  Normal  Mood:  Euthymic  Affect:  Congruent  Thought Process:  Goal Directed and Descriptions of Associations: Intact  Orientation:  Full (Time, Place, and Person)  Thought Content: Logical   Suicidal Thoughts:  No  Homicidal Thoughts:  No  Memory:  Immediate;   Fair Recent;   Fair Remote;   Fair  Judgement:  Fair  Insight:  Fair  Psychomotor Activity:  Normal  Concentration:  Concentration: Fair and Attention Span: Fair  Recall:  [employer] of Knowledge: Fair  Language: Fair  Akathisia:  No  Handed:  Right  AIMS (if indicated): UTA  Assets:  Communication Skills Desire for Improvement Housing Social Support  ADL's:   Intact  Cognition: WNL  Sleep:  Fair   Screenings: PHQ2-9     Clinical Support from 08/26/2017 in [facility] Office Visit from 09/11/2016 in [facility] Procedure visit from 08/14/2016 in [facility] Office Visit from 07/09/2016 in [facility] Office Visit from 05/06/2016 in [facility]  PHQ-2 Total Score  0  0  0  0  0       Assessment and Plan: Undrea is a 50 year old Caucasian female on SSI, lives in [place], has a history of bipolar disorder, anxiety disorder, tobacco use disorder, asthma, COPD, chronic pain was evaluated by telemedicine today.  Patient is biologically predisposed given her family history of mental health problems as well as having substance abuse problems.  Patient with psychosocial stressors of trauma, history of chronic pain, death of her father and relationship struggles.  Patient is currently stable on current medication regimen however has been noncompliant with psychotherapy sessions.  Plan as noted below.  Plan Bipolar disorder in remission Abilify 20 mg p.o. daily Trileptal 300 mg p.o. daily and 600 mg every afternoon. Trazodone 200 mg p.o. nightly for sleep  GAD-improving Venlafaxine extended release 150 mg p.o. daily BuSpar 15 mg p.o. 3 times daily. Propranolol 10 mg p.o. 3 times daily as needed Patient has been noncompliant with psychotherapy sessions with Ms. Yates-encouraged her to make an appointment again.  Cannabis use disorder-moderate-unstable Provided counseling.  She is not ready to quit.  Tobacco use disorder-unstable She is not ready to quit.  Patient with elevated creatinine level based on most recent CMP labs dated 08/31/19.  This was discussed with patient.  She will reach out to her primary care provider.  Follow-up in  clinic in 2 months or sooner if needed.  I have spent atleast 20 minutes non face to face with patient today. More than 50 % of the time was spent for preparing to see the patient ( e.g., review of test, records ),  ordering medications and test ,psychoeducation and supportive psychotherapy and care coordination,as well as documenting clinical information in electronic health record. This note was generated in part or whole with voice recognition software. Voice recognition is usually quite accurate but there are transcription errors that can and very often do occur. I apologize for any typographical errors that were not detected and corrected.       Jomarie Longs, MD 09/07/2019, 1:16 PM

## 2019-09-07 NOTE — Patient Instructions (Signed)
Follow-up in clinic on June 28 at 11:20 AM

## 2019-09-09 ENCOUNTER — Other Ambulatory Visit: Payer: Self-pay | Admitting: Psychiatry

## 2019-09-09 DIAGNOSIS — F410 Panic disorder [episodic paroxysmal anxiety] without agoraphobia: Secondary | ICD-10-CM

## 2019-09-22 ENCOUNTER — Other Ambulatory Visit: Payer: Self-pay | Admitting: Psychiatry

## 2019-09-22 DIAGNOSIS — F3130 Bipolar disorder, current episode depressed, mild or moderate severity, unspecified: Secondary | ICD-10-CM

## 2019-09-22 DIAGNOSIS — F411 Generalized anxiety disorder: Secondary | ICD-10-CM

## 2019-09-27 ENCOUNTER — Encounter (HOSPITAL_COMMUNITY): Payer: Self-pay | Admitting: Psychology

## 2019-11-08 ENCOUNTER — Other Ambulatory Visit: Payer: Self-pay | Admitting: Psychiatry

## 2019-11-08 DIAGNOSIS — F411 Generalized anxiety disorder: Secondary | ICD-10-CM

## 2019-11-14 IMAGING — DX DG KNEE 1-2V PORT*L*
2 series · 2 of 2 positions shown · non-contrast
Comparison: Radiographs dated 11/25/2006

CLINICAL DATA: Status post left total knee replacement.

EXAM:
PORTABLE LEFT KNEE - 1-2 VIEW

[knee ap]
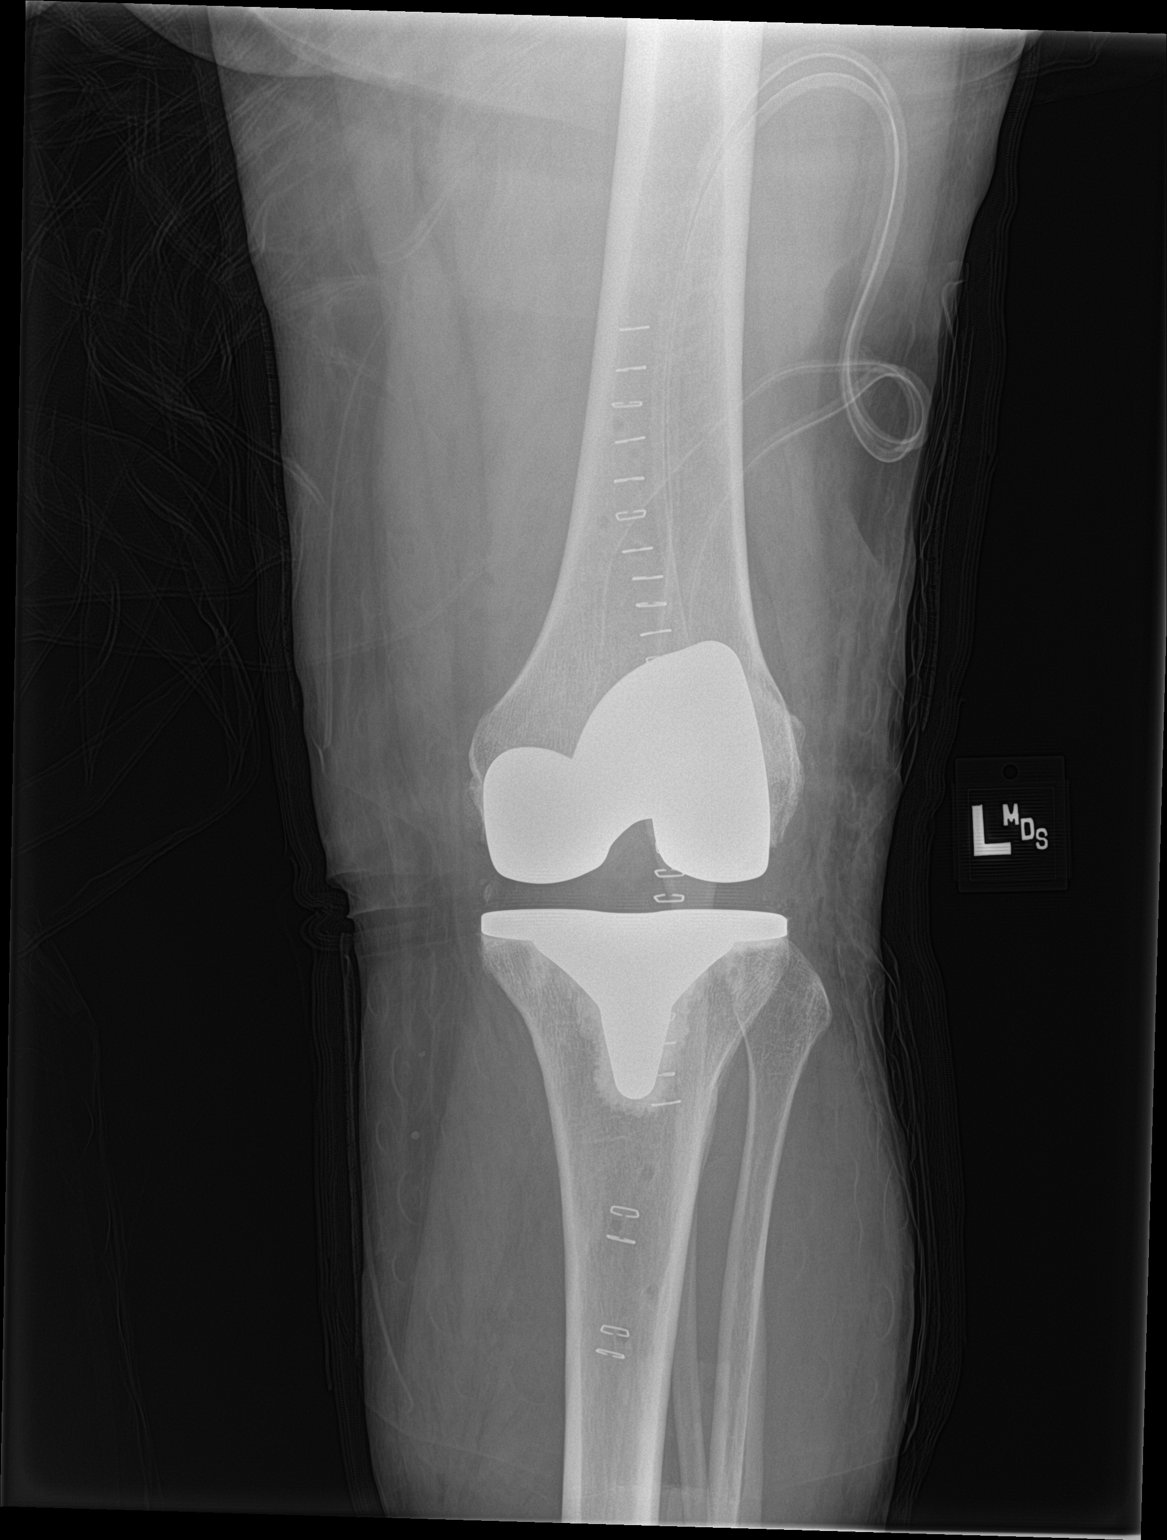

[knee lat]
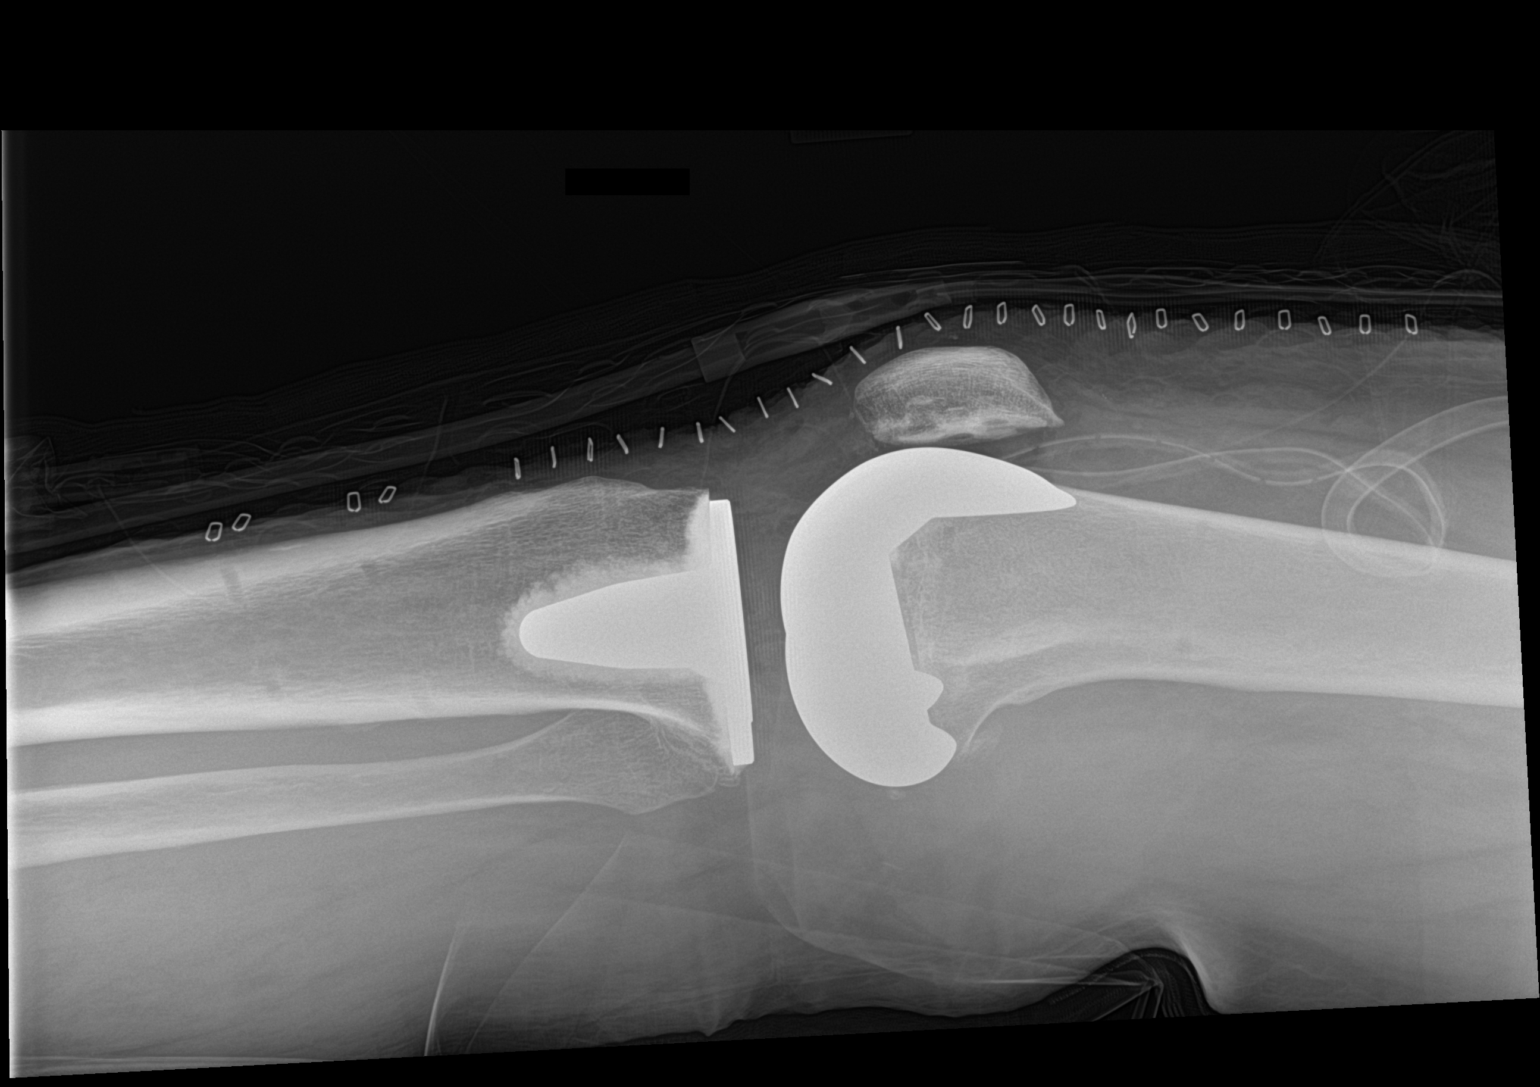

[2 of 2 positions shown; findings below may reference images not displayed]

FINDINGS: The components of the total knee prosthesis appear in excellent
position. Surgical drain in place. No fractures.
IMPRESSION: Satisfactory appearance of the left knee after total knee
replacement.

## 2019-11-15 ENCOUNTER — Other Ambulatory Visit: Payer: Self-pay

## 2019-11-15 ENCOUNTER — Telehealth (INDEPENDENT_AMBULATORY_CARE_PROVIDER_SITE_OTHER): Payer: Medicaid Other | Admitting: Psychiatry

## 2019-11-15 ENCOUNTER — Encounter: Payer: Self-pay | Admitting: Psychiatry

## 2019-11-15 DIAGNOSIS — F3178 Bipolar disorder, in full remission, most recent episode mixed: Secondary | ICD-10-CM

## 2019-11-15 DIAGNOSIS — F411 Generalized anxiety disorder: Secondary | ICD-10-CM

## 2019-11-15 DIAGNOSIS — F410 Panic disorder [episodic paroxysmal anxiety] without agoraphobia: Secondary | ICD-10-CM

## 2019-11-15 DIAGNOSIS — F17200 Nicotine dependence, unspecified, uncomplicated: Secondary | ICD-10-CM

## 2019-11-15 DIAGNOSIS — F1220 Cannabis dependence, uncomplicated: Secondary | ICD-10-CM | POA: Diagnosis not present

## 2019-11-15 MED ORDER — OXCARBAZEPINE 600 MG PO TABS: 600.0000 mg | ORAL_TABLET | Freq: Two times a day (BID) | ORAL | 0 refills | Status: DC

## 2019-11-15 MED ORDER — BUSPIRONE HCL 15 MG PO TABS: 15.0000 mg | ORAL_TABLET | Freq: Three times a day (TID) | ORAL | 0 refills | Status: DC

## 2019-11-15 MED ORDER — VENLAFAXINE HCL ER 150 MG PO CP24: 150.0000 mg | ORAL_CAPSULE | Freq: Every day | ORAL | 0 refills | Status: DC

## 2019-11-15 MED ORDER — ARIPIPRAZOLE 20 MG PO TABS: 20.0000 mg | ORAL_TABLET | Freq: Every day | ORAL | 0 refills | Status: DC

## 2019-11-15 NOTE — Progress Notes (Signed)
Provider Location : [facility] Patient Location : [name]  Virtual Visit via Video Note  I connected with Shelly Wright on 11/15/19 at 11:20 AM EDT by a video enabled telemedicine application and verified that I am speaking with the correct person using two identifiers.   I discussed the limitations of evaluation and management by telemedicine and the availability of in person appointments. The patient expressed understanding and agreed to proceed.   I discussed the assessment and treatment plan with the patient. The patient was provided an opportunity to ask questions and all were answered. The patient agreed with the plan and demonstrated an understanding of the instructions.   The patient was advised to call back or seek an in-person evaluation if the symptoms worsen or if the condition fails to improve as anticipated.   [facility] MD OP Progress Note  11/15/2019 12:21 PM Analysse Quinonez Volker  MRN:  [id number]  Chief Complaint:  Chief Complaint    Follow-up     HPI: Shelly Wright is a 49 year old Caucasian female, on SSI, lives in [place], has a history of bipolar disorder, GAD, cannabis use disorder, panic attacks, tobacco use disorder was evaluated by telemedicine today.  Patient today reports she continues to struggle with irritability on and off.  She reports she is compliant on medications.  She however continues to smoke cigarettes as well as use cannabis regularly.  She reports she needs the cannabis to manage her pain and does not worry about it affecting her mood or sleep.  She reports she is not willing to cut back.  She also has been noncompliant with psychotherapy sessions.  She reports after her therapist left she has not heard back from anyone at her office.  Writer offered to patient that she can be scheduled with therapist here at our practice.  Patient however declined.  Patient reports sleep is good on the trazodone.  Patient denies any suicidality, homicidality or perceptual  disturbances.  Patient denies any other concerns today.  Visit Diagnosis:    ICD-10-CM   1. Bipolar disorder, in full remission, most recent episode mixed (HCC)  F31.78 oxcarbazepine (TRILEPTAL) 600 MG tablet  2. GAD (generalized anxiety disorder)  F41.1 ARIPiprazole (ABILIFY) 20 MG tablet    venlafaxine XR (EFFEXOR-XR) 150 MG 24 hr capsule  3. Panic attacks  F41.0 busPIRone (BUSPAR) 15 MG tablet  4. Cannabis use disorder, moderate, dependence (HCC)  F12.20   5. Tobacco use disorder  F17.200     Past Psychiatric History: I have reviewed past psychiatric history per my progress note on 09/16/2018.  Past trials of venlafaxine, trazodone, hydroxyzine.  Past Medical History:  Past Medical History:  Diagnosis Date  . Acute right-sided low back pain with right-sided sciatica 08/30/2014  . Anxiety   . Arthritis   . Asthma    in past, no current inhalers  . Bipolar disorder (HCC)   . Cervical spinal cord compression (HCC) 05/07/2012  . COPD (chronic obstructive pulmonary disease) (HCC)   . DDD (degenerative disc disease), lumbar   . Depression   . Dyspnea    with exertion  . Dysrhythmia    irregular-h/o in the past  . Fibromyalgia   . Hypertension   . Neuritis or radiculitis due to rupture of lumbar intervertebral disc 05/23/2014  . Periprosthetic fracture around prosthetic knee 07/31/2015  . Restless leg syndrome   . Soft tissue lesion of shoulder region 01/24/2012  . 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 Syncope and collapse 04/08/2014  . Thoracic and lumbosacral neuritis 05/07/2012  . Thoracic neuritis 05/07/2012    Past Surgical History:  Procedure Laterality Date  . ABDOMINAL HYSTERECTOMY    . Cervical Fusion X 2    . JOINT REPLACEMENT Right 01/2016   Dr Gavin Potters  . KNEE ARTHROPLASTY Left 02/10/2019   Procedure: COMPUTER ASSISTED TOTAL KNEE ARTHROPLASTY - RNFA;  Surgeon: Donato Heinz, MD;   Location: [facility];  Service: Orthopedics;  Laterality: Left;  . KNEE ARTHROSCOPY Bilateral    Right knee scope 1998, Left knee scope  . TONSILLECTOMY    . TOTAL KNEE ARTHROPLASTY Right 02/01/2015   Procedure: TOTAL KNEE ARTHROPLASTY;  Surgeon: Erin Sons, MD;  Location: [facility] OR[facility];  Service: Orthopedics;  Laterality: Right;  . TUBAL LIGATION      Family Psychiatric History: I have reviewed family psychiatric history from my progress note on 09/16/2018.  Family History:  Family History  Problem Relation Age of Onset  . Breast cancer Mother   . Colon cancer Father   . Heart attack Father   . Bipolar disorder Sister   . Depression Sister   . Schizophrenia Sister     Social History: Reviewed social history from my progress note on 09/16/2018. Social History   Socioeconomic History  . Marital status: Legally Separated    Spouse name: Not on file  . Number of children: 1  . Years of education: Not on file  . Highest education level: High school graduate  Occupational History  . Not on file  Tobacco Use  . Smoking status: Current Every Day Smoker    Packs/day: 1.00    Years: 30.00    Pack years: 30.00    Types: Cigarettes  . Smokeless tobacco: Never Used  Vaping Use  . Vaping Use: Never used  Substance and Sexual Activity  . Alcohol use: No  . Drug use: Yes    Types: Marijuana    Comment: every day  . Sexual activity: Yes  Other Topics Concern  . Not on file  Social History Narrative  . Not on file   Social Determinants of Health   Financial Resource Strain:   . Difficulty of Paying Living Expenses:   Food Insecurity:   . Worried About Programme researcher, broadcasting/film/video in the Last Year:   . Barista in the Last Year:   Transportation Needs:   . Freight forwarder (Medical):   Marland Kitchen Lack of Transportation (Non-Medical):   Physical Activity:   . Days of Exercise per Week:   . Minutes of Exercise per Session:   Stress:   . Feeling of Stress :   Social Connections:    . Frequency of Communication with Friends and Family:   . Frequency of Social Gatherings with Friends and Family:   . Attends Religious Services:   . Active Member of Clubs or Organizations:   . Attends Banker Meetings:   Marland Kitchen Marital Status:     Allergies: No Known Allergies  Metabolic Disorder Labs: No results found for: HGBA1C, MPG No results found for: PROLACTIN No results found for: CHOL, TRIG, HDL, CHOLHDL, VLDL, LDLCALC No results found for: TSH  Therapeutic Level Labs: No results found for: LITHIUM No results found for: VALPROATE No components found for:  CBMZ  Current Medications: Current Outpatient Medications  Medication Sig Dispense Refill  . albuterol (VENTOLIN HFA) 108 (90 Base) MCG/ACT inhaler Inhale 2 puffs  into the lungs 4 (four) times daily.     . ARIPiprazole (ABILIFY) 20 MG tablet Take 1 tablet (20 mg total) by mouth daily. 90 tablet 0  . busPIRone (BUSPAR) 15 MG tablet Take 1 tablet (15 mg total) by mouth 3 (three) times daily. 270 tablet 0  . celecoxib (CELEBREX) 200 MG capsule Take 1 capsule (200 mg total) by mouth 2 (two) times daily. 60 capsule 1  . enoxaparin (LOVENOX) 40 MG/0.4ML injection Inject 0.4 mLs (40 mg total) into the skin daily for 14 doses. 5.6 mL 0  . gabapentin (NEURONTIN) 800 MG tablet Take 1 tablet (800 mg total) by mouth every 6 (six) hours. 120 tablet 5  . hydrochlorothiazide (HYDRODIURIL) 25 MG tablet Take 25 mg by mouth every morning.   1  . lidocaine (XYLOCAINE) 5 % ointment Apply 1 application topically 4 (four) times daily as needed for moderate pain. (1application= 5 g, or 6 inches of ointment) Max: 20 g/day 50 g PRN  . methocarbamol (ROBAXIN) 750 MG tablet Take 1 tablet (750 mg total) by mouth 3 (three) times daily. Max: 3/day 90 tablet 5  . oxcarbazepine (TRILEPTAL) 600 MG tablet Take 1 tablet (600 mg total) by mouth 2 (two) times daily. For mood 180 tablet 0  . propranolol (INDERAL) 10 MG tablet TAKE 1 TABLET BY MOUTH  THREE TIMES DAILYAS NEEDED FOR SEVERE PANIC ATTACKS ONLY 90 tablet 1  . simvastatin (ZOCOR) 20 MG tablet Take 20 mg by mouth at bedtime.     Marland Kitchen tiotropium (SPIRIVA HANDIHALER) 18 MCG inhalation capsule Place 18 mcg into inhaler and inhale every morning.     . traZODone (DESYREL) 100 MG tablet TAKE TWO TABLETS AT BEDTIME AS NEEDED FOR SLEEP 180 tablet 0  . venlafaxine XR (EFFEXOR-XR) 150 MG 24 hr capsule Take 1 capsule (150 mg total) by mouth daily with breakfast. 90 capsule 0   No current facility-administered medications for this visit.     Musculoskeletal: Strength & Muscle Tone: UTA Gait & Station: normal Patient leans: N/A  Psychiatric Specialty Exam: Review of Systems  Psychiatric/Behavioral:       Irritable  All other systems reviewed and are negative.   There were no vitals taken for this visit.There is no height or weight on file to calculate BMI.  General Appearance: Casual  Eye Contact:  Fair  Speech:  Clear and Coherent  Volume:  Normal  Mood:  Irritable  Affect:  Appropriate  Thought Process:  Goal Directed and Descriptions of Associations: Intact  Orientation:  Full (Time, Place, and Person)  Thought Content: Logical   Suicidal Thoughts:  No  Homicidal Thoughts:  No  Memory:  Immediate;   Fair Recent;   Fair Remote;   Fair  Judgement:  Fair  Insight:  Fair  Psychomotor Activity:  Normal  Concentration:  Concentration: Fair and Attention Span: Fair  Recall:  [employer] of Knowledge: Fair  Language: Fair  Akathisia:  No  Handed:  Right  AIMS (if indicated): UTA  Assets:  Communication Skills Desire for Improvement Housing Social Support  ADL's:  Intact  Cognition: WNL  Sleep:  Fair   Screenings: PHQ2-9     Clinical Support from 08/26/2017 in [facility] Office Visit from 09[facility]11/2016 in [facility] Procedure visit from 08/14/2016 in [facility] Office Visit from 07/09/2016 in [facility] Office Visit from 05/06/2016 in  [facility]  PHQ-2 Total Score 0 0 0 0 0       Assessment and Plan: Xena Propst Huard is a 49 year old Caucasian female on SSI, lives in [place], has a history of bipolar disorder, anxiety disorder, tobacco use disorder, asthma, COPD, chronic pain was evaluated by telemedicine today.  Patient is biologically predisposed given her family history of mental health problems as well as substance abuse problems.  Patient with psychosocial stressors of trauma, history of chronic pain, death of her father, relationship struggles.  Patient continues to struggle with irritability however continues to have substance abuse problems and is not willing to cut back.  She also is not willing to comply with psychotherapy recommendations.  Plan as noted below.  Plan Bipolar disorder in remission Abilify 20 mg p.o. daily Trileptal as prescribed Trazodone 200 mg p.o. nightly for sleep  GAD-improving Venlafaxine extended release 150 mg p.o. daily BuSpar 15 mg p.o. 3 times daily Propranolol 10 mg p.o. 3 times daily as needed Increase Trileptal to 600 mg p.o. twice daily.  Cannabis use disorder-moderate-unstable It is likely that cannabis use is also affecting her mood and sleep.  Provided counseling.  Patient is not ready to quit.  Tobacco use disorder-unstable She is not ready to quit. Provided counseling.  Patient advised to restart psychotherapy sessions with therapist at our practice, she declines.  Follow-up in clinic in 2 months or sooner if needed.  I have spent atleast 20 minutes non face to face with patient today. More than 50 % of the time was spent for preparing to see the patient ( e.g., review of test, records ), ordering medications and test ,psychoeducation and supportive psychotherapy and care coordination,as well  as documenting clinical information in electronic health record. This note was generated in part or whole with voice recognition software. Voice recognition is usually quite accurate but there are transcription errors that can and very often do occur. I apologize for any typographical errors that were not detected and corrected.        Ursula Alert, MD 11/15/2019, 12:21 PM

## 2019-11-25 ENCOUNTER — Other Ambulatory Visit: Payer: Self-pay | Admitting: Pain Medicine

## 2019-11-25 DIAGNOSIS — M797 Fibromyalgia: Secondary | ICD-10-CM

## 2019-11-25 DIAGNOSIS — M792 Neuralgia and neuritis, unspecified: Secondary | ICD-10-CM

## 2019-11-29 NOTE — Progress Notes (Signed)
PROVIDER NOTE: Information contained herein reflects review and annotations entered in association with encounter. Interpretation of such information and data should be left to medically-trained personnel. Information provided to patient can be located elsewhere in the medical record under "Patient Instructions". Document created using STT-dictation technology, any transcriptional errors that Celia result from process are unintentional.    Patient: Shelly Wright  Service Category: E/M  Provider: Gaspar Cola, MD  DOB: Stucky 10, 1972  DOS: 11/30/2019  Specialty: Interventional Pain Management  MRN: [id number]  Setting: Ambulatory outpatient  PCP: [facility]  Type: Established Patient    Referring Provider: Emily Filbert*  Location: Office  Delivery: Face-to-face     HPI  Reason for encounter: Shelly Wright, a 49 y.o. year old female, is here today for evaluation and management of her Chronic pain syndrome [G89.4]. [name] primary complain today is Back Pain (lower), Shoulder Pain (bilateral), Hip Pain (bilateral), and Knee Pain (bilateral) Last encounter: Practice (11/25/2019). My last encounter with her was on 11/25/2019. Pertinent problems: [name] has Chronic pain syndrome; DDD (degenerative disc disease), lumbar; Fibromyalgia; Chronic low back pain (Secondary area of Pain) (Bilateral) (L>R); Lumbar canal stenosis (L3-4 and L4-5); Cervical post-laminectomy syndrome (Anterior-posterior approach) (2014); Osteoarthritis of knee (Left); Thoracic or lumbosacral neuritis or radiculitis; Radiculopathy of cervical region (B)(R>L)(Thumb + Index fingers); Chronic shoulder pain (Third area of Pain) (Bilateral) (L>R); S/P TKR Arthroplasty (Right); Chronic hip pain (Primary Area of Pain) (Bilateral) (L>R); Chronic neck pain (Bilateral) (L>R); Chronic knee pain (Bilateral) (L>R); 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 Pain Assessment: Severity of Chronic pain is reported as a 5 /10. Location: Back Lower/ . Onset: More than a month ago. Quality: Aching, Sharp. Timing: Constant. Modifying factor(s): rest. Vitals:  height is _0  (1.702 m) and weight is 225 lb (102.1 kg). Her temporal temperature is 98.6 F (37 C). Her blood pressure is 117/71 and her pulse is 76. Her respiration is 16 and oxygen saturation is 96%.   The patient indicates doing well with the current medication regimen. No adverse reactions or side effects reported to the medications.  At this point the patient is stable on her medication regimen which consist of gabapentin 800 mg p.o. every 6 hours; methocarbamol 750 mg every 8 hours; and lidocaine 5% ointment, 1 application every 6 hours as needed.  The patient is on no opioid analgesics.  At this point we see no reason why she cannot continue to get these medications and follow-up from her primary care.  We will remain available for any interventional therapies that she Gopal be interested in.  Today I have provided the patient with enough medication refills to last for the next 6 months after which we have instructed the patient to request her refills from her PCP.  Pharmacotherapy Assessment   Analgesic: No opioid analgesics prescribed by our practice.  Last prescription written by Valetta Mole, MD for hydrocodone/APAP 5/325 on 02/25/2019. 05/08/2016 UDS positive for unreported THC.  MME: 40 mg/day    Monitoring: [place] PMP: PDMP reviewed during this encounter.       Pharmacotherapy: No side-effects or adverse reactions reported. Compliance: No problems identified. Effectiveness: Clinically acceptable.  Landis Martins, RN  11/30/2019 12:40 PM  Sign when Signing Visit Safety precautions to be maintained  throughout the outpatient stay will include: orient to surroundings, keep bed in low position, maintain call bell within reach at all times, provide assistance with transfer out of bed and ambulation.     UDS:  Summary  Date Value Ref Range Status  05/08/2016 FINAL  Final    Comment:    ==================================================================== [facility] COMP DRUG ANALYSIS,UR ==================================================================== Test                             Result       Flag       Units Drug Present and Declared for Prescription Verification   Gabapentin                     PRESENT      EXPECTED   Trazodone                      PRESENT      EXPECTED   1,3 chlorophenyl piperazine    PRESENT      EXPECTED    1,3-chlorophenyl piperazine is an expected metabolite of    trazodone.   Venlafaxine                    PRESENT      EXPECTED   Desmethylvenlafaxine           PRESENT      EXPECTED    Desmethylvenlafaxine is an expected metabolite of venlafaxine.   Aripiprazole                   PRESENT      EXPECTED   Diclofenac                     PRESENT      EXPECTED   Hydroxyzine                    PRESENT      EXPECTED Drug Present not Declared for Prescription Verification   Carboxy-THC                    277          UNEXPECTED ng/mg creat    Carboxy-THC is a metabolite of tetrahydrocannabinol  (THC).    Source of [facility] is most commonly illicit, but THC is also present    in a scheduled prescription medication. Drug Absent but Declared for Prescription Verification   Oxycodone                      Not Detected UNEXPECTED ng/mg creat   Tizanidine                     Not Detected UNEXPECTED    Tizanidine, as indicated in the declared medication list, is not    always detected even when used as directed. ==================================================================== Test                      Result    Flag   Units      Ref Range   Creatinine              84                mg/dL      >=20 ==================================================================== Declared Medications:  The flagging and interpretation on this report are based  on the  following declared medications.  Unexpected results Vences arise from  inaccuracies in the declared medications.  **Note: The testing scope of this panel includes these medications:  Aripiprazole (Abilify)  Gabapentin  Hydroxyzine  Oxycodone (Roxicodone)  Trazodone (Desyrel)  Venlafaxine (Effexor)  **Note: The testing scope of this panel does not include small to  moderate amounts of these reported medications:  Diclofenac (Voltaren)  Tizanidine (Zanaflex)  **Note: The testing scope of this panel does not include following  reported medications:  Cholecalciferol  Enoxaparin (Lovenox)  Hydrochlorothiazide  Hydrochlorothiazide (Microzide)  Simvastatin (Zocor) ==================================================================== For clinical consultation, please call [phone]. ====================================================================      ROS  Constitutional: Denies any fever or chills Gastrointestinal: No reported hemesis, hematochezia, vomiting, or acute GI distress Musculoskeletal: Denies any acute onset joint swelling, redness, loss of ROM, or weakness Neurological: No reported episodes of acute onset apraxia, aphasia, dysarthria, agnosia, amnesia, paralysis, loss of coordination, or loss of consciousness  Medication Review  ARIPiprazole, albuterol, busPIRone, celecoxib, enoxaparin, gabapentin, hydrochlorothiazide, lidocaine, methocarbamol, oxcarbazepine, propranolol, simvastatin, tiotropium, traZODone, and venlafaxine XR  History Review  Allergy: Ms. Roam has No Known Allergies. Drug: Ms. Zavaleta  reports current drug use. Drug: Marijuana. Alcohol:  reports no history of alcohol use. Tobacco:  reports that she has been smoking cigarettes. She has a 30.00 pack-year smoking history.  She has never used smokeless tobacco. Social: Ms. Buckalew  reports that she has been smoking cigarettes. She has a 30.00 pack-year smoking history. She has never used smokeless tobacco. She reports current drug use. Drug: Marijuana. She reports that she does not drink alcohol. Medical:  has a past medical history of Acute right-sided low back pain with right-sided sciatica (08/30/2014), Anxiety, Arthritis, Asthma, Bipolar disorder ([place]), Cervical spinal cord compression ([place]) (05/07/2012), COPD (chronic obstructive pulmonary disease) ([place]), DDD (degenerative disc disease), lumbar, Depression, Dyspnea, Dysrhythmia, Fibromyalgia, Hypertension, Neuritis or radiculitis due to rupture of lumbar intervertebral disc (05/23/2014), Periprosthetic fracture around prosthetic knee (07/31/2015), Restless leg syndrome, Soft tissue lesion of shoulder region (01/24/2012), 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 Surgical: Ms. Schoff  has a past surgical history that includes Tonsillectomy; Knee arthroscopy (Bilateral); Cervical Fusion X 2; Abdominal hysterectomy; Tubal ligation; Total knee arthroplasty (Right, 02/01/2015); Joint replacement (Right, 01/2016); and Knee Arthroplasty (Left, 02/10/2019). Family: family history includes Bipolar disorder in her sister; Breast cancer in her mother; Colon cancer in her father; Depression in her sister; Heart attack in her father; Schizophrenia in her sister.  Laboratory Chemistry Profile   Renal Lab Results  Component Value Date   BUN 16 08/31/2019   CREATININE 1.02 (H) 08/31/2019   GFRAA 74 08/31/2019   GFRNONAA 64 08/31/2019     Hepatic Lab Results  Component Value Date   AST 21 08/31/2019   ALT 14 08/31/2019   ALBUMIN 4.3 08/31/2019   ALKPHOS 105 08/31/2019     Electrolytes Lab Results   Component Value Date   NA 139 08/31/2019   K 3.8 08/31/2019   CL 99 08/31/2019   CALCIUM 9.7 08/31/2019   MG 2.1 05/06/2016     Bone Lab Results  Component Value Date   25OHVITD1 56 05/06/2016   25OHVITD2 9.2 05/06/2016   25OHVITD3 47 05/06/2016     Inflammation (CRP: Acute Phase) (ESR: Chronic Phase) Lab Results  Component Value Date   CRP <0.8 02/02/2019   ESRSEDRATE 1  02/02/2019       Note: Above Lab results reviewed.  Recent Imaging Review  NM Myocar Multi W/Spect W/Wall Motion / EF  The study is normal.  This is a low risk study.  The left ventricular ejection fraction is normal (55-65%).  Blood pressure demonstrated a normal response to exercise.  There was no ST segment deviation noted during stress.   Note: Reviewed        Physical Exam  General appearance: Well nourished, well developed, and well hydrated. In no apparent acute distress Mental status: Alert, oriented x 3 (person, place, & time)       Respiratory: No evidence of acute respiratory distress Eyes: PERLA Vitals: BP 117/71   Pulse 76   Temp 98.6 F (37 C) (Temporal)   Resp 16   Ht _0  (1.702 m)   Wt 225 lb (102.1 kg)   SpO2 96%   BMI 35.24 kg/m  BMI: Estimated body mass index is 35.24 kg/m as calculated from the following:   Height as of this encounter: _1  (1.702 m).   Weight as of this encounter: 225 lb (102.1 kg). Ideal: Ideal body weight: 61.6 kg (135 lb 12.9 oz) Adjusted ideal body weight: 77.8 kg (171 lb 7.7 oz)  Assessment   Status Diagnosis  Controlled Controlled Controlled 1. Chronic pain syndrome   2. Chronic hip pain (Primary Area of Pain) (Bilateral) (L>R)   3. Chronic low back pain (Secondary area of Pain) (Bilateral) (L>R)   4. Chronic shoulder pain (Third area of Pain) (Bilateral) (L>R)   5. Marijuana use   6. Pharmacologic therapy   7. Chronic musculoskeletal pain   8. Neurogenic pain   9. Fibromyalgia      Updated Problems: Problem  Pharmacologic  Therapy    Plan of Care  Problem-specific:  No problem-specific Assessment & Plan notes found for this encounter.  Ms. Oletta Buehring Bonnin has a current medication list which includes the following long-term medication(s): albuterol, aripiprazole, hydrochlorothiazide, oxcarbazepine, simvastatin, tiotropium, trazodone, venlafaxine xr, enoxaparin, gabapentin, lidocaine, methocarbamol, and propranolol.  Pharmacotherapy (Medications Ordered): Meds ordered this encounter  Medications  . methocarbamol (ROBAXIN) 750 MG tablet    Sig: Take 1 tablet (750 mg total) by mouth 3 (three) times daily. Max: 3/day    Dispense:  90 tablet    Refill:  5    Fill one day early if pharmacy is closed on scheduled refill date. [place] substitute for generic if available.  . gabapentin (NEURONTIN) 800 MG tablet    Sig: Take 1 tablet (800 mg total) by mouth every 6 (six) hours.    Dispense:  120 tablet    Refill:  5    Fill one day early if pharmacy is closed on scheduled refill date. [place] substitute for generic if available.  . lidocaine (XYLOCAINE) 5 % ointment    Sig: Apply 1 application topically 4 (four) times daily as needed for moderate pain. (1application= 5 g, or 6 inches of ointment) Max: 20 g/day    Dispense:  50 g    Refill:  PRN    Fill one day early if pharmacy is closed on scheduled refill date. [place] substitute for generic if available.   Orders:  No orders of the defined types were placed in this encounter.  Follow-up plan:   Return if symptoms worsen or fail to improve.      Interventional treatment options: Planned, scheduled, and/or pending:      Under consideration:   Possible bilateral  lumbar facet RFA Possible bilateral SI RFA Diagnostic bilateral IA shoulder joint injection Diagnostic bilateral suprascapular NB Possible bilateral suprascapular RFA Diagnostic left IA knee joint injection Possible series of 5 [place] IA left knee joint injections Diagnostic bilateral genicular  NB Possible bilateral genicular nerve RFA Diagnostic bilateral cervical facet block Possible bilateral cervical facet RFA Diagnostic left CESI   Therapeutic/palliative (PRN):   Diagnostic bilateral lumbar facet block #2  Diagnostic bilateral SIblock #2      Recent Visits No visits were found meeting these conditions. Showing recent visits within past 90 days and meeting all other requirements Today's Visits Date Type Provider Dept  11/30/19 Office Visit Milinda Pointer, MD [facility]  Showing today's visits and meeting all other requirements Future Appointments No visits were found meeting these conditions. Showing future appointments within next 90 days and meeting all other requirements  I discussed the assessment and treatment plan with the patient. The patient was provided an opportunity to ask questions and all were answered. The patient agreed with the plan and demonstrated an understanding of the instructions.  Patient advised to call back or seek an in-person evaluation if the symptoms or condition worsens.  Duration of encounter: 30 minutes.  Note by: Gaspar Cola, MD Date: 11/30/2019; Time: 1:04 PM

## 2019-11-30 ENCOUNTER — Other Ambulatory Visit: Payer: Self-pay

## 2019-11-30 ENCOUNTER — Encounter: Payer: Self-pay | Admitting: Pain Medicine

## 2019-11-30 ENCOUNTER — Ambulatory Visit: Payer: Medicaid Other | Attending: Pain Medicine | Admitting: Pain Medicine

## 2019-11-30 VITALS — BP 117/71 | HR 76 | Temp 98.6°F | Resp 16 | Ht 67.0 in | Wt 225.0 lb

## 2019-11-30 DIAGNOSIS — G894 Chronic pain syndrome: Secondary | ICD-10-CM

## 2019-11-30 DIAGNOSIS — M25511 Pain in right shoulder: Secondary | ICD-10-CM | POA: Diagnosis not present

## 2019-11-30 DIAGNOSIS — M797 Fibromyalgia: Secondary | ICD-10-CM | POA: Insufficient documentation

## 2019-11-30 DIAGNOSIS — Z79899 Other long term (current) drug therapy: Secondary | ICD-10-CM

## 2019-11-30 DIAGNOSIS — M25512 Pain in left shoulder: Secondary | ICD-10-CM | POA: Insufficient documentation

## 2019-11-30 DIAGNOSIS — M25559 Pain in unspecified hip: Secondary | ICD-10-CM | POA: Insufficient documentation

## 2019-11-30 DIAGNOSIS — M7918 Myalgia, other site: Secondary | ICD-10-CM | POA: Diagnosis present

## 2019-11-30 DIAGNOSIS — M792 Neuralgia and neuritis, unspecified: Secondary | ICD-10-CM | POA: Diagnosis present

## 2019-11-30 DIAGNOSIS — F1290 Cannabis use, unspecified, uncomplicated: Secondary | ICD-10-CM | POA: Insufficient documentation

## 2019-11-30 DIAGNOSIS — G8929 Other chronic pain: Secondary | ICD-10-CM | POA: Diagnosis present

## 2019-11-30 DIAGNOSIS — M545 Low back pain: Secondary | ICD-10-CM | POA: Insufficient documentation

## 2019-11-30 HISTORY — DX: Other long term (current) drug therapy: Z79.899

## 2019-11-30 MED ORDER — METHOCARBAMOL 750 MG PO TABS: 750.0000 mg | ORAL_TABLET | Freq: Three times a day (TID) | ORAL | 5 refills | Status: DC

## 2019-11-30 MED ORDER — GABAPENTIN 800 MG PO TABS: 800.0000 mg | ORAL_TABLET | Freq: Four times a day (QID) | ORAL | 5 refills | Status: DC

## 2019-11-30 MED ORDER — LIDOCAINE 5 % EX OINT: 1.0000 "application " | TOPICAL_OINTMENT | Freq: Four times a day (QID) | CUTANEOUS | 99 refills | Status: DC | PRN

## 2019-11-30 NOTE — Progress Notes (Signed)
Safety precautions to be maintained throughout the outpatient stay will include: orient to surroundings, keep bed in low position, maintain call bell within reach at all times, provide assistance with transfer out of bed and ambulation.  

## 2019-12-21 ENCOUNTER — Other Ambulatory Visit: Payer: Self-pay | Admitting: Psychiatry

## 2019-12-21 DIAGNOSIS — F3162 Bipolar disorder, current episode mixed, moderate: Secondary | ICD-10-CM

## 2019-12-30 ENCOUNTER — Telehealth: Payer: Self-pay

## 2019-12-30 NOTE — Telephone Encounter (Signed)
received fax that a prior auth needed for aripiprazole 20mg 

## 2019-12-30 NOTE — Telephone Encounter (Signed)
went online to [email or web address]. submitted and approved from 12-30-19 to 12-29-20 pa case # [id number]

## 2020-01-17 ENCOUNTER — Telehealth (INDEPENDENT_AMBULATORY_CARE_PROVIDER_SITE_OTHER): Payer: Medicaid Other | Admitting: Psychiatry

## 2020-01-17 ENCOUNTER — Encounter: Payer: Self-pay | Admitting: Psychiatry

## 2020-01-17 ENCOUNTER — Other Ambulatory Visit: Payer: Self-pay

## 2020-01-17 ENCOUNTER — Other Ambulatory Visit: Payer: Self-pay | Admitting: Psychiatry

## 2020-01-17 DIAGNOSIS — F410 Panic disorder [episodic paroxysmal anxiety] without agoraphobia: Secondary | ICD-10-CM | POA: Diagnosis not present

## 2020-01-17 DIAGNOSIS — F17200 Nicotine dependence, unspecified, uncomplicated: Secondary | ICD-10-CM

## 2020-01-17 DIAGNOSIS — F1220 Cannabis dependence, uncomplicated: Secondary | ICD-10-CM | POA: Diagnosis not present

## 2020-01-17 DIAGNOSIS — F411 Generalized anxiety disorder: Secondary | ICD-10-CM | POA: Diagnosis not present

## 2020-01-17 DIAGNOSIS — F3162 Bipolar disorder, current episode mixed, moderate: Secondary | ICD-10-CM

## 2020-01-17 DIAGNOSIS — F3178 Bipolar disorder, in full remission, most recent episode mixed: Secondary | ICD-10-CM

## 2020-01-17 MED ORDER — BUSPIRONE HCL 30 MG PO TABS: 30.0000 mg | ORAL_TABLET | Freq: Two times a day (BID) | ORAL | 1 refills | Status: DC

## 2020-01-17 MED ORDER — TRAZODONE HCL 100 MG PO TABS: ORAL_TABLET | ORAL | 0 refills | Status: DC

## 2020-01-17 NOTE — Progress Notes (Signed)
Provider Location : [facility] Patient Location : Home  Participants: Patient , Provider  Virtual Visit via Video Note  I connected with Shelly Wright Besancon on 01/17/20 at  2:00 PM EDT by a video enabled telemedicine application and verified that I am speaking with the correct person using two identifiers.   I discussed the limitations of evaluation and management by telemedicine and the availability of in person appointments. The patient expressed understanding and agreed to proceed.     I discussed the assessment and treatment plan with the patient. The patient was provided an opportunity to ask questions and all were answered. The patient agreed with the plan and demonstrated an understanding of the instructions.   The patient was advised to call back or seek an in-person evaluation if the symptoms worsen or if the condition fails to improve as anticipated.  Video connection was lost at less than 50% of the duration of the visit, at which time the remainder of the visit was completed through audio only     [facility]  01/17/2020 2:53 PM Shelly Wright Streck  MRN:  [id number]  Chief Complaint:  Chief Complaint    Follow-up     HPI: Shelly Wright Odor is a 49 year old Caucasian female on SSI, lives in [place], has a history of bipolar disorder, GAD, cannabis use disorder, panic attacks, tobacco use disorder was evaluated by telemedicine today.  Patient today reports she continues to struggle with irritability and mood swings.  She reports this is more so because of her situational stressors and being off of cannabis for a few days.  She reports she currently shares her house with a friend who was recently homeless.  She reports this is making her more distressed.  She also reports since she has been off of the cannabis for a few days she feels more and more irritable.  She reports she is compliant on her medications as prescribed.  She denies side effects.  She continues to be noncompliant  with psychotherapy sessions and has not made an appointment with a therapist as discussed last visit.  She reports she has friends to talk to and will Pharmacist, hospitallet writer know if she is interested in psychotherapy.  She reports sleep is good on the trazodone.  Patient denies any suicidality, homicidality or perceptual disturbances.  Patient denies any other concerns today.    Visit Diagnosis:    ICD-10-CM   1. Bipolar disorder, in full remission, most recent episode mixed (HCC)  F31.78   2. GAD (generalized anxiety disorder)  F41.1 busPIRone (BUSPAR) 30 MG tablet    traZODone (DESYREL) 100 MG tablet  3. Panic attacks  F41.0 busPIRone (BUSPAR) 30 MG tablet  4. Cannabis use disorder, moderate, dependence (HCC)  F12.20   5. Tobacco use disorder  F17.200     Past Psychiatric History: I have reviewed past psychiatric history from my progress note from 09/16/2018.  Past trials of venlafaxine, trazodone, hydroxyzine  Past Medical History:  Past Medical History:  Diagnosis Date  . Acute right-sided low back pain with right-sided sciatica 08/30/2014  . Anxiety   . Arthritis   . Asthma    in past, no current inhalers  . Bipolar disorder (HCC)   . Cervical spinal cord compression (HCC) 05/07/2012  . COPD (chronic obstructive pulmonary disease) (HCC)   . DDD (degenerative disc disease), lumbar   . Depression   . Dyspnea    with exertion  . Dysrhythmia    irregular-h/o in the  past  . Fibromyalgia   . Hypertension   . Neuritis or radiculitis due to rupture of lumbar intervertebral disc 05/23/2014  . Periprosthetic fracture around prosthetic knee 07/31/2015  . Restless leg syndrome   . Soft tissue lesion of shoulder region 01/24/2012  . 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 Syncope and collapse 04/08/2014  . Thoracic and lumbosacral neuritis 05/07/2012  . Thoracic neuritis 05/07/2012    Past Surgical History:   Procedure Laterality Date  . ABDOMINAL HYSTERECTOMY    . Cervical Fusion X 2    . JOINT REPLACEMENT Right 01/2016   Dr Gavin Potters  . KNEE ARTHROPLASTY Left 02/10/2019   Procedure: COMPUTER ASSISTED TOTAL KNEE ARTHROPLASTY - RNFA;  Surgeon: Donato Heinz, MD;  Location: [facility] ORS;  Service: Orthopedics;  Laterality: Left;  . KNEE ARTHROSCOPY Bilateral    Right knee scope 1998, Left knee scope  . TONSILLECTOMY    . TOTAL KNEE ARTHROPLASTY Right 02/01/2015   Procedure: TOTAL KNEE ARTHROPLASTY;  Surgeon: Erin Sons, MD;  Location: [facility] ORS;  Service: Orthopedics;  Laterality: Right;  . TUBAL LIGATION      Family Psychiatric History: I have reviewed family psychiatric history from my progress note on 09/16/2018  Family History:  Family History  Problem Relation Age of Onset  . Breast cancer Mother   . Colon cancer Father   . Heart attack Father   . Bipolar disorder Sister   . Depression Sister   . Schizophrenia Sister     Social History: I have reviewed social history from my progress note on 09/16/2018 Social History   Socioeconomic History  . Marital status: Legally Separated    Spouse name: Not on file  . Number of children: 1  . Years of education: Not on file  . Highest education level: High school graduate  Occupational History  . Not on file  Tobacco Use  . Smoking status: Current Every Day Smoker    Packs/day: 1.00    Years: 30.00    Pack years: 30.00    Types: Cigarettes  . Smokeless tobacco: Never Used  Vaping Use  . Vaping Use: Never used  Substance and Sexual Activity  . Alcohol use: No  . Drug use: Yes    Types: Marijuana    Comment: every day  . Sexual activity: Yes  Other Topics Concern  . Not on file  Social History Narrative  . Not on file   Social Determinants of Health   Financial Resource Strain:   . Difficulty of Paying Living Expenses: Not on file  Food Insecurity:   . Worried About Programme researcher, broadcasting/film/video in the Last Year: Not on file   . Ran Out of Food in the Last Year: Not on file  Transportation Needs:   . Lack of Transportation (Medical): Not on file  . Lack of Transportation (Non-Medical): Not on file  Physical Activity:   . Days of Exercise per Week: Not on file  . Minutes of Exercise per Session: Not on file  Stress:   . Feeling of Stress : Not on file  Social Connections:   . Frequency of Communication with Friends and Family: Not on file  . Frequency of Social Gatherings with Friends and Family: Not on file  . Attends Religious Services: Not on file  . Active Member of Clubs or Organizations: Not on file  . Attends Banker Meetings: Not on  file  . Marital Status: Not on file    Allergies: No Known Allergies  Metabolic Disorder Labs: No results found for: HGBA1C, MPG No results found for: PROLACTIN No results found for: CHOL, TRIG, HDL, CHOLHDL, VLDL, LDLCALC No results found for: TSH  Therapeutic Level Labs: No results found for: LITHIUM No results found for: VALPROATE No components found for:  CBMZ  Current Medications: Current Outpatient Medications  Medication Sig Dispense Refill  . albuterol (VENTOLIN HFA) 108 (90 Base) MCG/ACT inhaler Inhale 2 puffs into the lungs 4 (four) times daily.     . ARIPiprazole (ABILIFY) 20 MG tablet Take 1 tablet (20 mg total) by mouth daily. 90 tablet 0  . busPIRone (BUSPAR) 30 MG tablet Take 1 tablet (30 mg total) by mouth 2 (two) times daily. 180 tablet 1  . celecoxib (CELEBREX) 200 MG capsule Take 1 capsule (200 mg total) by mouth 2 (two) times daily. (Patient taking differently: Take 200 mg by mouth daily. ) 60 capsule 1  . enoxaparin (LOVENOX) 40 MG/0.4ML injection Inject 0.4 mLs (40 mg total) into the skin daily for 14 doses. 5.6 mL 0  . gabapentin (NEURONTIN) 800 MG tablet Take 1 tablet (800 mg total) by mouth every 6 (six) hours. 120 tablet 5  . hydrochlorothiazide (HYDRODIURIL) 25 MG tablet Take 25 mg by mouth every morning.   1  . lidocaine  (XYLOCAINE) 5 % ointment Apply 1 application topically 4 (four) times daily as needed for moderate pain. (1application= 5 g, or 6 inches of ointment) Max: 20 g/day 50 g PRN  . methocarbamol (ROBAXIN) 750 MG tablet Take 1 tablet (750 mg total) by mouth 3 (three) times daily. Max: 3/day 90 tablet 5  . oxcarbazepine (TRILEPTAL) 600 MG tablet Take 1 tablet (600 mg total) by mouth 2 (two) times daily. For mood 180 tablet 0  . simvastatin (ZOCOR) 20 MG tablet Take 20 mg by mouth at bedtime.     Marland Kitchen tiotropium (SPIRIVA HANDIHALER) 18 MCG inhalation capsule Place 18 mcg into inhaler and inhale every morning.     . traZODone (DESYREL) 100 MG tablet TAKE TWO TABLETS AT BEDTIME AS NEEDED FOR SLEEP 180 tablet 0  . venlafaxine XR (EFFEXOR-XR) 150 MG 24 hr capsule Take 1 capsule (150 mg total) by mouth daily with breakfast. 90 capsule 0   No current facility-administered medications for this visit.     Musculoskeletal: Strength & Muscle Tone: UTA Gait & Station: UTA Patient leans: N/A  Psychiatric Specialty Exam: Review of Systems  Psychiatric/Behavioral:       Mood swings , irritable  All other systems reviewed and are negative.   There were no vitals taken for this visit.There is no height or weight on file to calculate BMI.  General Appearance: UTA  Eye Contact:  UTA  Speech:  Clear and Coherent  Volume:  Normal  Mood:  Irritable  Affect:  Appropriate  Thought Process:  Goal Directed and Descriptions of Associations: Intact  Orientation:  Full (Time, Place, and Person)  Thought Content: Logical   Suicidal Thoughts:  No  Homicidal Thoughts:  No  Memory:  Immediate;   Fair Recent;   Fair Remote;   Fair  Judgement:  Fair  Insight:  Fair  Psychomotor Activity:  UTA  Concentration:  Concentration: Fair and Attention Span: Fair  Recall:  [employer] of Knowledge: Fair  Language: Fair  Akathisia:  No  Handed:  Right  AIMS (if indicated): UTA  Assets:  Communication Skills Desire  for  Improvement Housing Social Support  ADL's:  Intact  Cognition: WNL  Sleep:  Fair   Screenings: PHQ2-9     Office Visit from 11/30/2019 in [facility] Clinical Support from 08/26/2017 in [facility] Office Visit from 09/11/2016 in [facility] Procedure visit from 08/14/2016 in [facility] Office Visit from 07/09/2016 in [facility]  PHQ-2 Total Score 0 0 0 0 0       Assessment and Plan: Denim Start Mazariego is a 49 year old Caucasian female on SSI, lives in [place], has a history of bipolar disorder, anxiety disorder, tobacco use disorder, asthma, COPD, chronic pain was evaluated by telemedicine today.  Patient is biologically predisposed given her family history of mental health problems, substance abuse problems.  Patient with psychosocial stressors of trauma, history of chronic pain, death of her father, relationship struggles ,the pandemic.  Patient currently continues to have substance abuse problems and likely could be struggling with withdrawal from cannabis.  She is also not willing to comply with psychotherapy recommendations.  Patient plan as noted below.  Plan Bipolar disorder in remission Abilify 20 mg p.o. daily Trileptal as prescribed Trazodone 200 mg p.o. nightly for sleep  GAD-unstable Venlafaxine extended release 150 mg p.o. daily Increase BuSpar to 30 mg p.o. twice daily Propranolol 10 mg p.o. 3 times daily as needed Trileptal 600 mg p.o. twice daily  Cannabis use disorder moderate-unstable, likely withdrawing Provided counseling, patient not ready to cut back. It is likely cannabis withdrawal could be affecting her mood.  Patient has been off of it for a few days however is not willing to stop using cannabis.  Tobacco use disorder-unstable She is not ready to  quit Provided counseling  Patient was advised to continue psychotherapy sessions however declined .    Follow-up in clinic in 4 weeks or sooner if needed.  I have spent atleast 20 minutes face to face with patient today. More than 50 % of the time was spent for preparing to see the patient ( e.g., review of test, records ), ordering medications and test ,psychoeducation and supportive psychotherapy and care coordination,as well as documenting clinical information in electronic health record. This note was generated in part or whole with voice recognition software. Voice recognition is usually quite accurate but there are transcription errors that can and very often do occur. I apologize for any typographical errors that were not detected and corrected.       Jomarie Longs, MD 01/17/2020, 2:53 PM

## 2020-01-21 ENCOUNTER — Other Ambulatory Visit: Payer: Self-pay | Admitting: Psychiatry

## 2020-01-21 DIAGNOSIS — F3162 Bipolar disorder, current episode mixed, moderate: Secondary | ICD-10-CM

## 2020-02-28 DIAGNOSIS — M25551 Pain in right hip: Secondary | ICD-10-CM | POA: Diagnosis not present

## 2020-02-28 DIAGNOSIS — G8929 Other chronic pain: Secondary | ICD-10-CM | POA: Diagnosis not present

## 2020-02-28 DIAGNOSIS — M48061 Spinal stenosis, lumbar region without neurogenic claudication: Secondary | ICD-10-CM | POA: Diagnosis not present

## 2020-02-28 DIAGNOSIS — M25552 Pain in left hip: Secondary | ICD-10-CM | POA: Diagnosis not present

## 2020-02-28 DIAGNOSIS — M545 Low back pain, unspecified: Secondary | ICD-10-CM | POA: Diagnosis not present

## 2020-02-29 ENCOUNTER — Telehealth (INDEPENDENT_AMBULATORY_CARE_PROVIDER_SITE_OTHER): Payer: Medicaid Other | Admitting: Psychiatry

## 2020-02-29 ENCOUNTER — Encounter: Payer: Self-pay | Admitting: Psychiatry

## 2020-02-29 ENCOUNTER — Other Ambulatory Visit: Payer: Self-pay

## 2020-02-29 DIAGNOSIS — F3162 Bipolar disorder, current episode mixed, moderate: Secondary | ICD-10-CM | POA: Diagnosis not present

## 2020-02-29 DIAGNOSIS — F1220 Cannabis dependence, uncomplicated: Secondary | ICD-10-CM

## 2020-02-29 DIAGNOSIS — F3178 Bipolar disorder, in full remission, most recent episode mixed: Secondary | ICD-10-CM

## 2020-02-29 DIAGNOSIS — F411 Generalized anxiety disorder: Secondary | ICD-10-CM | POA: Diagnosis not present

## 2020-02-29 DIAGNOSIS — F17200 Nicotine dependence, unspecified, uncomplicated: Secondary | ICD-10-CM

## 2020-02-29 DIAGNOSIS — F410 Panic disorder [episodic paroxysmal anxiety] without agoraphobia: Secondary | ICD-10-CM

## 2020-02-29 MED ORDER — VENLAFAXINE HCL ER 150 MG PO CP24: 150.0000 mg | ORAL_CAPSULE | Freq: Every day | ORAL | 0 refills | Status: DC

## 2020-02-29 MED ORDER — TRAZODONE HCL 100 MG PO TABS: 50.0000 mg | ORAL_TABLET | Freq: Every evening | ORAL | 0 refills | Status: DC | PRN

## 2020-02-29 MED ORDER — OXCARBAZEPINE 600 MG PO TABS: 600.0000 mg | ORAL_TABLET | Freq: Two times a day (BID) | ORAL | 0 refills | Status: DC

## 2020-02-29 MED ORDER — QUETIAPINE FUMARATE 50 MG PO TABS: 50.0000 mg | ORAL_TABLET | Freq: Every day | ORAL | 0 refills | Status: DC

## 2020-02-29 MED ORDER — QUETIAPINE FUMARATE 25 MG PO TABS: 12.5000 mg | ORAL_TABLET | Freq: Every day | ORAL | 0 refills | Status: DC | PRN

## 2020-02-29 NOTE — Progress Notes (Signed)
Provider Location : [facility] Patient Location : Home  Participants: Patient , Provider  Virtual Visit via Video Note  I connected with Shelly Wright on 02/29/20 at  4:20 PM EDT by a video enabled telemedicine application and verified that I am speaking with the correct person using two identifiers.   I discussed the limitations of evaluation and management by telemedicine and the availability of in person appointments. The patient expressed understanding and agreed to proceed.   I discussed the assessment and treatment plan with the patient. The patient was provided an opportunity to ask questions and all were answered. The patient agreed with the plan and demonstrated an understanding of the instructions.   The patient was advised to call back or seek an in-person evaluation if the symptoms worsen or if the condition fails to improve as anticipated.   BH MD OP Progress Note  02/29/2020 5:07 PM Shelly Wright  MRN:  [id number]  Chief Complaint:  Chief Complaint    Follow-up     HPI: Shelly Wright is a 49 year old Caucasian female on SSI, lives in [place], has a history of bipolar disorder, GAD, cannabis use disorder, panic attacks, tobacco use disorder was evaluated by telemedicine today.  Patient today reports she is currently struggling with irritability, mood swings, anxiety as well as sleep problems.  She reports she is going through a lot of psychosocial stressors.  She reports her landlord who was staying with her refused to move out and hence she is currently dealing with legal problems due to the same.  She has upcoming court hearing.  She reports this makes her more distressed.  She has been having some anxiety attacks.  Patient reports she continues to smoke cannabis.  She reports she knows her cannabis well and will not stop using it.  She also reports she wants to continue to smoke cigarettes and is not willing to cut back or quit.  Patient reports she however is  interested in making changes with her medications to address her mood.  She denies any suicidality, homicidality or perceptual disturbances.    Visit Diagnosis:    ICD-10-CM   1. Bipolar 1 disorder, mixed, moderate (HCC)  F31.62 QUEtiapine (SEROQUEL) 50 MG tablet    QUEtiapine (SEROQUEL) 25 MG tablet  2. GAD (generalized anxiety disorder)  F41.1 traZODone (DESYREL) 100 MG tablet    QUEtiapine (SEROQUEL) 25 MG tablet    venlafaxine XR (EFFEXOR-XR) 150 MG 24 hr capsule  3. Panic attacks  F41.0 oxcarbazepine (TRILEPTAL) 600 MG tablet  4. Cannabis use disorder, moderate, dependence (HCC)  F12.20   5. Tobacco use disorder  F17.200     Past Psychiatric History: I have reviewed past psychiatric history from my progress note on 09/16/2018.  Past trials of venlafaxine, trazodone, hydroxyzine  Past Medical History:  Past Medical History:  Diagnosis Date  . Acute right-sided low back pain with right-sided sciatica 08/30/2014  . Anxiety   . Arthritis   . Asthma    in past, no current inhalers  . Bipolar disorder (HCC)   . Cervical spinal cord compression (HCC) 05/07/2012  . COPD (chronic obstructive pulmonary disease) (HCC)   . DDD (degenerative disc disease), lumbar   . Depression   . Dyspnea    with exertion  . Dysrhythmia    irregular-h/o in the past  . Fibromyalgia   . Hypertension   . Neuritis or radiculitis due to rupture of lumbar intervertebral disc 05/23/2014  . Periprosthetic fracture around prosthetic knee  07/31/2015  . Restless leg syndrome   . Soft tissue lesion of shoulder region 01/24/2012  . 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 Syncope and collapse 04/08/2014  . Thoracic and lumbosacral neuritis 05/07/2012  . Thoracic neuritis 05/07/2012    Past Surgical History:  Procedure Laterality Date  . ABDOMINAL HYSTERECTOMY    . Cervical Fusion X 2    . JOINT REPLACEMENT Right 01/2016   Dr  Gavin Potters  . KNEE ARTHROPLASTY Left 02/10/2019   Procedure: COMPUTER ASSISTED TOTAL KNEE ARTHROPLASTY - RNFA;  Surgeon: Donato Heinz, MD;  Location: [facility] ORS;  Service: Orthopedics;  Laterality: Left;  . KNEE ARTHROSCOPY Bilateral    Right knee scope 1998, Left knee scope  . TONSILLECTOMY    . TOTAL KNEE ARTHROPLASTY Right 02/01/2015   Procedure: TOTAL KNEE ARTHROPLASTY;  Surgeon: Erin Sons, MD;  Location: [facility] ORS;  Service: Orthopedics;  Laterality: Right;  . TUBAL LIGATION      Family Psychiatric History: I have reviewed family psychiatric history from my progress note on 09/16/2018  Family History:  Family History  Problem Relation Age of Onset  . Breast cancer Mother   . Colon cancer Father   . Heart attack Father   . Bipolar disorder Sister   . Depression Sister   . Schizophrenia Sister     Social History: I have reviewed social history from my progress note on 09/16/2018 Social History   Socioeconomic History  . Marital status: Legally Separated    Spouse name: Not on file  . Number of children: 1  . Years of education: Not on file  . Highest education level: High school graduate  Occupational History  . Not on file  Tobacco Use  . Smoking status: Current Every Day Smoker    Packs/day: 1.00    Years: 30.00    Pack years: 30.00    Types: Cigarettes  . Smokeless tobacco: Never Used  Vaping Use  . Vaping Use: Never used  Substance and Sexual Activity  . Alcohol use: No  . Drug use: Yes    Types: Marijuana    Comment: every day  . Sexual activity: Yes  Other Topics Concern  . Not on file  Social History Narrative  . Not on file   Social Determinants of Health   Financial Resource Strain:   . Difficulty of Paying Living Expenses: Not on file  Food Insecurity:   . Worried About Programme researcher, broadcasting/film/video in the Last Year: Not on file  . Ran Out of Food in the Last Year: Not on file  Transportation Needs:   . Lack of Transportation (Medical): Not on file   . Lack of Transportation (Non-Medical): Not on file  Physical Activity:   . Days of Exercise per Week: Not on file  . Minutes of Exercise per Session: Not on file  Stress:   . Feeling of Stress : Not on file  Social Connections:   . Frequency of Communication with Friends and Family: Not on file  . Frequency of Social Gatherings with Friends and Family: Not on file  . Attends Religious Services: Not on file  . Active Member of Clubs or Organizations: Not on file  . Attends Banker Meetings: Not on file  . Marital Status: Not on file    Allergies: No Known Allergies  Metabolic Disorder Labs: No results found for: HGBA1C, MPG No results found for:  PROLACTIN No results found for: CHOL, TRIG, HDL, CHOLHDL, VLDL, LDLCALC No results found for: TSH  Therapeutic Level Labs: No results found for: LITHIUM No results found for: VALPROATE No components found for:  CBMZ  Current Medications: Current Outpatient Medications  Medication Sig Dispense Refill  . albuterol (VENTOLIN HFA) 108 (90 Base) MCG/ACT inhaler Inhale 2 puffs into the lungs 4 (four) times daily.     . busPIRone (BUSPAR) 30 MG tablet Take 1 tablet (30 mg total) by mouth 2 (two) times daily. 180 tablet 1  . celecoxib (CELEBREX) 200 MG capsule Take 1 capsule (200 mg total) by mouth 2 (two) times daily. (Patient taking differently: Take 200 mg by mouth daily. ) 60 capsule 1  . enoxaparin (LOVENOX) 40 MG/0.4ML injection Inject 0.4 mLs (40 mg total) into the skin daily for 14 doses. 5.6 mL 0  . gabapentin (NEURONTIN) 800 MG tablet Take 1 tablet (800 mg total) by mouth every 6 (six) hours. 120 tablet 5  . hydrochlorothiazide (HYDRODIURIL) 25 MG tablet Take 25 mg by mouth every morning.   1  . lidocaine (XYLOCAINE) 5 % ointment Apply 1 application topically 4 (four) times daily as needed for moderate pain. (1application= 5 g, or 6 inches of ointment) Max: 20 g/day 50 g PRN  . methocarbamol (ROBAXIN) 750 MG tablet Take  1 tablet (750 mg total) by mouth 3 (three) times daily. Max: 3/day 90 tablet 5  . oxcarbazepine (TRILEPTAL) 600 MG tablet Take 1 tablet (600 mg total) by mouth 2 (two) times daily. For mood 180 tablet 0  . QUEtiapine (SEROQUEL) 25 MG tablet Take 0.5-1 tablets (12.5-25 mg total) by mouth daily as needed. For severe anxiety and agitation only 90 tablet 0  . QUEtiapine (SEROQUEL) 50 MG tablet Take 1 tablet (50 mg total) by mouth at bedtime. 90 tablet 0  . simvastatin (ZOCOR) 20 MG tablet Take 20 mg by mouth at bedtime.     Marland Kitchen tiotropium (SPIRIVA HANDIHALER) 18 MCG inhalation capsule Place 18 mcg into inhaler and inhale every morning.     . traZODone (DESYREL) 100 MG tablet Take 0.5 tablets (50 mg total) by mouth at bedtime as needed for sleep. 90 tablet 0  . venlafaxine XR (EFFEXOR-XR) 150 MG 24 hr capsule Take 1 capsule (150 mg total) by mouth daily with breakfast. 90 capsule 0   No current facility-administered medications for this visit.     Musculoskeletal: Strength & Muscle Tone: UTA Gait & Station: normal Patient leans: N/A  Psychiatric Specialty Exam: Review of Systems  Psychiatric/Behavioral: Positive for dysphoric mood and sleep disturbance. Negative for agitation, behavioral problems, confusion, decreased concentration, hallucinations, self-injury and suicidal ideas. The patient is nervous/anxious. The patient is not hyperactive.   All other systems reviewed and are negative.   There were no vitals taken for this visit.There is no height or weight on file to calculate BMI.  General Appearance: Casual  Eye Contact:  Fair  Speech:  Clear and Coherent  Volume:  Normal  Mood:  Anxious, Depressed and Irritable  Affect:  Congruent  Thought Process:  Goal Directed and Descriptions of Associations: Intact  Orientation:  Full (Time, Place, and Person)  Thought Content: Logical   Suicidal Thoughts:  No  Homicidal Thoughts:  No  Memory:  Immediate;   Fair Recent;   Fair Remote;   Fair   Judgement:  Fair  Insight:  Fair  Psychomotor Activity:  Normal  Concentration:  Concentration: Fair and Attention Span: Fair  Recall:  Fair  Progress EnergyFund of Knowledge: Fair  Language: Fair  Akathisia:  No  Handed:  Right  AIMS (if indicated): UTA  Assets:  Communication Skills Desire for Improvement Housing Social Support  ADL's:  Intact  Cognition: WNL  Sleep:  Poor   Screenings: PHQ2-9     [facility] Visit from 11/30/2019 in [facility] Clinical Support from 08/26/2017 [facility] Office [facility]/11[facility] Procedure visit from 08/14/2016 [facility] Office Visit from 07[facility]09/2016 in [facility]  PHQ-2 Total Score 0 0 0 0 0       Assessment and Plan: Valentino SaxonValerie D Silberstein is a 49 year old Caucasian female on SSI, lives in [place], has a history of bipolar disorder, anxiety disorder, tobacco use disorder, asthma, COPD, chronic pain was evaluated by telemedicine today.  Patient is biologically predisposed given her family history of mental health problems, substance abuse problems.  Patient with psychosocial stressors of trauma, history of chronic pain, death of her father, relationship struggles, pandemic.  Patient continues to have substance abuse problems and is not willing to quit.  Patient continues to have mood symptoms as noted above.  Plan as noted below.  Plan Bipolar disorder -unstable Discontinue Abilify. Start Seroquel 50 mg p.o. nightly. Trileptal as prescribed Reduce trazodone to 50 mg p.o. nightly as needed.  Discusse drug to drug interaction between Seroquel and trazodone.  GAD-unstable Venlafaxine extended release 150 mg p.o. daily BuSpar 30 mg p.o. twice daily Propranolol 10 mg p.o. 3 times daily as needed Trileptal 600 mg p.o. twice  daily Add Seroquel as noted above. We will also add a low dosage of Seroquel 12.5 to 25 mg p.o. daily as needed for severe anxiety attacks.  Cannabis use disorder moderate-unstable  Provided counseling, patient not ready to cut back.   Tobacco use disorder-unstable She is not ready to quit Provided counseling  Patient was advised to restart CBT-patient declined.   Follow-up in clinic in 4 weeks or sooner if needed.  I have spent atleast 20 minutes face to face by video with patient today. More than 50 % of the time was spent for preparing to see the patient ( e.g., review of test, records ), ordering medications and test ,psychoeducation and supportive psychotherapy and care coordination,as well as documenting clinical information in electronic health record. This note was generated in part or whole with voice recognition software. Voice recognition is usually quite accurate but there are transcription errors that can and very often do occur. I apologize for any typographical errors that were not detected and corrected.       Jomarie LongsSaramma Alucard Fearnow, MD 03/01/2020, 8:10 AM

## 2020-02-29 NOTE — Patient Instructions (Signed)
Quetiapine tablets What is this medicine? QUETIAPINE (kwe TYE a peen) is an antipsychotic. It is used to treat schizophrenia and bipolar disorder, also known as manic-depression. This medicine Ledin be used for other purposes; ask your health care provider or pharmacist if you have questions. COMMON BRAND NAME(S): Seroquel What should I tell my health care provider before I take this medicine? They need to know if you have any of these conditions:  blockage in your bowel  cataracts  constipation  dehydration  diabetes  difficulty swallowing  glaucoma  heart disease  history of breast cancer  kidney disease  liver disease  low blood counts, like low white cell, platelet, or red cell counts  low blood pressure or dizziness when standing up  Parkinson's disease  previous heart attack  prostate disease  seizures  stomach or intestine problems  suicidal thoughts, plans or attempt; a previous suicide attempt by you or a family member  thyroid disease  trouble passing urine  an unusual or allergic reaction to quetiapine, other medicines, foods, dyes, or preservatives  pregnant or trying to get pregnant  breast-feeding How should I use this medicine? Take this medicine by mouth. Swallow it with a drink of water. Follow the directions on the prescription label. If it upsets your stomach you can take it with food. Take your medicine at regular intervals. Do not take it more often than directed. Do not stop taking except on the advice of your doctor or health care professional. A special MedGuide will be given to you by the pharmacist with each prescription and refill. Be sure to read this information carefully each time. Talk to your pediatrician regarding the use of this medicine in children. While this drug Cozzens be prescribed for children as young as 10 years for selected conditions, precautions do apply. Patients over age 65 years Shor have a stronger reaction to this  medicine and need smaller doses. Overdosage: If you think you have taken too much of this medicine contact a poison control center or emergency room at once. NOTE: This medicine is only for you. Do not share this medicine with others. What if I miss a dose? If you miss a dose, take it as soon as you can. If it is almost time for your next dose, take only that dose. Do not take double or extra doses. What Tyer interact with this medicine? Do not take this medicine with any of the following medications:  cisapride  dronedarone  fluconazole  metoclopramide  pimozide  posaconazole  thioridazine This medicine Rape also interact with the following medications:  alcohol  antihistamines for allergy cough and cold  antiviral medicines for HIV or AIDS  atropine  certain medicines for bladder problems like oxybutynin, tolterodine  certain medicines for blood pressure  certain medicines for depression, anxiety, or psychotic disturbances  certain medicines for diabetes  certain medicines for stomach problems like dicyclomine, hyoscyamine  certain medicines for travel sickness like scopolamine  certain medicines for Parkinson's disease  certain medicines for seizures like carbamazepine, phenobarbital, phenytoin  cimetidine  erythromycin  ipratropium  other medicines that prolong the QT interval (cause an abnormal heart rhythm) like dofetilide  rifampin  steroid medicines like prednisone or cortisone This list Leyba not describe all possible interactions. Give your health care provider a list of all the medicines, herbs, non-prescription drugs, or dietary supplements you use. Also tell them if you smoke, drink alcohol, or use illegal drugs. Some items Raudenbush interact with your medicine. What   should I watch for while using this medicine? Visit your health care professional for regular checks on your progress. Tell your health care professional if symptoms do not start to get  better or if they get worse. Do not stop taking except on your health care professional's advice. You Rollinson develop a severe reaction. Your health care professional will tell you how much medicine to take. You Crumble need to have an eye exam before and during use of this medicine. This medicine Marteney increase blood sugar. Ask your health care provider if changes in diet or medicines are needed if you have diabetes. Patients and their families should watch out for new or worsening depression or thoughts of suicide. Also watch out for sudden or severe changes in feelings such as feeling anxious, agitated, panicky, irritable, hostile, aggressive, impulsive, severely restless, overly excited and hyperactive, or not being able to sleep. If this happens, especially at the beginning of antidepressant treatment or after a change in dose, call your health care professional. You Erxleben get dizzy or drowsy. Do not drive, use machinery, or do anything that needs mental alertness until you know how this medicine affects you. Do not stand or sit up quickly, especially if you are an older patient. This reduces the risk of dizzy or fainting spells. Alcohol Katayama interfere with the effect of this medicine. Avoid alcoholic drinks. This drug can cause problems with controlling your body temperature. It can lower the response of your body to cold temperatures. If possible, stay indoors during cold weather. If you must go outdoors, wear warm clothes. It can also lower the response of your body to heat. Do not overheat. Do not over-exercise. Stay out of the sun when possible. If you must be in the sun, wear cool clothing. Drink plenty of water. If you have trouble controlling your body temperature, call your health care provider right away. What side effects Guidotti I notice from receiving this medicine? Side effects that you should report to your doctor or health care professional as soon as possible:  allergic reactions like skin rash,  itching or hives, swelling of the face, lips, or tongue  breathing problems  changes in vision  confusion  elevated mood, decreased need for sleep, racing thoughts, impulsive behavior  eye pain  fast, irregular heartbeat  fever or chills, sore throat  inability to keep still  males: prolonged or painful erection  problems with balance, talking, walking  redness, blistering, peeling, or loosening of the skin, including inside the mouth  seizures  signs and symptoms of high blood sugar such as being more thirsty or hungry or having to urinate more than normal. You Bossard also feel very tired or have blurry vision  signs and symptoms of hypothyroidism like fatigue; increased sensitivity to cold; weight gain; hoarseness; thinning hair  signs and symptoms of low blood pressure like dizziness; feeling faint or lightheaded; falls; unusually weak or tired  signs and symptoms of neuroleptic malignant syndrome like confusion; fast, irregular heartbeat; high fever; increased sweating; stiff muscles  sudden numbness or weakness of the face, arm, or leg  suicidal thoughts or other mood changes  trouble swallowing  uncontrollable movements of the arms, face, head, mouth, neck, or upper body Side effects that usually do not require medical attention (report to your doctor or health care professional if they continue or are bothersome):  change in sex drive or performance  constipation  drowsiness  dry mouth  upset stomach  weight gain This list Goelz   not describe all possible side effects. Call your doctor for medical advice about side effects. You Kennard report side effects to FDA at 1-800-FDA-1088. Where should I keep my medicine? Keep out of the reach of children. Store at room temperature between 15 and 30 degrees C (59 and 86 degrees F). Throw away any unused medicine after the expiration date. NOTE: This sheet is a summary. It Pinho not cover all possible information. If you  have questions about this medicine, talk to your doctor, pharmacist, or health care provider.  2020 Elsevier/Gold Standard (2019-03-03 11:59:11)  

## 2020-03-01 DIAGNOSIS — M48061 Spinal stenosis, lumbar region without neurogenic claudication: Secondary | ICD-10-CM | POA: Diagnosis not present

## 2020-03-01 DIAGNOSIS — M4726 Other spondylosis with radiculopathy, lumbar region: Secondary | ICD-10-CM | POA: Diagnosis not present

## 2020-03-20 ENCOUNTER — Other Ambulatory Visit: Payer: Self-pay | Admitting: Family

## 2020-03-20 DIAGNOSIS — R5383 Other fatigue: Secondary | ICD-10-CM | POA: Diagnosis not present

## 2020-03-20 DIAGNOSIS — F319 Bipolar disorder, unspecified: Secondary | ICD-10-CM | POA: Diagnosis not present

## 2020-03-20 DIAGNOSIS — J449 Chronic obstructive pulmonary disease, unspecified: Secondary | ICD-10-CM | POA: Diagnosis not present

## 2020-03-20 DIAGNOSIS — N644 Mastodynia: Secondary | ICD-10-CM

## 2020-03-20 DIAGNOSIS — G629 Polyneuropathy, unspecified: Secondary | ICD-10-CM | POA: Diagnosis not present

## 2020-03-20 DIAGNOSIS — Z1331 Encounter for screening for depression: Secondary | ICD-10-CM | POA: Diagnosis not present

## 2020-03-20 DIAGNOSIS — Z13220 Encounter for screening for lipoid disorders: Secondary | ICD-10-CM | POA: Diagnosis not present

## 2020-03-20 DIAGNOSIS — I10 Essential (primary) hypertension: Secondary | ICD-10-CM | POA: Diagnosis not present

## 2020-03-20 DIAGNOSIS — D519 Vitamin B12 deficiency anemia, unspecified: Secondary | ICD-10-CM | POA: Diagnosis not present

## 2020-03-20 DIAGNOSIS — E559 Vitamin D deficiency, unspecified: Secondary | ICD-10-CM | POA: Diagnosis not present

## 2020-03-20 DIAGNOSIS — R7301 Impaired fasting glucose: Secondary | ICD-10-CM | POA: Diagnosis not present

## 2020-03-20 DIAGNOSIS — F5105 Insomnia due to other mental disorder: Secondary | ICD-10-CM | POA: Diagnosis not present

## 2020-03-20 DIAGNOSIS — N6320 Unspecified lump in the left breast, unspecified quadrant: Secondary | ICD-10-CM

## 2020-03-20 DIAGNOSIS — Z131 Encounter for screening for diabetes mellitus: Secondary | ICD-10-CM | POA: Diagnosis not present

## 2020-03-22 ENCOUNTER — Other Ambulatory Visit: Payer: Self-pay | Admitting: *Deleted

## 2020-03-22 ENCOUNTER — Inpatient Hospital Stay
Admission: RE | Admit: 2020-03-22 | Discharge: 2020-03-22 | Disposition: A | Discharge status: Home / Self Care | Payer: Self-pay | Source: Ambulatory Visit | Attending: *Deleted | Admitting: *Deleted

## 2020-03-22 DIAGNOSIS — Z1231 Encounter for screening mammogram for malignant neoplasm of breast: Secondary | ICD-10-CM

## 2020-03-30 ENCOUNTER — Encounter: Payer: Self-pay | Admitting: Psychiatry

## 2020-03-30 ENCOUNTER — Telehealth (INDEPENDENT_AMBULATORY_CARE_PROVIDER_SITE_OTHER): Payer: Medicaid Other | Admitting: Psychiatry

## 2020-03-30 ENCOUNTER — Other Ambulatory Visit: Payer: Self-pay

## 2020-03-30 DIAGNOSIS — F411 Generalized anxiety disorder: Secondary | ICD-10-CM | POA: Diagnosis not present

## 2020-03-30 DIAGNOSIS — F3162 Bipolar disorder, current episode mixed, moderate: Secondary | ICD-10-CM | POA: Diagnosis not present

## 2020-03-30 DIAGNOSIS — F410 Panic disorder [episodic paroxysmal anxiety] without agoraphobia: Secondary | ICD-10-CM

## 2020-03-30 DIAGNOSIS — F1220 Cannabis dependence, uncomplicated: Secondary | ICD-10-CM | POA: Diagnosis not present

## 2020-03-30 DIAGNOSIS — F17200 Nicotine dependence, unspecified, uncomplicated: Secondary | ICD-10-CM

## 2020-03-30 MED ORDER — QUETIAPINE FUMARATE 100 MG PO TABS: 150.0000 mg | ORAL_TABLET | Freq: Every day | ORAL | 1 refills | Status: DC

## 2020-03-30 MED ORDER — CITALOPRAM HYDROBROMIDE 10 MG PO TABS: 10.0000 mg | ORAL_TABLET | Freq: Every day | ORAL | 1 refills | Status: DC

## 2020-03-30 MED ORDER — VENLAFAXINE HCL ER 37.5 MG PO CP24: 37.5000 mg | ORAL_CAPSULE | Freq: Every day | ORAL | 1 refills | Status: DC

## 2020-03-30 MED ORDER — QUETIAPINE FUMARATE 50 MG PO TABS: 50.0000 mg | ORAL_TABLET | Freq: Every day | ORAL | 1 refills | Status: DC | PRN

## 2020-03-30 NOTE — Progress Notes (Signed)
Virtual Visit via Video Note  I connected with Shelly Wright on 03/30/20 at  9:40 AM EST by a video enabled telemedicine application and verified that I am speaking with the correct person using two identifiers.   Location Provider Location : [facility] Patient Location : Home  Participants: Patient , Provider  I discussed the limitations of evaluation and management by telemedicine and the availability of in person appointments. The patient expressed understanding and agreed to proceed.    I discussed the assessment and treatment plan with the patient. The patient was provided an opportunity to ask questions and all were answered. The patient agreed with the plan and demonstrated an understanding of the instructions.   The patient was advised to call back or seek an in-person evaluation if the symptoms worsen or if the condition fails to improve as anticipated.  [facility] MD OP Progress Note  03/30/2020 3:08 PM Shelly Wright  MRN:  [id number]  Chief Complaint:  Chief Complaint    Follow-up     HPI: Shelly Wright is a 49 year old Caucasian female on SSI, lives in [place], has a history of bipolar disorder, GAD, panic attacks, cannabis use disorder moderate, tobacco use disorder was evaluated by telemedicine today.  Patient today reports she is currently struggling with a lot of psychosocial stressors. She reports she continues to deal with her legal issues with her housing. She reports that continues to be a stressor for her. She currently feels overwhelmed, has mood swings and anxiety symptoms. She reports she is interested in medication changes. She does not believe the venlafaxine at this dosage is beneficial. She is interested in changing medication to something new. She reports she has tried and failed multiple medications like Zoloft, Lexapro, duloxetine, Paxil. She however reports she has never tried Celexa and is willing to give it a try.  She reports the nighttime medication which is  Seroquel at this dosage is not beneficial. She denies side effects. She is aware about long-term risk factors from Seroquel as well as tardive dyskinesia. She however is willing to give it a chance.  Patient continues to smoke cannabis and reports she is not willing to quit.  Patient continues to smoke cigarettes.  She is not interested in psychotherapy sessions.  She currently denies any suicidality, homicidality or perceptual disturbances.   Visit Diagnosis:    ICD-10-CM   1. Bipolar 1 disorder, mixed, moderate (HCC)  F31.62 QUEtiapine (SEROQUEL) 50 MG tablet    QUEtiapine (SEROQUEL) 100 MG tablet  2. GAD (generalized anxiety disorder)  F41.1 citalopram (CELEXA) 10 MG tablet    venlafaxine XR (EFFEXOR-XR) 37.5 MG 24 hr capsule  3. Panic attacks  F41.0 citalopram (CELEXA) 10 MG tablet    QUEtiapine (SEROQUEL) 100 MG tablet    venlafaxine XR (EFFEXOR-XR) 37.5 MG 24 hr capsule  4. Cannabis use disorder, moderate, dependence (HCC)  F12.20   5. Tobacco use disorder  F17.200     Past Psychiatric History: I have reviewed past psychiatric history from my progress note on 09/16/2018. Past trials of venlafaxine, trazodone, hydroxyzine, Zoloft, Lexapro, duloxetine, Paxil  Past Medical History:  Past Medical History:  Diagnosis Date  . Acute right-sided low back pain with right-sided sciatica 08/30/2014  . Anxiety   . Arthritis   . Asthma    in past, no current inhalers  . Bipolar disorder (HCC)   . Cervical spinal cord compression (HCC) 05/07/2012  . COPD (chronic obstructive pulmonary disease) (HCC)   . DDD (degenerative disc  disease), lumbar   . Depression   . Dyspnea    with exertion  . Dysrhythmia    irregular-h/o in the past  . Fibromyalgia   . Hypertension   . Neuritis or radiculitis due to rupture of lumbar intervertebral disc 05/23/2014  . Periprosthetic fracture around prosthetic knee 07/31/2015  . Restless leg syndrome   . Soft tissue lesion of shoulder region 01/24/2012  .  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 Syncope and collapse 04/08/2014  . Thoracic and lumbosacral neuritis 05/07/2012  . Thoracic neuritis 05/07/2012    Past Surgical History:  Procedure Laterality Date  . ABDOMINAL HYSTERECTOMY    . Cervical Fusion X 2    . JOINT REPLACEMENT Right 01/2016   Dr Gavin Potters  . KNEE ARTHROPLASTY Left 02/10/2019   Procedure: COMPUTER ASSISTED TOTAL KNEE ARTHROPLASTY - RNFA;  Surgeon: Donato Heinz, MD;  Location: [facility] ORS;  Service: Orthopedics;  Laterality: Left;  . KNEE ARTHROSCOPY Bilateral    Right knee scope 1998, Left knee scope  . TONSILLECTOMY    . TOTAL KNEE ARTHROPLASTY Right 02/01/2015   Procedure: TOTAL KNEE ARTHROPLASTY;  Surgeon: Erin Sons, MD;  Location: [facility] ORS;  Service: Orthopedics;  Laterality: Right;  . TUBAL LIGATION      Family Psychiatric History: I have reviewed family psychiatric history from my progress note on 09/16/2018  Family History:  Family History  Problem Relation Age of Onset  . Breast cancer Mother   . Colon cancer Father   . Heart attack Father   . Bipolar disorder Sister   . Depression Sister   . Schizophrenia Sister     Social History: Reviewed social history from my progress note on 09/16/2018 Social History   Socioeconomic History  . Marital status: Legally Separated    Spouse name: Not on file  . Number of children: 1  . Years of education: Not on file  . Highest education level: High school graduate  Occupational History  . Not on file  Tobacco Use  . Smoking status: Current Every Day Smoker    Packs/day: 1.00    Years: 30.00    Pack years: 30.00    Types: Cigarettes  . Smokeless tobacco: Never Used  Vaping Use  . Vaping Use: Never used  Substance and Sexual Activity  . Alcohol use: No  . Drug use: Yes    Types: Marijuana    Comment: every day  . Sexual activity: Yes  Other Topics Concern  . Not on file   Social History Narrative  . Not on file   Social Determinants of Health   Financial Resource Strain:   . Difficulty of Paying Living Expenses: Not on file  Food Insecurity:   . Worried About Programme researcher, broadcasting/film/video in the Last Year: Not on file  . Ran Out of Food in the Last Year: Not on file  Transportation Needs:   . Lack of Transportation (Medical): Not on file  . Lack of Transportation (Non-Medical): Not on file  Physical Activity:   . Days of Exercise per Week: Not on file  . Minutes of Exercise per Session: Not on file  Stress:   . Feeling of Stress : Not on file  Social Connections:   . Frequency of Communication with Friends and Family: Not on file  . Frequency of Social Gatherings with Friends and Family: Not on file  . Attends Religious Services: Not  on file  . Active Member of Clubs or Organizations: Not on file  . Attends BankerClub or Organization Meetings: Not on file  . Marital Status: Not on file    Allergies: No Known Allergies  Metabolic Disorder Labs: No results found for: HGBA1C, MPG No results found for: PROLACTIN No results found for: CHOL, TRIG, HDL, CHOLHDL, VLDL, LDLCALC No results found for: TSH  Therapeutic Level Labs: No results found for: LITHIUM No results found for: VALPROATE No components found for:  CBMZ  Current Medications: Current Outpatient Medications  Medication Sig Dispense Refill  . albuterol (VENTOLIN HFA) 108 (90 Base) MCG/ACT inhaler Inhale 2 puffs into the lungs 4 (four) times daily.     . busPIRone (BUSPAR) 30 MG tablet Take 1 tablet (30 mg total) by mouth 2 (two) times daily. 180 tablet 1  . celecoxib (CELEBREX) 200 MG capsule Take 1 capsule (200 mg total) by mouth 2 (two) times daily. (Patient taking differently: Take 200 mg by mouth daily. ) 60 capsule 1  . citalopram (CELEXA) 10 MG tablet Take 1 tablet (10 mg total) by mouth daily. 30 tablet 1  . enoxaparin (LOVENOX) 40 MG/0.4ML injection Inject 0.4 mLs (40 mg total) into the  skin daily for 14 doses. 5.6 mL 0  . gabapentin (NEURONTIN) 800 MG tablet Take 1 tablet (800 mg total) by mouth every 6 (six) hours. 120 tablet 5  . hydrochlorothiazide (HYDRODIURIL) 25 MG tablet Take 25 mg by mouth every morning.   1  . lidocaine (XYLOCAINE) 5 % ointment Apply 1 application topically 4 (four) times daily as needed for moderate pain. (1application= 5 g, or 6 inches of ointment) Max: 20 g/day 50 g PRN  . methocarbamol (ROBAXIN) 750 MG tablet Take 1 tablet (750 mg total) by mouth 3 (three) times daily. Max: 3/day 90 tablet 5  . oxcarbazepine (TRILEPTAL) 600 MG tablet Take 1 tablet (600 mg total) by mouth 2 (two) times daily. For mood 180 tablet 0  . QUEtiapine (SEROQUEL) 100 MG tablet Take 1.5 tablets (150 mg total) by mouth at bedtime. For mood and sleep 45 tablet 1  . QUEtiapine (SEROQUEL) 50 MG tablet Take 1 tablet (50 mg total) by mouth daily as needed. For severe anxiety attacks 30 tablet 1  . simvastatin (ZOCOR) 20 MG tablet Take 20 mg by mouth at bedtime.     Marland Kitchen. tiotropium (SPIRIVA HANDIHALER) 18 MCG inhalation capsule Place 18 mcg into inhaler and inhale every morning.     . traZODone (DESYREL) 100 MG tablet Take 0.5 tablets (50 mg total) by mouth at bedtime as needed for sleep. 90 tablet 0  . venlafaxine XR (EFFEXOR-XR) 37.5 MG 24 hr capsule Take 1 capsule (37.5 mg total) by mouth daily with breakfast. Stop taking after 4 weeks 30 capsule 1   No current facility-administered medications for this visit.     Musculoskeletal: Strength & Muscle Tone: UTA Gait & Station: UTA Patient leans: N/A  Psychiatric Specialty Exam: Review of Systems  Psychiatric/Behavioral: Positive for decreased concentration, dysphoric mood and sleep disturbance. The patient is nervous/anxious.   All other systems reviewed and are negative.   There were no vitals taken for this visit.There is no height or weight on file to calculate BMI.  General Appearance: Casual  Eye Contact:  Fair  Speech:   Clear and Coherent  Volume:  Normal  Mood:  Anxious, Dysphoric and Irritable  Affect:  Congruent  Thought Process:  Goal Directed and Descriptions of Associations: Intact  Orientation:  Full (Time, Place, and Person)  Thought Content: Rumination   Suicidal Thoughts:  No  Homicidal Thoughts:  No  Memory:  Immediate;   Fair Recent;   Fair Remote;   Fair  Judgement:  Fair  Insight:  Fair  Psychomotor Activity:  Normal  Concentration:  Concentration: Fair and Attention Span: Fair  Recall:  [employer] of Knowledge: Fair  Language: Fair  Akathisia:  No  Handed:  Right  AIMS (if indicated): UTA  Assets:  Communication Skills Desire for Improvement Housing Social Support  ADL's:  Intact  Cognition: WNL  Sleep:  Poor   Screenings: PHQ2-9     Office Visit from 11/30/2019 in [facility] Clinical Support from 08/26/2017 in [facility] Office Visit from 09/11/2016 in [facility] Procedure visit from 08/14/2016 in [facility] Office Visit from 07/09/2016 in [facility]  PHQ-2 Total Score 0 0 0 0 0       Assessment and Plan: Shelly Wright is a 49 year old Caucasian female on SSI, lives in [place], has a history of bipolar disorder, anxiety disorder, tobacco use disorder, asthma, COPD, chronic pain, was evaluated by telemedicine today. Patient is biologically predisposed given her family history of mental health problems, substance abuse problems. Patient with psychosocial stressors of trauma, history of chronic pain, death of her father, relationship struggles, pandemic. Patient continues to have substance abuse problems and is currently not willing to quit. She also declines CBT. Patient will benefit from the following plan.  Plan Bipolar disorder-unstable Increase Seroquel  to 150 mg p.o. nightly Start Seroquel 50 mg p.o. daily as needed for severe anxiety symptoms. Advised patient to limit use. Trileptal as prescribed Continue trazodone at reduced dose to 50 mg p.o. nightly as needed  GAD-unstable Taper off venlafaxine. Advised patient to take 37.5 mg p.o. daily for the next 4 weeks and stop taking it. Start Celexa 10 mg p.o. daily. BuSpar 30 mg p.o. twice daily Propranolol 10 mg p.o. 3 times daily as needed Trileptal 600 mg p.o. twice daily   Cannabis use disorder moderate-unstable She is not willing to cut back.  Tobacco use disorder-unstable She is not ready to quit.  Patient is noncompliant with treatment recommendations including CBT referral.  Provided education and long-term risk of Seroquel and other medications. She is aware about tardive dyskinesia and metabolic syndrome.  Follow-up in clinic in 4 weeks or sooner if needed.  I have spent atleast 20 minutes face to face by video with patient today. More than 50 % of the time was spent for preparing to see the patient ( e.g., review of test, records ),ordering medications and test ,psychoeducation and supportive psychotherapy and care coordination,as well as documenting clinical information in electronic health record. This note was generated in part or whole with voice recognition software. Voice recognition is usually quite accurate but there are transcription errors that can and very often do occur. I apologize for any typographical errors that were not detected and corrected.       Jomarie Longs, MD 03/30/2020, 3:08 PM

## 2020-03-30 NOTE — Patient Instructions (Signed)
Citalopram tablets What is this medicine? CITALOPRAM (sye TAL oh pram) is a medicine for depression. This medicine Rainey be used for other purposes; ask your health care provider or pharmacist if you have questions. COMMON BRAND NAME(S): Celexa What should I tell my health care provider before I take this medicine? They need to know if you have any of these conditions:  bleeding disorders  bipolar disorder or a family history of bipolar disorder  glaucoma  heart disease  history of irregular heartbeat  kidney disease  liver disease  low levels of magnesium or potassium in the blood  receiving electroconvulsive therapy  seizures  suicidal thoughts, plans, or attempt; a previous suicide attempt by you or a family member  take medicines that treat or prevent blood clots  thyroid disease  an unusual or allergic reaction to citalopram, escitalopram, other medicines, foods, dyes, or preservatives  pregnant or trying to become pregnant  breast-feeding How should I use this medicine? Take this medicine by mouth with a glass of water. Follow the directions on the prescription label. You can take it with or without food. Take your medicine at regular intervals. Do not take your medicine more often than directed. Do not stop taking this medicine suddenly except upon the advice of your doctor. Stopping this medicine too quickly Steelman cause serious side effects or your condition Lantzy worsen. A special MedGuide will be given to you by the pharmacist with each prescription and refill. Be sure to read this information carefully each time. Talk to your pediatrician regarding the use of this medicine in children. Special care Stolp be needed. Patients over 51 years old Dahmen have a stronger reaction and need a smaller dose. Overdosage: If you think you have taken too much of this medicine contact a poison control center or emergency room at once. NOTE: This medicine is only for you. Do not share  this medicine with others. What if I miss a dose? If you miss a dose, take it as soon as you can. If it is almost time for your next dose, take only that dose. Do not take double or extra doses. What Iser interact with this medicine? Do not take this medicine with any of the following medications:  certain medicines for fungal infections like fluconazole, itraconazole, ketoconazole, posaconazole, voriconazole  cisapride  dronedarone  escitalopram  linezolid  MAOIs like Carbex, Eldepryl, Marplan, Nardil, and Parnate  methylene blue (injected into a vein)  pimozide  thioridazine This medicine Summerhill also interact with the following medications:  alcohol  amphetamines  aspirin and aspirin-like medicines  carbamazepine  certain medicines for depression, anxiety, or psychotic disturbances  certain medicines for infections like chloroquine, clarithromycin, erythromycin, furazolidone, isoniazid, pentamidine  certain medicines for migraine headaches like almotriptan, eletriptan, frovatriptan, naratriptan, rizatriptan, sumatriptan, zolmitriptan  certain medicines for sleep  certain medicines that treat or prevent blood clots like dalteparin, enoxaparin, warfarin  cimetidine  diuretics  dofetilide  fentanyl  lithium  methadone  metoprolol  NSAIDs, medicines for pain and inflammation, like ibuprofen or naproxen  omeprazole  other medicines that prolong the QT interval (cause an abnormal heart rhythm)  procarbazine  rasagiline  supplements like St. Keanan's wort, kava kava, valerian  tramadol  tryptophan  ziprasidone This list Dudziak not describe all possible interactions. Give your health care provider a list of all the medicines, herbs, non-prescription drugs, or dietary supplements you use. Also tell them if you smoke, drink alcohol, or use illegal drugs. Some items Morison interact with  your medicine. What should I watch for while using this medicine? Tell your  doctor if your symptoms do not get better or if they get worse. Visit your doctor or health care professional for regular checks on your progress. Because it Ferner take several weeks to see the full effects of this medicine, it is important to continue your treatment as prescribed by your doctor. Patients and their families should watch out for new or worsening thoughts of suicide or depression. Also watch out for sudden changes in feelings such as feeling anxious, agitated, panicky, irritable, hostile, aggressive, impulsive, severely restless, overly excited and hyperactive, or not being able to sleep. If this happens, especially at the beginning of treatment or after a change in dose, call your health care professional. Bonita QuinYou Maxfield get drowsy or dizzy. Do not drive, use machinery, or do anything that needs mental alertness until you know how this medicine affects you. Do not stand or sit up quickly, especially if you are an older patient. This reduces the risk of dizzy or fainting spells. Alcohol Szwed interfere with the effect of this medicine. Avoid alcoholic drinks. Your mouth Retter get dry. Chewing sugarless gum or sucking hard candy, and drinking plenty of water will help. Contact your doctor if the problem does not go away or is severe. What side effects Borras I notice from receiving this medicine? Side effects that you should report to your doctor or health care professional as soon as possible:  allergic reactions like skin rash, itching or hives, swelling of the face, lips, or tongue  anxious  black, tarry stools  breathing problems  changes in vision  chest pain  confusion  elevated mood, decreased need for sleep, racing thoughts, impulsive behavior  eye pain  fast, irregular heartbeat  feeling faint or lightheaded, falls  feeling agitated, angry, or irritable  hallucination, loss of contact with reality  loss of balance or coordination  loss of memory  painful or prolonged  erections  restlessness, pacing, inability to keep still  seizures  stiff muscles  suicidal thoughts or other mood changes  trouble sleeping  unusual bleeding or bruising  unusually weak or tired  vomiting Side effects that usually do not require medical attention (report to your doctor or health care professional if they continue or are bothersome):  change in appetite or weight  change in sex drive or performance  dizziness  headache  increased sweating  indigestion, nausea  tremors This list Siragusa not describe all possible side effects. Call your doctor for medical advice about side effects. You Sprenkle report side effects to FDA at 1-800-FDA-1088. Where should I keep my medicine? Keep out of reach of children. Store at room temperature between 15 and 30 degrees C (59 and 86 degrees F). Throw away any unused medicine after the expiration date. NOTE: This sheet is a summary. It Mcmurtry not cover all possible information. If you have questions about this medicine, talk to your doctor, pharmacist, or health care provider.  2020 Elsevier/Gold Standard (2018-04-27 09:05:36) Quetiapine tablets What is this medicine? QUETIAPINE (kwe TYE a peen) is an antipsychotic. It is used to treat schizophrenia and bipolar disorder, also known as manic-depression. This medicine Ripp be used for other purposes; ask your health care provider or pharmacist if you have questions. COMMON BRAND NAME(S): Seroquel What should I tell my health care provider before I take this medicine? They need to know if you have any of these conditions:  blockage in your bowel  cataracts  constipation  dehydration  diabetes  difficulty swallowing  glaucoma  heart disease  history of breast cancer  kidney disease  liver disease  low blood counts, like low white cell, platelet, or red cell counts  low blood pressure or dizziness when standing up  Parkinson's disease  previous heart  attack  prostate disease  seizures  stomach or intestine problems  suicidal thoughts, plans or attempt; a previous suicide attempt by you or a family member  thyroid disease  trouble passing urine  an unusual or allergic reaction to quetiapine, other medicines, foods, dyes, or preservatives  pregnant or trying to get pregnant  breast-feeding How should I use this medicine? Take this medicine by mouth. Swallow it with a drink of water. Follow the directions on the prescription label. If it upsets your stomach you can take it with food. Take your medicine at regular intervals. Do not take it more often than directed. Do not stop taking except on the advice of your doctor or health care professional. A special MedGuide will be given to you by the pharmacist with each prescription and refill. Be sure to read this information carefully each time. Talk to your pediatrician regarding the use of this medicine in children. While this drug Coutts be prescribed for children as young as 10 years for selected conditions, precautions do apply. Patients over age 50 years Gripp have a stronger reaction to this medicine and need smaller doses. Overdosage: If you think you have taken too much of this medicine contact a poison control center or emergency room at once. NOTE: This medicine is only for you. Do not share this medicine with others. What if I miss a dose? If you miss a dose, take it as soon as you can. If it is almost time for your next dose, take only that dose. Do not take double or extra doses. What Heupel interact with this medicine? Do not take this medicine with any of the following medications:  cisapride  dronedarone  fluconazole  metoclopramide  pimozide  posaconazole  thioridazine This medicine Wiehe also interact with the following medications:  alcohol  antihistamines for allergy cough and cold  antiviral medicines for HIV or AIDS  atropine  certain medicines for  bladder problems like oxybutynin, tolterodine  certain medicines for blood pressure  certain medicines for depression, anxiety, or psychotic disturbances  certain medicines for diabetes  certain medicines for stomach problems like dicyclomine, hyoscyamine  certain medicines for travel sickness like scopolamine  certain medicines for Parkinson's disease  certain medicines for seizures like carbamazepine, phenobarbital, phenytoin  cimetidine  erythromycin  ipratropium  other medicines that prolong the QT interval (cause an abnormal heart rhythm) like dofetilide  rifampin  steroid medicines like prednisone or cortisone This list Maravilla not describe all possible interactions. Give your health care provider a list of all the medicines, herbs, non-prescription drugs, or dietary supplements you use. Also tell them if you smoke, drink alcohol, or use illegal drugs. Some items Jock interact with your medicine. What should I watch for while using this medicine? Visit your health care professional for regular checks on your progress. Tell your health care professional if symptoms do not start to get better or if they get worse. Do not stop taking except on your health care professional's advice. You Kinoshita develop a severe reaction. Your health care professional will tell you how much medicine to take. You Barberi need to have an eye exam before and during use of this medicine.  This medicine Stauch increase blood sugar. Ask your health care provider if changes in diet or medicines are needed if you have diabetes. Patients and their families should watch out for new or worsening depression or thoughts of suicide. Also watch out for sudden or severe changes in feelings such as feeling anxious, agitated, panicky, irritable, hostile, aggressive, impulsive, severely restless, overly excited and hyperactive, or not being able to sleep. If this happens, especially at the beginning of antidepressant treatment or  after a change in dose, call your health care professional. Bonita Quin Hinchey get dizzy or drowsy. Do not drive, use machinery, or do anything that needs mental alertness until you know how this medicine affects you. Do not stand or sit up quickly, especially if you are an older patient. This reduces the risk of dizzy or fainting spells. Alcohol Leifheit interfere with the effect of this medicine. Avoid alcoholic drinks. This drug can cause problems with controlling your body temperature. It can lower the response of your body to cold temperatures. If possible, stay indoors during cold weather. If you must go outdoors, wear warm clothes. It can also lower the response of your body to heat. Do not overheat. Do not over-exercise. Stay out of the sun when possible. If you must be in the sun, wear cool clothing. Drink plenty of water. If you have trouble controlling your body temperature, call your health care provider right away. What side effects Fedak I notice from receiving this medicine? Side effects that you should report to your doctor or health care professional as soon as possible:  allergic reactions like skin rash, itching or hives, swelling of the face, lips, or tongue  breathing problems  changes in vision  confusion  elevated mood, decreased need for sleep, racing thoughts, impulsive behavior  eye pain  fast, irregular heartbeat  fever or chills, sore throat  inability to keep still  males: prolonged or painful erection  problems with balance, talking, walking  redness, blistering, peeling, or loosening of the skin, including inside the mouth  seizures  signs and symptoms of high blood sugar such as being more thirsty or hungry or having to urinate more than normal. You Waddill also feel very tired or have blurry vision  signs and symptoms of hypothyroidism like fatigue; increased sensitivity to cold; weight gain; hoarseness; thinning hair  signs and symptoms of low blood pressure like  dizziness; feeling faint or lightheaded; falls; unusually weak or tired  signs and symptoms of neuroleptic malignant syndrome like confusion; fast, irregular heartbeat; high fever; increased sweating; stiff muscles  sudden numbness or weakness of the face, arm, or leg  suicidal thoughts or other mood changes  trouble swallowing  uncontrollable movements of the arms, face, head, mouth, neck, or upper body Side effects that usually do not require medical attention (report to your doctor or health care professional if they continue or are bothersome):  change in sex drive or performance  constipation  drowsiness  dry mouth  upset stomach  weight gain This list Wyer not describe all possible side effects. Call your doctor for medical advice about side effects. You Hurlbutt report side effects to FDA at 1-800-FDA-1088. Where should I keep my medicine? Keep out of the reach of children. Store at room temperature between 15 and 30 degrees C (59 and 86 degrees F). Throw away any unused medicine after the expiration date. NOTE: This sheet is a summary. It Tomko not cover all possible information. If you have questions about this medicine,  talk to your doctor, pharmacist, or health care provider.  2020 Elsevier/Gold Standard (2019-03-03 11:59:11) Tardive Dyskinesia Tardive dyskinesia is a disorder that causes uncontrollable body movements. It occurs in some people who are taking certain medicines to treat a mental illness (neuroleptic medicine) or have taken this type of medicine in the past. These medicines block the effects of a specific brain chemical (dopamine). Sometimes, tardive dyskinesia starts months or years after someone took the medicine. Not everyone who takes a neuroleptic medicine will get tardive dyskinesia. What are the causes? This condition is caused by changes in your brain that are associated with taking a neuroleptic medicine. What increases the risk? If you are taking a  neuroleptic medicine, your risk for tardive dyskinesia Sunderlin be higher if you:  Are taking an older type of neuroleptic medicine.  Have been taking the medicine for a long time at a high dose.  Are a woman past the age of menopause.  Are older than 60.  Have a history of alcohol or drug abuse. What are the signs or symptoms? Abnormal, uncontrollable movements are the main symptom of tardive dyskinesia. These types of movements Merriweather include:  Grimacing.  Sticking out or twisting your tongue.  Making chewing or sucking sounds.  Making grunting or sighing noises.  Blinking your eyes.  Twisting, swaying, or thrusting your body.  Foot tapping or finger waving.  Rapid movements of your arms or legs. How is this diagnosed? Your health care provider Haseman suspect that you have tardive dyskinesia if:  You have been taking neuroleptic medicines.  You have abnormal movements that you cannot control. If you are taking a medicine that can cause tardive dyskinesia, your health care provider Blish screen you for early signs of the condition. This Darrington include:  Observing your body movements.  Using a specific rating scale called the Abnormal Involuntary Movement Scale (AIMS). You Harbold also have tests to rule out other conditions that cause abnormal body movements, including:  Parkinson's disease.  Huntington's disease.  Stroke. How is this treated? The best treatment for tardive dyskinesia is to lower the dose of your medicine or to switch to a different medicine at the first sign of abnormal and uncontrolled movements. There is no cure for long-term (chronic) tardive dyskinesia. Some medicines Paden help control the movements. These include:  Clozapine, a medicine used to treat mental illness (antipsychotic).  Some muscle relaxants.  Some anti-seizure medicines.  Some medicines used to treat high blood pressure.  Some tranquilizers (sedatives). Follow these instructions at home:       Take over-the-counter and prescription medicines only as told by your health care provider. Do not stop or start taking any medicines without talking to your health care provider first.  Do not abuse drugs or alcohol.  Keep all follow-up visits as told by your health care provider. This is important. Contact a health care provider if:  You are unable to eat or drink.  You have had a fall.  Your symptoms get worse. Summary  Tardive dyskinesia is a disorder that causes uncontrollable body movements. These Liberman include grimacing, sticking out or twisting your tongue, blinking your eyes, or rapid movements of your arms or legs.  The condition occurs in some people who are taking certain medicines to treat a mental illness (neuroleptic medicine) or have taken this type of medicine in the past.  The best treatment for tardive dyskinesia is to lower the dose of your medicine or to switch to a different medicine  at the first sign of abnormal and uncontrolled movements.  There is no cure for long-term (chronic) tardive dyskinesia, but some medicines Henneman help control the movements. This information is not intended to replace advice given to you by your health care provider. Make sure you discuss any questions you have with your health care provider. Document Revised: 05/29/2017 Document Reviewed: 05/29/2017 Elsevier Patient Education  2020 [employer].

## 2020-04-03 DIAGNOSIS — I10 Essential (primary) hypertension: Secondary | ICD-10-CM | POA: Diagnosis not present

## 2020-04-03 DIAGNOSIS — M5440 Lumbago with sciatica, unspecified side: Secondary | ICD-10-CM | POA: Diagnosis not present

## 2020-04-03 DIAGNOSIS — E669 Obesity, unspecified: Secondary | ICD-10-CM | POA: Diagnosis not present

## 2020-04-03 DIAGNOSIS — J449 Chronic obstructive pulmonary disease, unspecified: Secondary | ICD-10-CM | POA: Diagnosis not present

## 2020-04-03 DIAGNOSIS — E782 Mixed hyperlipidemia: Secondary | ICD-10-CM | POA: Diagnosis not present

## 2020-04-04 ENCOUNTER — Ambulatory Visit
Admission: RE | Admit: 2020-04-04 | Discharge: 2020-04-04 | Disposition: A | Discharge status: Home / Self Care | Payer: Medicaid Other | Source: Ambulatory Visit | Attending: Family | Admitting: Family

## 2020-04-04 ENCOUNTER — Other Ambulatory Visit: Payer: Self-pay

## 2020-04-04 DIAGNOSIS — N6320 Unspecified lump in the left breast, unspecified quadrant: Secondary | ICD-10-CM

## 2020-04-04 DIAGNOSIS — N644 Mastodynia: Secondary | ICD-10-CM

## 2020-04-04 DIAGNOSIS — R928 Other abnormal and inconclusive findings on diagnostic imaging of breast: Secondary | ICD-10-CM | POA: Diagnosis not present

## 2020-04-27 ENCOUNTER — Telehealth: Payer: Self-pay

## 2020-04-27 NOTE — Telephone Encounter (Signed)
Returned call to patient, left voicemail since patient was not available.

## 2020-04-27 NOTE — Telephone Encounter (Signed)
pt called states that she wants to go back on the abilify.

## 2020-04-28 ENCOUNTER — Other Ambulatory Visit: Payer: Self-pay | Admitting: Psychiatry

## 2020-04-28 ENCOUNTER — Telehealth: Payer: Self-pay | Admitting: Psychiatry

## 2020-04-28 DIAGNOSIS — F411 Generalized anxiety disorder: Secondary | ICD-10-CM

## 2020-04-28 DIAGNOSIS — F3162 Bipolar disorder, current episode mixed, moderate: Secondary | ICD-10-CM

## 2020-04-28 DIAGNOSIS — F1220 Cannabis dependence, uncomplicated: Secondary | ICD-10-CM

## 2020-04-28 DIAGNOSIS — F410 Panic disorder [episodic paroxysmal anxiety] without agoraphobia: Secondary | ICD-10-CM

## 2020-04-28 MED ORDER — ARIPIPRAZOLE 20 MG PO TABS: 20.0000 mg | ORAL_TABLET | Freq: Every day | ORAL | 1 refills | Status: DC

## 2020-04-28 NOTE — Telephone Encounter (Signed)
Spoke to patient.  Patient reports she does not want to be on Seroquel since it is giving her side effects, she is not sleeping, she is having a headache.  She has already stopped the Seroquel.  She wants to go back on the Abilify.  We will restart Abilify 20 mg p.o. daily  She reports she is currently on a lower dosage of venlafaxine however is not sure if she was able to start the Celexa or not.  She will check her medications and let writer know.  Discussed with patient to go to the nearest emergency department or call her primary care provider if she is having a lot of headaches, urinary frequency and so on which she reports she has been struggling with since the past few days.

## 2020-05-01 ENCOUNTER — Other Ambulatory Visit: Payer: Self-pay | Admitting: Psychiatry

## 2020-05-01 ENCOUNTER — Telehealth (INDEPENDENT_AMBULATORY_CARE_PROVIDER_SITE_OTHER): Payer: Medicaid Other | Admitting: Psychiatry

## 2020-05-01 ENCOUNTER — Other Ambulatory Visit: Payer: Self-pay

## 2020-05-01 ENCOUNTER — Encounter: Payer: Self-pay | Admitting: Psychiatry

## 2020-05-01 DIAGNOSIS — F411 Generalized anxiety disorder: Secondary | ICD-10-CM

## 2020-05-01 DIAGNOSIS — F410 Panic disorder [episodic paroxysmal anxiety] without agoraphobia: Secondary | ICD-10-CM | POA: Diagnosis not present

## 2020-05-01 DIAGNOSIS — Z91199 Patient's noncompliance with other medical treatment and regimen due to unspecified reason: Secondary | ICD-10-CM

## 2020-05-01 DIAGNOSIS — F1220 Cannabis dependence, uncomplicated: Secondary | ICD-10-CM | POA: Diagnosis not present

## 2020-05-01 DIAGNOSIS — F3162 Bipolar disorder, current episode mixed, moderate: Secondary | ICD-10-CM

## 2020-05-01 DIAGNOSIS — Z9119 Patient's noncompliance with other medical treatment and regimen: Secondary | ICD-10-CM

## 2020-05-01 DIAGNOSIS — F17200 Nicotine dependence, unspecified, uncomplicated: Secondary | ICD-10-CM

## 2020-05-01 HISTORY — DX: Patient's noncompliance with other medical treatment and regimen due to unspecified reason: Z91.199

## 2020-05-01 MED ORDER — TRAZODONE HCL 100 MG PO TABS: 200.0000 mg | ORAL_TABLET | Freq: Every evening | ORAL | 0 refills | Status: DC | PRN

## 2020-05-01 MED ORDER — VENLAFAXINE HCL ER 150 MG PO CP24: 150.0000 mg | ORAL_CAPSULE | Freq: Every day | ORAL | 1 refills | Status: DC

## 2020-05-01 NOTE — Progress Notes (Signed)
Virtual Visit via Telephone Note  I connected with Shelly Wright on 05/01/20 at 11:00 AM EST by telephone and verified that I am speaking with the correct person using two identifiers.  Location Provider Location : [facility] Patient Location : [facility]  Participants: Patient , Provider   I discussed the limitations, risks, security and privacy concerns of performing an evaluation and management service by telephone and the availability of in person appointments. I also discussed with the patient that there Autry be a patient responsible charge related to this service. The patient expressed understanding and agreed to proceed.   I discussed the assessment and treatment plan with the patient. The patient was provided an opportunity to ask questions and all were answered. The patient agreed with the plan and demonstrated an understanding of the instructions.   The patient was advised to call back or seek an in-person evaluation if the symptoms worsen or if the condition fails to improve as anticipated.   BH MD OP Progress Note  05/01/2020 1:56 PM Shelly Wright  MRN:  [id number]  Chief Complaint:  Chief Complaint    Follow-up     HPI: Shelly Wright is a 49 year old Caucasian female on SSI, lives in [place], has a history of bipolar disorder, panic attacks, GAD, cannabis use disorder, tobacco use disorder was evaluated by phone today.  Patient today reports she did not start the Celexa as discussed last visit.  She reports she continued the venlafaxine 37.5 mg which was being tapered off since the Celexa was started.  She reports she is currently back on the Abilify since she did not like the effect of Seroquel.  She reports she continues to struggle with reduced appetite which she thinks Shovlin have started after she took the Seroquel.  She however reports since she is back on the Abilify she was able to eat some this morning.  She reports she wants to just stay on all her medications that  she was taking previously and does not want to make further changes.  She is not interested in tapering off the venlafaxine.  She is not interested in starting the Celexa.  She does report having difficulty falling asleep on the trazodone.  She currently takes trazodone 200 mg at bedtime.  She reports she however is not interested in changing her trazodone to something new at this point and wants to stay wherever she is.  She continues to have situational stressors of housing problems.  She however is not interested in starting psychotherapy sessions again.  She reports she uses cannabis however has been trying to cut back.  She currently uses it a few times a week.  Patient denies any suicidality, homicidality or perceptual disturbances.  Patient denies any other concerns today.  Visit Diagnosis:    ICD-10-CM   1. Bipolar 1 disorder, mixed, moderate (HCC)  F31.62   2. GAD (generalized anxiety disorder)  F41.1 venlafaxine XR (EFFEXOR XR) 150 MG 24 hr capsule    traZODone (DESYREL) 100 MG tablet  3. Panic attacks  F41.0   4. Cannabis use disorder, moderate, dependence (HCC)  F12.20   5. Tobacco use disorder  F17.200   6. Noncompliance with treatment regimen  Z91.19     Past Psychiatric History: I have reviewed past psychiatric history from my progress note on 09/16/2018.  Past trials of venlafaxine, trazodone, Seroquel, hydroxyzine, Zoloft, Lexapro, duloxetine, Paxil  Past Medical History:  Past Medical History:  Diagnosis Date  . Acute right-sided low  back pain with right-sided sciatica 08/30/2014  . Anxiety   . Arthritis   . Asthma    in past, no current inhalers  . Bipolar disorder (HCC)   . Cervical spinal cord compression (HCC) 05/07/2012  . COPD (chronic obstructive pulmonary disease) (HCC)   . DDD (degenerative disc disease), lumbar   . Depression   . Dyspnea    with exertion  . Dysrhythmia    irregular-h/o in the past  . Fibromyalgia   . Hypertension   . Neuritis or  radiculitis due to rupture of lumbar intervertebral disc 05/23/2014  . Periprosthetic fracture around prosthetic knee 07/31/2015  . Restless leg syndrome   . Soft tissue lesion of shoulder region 01/24/2012  . 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 Syncope and collapse 04/08/2014  . Thoracic and lumbosacral neuritis 05/07/2012  . Thoracic neuritis 05/07/2012    Past Surgical History:  Procedure Laterality Date  . ABDOMINAL HYSTERECTOMY    . Cervical Fusion X 2    . JOINT REPLACEMENT Right 01/2016   Dr Gavin Potters  . KNEE ARTHROPLASTY Left 02/10/2019   Procedure: COMPUTER ASSISTED TOTAL KNEE ARTHROPLASTY - RNFA;  Surgeon: Donato Heinz, MD;  Location: [facility] ORS;  Service: Orthopedics;  Laterality: Left;  . KNEE ARTHROSCOPY Bilateral    Right knee scope 1998, Left knee scope  . TONSILLECTOMY    . TOTAL KNEE ARTHROPLASTY Right 02/01/2015   Procedure: TOTAL KNEE ARTHROPLASTY;  Surgeon: Erin Sons, MD;  Location: [facility] ORS;  Service: Orthopedics;  Laterality: Right;  . TUBAL LIGATION      Family Psychiatric History: I have reviewed family psychiatric history from my progress note on 09/16/2018  Family History:  Family History  Problem Relation Age of Onset  . Breast cancer Mother 78  . Colon cancer Father   . Heart attack Father   . Bipolar disorder Sister   . Depression Sister   . Schizophrenia Sister     Social History: Reviewed social history from my progress note on 09/16/2018 Social History   Socioeconomic History  . Marital status: Legally Separated    Spouse name: Not on file  . Number of children: 1  . Years of education: Not on file  . Highest education level: High school graduate  Occupational History  . Not on file  Tobacco Use  . Smoking status: Current Every Day Smoker    Packs/day: 1.00    Years: 30.00    Pack years: 30.00    Types: Cigarettes  . Smokeless tobacco: Never Used   Vaping Use  . Vaping Use: Never used  Substance and Sexual Activity  . Alcohol use: No  . Drug use: Yes    Types: Marijuana    Comment: every day  . Sexual activity: Yes  Other Topics Concern  . Not on file  Social History Narrative  . Not on file   Social Determinants of Health   Financial Resource Strain: Not on file  Food Insecurity: Not on file  Transportation Needs: Not on file  Physical Activity: Not on file  Stress: Not on file  Social Connections: Not on file    Allergies: No Known Allergies  Metabolic Disorder Labs: No results found for: HGBA1C, MPG No results found for: PROLACTIN No results found for: CHOL, TRIG, HDL, CHOLHDL, VLDL, LDLCALC No results found for: TSH  Therapeutic Level Labs: No results found for: LITHIUM No results found for: VALPROATE  No components found for:  CBMZ  Current Medications: Current Outpatient Medications  Medication Sig Dispense Refill  . albuterol (VENTOLIN HFA) 108 (90 Base) MCG/ACT inhaler Inhale 2 puffs into the lungs 4 (four) times daily.     . ARIPiprazole (ABILIFY) 20 MG tablet TAKE ONE TABLET BY MOUTH DAILY 90 tablet 0  . busPIRone (BUSPAR) 30 MG tablet Take 1 tablet (30 mg total) by mouth 2 (two) times daily. 180 tablet 1  . celecoxib (CELEBREX) 200 MG capsule Take 1 capsule (200 mg total) by mouth 2 (two) times daily. (Patient taking differently: Take 200 mg by mouth daily. ) 60 capsule 1  . enoxaparin (LOVENOX) 40 MG/0.4ML injection Inject 0.4 mLs (40 mg total) into the skin daily for 14 doses. 5.6 mL 0  . gabapentin (NEURONTIN) 800 MG tablet Take 1 tablet (800 mg total) by mouth every 6 (six) hours. 120 tablet 5  . hydrochlorothiazide (HYDRODIURIL) 25 MG tablet Take 25 mg by mouth every morning.   1  . lidocaine (XYLOCAINE) 5 % ointment Apply 1 application topically 4 (four) times daily as needed for moderate pain. (1application= 5 g, or 6 inches of ointment) Max: 20 g/day 50 g PRN  . methocarbamol (ROBAXIN) 750 MG  tablet Take 1 tablet (750 mg total) by mouth 3 (three) times daily. Max: 3/day 90 tablet 5  . oxcarbazepine (TRILEPTAL) 600 MG tablet Take 1 tablet (600 mg total) by mouth 2 (two) times daily. For mood 180 tablet 0  . simvastatin (ZOCOR) 20 MG tablet Take 20 mg by mouth at bedtime.     Marland Kitchen tiotropium (SPIRIVA HANDIHALER) 18 MCG inhalation capsule Place 18 mcg into inhaler and inhale every morning.     . traZODone (DESYREL) 100 MG tablet Take 2 tablets (200 mg total) by mouth at bedtime as needed for sleep. 180 tablet 0  . venlafaxine XR (EFFEXOR XR) 150 MG 24 hr capsule Take 1 capsule (150 mg total) by mouth daily with breakfast. 90 capsule 1   No current facility-administered medications for this visit.     Musculoskeletal: Strength & Muscle Tone: UTA Gait & Station: UTA Patient leans: N/A  Psychiatric Specialty Exam: Review of Systems  Constitutional: Positive for appetite change.  Psychiatric/Behavioral: The patient is nervous/anxious.   All other systems reviewed and are negative.   There were no vitals taken for this visit.There is no height or weight on file to calculate BMI.  General Appearance: UTA  Eye Contact:  UTA  Speech:  Clear and Coherent  Volume:  Normal  Mood:  Anxious  Affect:  UTA  Thought Process:  Goal Directed and Descriptions of Associations: Intact  Orientation:  Full (Time, Place, and Person)  Thought Content: Logical   Suicidal Thoughts:  No  Homicidal Thoughts:  No  Memory:  Immediate;   Fair Recent;   Fair Remote;   Fair  Judgement:  Fair  Insight:  Fair  Psychomotor Activity:  UTA  Concentration:  Concentration: Fair and Attention Span: Fair  Recall:  [employer] of Knowledge: Fair  Language: Fair  Akathisia:  No  Handed:  Right  AIMS (if indicated): UTA  Assets:  Chief of Staff  ADL's:  Intact  Cognition: WNL  Sleep:  Fair   Screenings: PHQ2-9   Flowsheet Row Office Visit from 11/30/2019 in [facility] Clinical Support from 08/26/2017 in [facility] Office Visit from 09/11/2016 in [facility] Procedure visit from 08/14/2016 in [facility] Office Visit from 07/09/2016 in [facility]  PHQ-2 Total Score 0 0 0 0 0       Assessment and Plan: Sopheap Basic Sellin is a 50 year old Caucasian female on SSI, lives in [place], has a history of bipolar disorder, tobacco use disorder, COPD was evaluated by phone today.  Patient is biologically predisposed given her family history of mental health problems, substance abuse problems.  Patient with psychosocial stressors of housing problems, legal problems, history of chronic pain and pandemic.  Patient continues to struggle with anxiety although progressing.  Plan as noted below.  Plan Bipolar disorder-unstable Abilify was restarted recently, 20 mg p.o. daily.  We will continue the same. Trileptal as prescribed Trazodone 200 mg p.o. nightly as needed  GAD-unstable Patient is noncompliant on Celexa.  Will discontinue Celexa. Patient wants to go back on venlafaxine.  Will restart venlafaxine extended release, previous dosage of 150 mg p.o. daily BuSpar 30 mg p.o. twice daily Propanolol 10 mg p.o. 3 times daily as needed Trileptal 600 mg p.o. twice daily Discussed psychotherapy sessions, she declines.  Cannabis use disorder moderate-unstable Encouraged to cut back. Provided counseling  Tobacco use disorder-unstable We will monitor closely, she is not ready to quit.  Noncompliance with treatment plan- Patient has been noncompliant with therapy recommendations, treatment-medication recommendations.  Provided medication education.  Patient was started back on her previous dosage of Abilify and venlafaxine since she is not agreeable to changing  her medications.  We will monitor closely.  Patient is aware about medication side effects including tardive dyskinesia, metabolic syndrome.  Patient currently reports she has reduced appetite due to being on Seroquel which she stopped more than a week ago.  Discussed with patient to contact her primary care provider if she continues to have reduced appetite and other physical symptoms.  She agrees with plan.  Follow-up in clinic in 4 weeks or sooner if needed.  I have spent atleast 30 minutes non face to face with patient today. More than 50 % of the time was spent for preparing to see the patient ( e.g., review of test, records ),  ordering medications and test ,psychoeducation and supportive psychotherapy and care coordination,as well as documenting clinical information in electronic health record. This note was generated in part or whole with voice recognition software. Voice recognition is usually quite accurate but there are transcription errors that can and very often do occur. I apologize for any typographical errors that were not detected and corrected.        Jomarie Longs, MD 05/01/2020, 1:56 PM

## 2020-05-05 ENCOUNTER — Telehealth: Payer: Self-pay | Admitting: Pain Medicine

## 2020-05-08 NOTE — Telephone Encounter (Signed)
Error

## 2020-05-22 ENCOUNTER — Telehealth: Payer: Self-pay | Admitting: *Deleted

## 2020-05-22 NOTE — Telephone Encounter (Signed)
Patient called and wants Seroquel script.  She sait it was because it helped with her smoking.  Looks likte it was discontinued.  Please review.

## 2020-05-22 NOTE — Telephone Encounter (Signed)
Please let patient know she cannot be started back on seroquel due to interaction with current medications. Please advise her to schedule a sooner appointment if she needs further medication management.

## 2020-05-22 NOTE — Telephone Encounter (Signed)
I will do it.

## 2020-05-29 ENCOUNTER — Other Ambulatory Visit: Payer: Self-pay

## 2020-05-29 ENCOUNTER — Telehealth (INDEPENDENT_AMBULATORY_CARE_PROVIDER_SITE_OTHER): Payer: Medicaid Other | Admitting: Psychiatry

## 2020-05-29 ENCOUNTER — Encounter: Payer: Self-pay | Admitting: Psychiatry

## 2020-05-29 DIAGNOSIS — Z91199 Patient's noncompliance with other medical treatment and regimen due to unspecified reason: Secondary | ICD-10-CM

## 2020-05-29 DIAGNOSIS — F17200 Nicotine dependence, unspecified, uncomplicated: Secondary | ICD-10-CM

## 2020-05-29 DIAGNOSIS — F1220 Cannabis dependence, uncomplicated: Secondary | ICD-10-CM

## 2020-05-29 DIAGNOSIS — F410 Panic disorder [episodic paroxysmal anxiety] without agoraphobia: Secondary | ICD-10-CM

## 2020-05-29 DIAGNOSIS — F411 Generalized anxiety disorder: Secondary | ICD-10-CM

## 2020-05-29 DIAGNOSIS — Z9119 Patient's noncompliance with other medical treatment and regimen: Secondary | ICD-10-CM

## 2020-05-29 DIAGNOSIS — F3162 Bipolar disorder, current episode mixed, moderate: Secondary | ICD-10-CM

## 2020-05-29 MED ORDER — VENLAFAXINE HCL ER 37.5 MG PO CP24: 37.5000 mg | ORAL_CAPSULE | Freq: Every day | ORAL | 0 refills | Status: DC

## 2020-05-29 MED ORDER — NICOTINE 14 MG/24HR TD PT24: 14.0000 mg | MEDICATED_PATCH | Freq: Every day | TRANSDERMAL | 2 refills | Status: DC

## 2020-05-29 MED ORDER — VENLAFAXINE HCL ER 150 MG PO CP24: 150.0000 mg | ORAL_CAPSULE | Freq: Every day | ORAL | 1 refills | Status: DC

## 2020-05-29 NOTE — Progress Notes (Signed)
Virtual Visit via Telephone Note  I connected with Shelly Wright on 05/29/20 at  3:20 PM EST by telephone and verified that I am speaking with the correct person using two identifiers.  Location Provider Location : [facility] Patient Location : [place]  Participants: Patient , Provider   I discussed the limitations, risks, security and privacy concerns of performing an evaluation and management service by telephone and the availability of in person appointments. I also discussed with the patient that there Dowen be a patient responsible charge related to this service. The patient expressed understanding and agreed to proceed.    I discussed the assessment and treatment plan with the patient. The patient was provided an opportunity to ask questions and all were answered. The patient agreed with the plan and demonstrated an understanding of the instructions.   The patient was advised to call back or seek an in-person evaluation if the symptoms worsen or if the condition fails to improve as anticipated.  BH MD OP Progress Note  05/29/2020 6:20 PM Shelly Wright  MRN:  [id number]  Chief Complaint:  Chief Complaint    Follow-up     HPI: Shelly Wright is a 50 year old Caucasian female on SSI, has a history of bipolar disorder, panic attacks, GAD, cannabis use disorder, tobacco use disorder, married, was evaluated by telemedicine today.  Patient today reports she had her court hearing recently and she was asked to evict the house.  She reports she does not have a place to move to and that does make her anxious and worried.  She reports mood wise she is not doing well.  She struggles with a lot of anxiety, restlessness.  She also reports racing thoughts, rumination.  She continues to smoke cannabis.  She also smokes cigarettes.  She is interested in smoking cessation.  She has tried and failed Chantix in the past.  She is not interested in nicotine gums.  She reports she is willing to try  nicotine patches.  She has been noncompliant with psychotherapy sessions in the past and is not agreeable to restarting therapy.  Patient denies any suicidality, homicidality or perceptual disturbances.  Patient denies any other concerns today.  Visit Diagnosis:    ICD-10-CM   1. Bipolar 1 disorder, mixed, moderate (HCC)  F31.62   2. GAD (generalized anxiety disorder)  F41.1 venlafaxine XR (EFFEXOR XR) 150 MG 24 hr capsule    venlafaxine XR (EFFEXOR-XR) 37.5 MG 24 hr capsule  3. Panic attacks  F41.0   4. Cannabis use disorder, moderate, dependence (HCC)  F12.20   5. Tobacco use disorder  F17.200 nicotine (NICODERM CQ) 14 mg/24hr patch  6. Noncompliance with treatment regimen  Z91.19     Past Psychiatric History: I have reviewed past psychiatric history from my progress note on 09/16/2018.  Past also venlafaxine, trazodone, Seroquel, hydroxyzine, Zoloft, Lexapro, duloxetine, Paxil  Past Medical History:  Past Medical History:  Diagnosis Date  . Acute right-sided low back pain with right-sided sciatica 08/30/2014  . Anxiety   . Arthritis   . Asthma    in past, no current inhalers  . Bipolar disorder (HCC)   . Cervical spinal cord compression (HCC) 05/07/2012  . COPD (chronic obstructive pulmonary disease) (HCC)   . DDD (degenerative disc disease), lumbar   . Depression   . Dyspnea    with exertion  . Dysrhythmia    irregular-h/o in the past  . Fibromyalgia   . Hypertension   . Neuritis or radiculitis due  to rupture of lumbar intervertebral disc 05/23/2014  . Periprosthetic fracture around prosthetic knee 07/31/2015  . Restless leg syndrome   . Soft tissue lesion of shoulder region 01/24/2012  . 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 Syncope and collapse 04/08/2014  . Thoracic and lumbosacral neuritis 05/07/2012  . Thoracic neuritis 05/07/2012    Past Surgical History:  Procedure Laterality Date  .  ABDOMINAL HYSTERECTOMY    . Cervical Fusion X 2    . JOINT REPLACEMENT Right 01/2016   Dr Gavin Potters  . KNEE ARTHROPLASTY Left 02/10/2019   Procedure: COMPUTER ASSISTED TOTAL KNEE ARTHROPLASTY - RNFA;  Surgeon: Donato Heinz, MD;  Location: [facility];  Service: Orthopedics;  Laterality: Left;  . KNEE ARTHROSCOPY Bilateral    Right knee scope 1998, Left knee scope  . TONSILLECTOMY    . TOTAL KNEE ARTHROPLASTY Right 02/01/2015   Procedure: TOTAL KNEE ARTHROPLASTY;  Surgeon: Erin Sons, MD;  Location: [facility];  Service: Orthopedics;  Laterality: Right;  . TUBAL LIGATION      Family Psychiatric History: I have reviewed family psychiatric history from my progress note on 09/16/2018  Family History:  Family History  Problem Relation Age of Onset  . Breast cancer Mother 41  . Colon cancer Father   . Heart attack Father   . Bipolar disorder Sister   . Depression Sister   . Schizophrenia Sister     Social History: Reviewed social history from my progress note on 09/16/2018 Social History   Socioeconomic History  . Marital status: Legally Separated    Spouse name: Not on file  . Number of children: 1  . Years of education: Not on file  . Highest education level: High school graduate  Occupational History  . Not on file  Tobacco Use  . Smoking status: Current Every Day Smoker    Packs/day: 1.00    Years: 30.00    Pack years: 30.00    Types: Cigarettes  . Smokeless tobacco: Never Used  Vaping Use  . Vaping Use: Never used  Substance and Sexual Activity  . Alcohol use: No  . Drug use: Yes    Types: Marijuana    Comment: every day  . Sexual activity: Yes  Other Topics Concern  . Not on file  Social History Narrative  . Not on file   Social Determinants of Health   Financial Resource Strain: Not on file  Food Insecurity: Not on file  Transportation Needs: Not on file  Physical Activity: Not on file  Stress: Not on file  Social Connections: Not on file     Allergies: No Known Allergies  Metabolic Disorder Labs: No results found for: HGBA1C, MPG No results found for: PROLACTIN No results found for: CHOL, TRIG, HDL, CHOLHDL, VLDL, LDLCALC No results found for: TSH  Therapeutic Level Labs: No results found for: LITHIUM No results found for: VALPROATE No components found for:  CBMZ  Current Medications: Current Outpatient Medications  Medication Sig Dispense Refill  . nicotine (NICODERM CQ) 14 mg/24hr patch Place 1 patch (14 mg total) onto the skin daily. 28 patch 2  . venlafaxine XR (EFFEXOR-XR) 37.5 MG 24 hr capsule Take 1 capsule (37.5 mg total) by mouth daily with breakfast. Start taking along with 150 mg 90 capsule 0  . ADVAIR DISKUS 250-50 MCG/DOSE AEPB Inhale 1 puff into the lungs 2 (two) times daily.    Marland Kitchen albuterol (VENTOLIN HFA)  108 (90 Base) MCG/ACT inhaler Inhale 2 puffs into the lungs 4 (four) times daily.     Marland Kitchen amoxicillin (AMOXIL) 500 MG capsule Take 500 mg by mouth 3 (three) times daily.    . ARIPiprazole (ABILIFY) 20 MG tablet TAKE ONE TABLET BY MOUTH DAILY 90 tablet 0  . busPIRone (BUSPAR) 30 MG tablet Take 1 tablet (30 mg total) by mouth 2 (two) times daily. 180 tablet 1  . celecoxib (CELEBREX) 200 MG capsule Take 1 capsule (200 mg total) by mouth 2 (two) times daily. (Patient taking differently: Take 200 mg by mouth daily. ) 60 capsule 1  . enoxaparin (LOVENOX) 40 MG/0.4ML injection Inject 0.4 mLs (40 mg total) into the skin daily for 14 doses. 5.6 mL 0  . gabapentin (NEURONTIN) 800 MG tablet Take 1 tablet (800 mg total) by mouth every 6 (six) hours. 120 tablet 5  . hydrochlorothiazide (HYDRODIURIL) 25 MG tablet Take 25 mg by mouth every morning.   1  . lidocaine (LIDODERM) 5 %     . lidocaine (XYLOCAINE) 5 % ointment Apply 1 application topically 4 (four) times daily as needed for moderate pain. (1application= 5 g, or 6 inches of ointment) Max: 20 g/day 50 g PRN  . methocarbamol (ROBAXIN) 750 MG tablet Take 1 tablet  (750 mg total) by mouth 3 (three) times daily. Max: 3/day 90 tablet 5  . oxcarbazepine (TRILEPTAL) 600 MG tablet TAKE ONE TABLET TWICE DAILY FOR MOOD 180 tablet 0  . simvastatin (ZOCOR) 20 MG tablet Take 20 mg by mouth at bedtime.     Marland Kitchen tiotropium (SPIRIVA HANDIHALER) 18 MCG inhalation capsule Place 18 mcg into inhaler and inhale every morning.     . traZODone (DESYREL) 100 MG tablet Take 2 tablets (200 mg total) by mouth at bedtime as needed for sleep. 180 tablet 0  . venlafaxine XR (EFFEXOR XR) 150 MG 24 hr capsule Take 1 capsule (150 mg total) by mouth daily with breakfast. To be combined with 37.5 mg daily 90 capsule 1   No current facility-administered medications for this visit.     Musculoskeletal: Strength & Muscle Tone: UTA Gait & Station: UTA Patient leans: N/A  Psychiatric Specialty Exam: Review of Systems  Psychiatric/Behavioral: Positive for dysphoric mood. The patient is nervous/anxious.   All other systems reviewed and are negative.   There were no vitals taken for this visit.There is no height or weight on file to calculate BMI.  General Appearance: UTA  Eye Contact:  UTA  Speech:  Clear and Coherent  Volume:  Normal  Mood:  Anxious and Dysphoric  Affect:  UTA  Thought Process:  Goal Directed and Descriptions of Associations: Intact  Orientation:  Full (Time, Place, and Person)  Thought Content: Rumination   Suicidal Thoughts:  No  Homicidal Thoughts:  No  Memory:  Immediate;   Fair Recent;   Fair Remote;   Fair  Judgement:  Fair  Insight:  Fair  Psychomotor Activity:  UTA  Concentration:  Concentration: Fair and Attention Span: Fair  Recall:  [employer] of Knowledge: Fair  Language: Fair  Akathisia:  No  Handed:  Right  AIMS (if indicated): UTA  Assets:  Communication Skills Desire for Improvement Housing Social Support  ADL's:  Intact  Cognition: WNL  Sleep:  Fair   Screenings: PHQ2-9   Flowsheet Row Office Visit from 11/30/2019 in [place]  [facility] Clinical Support from 08/26/2017 in [facility] Office  [facility]  PHQ-2 Total Score 0 0 0 0 0       Assessment and Plan: Valentino SaxonValerie D Blitch is a 50 year old Caucasian female on SSI, has a history of bipolar disorder, tobacco use disorder, COPD was evaluated by telemedicine today.  Patient is biologically predisposed given her family history of mental health problems, substance abuse problems.  Patient with psychosocial stressors of legal problems, housing problems.  She continues to struggle with anxiety however has been noncompliant with treatment recommendation.  Discussed plan as noted below.  Plan Bipolar disorder- unstable Abilify 20 mg p.o. daily Trileptal as prescribed Trazodone 200 mg p.o. nightly as needed Increase venlafaxine extended release to 187.5 mg p.o. daily  GAD-unstable Increase venlafaxine extended release to 187.5 mg p.o. daily BuSpar 30 mg p.o. twice daily Propranolol 10 mg p.o. 3 times daily as needed Trileptal 600 mg p.o. twice daily Discussed therapy sessions in the past that she declines.  Cannabis use disorder moderate- unstable Provided counseling  Tobacco use disorder-unstable Provided counseling. We will start Nicotine patches 14 mg per 24-hour patch. Provided information for him sick with manage program.  Noncompliance with treatment plan- encouraged compliance  Follow up in clinic in 3 to 4 weeks or sooner if needed.  I have spent atleast 20 minutes non face to face with patient today. More than 50 % of the time was spent for preparing to see the patient ( e.g., review of test, records ),  ordering medications and test  ,psychoeducation and supportive psychotherapy and care coordination,as well as documenting clinical information in electronic health record. This note was generated in part or whole with voice recognition software. Voice recognition is usually quite accurate but there are transcription errors that can and very often do occur. I apologize for any typographical errors that were not detected and corrected.        Jomarie LongsSaramma Shameer Molstad, MD 05/29/2020, 6:20 PM

## 2020-05-29 NOTE — Patient Instructions (Signed)
Managing the Challenge of Quitting Smoking Quitting smoking is a physical and mental challenge. You will face cravings, withdrawal symptoms, and temptation. Before quitting, work with your health care provider to make a plan that can help you manage quitting. Preparation can help you quit and keep you from giving in. How to manage lifestyle changes Managing stress Stress can make you want to smoke, and wanting to smoke Vowels cause stress. It is important to find ways to manage your stress. You might try some of the following:  Practice relaxation techniques. ? Breathe slowly and deeply, in through your nose and out through your mouth. ? Listen to music. ? Soak in a bath or take a shower. ? Imagine a peaceful place or vacation.  Get some support. ? Talk with family or friends about your stress. ? Join a support group. ? Talk with a counselor or therapist.  Get some physical activity. ? Go for a walk, run, or bike ride. ? Play a favorite sport. ? Practice yoga.   Medicines Talk with your health care provider about medicines that might help you deal with cravings and make quitting easier for you. Relationships Social situations can be difficult when you are quitting smoking. To manage this, you can:  Avoid parties and other social situations where people might be smoking.  Avoid alcohol.  Leave right away if you have the urge to smoke.  Explain to your family and friends that you are quitting smoking. Ask for support and let them know you might be a bit grumpy.  Plan activities where smoking is not an option. General instructions Be aware that many people gain weight after they quit smoking. However, not everyone does. To keep from gaining weight, have a plan in place before you quit and stick to the plan after you quit. Your plan should include:  Having healthy snacks. When you have a craving, it Rolf help to: ? Eat popcorn, carrots, celery, or other cut vegetables. ? Chew  sugar-free gum.  Changing how you eat. ? Eat small portion sizes at meals. ? Eat 4-6 small meals throughout the day instead of 1-2 large meals a day. ? Be mindful when you eat. Do not watch television or do other things that might distract you as you eat.  Exercising regularly. ? Make time to exercise each day. If you do not have time for a long workout, do short bouts of exercise for 5-10 minutes several times a day. ? Do some form of strengthening exercise, such as weight lifting. ? Do some exercise that gets your heart beating and causes you to breathe deeply, such as walking fast, running, swimming, or biking. This is very important.  Drinking plenty of water or other low-calorie or no-calorie drinks. Drink 6-8 glasses of water daily.   How to recognize withdrawal symptoms Your body and mind Overbey experience discomfort as you try to get used to not having nicotine in your system. These effects are called withdrawal symptoms. They Fleites include:  Feeling hungrier than normal.  Having trouble concentrating.  Feeling irritable or restless.  Having trouble sleeping.  Feeling depressed.  Craving a cigarette. To manage withdrawal symptoms:  Avoid places, people, and activities that trigger your cravings.  Remember why you want to quit.  Get plenty of sleep.  Avoid coffee and other caffeinated drinks. These Wendt worsen some of your symptoms. These symptoms Hearst surprise you. But be assured that they are normal to have when quitting smoking. How to manage cravings   Come up with a plan for how to deal with your cravings. The plan should include the following:  A definition of the specific situation you want to deal with.  An alternative action you will take.  A clear idea for how this action will help.  The name of someone who might help you with this. Cravings usually last for 5-10 minutes. Consider taking the following actions to help you with your plan to deal with  cravings:  Keep your mouth busy. ? Chew sugar-free gum. ? Suck on hard candies or a straw. ? Brush your teeth.  Keep your hands and body busy. ? Change to a different activity right away. ? Squeeze or play with a ball. ? Do an activity or a hobby, such as making bead jewelry, practicing needlepoint, or working with wood. ? Mix up your normal routine. ? Take a short exercise break. Go for a quick walk or run up and down stairs.  Focus on doing something kind or helpful for someone else.  Call a friend or family member to talk during a craving.  Join a support group.  Contact a quitline. Where to find support To get help or find a support group:  Call the National Cancer Institute's Smoking Quitline: 1-800-QUIT NOW (784-8669)  Visit the website of the Substance Abuse and Mental Health Services Administration: www.samhsa.gov  Text QUIT to SmokefreeTXT: 478848 Where to find more information Visit these websites to find more information on quitting smoking:  National Cancer Institute: www.smokefree.gov  American Lung Association: www.lung.org  American Cancer Society: www.cancer.org  Centers for Disease Control and Prevention: www.cdc.gov  American Heart Association: www.heart.org Contact a health care provider if:  You want to change your plan for quitting.  The medicines you are taking are not helping.  Your eating feels out of control or you cannot sleep. Get help right away if:  You feel depressed or become very anxious. Summary  Quitting smoking is a physical and mental challenge. You will face cravings, withdrawal symptoms, and temptation to smoke again. Preparation can help you as you go through these challenges.  Try different techniques to manage stress, handle social situations, and prevent weight gain.  You can deal with cravings by keeping your mouth busy (such as by chewing gum), keeping your hands and body busy, calling family or friends, or  contacting a quitline for people who want to quit smoking.  You can deal with withdrawal symptoms by avoiding places where people smoke, getting plenty of rest, and avoiding drinks with caffeine. This information is not intended to replace advice given to you by your health care provider. Make sure you discuss any questions you have with your health care provider. Document Revised: 02/23/2019 Document Reviewed: 02/23/2019 Elsevier Patient Education  2021 [employer]  

## 2020-06-02 DIAGNOSIS — N3946 Mixed incontinence: Secondary | ICD-10-CM | POA: Diagnosis not present

## 2020-06-02 DIAGNOSIS — E669 Obesity, unspecified: Secondary | ICD-10-CM | POA: Diagnosis not present

## 2020-06-02 DIAGNOSIS — J449 Chronic obstructive pulmonary disease, unspecified: Secondary | ICD-10-CM | POA: Diagnosis not present

## 2020-06-02 DIAGNOSIS — E782 Mixed hyperlipidemia: Secondary | ICD-10-CM | POA: Diagnosis not present

## 2020-06-02 DIAGNOSIS — N3941 Urge incontinence: Secondary | ICD-10-CM | POA: Diagnosis not present

## 2020-06-02 DIAGNOSIS — G629 Polyneuropathy, unspecified: Secondary | ICD-10-CM | POA: Diagnosis not present

## 2020-06-02 DIAGNOSIS — I10 Essential (primary) hypertension: Secondary | ICD-10-CM | POA: Diagnosis not present

## 2020-06-26 ENCOUNTER — Telehealth (INDEPENDENT_AMBULATORY_CARE_PROVIDER_SITE_OTHER): Payer: Medicaid Other | Admitting: Psychiatry

## 2020-06-26 ENCOUNTER — Encounter: Payer: Self-pay | Admitting: Psychiatry

## 2020-06-26 DIAGNOSIS — Z9119 Patient's noncompliance with other medical treatment and regimen: Secondary | ICD-10-CM

## 2020-06-26 DIAGNOSIS — Z91199 Patient's noncompliance with other medical treatment and regimen due to unspecified reason: Secondary | ICD-10-CM

## 2020-06-26 DIAGNOSIS — F410 Panic disorder [episodic paroxysmal anxiety] without agoraphobia: Secondary | ICD-10-CM | POA: Diagnosis not present

## 2020-06-26 DIAGNOSIS — F411 Generalized anxiety disorder: Secondary | ICD-10-CM | POA: Diagnosis not present

## 2020-06-26 DIAGNOSIS — F17200 Nicotine dependence, unspecified, uncomplicated: Secondary | ICD-10-CM

## 2020-06-26 DIAGNOSIS — F1220 Cannabis dependence, uncomplicated: Secondary | ICD-10-CM

## 2020-06-26 DIAGNOSIS — F3162 Bipolar disorder, current episode mixed, moderate: Secondary | ICD-10-CM | POA: Diagnosis not present

## 2020-06-26 MED ORDER — BUSPIRONE HCL 30 MG PO TABS: 30.0000 mg | ORAL_TABLET | Freq: Two times a day (BID) | ORAL | 1 refills | Status: DC

## 2020-06-26 NOTE — Progress Notes (Signed)
Virtual Visit via Video Note  I connected with Shelly Wright on 06/26/20 at  2:20 PM EST by a video enabled telemedicine application and verified that I am speaking with the correct person using two identifiers.  Location Provider Location : [facility] Patient Location : Home  Participants: Patient , Provider    I discussed the limitations of evaluation and management by telemedicine and the availability of in person appointments. The patient expressed understanding and agreed to proceed.   I discussed the assessment and treatment plan with the patient. The patient was provided an opportunity to ask questions and all were answered. The patient agreed with the plan and demonstrated an understanding of the instructions.   The patient was advised to call back or seek an in-person evaluation if the symptoms worsen or if the condition fails to improve as anticipated.   [facility] MD OP Progress Note  06/26/2020 2:59 PM Shelly Wright  MRN:  [id number]  Chief Complaint:  Chief Complaint    Follow-up     HPI: Shelly Wright is a 50 year old Caucasian female on SSI, has a history of bipolar disorder, panic attacks, GAD, cannabis use disorder, tobacco use disorder, married, was evaluated by telemedicine today.  Patient today reports she is currently living at temporary housing with her husband and the landlord.  She reports she is trying to find a permanent housing soon.  She reports it does make her anxious however it is more manageable than before.  She continues to have mood swings on and off.  She however reports she has noticed good benefits from the venlafaxine since going up on the dosage.  She denies side effects.  Patient denies any suicidality, homicidality or perceptual disturbances.  She reports sleep is good on the trazodone.  She continues to smoke cannabis.  She continues to smoke cigarettes.  She reports that nicotine patches as not beneficial.  She cannot use the gums since it makes her  jaws to hurt.  Patient is not interested in smoking cessation classes.  Patient denies any other concerns today.  Visit Diagnosis:    ICD-10-CM   1. Bipolar 1 disorder, mixed, moderate (HCC)  F31.62   2. GAD (generalized anxiety disorder)  F41.1 busPIRone (BUSPAR) 30 MG tablet  3. Panic attacks  F41.0 busPIRone (BUSPAR) 30 MG tablet  4. Cannabis use disorder, moderate, dependence (HCC)  F12.20   5. Tobacco use disorder  F17.200   6. Noncompliance with treatment regimen  Z91.19     Past Psychiatric History: I have reviewed past psychiatric history from my progress note on 09/16/2018.  Past trials of venlafaxine, trazodone, Seroquel, hydroxyzine, Zoloft, Lexapro, duloxetine, Paxil.  Past Medical History:  Past Medical History:  Diagnosis Date   Acute right-sided low back pain with right-sided sciatica 08/30/2014   Anxiety    Arthritis    Asthma    in past, no current inhalers   Bipolar disorder (HCC)    Cervical spinal cord compression (HCC) 05/07/2012   COPD (chronic obstructive pulmonary disease) (HCC)    DDD (degenerative disc disease), lumbar    Depression    Dyspnea    with exertion   Dysrhythmia    irregular-h/o in the past   Fibromyalgia    Hypertension    Neuritis or radiculitis due to rupture of lumbar intervertebral disc 05/23/2014   Periprosthetic fracture around prosthetic knee 07/31/2015   Restless leg syndrome    Soft tissue lesion of shoulder region 01/24/2012   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   Syncope and collapse 04/08/2014   Thoracic and lumbosacral neuritis 05/07/2012   Thoracic neuritis 05/07/2012    Past Surgical History:  Procedure Laterality Date   ABDOMINAL HYSTERECTOMY     Cervical Fusion X 2     JOINT REPLACEMENT Right 01/2016   Dr Gavin Potters   KNEE ARTHROPLASTY Left 02/10/2019   Procedure: COMPUTER ASSISTED TOTAL KNEE ARTHROPLASTY - RNFA;   Surgeon: Donato Heinz, MD;  Location: [facility];  Service: Orthopedics;  Laterality: Left;   KNEE ARTHROSCOPY Bilateral    Right knee scope 1998, Left knee scope   TONSILLECTOMY     TOTAL KNEE ARTHROPLASTY Right 02/01/2015   Procedure: TOTAL KNEE ARTHROPLASTY;  Surgeon: Erin Sons, MD;  Location: [facility];  Service: Orthopedics;  Laterality: Right;   TUBAL LIGATION      Family Psychiatric History: I have reviewed family psychiatric history from my progress note on 09/16/2018.  Family History:  Family History  Problem Relation Age of Onset   Breast cancer Mother 87   Colon cancer Father    Heart attack Father    Bipolar disorder Sister    Depression Sister    Schizophrenia Sister     Social History: I have reviewed social history from my progress note on 09/16/2018. Social History   Socioeconomic History   Marital status: Legally Separated    Spouse name: Not on file   Number of children: 1   Years of education: Not on file   Highest education level: High school graduate  Occupational History   Not on file  Tobacco Use   Smoking status: Current Every Day Smoker    Packs/day: 1.00    Years: 30.00    Pack years: 30.00    Types: Cigarettes   Smokeless tobacco: Never Used  Vaping Use   Vaping Use: Never used  Substance and Sexual Activity   Alcohol use: No   Drug use: Yes    Types: Marijuana    Comment: every day   Sexual activity: Yes  Other Topics Concern   Not on file  Social History Narrative   Not on file   Social Determinants of Health   Financial Resource Strain: Not on file  Food Insecurity: Not on file  Transportation Needs: Not on file  Physical Activity: Not on file  Stress: Not on file  Social Connections: Not on file    Allergies: No Known Allergies  Metabolic Disorder Labs: No results found for: HGBA1C, MPG No results found for: PROLACTIN No results found for: CHOL, TRIG, HDL, CHOLHDL, VLDL, LDLCALC No results  found for: TSH  Therapeutic Level Labs: No results found for: LITHIUM No results found for: VALPROATE No components found for:  CBMZ  Current Medications: Current Outpatient Medications  Medication Sig Dispense Refill   ADVAIR DISKUS 250-50 MCG/DOSE AEPB Inhale 1 puff into the lungs 2 (two) times daily.     albuterol (VENTOLIN HFA) 108 (90 Base) MCG/ACT inhaler Inhale 2 puffs into the lungs 4 (four) times daily.      amoxicillin (AMOXIL) 500 MG capsule Take 500 mg by mouth 3 (three) times daily.     ARIPiprazole (ABILIFY) 20 MG tablet TAKE ONE TABLET BY MOUTH DAILY 90 tablet 0   busPIRone (BUSPAR) 30 MG tablet Take 1 tablet (30 mg total) by mouth 2 (two) times daily. 180 tablet 1   celecoxib (CELEBREX) 200 MG capsule Take 1 capsule (  200 mg total) by mouth 2 (two) times daily. (Patient taking differently: Take 200 mg by mouth daily. ) 60 capsule 1   enoxaparin (LOVENOX) 40 MG/0.4ML injection Inject 0.4 mLs (40 mg total) into the skin daily for 14 doses. 5.6 mL 0   gabapentin (NEURONTIN) 800 MG tablet Take 1 tablet (800 mg total) by mouth every 6 (six) hours. 120 tablet 5   hydrochlorothiazide (HYDRODIURIL) 25 MG tablet Take 25 mg by mouth every morning.   1   lidocaine (LIDODERM) 5 %      lidocaine (XYLOCAINE) 5 % ointment Apply 1 application topically 4 (four) times daily as needed for moderate pain. (1application= 5 g, or 6 inches of ointment) Max: 20 g/day 50 g PRN   methocarbamol (ROBAXIN) 750 MG tablet Take 1 tablet (750 mg total) by mouth 3 (three) times daily. Max: 3/day 90 tablet 5   oxcarbazepine (TRILEPTAL) 600 MG tablet TAKE ONE TABLET TWICE DAILY FOR MOOD 180 tablet 0   oxybutynin (DITROPAN-XL) 5 MG 24 hr tablet Take 5 mg by mouth daily.     simvastatin (ZOCOR) 20 MG tablet Take 20 mg by mouth at bedtime.      tiotropium (SPIRIVA HANDIHALER) 18 MCG inhalation capsule Place 18 mcg into inhaler and inhale every morning.      traZODone (DESYREL) 100 MG tablet Take 2  tablets (200 mg total) by mouth at bedtime as needed for sleep. 180 tablet 0   venlafaxine XR (EFFEXOR XR) 150 MG 24 hr capsule Take 1 capsule (150 mg total) by mouth daily with breakfast. To be combined with 37.5 mg daily 90 capsule 1   venlafaxine XR (EFFEXOR-XR) 37.5 MG 24 hr capsule Take 1 capsule (37.5 mg total) by mouth daily with breakfast. Start taking along with 150 mg 90 capsule 0   No current facility-administered medications for this visit.     Musculoskeletal: Strength & Muscle Tone: UTA Gait & Station: UTA Patient leans: N/A  Psychiatric Specialty Exam: Review of Systems  Psychiatric/Behavioral: Positive for dysphoric mood. The patient is nervous/anxious.   All other systems reviewed and are negative.   There were no vitals taken for this visit.There is no height or weight on file to calculate BMI.  General Appearance: Casual  Eye Contact:  Fair  Speech:  Normal Rate  Volume:  Normal  Mood:  Anxious and Dysphoric  Affect:  Congruent  Thought Process:  Goal Directed and Descriptions of Associations: Intact  Orientation:  Full (Time, Place, and Person)  Thought Content: Logical   Suicidal Thoughts:  No  Homicidal Thoughts:  No  Memory:  Immediate;   Fair Recent;   Fair Remote;   Fair  Judgement:  Fair  Insight:  Fair  Psychomotor Activity:  Normal  Concentration:  Concentration: Fair and Attention Span: Fair  Recall:  [employer] of Knowledge: Fair  Language: Fair  Akathisia:  No  Handed:  Right  AIMS (if indicated): UTA  Assets:  Communication Skills Desire for Improvement Housing Social Support  ADL's:  Intact  Cognition: WNL  Sleep:  Fair   Screenings: PHQ2-9   Flowsheet Row Office Visit from 11/30/2019 in [facility] Clinical Support from 08/26/2017 in [facility] Office Visit from 09/11/2016 in [facility] Procedure  visit from 08/14/2016 in [facility] Office Visit from 07/09/2016 in [facility]  PHQ-2 Total  Score 0 0 0 0 0       Assessment and Plan: Shelly Wright is a 50 year old Caucasian female, on SSI, has a history of bipolar disorder, tobacco use disorder, COPD was evaluated by telemedicine today.  Patient is biologically predisposed given her family history of mental health problems, substance abuse problems.  Patient with psychosocial stressors of legal problems, housing problems.  Patient is currently coping with her anxiety symptoms better.  Discussed plan as noted below.  Plan Bipolar disorder-improving Abilify 20 mg p.o. daily Trileptal as prescribed Trazodone 200 mg p.o. nightly as needed Venlafaxine extended release 187.5 mg p.o. daily  GAD-improving Venlafaxine extended release 187.5 mg p.o. daily BuSpar 30 mg p.o. twice daily Propranolol 10 mg p.o. 3 times daily as needed Trileptal 600 mg p.o. twice daily   Cannabis use disorder moderate-unstable We will monitor closely  Tobacco use disorder-unstable Patient is not willing to cut back Provided counseling Discontinue nicotine patch for side effects and noncompliance.  Noncompliance with treatment plan-encouraged compliance.  Patient offered psychotherapy session referral however declined in the past.  Follow-up in clinic in 1 month or sooner if needed.  I have spent atleast 20 minutes face to face by video with patient today. More than 50 % of the time was spent for preparing to see the patient ( e.g., review of test, records ),  , ordering medications and test ,psychoeducation and supportive psychotherapy and care coordination,as well as documenting clinical information in electronic health record. This note was generated in part or whole with voice recognition software. Voice recognition is usually quite accurate but there are transcription  errors that can and very often do occur. I apologize for any typographical errors that were not detected and corrected.       Jomarie Longs, MD 06/27/2020, 8:51 AM

## 2020-07-11 DIAGNOSIS — F319 Bipolar disorder, unspecified: Secondary | ICD-10-CM | POA: Diagnosis not present

## 2020-07-11 DIAGNOSIS — E782 Mixed hyperlipidemia: Secondary | ICD-10-CM | POA: Diagnosis not present

## 2020-07-11 DIAGNOSIS — M25512 Pain in left shoulder: Secondary | ICD-10-CM | POA: Diagnosis not present

## 2020-07-11 DIAGNOSIS — G629 Polyneuropathy, unspecified: Secondary | ICD-10-CM | POA: Diagnosis not present

## 2020-07-11 DIAGNOSIS — N3941 Urge incontinence: Secondary | ICD-10-CM | POA: Diagnosis not present

## 2020-07-11 DIAGNOSIS — R5383 Other fatigue: Secondary | ICD-10-CM | POA: Diagnosis not present

## 2020-07-11 DIAGNOSIS — I10 Essential (primary) hypertension: Secondary | ICD-10-CM | POA: Diagnosis not present

## 2020-07-11 DIAGNOSIS — M5440 Lumbago with sciatica, unspecified side: Secondary | ICD-10-CM | POA: Diagnosis not present

## 2020-07-11 DIAGNOSIS — J449 Chronic obstructive pulmonary disease, unspecified: Secondary | ICD-10-CM | POA: Diagnosis not present

## 2020-07-11 DIAGNOSIS — M19012 Primary osteoarthritis, left shoulder: Secondary | ICD-10-CM | POA: Diagnosis not present

## 2020-07-24 ENCOUNTER — Ambulatory Visit: Payer: Medicaid Other | Admitting: Licensed Clinical Social Worker

## 2020-07-25 ENCOUNTER — Other Ambulatory Visit: Payer: Self-pay

## 2020-07-25 ENCOUNTER — Telehealth: Payer: Self-pay

## 2020-07-25 ENCOUNTER — Telehealth (INDEPENDENT_AMBULATORY_CARE_PROVIDER_SITE_OTHER): Payer: Medicaid Other | Admitting: Psychiatry

## 2020-07-25 ENCOUNTER — Encounter: Payer: Self-pay | Admitting: Psychiatry

## 2020-07-25 DIAGNOSIS — F1220 Cannabis dependence, uncomplicated: Secondary | ICD-10-CM

## 2020-07-25 DIAGNOSIS — F17200 Nicotine dependence, unspecified, uncomplicated: Secondary | ICD-10-CM

## 2020-07-25 DIAGNOSIS — F411 Generalized anxiety disorder: Secondary | ICD-10-CM | POA: Diagnosis not present

## 2020-07-25 DIAGNOSIS — F410 Panic disorder [episodic paroxysmal anxiety] without agoraphobia: Secondary | ICD-10-CM

## 2020-07-25 DIAGNOSIS — F3178 Bipolar disorder, in full remission, most recent episode mixed: Secondary | ICD-10-CM | POA: Diagnosis not present

## 2020-07-25 DIAGNOSIS — Z79899 Other long term (current) drug therapy: Secondary | ICD-10-CM

## 2020-07-25 NOTE — Telephone Encounter (Signed)
faxed and confirmed labwork orders for  lipid panel, prolactin,, Hemo A1C, TSH dx: O25.189

## 2020-07-25 NOTE — Progress Notes (Unsigned)
Virtual Visit via Telephone Note  I connected with Shelly Wright on 07/25/20 at  2:00 PM EST by telephone and verified that I am speaking with the correct person using two identifiers.  Location Provider Location : [facility] Patient Location : Home  Participants: Patient , Provider   I discussed the limitations, risks, security and privacy concerns of performing an evaluation and management service by telephone and the availability of in person appointments. I also discussed with the patient that there Shelly Wright be a patient responsible charge related to this service. The patient expressed understanding and agreed to proceed.   I discussed the assessment and treatment plan with the patient. The patient was provided an opportunity to ask questions and all were answered. The patient agreed with the plan and demonstrated an understanding of the instructions.   The patient was advised to call back or seek an in-person evaluation if the symptoms worsen or if the condition fails to improve as anticipated.   BH MD OP Progress Note  07/25/2020 2:23 PM Sagan Wurzel Barlowe  MRN:  [id number]  Chief Complaint:  Chief Complaint    Follow-up; Anxiety     HPI: Shelly Wright is a 50 year old Caucasian female on SSI, has a history of bipolar disorder, panic attacks, GAD, cannabis use disorder, tobacco use disorder, married was evaluated by telemedicine today.  Patient today reports she recently moved into a new house at [place].  She reports she is settling down.  She reports overall her mood symptoms are stable.  Denies any significant panic attacks.  Denies any mood lability or irritability.  Patient reports sleep is overall okay.  She reports she had some dental work done recently and she had to stop smoking.  It is been almost a month.  Patient continues to smoke cannabis, 2 blunts per day and is not ready to cut back.  Patient reports she is currently struggling with back pain and is currently on meloxicam.   She reports the meloxicam effect wears off in the middle of the day and she has been in touch with her providers for management of the same.  She reports sleep as good.  She reports appetite is fair.  Patient denies any suicidality, homicidality or perceptual disturbances.  Patient denies any other concerns today.  Visit Diagnosis:    ICD-10-CM   1. Bipolar disorder, in full remission, most recent episode mixed (HCC)  F31.78   2. GAD (generalized anxiety disorder)  F41.1   3. Panic attacks  F41.0   4. Cannabis use disorder, moderate, dependence (HCC)  F12.20   5. Tobacco use disorder  F17.200   6. High risk medication use  Z79.899 Lipid panel    Prolactin    Hemoglobin A1C    TSH    Past Psychiatric History: I have reviewed past psychiatric history from my progress note on 09/16/2018.  Past trials of venlafaxine, trazodone, Seroquel, hydroxyzine, Zoloft, Lexapro, duloxetine, Paxil  Past Medical History:  Past Medical History:  Diagnosis Date  . Acute right-sided low back pain with right-sided sciatica 08/30/2014  . Anxiety   . Arthritis   . Asthma    in past, no current inhalers  . Bipolar disorder (HCC)   . Cervical spinal cord compression (HCC) 05/07/2012  . COPD (chronic obstructive pulmonary disease) (HCC)   . DDD (degenerative disc disease), lumbar   . Depression   . Dyspnea    with exertion  . Dysrhythmia    irregular-h/o in the past  .  Fibromyalgia   . Hypertension   . Neuritis or radiculitis due to rupture of lumbar intervertebral disc 05/23/2014  . Periprosthetic fracture around prosthetic knee 07/31/2015  . Restless leg syndrome   . Soft tissue lesion of shoulder region 01/24/2012  . 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 Syncope and collapse 04/08/2014  . Thoracic and lumbosacral neuritis 05/07/2012  . Thoracic neuritis 05/07/2012    Past Surgical History:  Procedure Laterality  Date  . ABDOMINAL HYSTERECTOMY    . Cervical Fusion X 2    . JOINT REPLACEMENT Right 01/2016   Dr Gavin PottersKernodle  . KNEE ARTHROPLASTY Left 02/10/2019   Procedure: COMPUTER ASSISTED TOTAL KNEE ARTHROPLASTY - RNFA;  Surgeon: Donato HeinzHooten, James P, MD;  Location: [facility];  Service: Orthopedics;  Laterality: Left;  . KNEE ARTHROSCOPY Bilateral    Right knee scope 1998, Left knee scope  . TONSILLECTOMY    . TOTAL KNEE ARTHROPLASTY Right 02/01/2015   Procedure: TOTAL KNEE ARTHROPLASTY;  Surgeon: Erin SonsHarold Kernodle, MD;  Location: [facility];  Service: Orthopedics;  Laterality: Right;  . TUBAL LIGATION      Family Psychiatric History: I have reviewed family psychiatric history from my progress note on 09/16/2018.  Family History:  Family History  Problem Relation Age of Onset  . Breast cancer Mother [id number]  . Colon cancer Father   . Heart attack Father   . Bipolar disorder Sister   . Depression Sister   . Schizophrenia Sister     Social History: I have reviewed social history from my progress note on 09/16/2018. Social History   Socioeconomic History  . Marital status: Legally Separated    Spouse name: Not on file  . Number of children: 1  . Years of education: Not on file  . Highest education level: High school graduate  Occupational History  . Not on file  Tobacco Use  . Smoking status: Former Smoker    Packs/day: 1.00    Years: 30.00    Pack years: 30.00    Types: Cigarettes    Quit date: 06/29/2020    Years since quitting: 0.0  . Smokeless tobacco: Never Used  Vaping Use  . Vaping Use: Never used  Substance and Sexual Activity  . Alcohol use: No  . Drug use: Yes    Types: Marijuana    Comment: every day  . Sexual activity: Yes  Other Topics Concern  . Not on file  Social History Narrative  . Not on file   Social Determinants of Health   Financial Resource Strain: Not on file  Food Insecurity: Not on file  Transportation Needs: Not on file  Physical Activity: Not on file   Stress: Not on file  Social Connections: Not on file    Allergies: No Known Allergies  Metabolic Disorder Labs: No results found for: HGBA1C, MPG No results found for: PROLACTIN No results found for: CHOL, TRIG, HDL, CHOLHDL, VLDL, LDLCALC No results found for: TSH  Therapeutic Level Labs: No results found for: LITHIUM No results found for: VALPROATE No components found for:  CBMZ  Current Medications: Current Outpatient Medications  Medication Sig Dispense Refill  . ADVAIR DISKUS 250-50 MCG/DOSE AEPB Inhale 1 puff into the lungs 2 (two) times daily.    Marland Kitchen. albuterol (VENTOLIN HFA) 108 (90 Base) MCG/ACT inhaler Inhale 2 puffs into the lungs 4 (four) times daily.     Marland Kitchen. amoxicillin (AMOXIL) 500 MG capsule Take  500 mg by mouth 3 (three) times daily.    . ARIPiprazole (ABILIFY) 20 MG tablet TAKE ONE TABLET BY MOUTH DAILY 90 tablet 0  . busPIRone (BUSPAR) 30 MG tablet Take 1 tablet (30 mg total) by mouth 2 (two) times daily. 180 tablet 1  . celecoxib (CELEBREX) 200 MG capsule Take 1 capsule (200 mg total) by mouth 2 (two) times daily. (Patient taking differently: Take 200 mg by mouth daily. ) 60 capsule 1  . enoxaparin (LOVENOX) 40 MG/0.4ML injection Inject 0.4 mLs (40 mg total) into the skin daily for 14 doses. 5.6 mL 0  . gabapentin (NEURONTIN) 800 MG tablet Take 1 tablet (800 mg total) by mouth every 6 (six) hours. 120 tablet 5  . hydrochlorothiazide (HYDRODIURIL) 25 MG tablet Take 25 mg by mouth every morning.   1  . lidocaine (LIDODERM) 5 %     . lidocaine (XYLOCAINE) 5 % ointment Apply 1 application topically 4 (four) times daily as needed for moderate pain. (1application= 5 g, or 6 inches of ointment) Max: 20 g/day 50 g PRN  . meloxicam (MOBIC) 15 MG tablet Take 15 mg by mouth daily.    . methocarbamol (ROBAXIN) 750 MG tablet Take 1 tablet (750 mg total) by mouth 3 (three) times daily. Max: 3/day 90 tablet 5  . oxcarbazepine (TRILEPTAL) 600 MG tablet TAKE ONE TABLET TWICE DAILY FOR  MOOD 180 tablet 0  . oxybutynin (DITROPAN-XL) 5 MG 24 hr tablet Take 5 mg by mouth daily.    . simvastatin (ZOCOR) 20 MG tablet Take 20 mg by mouth at bedtime.     Marland Kitchen tiotropium (SPIRIVA HANDIHALER) 18 MCG inhalation capsule Place 18 mcg into inhaler and inhale every morning.     . traZODone (DESYREL) 100 MG tablet Take 2 tablets (200 mg total) by mouth at bedtime as needed for sleep. 180 tablet 0  . venlafaxine XR (EFFEXOR XR) 150 MG 24 hr capsule Take 1 capsule (150 mg total) by mouth daily with breakfast. To be combined with 37.5 mg daily 90 capsule 1  . venlafaxine XR (EFFEXOR-XR) 37.5 MG 24 hr capsule Take 1 capsule (37.5 mg total) by mouth daily with breakfast. Start taking along with 150 mg 90 capsule 0   No current facility-administered medications for this visit.     Musculoskeletal: Strength & Muscle Tone: UTA Gait & Station: UTA Patient leans: N/A  Psychiatric Specialty Exam: Review of Systems  Psychiatric/Behavioral: Negative for agitation, behavioral problems, confusion, decreased concentration, dysphoric mood, hallucinations, self-injury, sleep disturbance and suicidal ideas. The patient is not nervous/anxious and is not hyperactive.   All other systems reviewed and are negative.   There were no vitals taken for this visit.There is no height or weight on file to calculate BMI.  General Appearance: UTA  Eye Contact:  UTA  Speech:  Normal Rate  Volume:  Normal  Mood:  Euthymic  Affect:  UTA  Thought Process:  Goal Directed and Descriptions of Associations: Intact  Orientation:  Full (Time, Place, and Person)  Thought Content: Logical   Suicidal Thoughts:  No  Homicidal Thoughts:  No  Memory:  Immediate;   Fair Recent;   Fair Remote;   Fair  Judgement:  Fair  Insight:  Fair  Psychomotor Activity:  UTA  Concentration:  Concentration: Fair and Attention Span: Fair  Recall:  [employer] of Knowledge: Fair  Language: Fair  Akathisia:  No  Handed:  Right  AIMS (if  indicated): UTA  Assets:  Communication  Skills Desire for Improvement Housing Social Support  ADL's:  Intact  Cognition: WNL  Sleep:  Fair   Screenings: PHQ2-9   Flowsheet Row Office Visit from 11/30/2019 in [facility] Clinical Support from 08/26/2017 in [facility] Office Visit from 09/11/2016 in [facility] Procedure visit from 08/14/2016 in [facility] Office Visit from 07/09/2016 in [facility]  PHQ-2 Total Score 0 0 0 0 0       Assessment and Plan: Lindley Stachnik Legaspi is a 50 year old Caucasian female on SSI, has a history of bipolar disorder, tobacco use disorder, COPD was evaluated by telemedicine today.  Patient is biologically predisposed given her family history of mental health problems, substance abuse problems.  Patient with psychosocial stressors of legal problem, housing problems however is currently making progress.  Plan as noted below.  Plan Bipolar disorder in remission Abilify 20 mg p.o. daily Trileptal as prescribed Trazodone 200 mg p.o. nightly as needed Venlafaxine extended release 187.5 mg p.o. daily  GAD-improving Venlafaxine extended release 187.5 mg p.o. daily BuSpar 30 mg p.o. twice daily Propranolol 10 mg p.o. 3 times daily as needed Trileptal 600 mg p.o. twice daily  Cannabis use disorder moderate-unstable Provided counseling  Tobacco use disorder in remission Patient quit smoking.  High risk medication use-will order the following labs-TSH, lipid panel, prolactin, hemoglobin A1c.  Will also consider getting EKG if not already done.  Patient to come into the office for in person visit in 2 to 3 months from now.  I have spent atleast 25 minutes non face to face with patient today which includes the time spent for preparing to see the  patient ( e.g., review of test, records ), obtaining and to review and separately obtained history , ordering medications and test ,psychoeducation and supportive psychotherapy and care coordination,as well as documenting clinical information in electronic health record.  This note was generated in part or whole with voice recognition software. Voice recognition is usually quite accurate but there are transcription errors that can and very often do occur. I apologize for any typographical errors that were not detected and corrected.       Jomarie Longs, MD 07/26/2020, 8:22 AM

## 2020-07-31 ENCOUNTER — Telehealth: Payer: Self-pay

## 2020-07-31 NOTE — Telephone Encounter (Signed)
Lipid panel , Prolaction, hemoglobin a1c, tsh dx: z79.899

## 2020-08-01 ENCOUNTER — Ambulatory Visit: Payer: Medicaid Other

## 2020-08-14 ENCOUNTER — Ambulatory Visit: Payer: Medicaid Other

## 2020-08-16 ENCOUNTER — Ambulatory Visit: Payer: Medicaid Other

## 2020-08-21 ENCOUNTER — Ambulatory Visit: Payer: Medicaid Other

## 2020-08-24 ENCOUNTER — Ambulatory Visit: Payer: Medicaid Other | Attending: Family

## 2020-08-24 ENCOUNTER — Other Ambulatory Visit: Payer: Self-pay

## 2020-08-24 DIAGNOSIS — G8929 Other chronic pain: Secondary | ICD-10-CM | POA: Insufficient documentation

## 2020-08-24 DIAGNOSIS — M25512 Pain in left shoulder: Secondary | ICD-10-CM | POA: Insufficient documentation

## 2020-08-24 DIAGNOSIS — M6281 Muscle weakness (generalized): Secondary | ICD-10-CM | POA: Insufficient documentation

## 2020-08-24 DIAGNOSIS — M545 Low back pain, unspecified: Secondary | ICD-10-CM | POA: Insufficient documentation

## 2020-08-24 DIAGNOSIS — R2681 Unsteadiness on feet: Secondary | ICD-10-CM | POA: Insufficient documentation

## 2020-08-24 DIAGNOSIS — M7918 Myalgia, other site: Secondary | ICD-10-CM | POA: Insufficient documentation

## 2020-08-24 DIAGNOSIS — M25511 Pain in right shoulder: Secondary | ICD-10-CM | POA: Insufficient documentation

## 2020-08-24 DIAGNOSIS — R2689 Other abnormalities of gait and mobility: Secondary | ICD-10-CM | POA: Insufficient documentation

## 2020-08-24 NOTE — Therapy (Signed)
[facility] MAIN [facility] SERVICES [address] [place], [place], [address] Phone: [phone]   Fax:  [phone]  Physical Therapy Evaluation  Patient Details  Name: Shelly Wright MRN: [id number] Date of Birth: 09-16-1970 Referring Provider (PT): Miki Kins, FNP   Encounter Date: 08/24/2020   PT End of Session - 08/24/20 0933    Visit Number 1    Number of Visits 25    Date for PT Re-Evaluation 11/16/20    Authorization Type eval performed 08/24/2020    PT Start Time 0816    PT Stop Time 0902    PT Time Calculation (min) 46 min    Equipment Utilized During Treatment Gait belt    Activity Tolerance Patient tolerated treatment well    Behavior During Therapy [facility] for tasks assessed/performed           Past Medical History:  Diagnosis Date  . Acute right-sided low back pain with right-sided sciatica 08/30/2014  . Anxiety   . Arthritis   . Asthma    in past, no current inhalers  . Bipolar disorder (HCC)   . Cervical spinal cord compression (HCC) 05/07/2012  . COPD (chronic obstructive pulmonary disease) (HCC)   . DDD (degenerative disc disease), lumbar   . Depression   . Dyspnea    with exertion  . Dysrhythmia    irregular-h/o in the past  . Fibromyalgia   . Hypertension   . Neuritis or radiculitis due to rupture of lumbar intervertebral disc 05/23/2014  . Periprosthetic fracture around prosthetic knee 07/31/2015  . Restless leg syndrome   . Soft tissue lesion of shoulder region 01/24/2012  . 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 Syncope and collapse 04/08/2014  . Thoracic and lumbosacral neuritis 05/07/2012  . Thoracic neuritis 05/07/2012    Past Surgical History:  Procedure Laterality Date  . ABDOMINAL HYSTERECTOMY    . Cervical Fusion X 2    . JOINT REPLACEMENT Right 01/2016   Dr Gavin Potters  . KNEE ARTHROPLASTY Left 02/10/2019   Procedure:  COMPUTER ASSISTED TOTAL KNEE ARTHROPLASTY - RNFA;  Surgeon: Donato Heinz, MD;  Location: [facility] ORS;  Service: Orthopedics;  Laterality: Left;  . KNEE ARTHROSCOPY Bilateral    Right knee scope 1998, Left knee scope  . TONSILLECTOMY    . TOTAL KNEE ARTHROPLASTY Right 02/01/2015   Procedure: TOTAL KNEE ARTHROPLASTY;  Surgeon: Erin Sons, MD;  Location: [facility] ORS;  Service: Orthopedics;  Laterality: Right;  . TUBAL LIGATION      There were no vitals filed for this visit.    Subjective Assessment - 08/24/20 0820    Subjective Pt presents to PT in transport chair with SPC. Pt's 3 y/o granddaughter is with her. Pt reports no recent falls but states "I know my balance is off."    Patient is accompained by: Family member   granddaughter (3 y/o)   Pertinent History Pt is a 50 y/o female who presents to PT in transport chair and with SPC. Pt's 3 y/o granddaughter is with her.  The pt reports she has had LBP and B shoulder pain, where L shoulder hurts worse than R, for over ten years. The pt reports she had neck surgery in the 90s, after which her shoulder pain started. She states "it kills me" to raise her shoulders overhead. She reports B shoulder pain is currently 3/10, worst pain reaches 10/10, and best  pain reaches 2/10. She says pain is constant. Lifting her arms makes it worse and is affecting her ability to perform ADLs. The pt says she has not found anything that helps the pain. She is taking gabapentin but reports it has not helped her pain. Pt reports her low back pain travels down L buttock and L LE. Pt's LBP is constant. Pt says sleeping with her "feet up" and rest helps her back pain. She says moving makes it worse, including standing and walking, and leaning backward. Pt sits down to cook and "put the dishes away" due to her back pain. The pt reports she has hx of scoliosis, and disc herniations. She states she has N/T in hands and feet. Denies and B/B changes. The pt has never had PT. The pt  has shower chair and SPC, but no other equipment in her home. The pt reports she lives by herself. 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 Pt reports she is able to sit comfortably for maybe an hour    How long can you stand comfortably? 5-10 minutes    How long can you walk comfortably? 5-10 minutes    Diagnostic tests MRI lumbar spine without contrast 03/01/2020 per chart: "FINDINGS:   Conus medullaris: terminates at approximately L1.   Alignment: Normal.   Marrow Signal: No suspicious lesions.   Sacroiliac Joints: No significant degenerative changes of the visualized SI   joints     Regional Soft Tissues: Unremarkable.     T11-T12: Left sided disc protrusion with moderate bilateral facet   arthropathy and asymmetric right greater than left hypertrophy of the   ligamenta flava resulting in moderate spinal canal stenosis with moderate   spinal canal stenosis and slight deformity of the left ventral aspect of   the spinal cord and right dorsal aspect of the spinal cord. Moderate   bilateral neuroforaminal stenosis.   T12-L1: No significant spinal canal or neuroforaminal stenosis.     L1-L2: Disc bulge with mild bilateral facet arthropathy and hypertrophy of   the ligamenta flava resulting in mild spinal canal stenosis. Mild bilateral   facet arthropathy.   L2-L3: Disc bulge with mild bilateral facet arthropathy and hypertrophy of   the ligamenta flava resulting in moderate spinal canal stenosis. Mild right   neuroforaminal stenosis. Moderate bilateral facet arthropathy.  L3-L4: Disc bulge  with loss of disc height and Schmorl's nodes. In   conjunction with moderate bilateral facet arthropathy and hypertrophy of   the ligamenta flava, this results in severe spinal canal stenosis with   crowding of the cauda equina nerve roots. Severe right neuroforaminal   stenosis. Moderate bilateral facet arthropathy.   L4-L5: Grade 1 anterolisthesis in the setting of severe bilateral facet   arthropathy. In conjunction with a disc bulge, there is severe spinal canal   stenosis with crowding of the cauda equina nerve roots. Severe left and   mild right neuroforaminal stenosis. Severe left and moderate right facet   arthropathy.   L5-S1: No significant spinal canal or neuroforaminal stenosis. Moderate   bilateral facet arthropathy.       IMPRESSION:   Multilevel  lumbar spondylosis, as above, resulting in severe spinal canal   stenosis with crowding of the cauda equina nerve roots at L3-L4 and L4-L5. ";  XRAY hip B 3 or 4 veiws with or without pelvis 02/28/2020 per chart: "Radiographs taken today, 4 views of bilateral hips, demonstrate   well-preserved joint spaces of both the left and right hip. No other bony   or soft tissue abnormality is noted.  "    Patient Stated Goals Pt states "I want to do what I want to be able to do." Pt would like to be able to stand and walk comfortably.    Currently in Pain? Yes    Pain Score 3     Pain Location Shoulder    Pain Orientation Left    Pain Descriptors / Indicators Constant;Aching;Burning    Pain Type Chronic pain    Pain Radiating Towards does report hand numbness B    Pain Onset More than a month ago    Pain Frequency Constant    Multiple Pain Sites Yes    Pain Score 4    Pain Location Back    Pain Orientation Left    Pain Descriptors / Indicators Aching;Burning;Sharp;Stabbing    Pain Type Chronic pain    Pain Radiating Towards L buttock, down L leg    Pain Onset More than a month ago    Pain Frequency Constant    Aggravating Factors  standing, walking     Pain Relieving Factors resting in recliner    Effect of Pain on Daily Activities has to sit to perform meal prep/cooking other ADLs              [facility] PT Assessment - 08/24/20 0829      Assessment   Medical Diagnosis Shoulder pain and back pain    Referring Provider (PT) Miki Kins, FNP    Onset Date/Surgical Date 05/20/10   10+ yrs ago   Hand Dominance Right    Next MD Visit April 14th    Prior Therapy no      Precautions   Precautions None      Restrictions   Weight Bearing Restrictions No      Balance Screen   Has the patient fallen in the past 6 months No   No recent falls but pt states, "I know my balance is off."   Has the patient had a decrease in activity level because of a fear of falling?  No    Is the patient reluctant to leave their home because of a fear of falling?  No      Home Environment   Living Environment Private residence    Living Arrangements Alone    Type of Home Mobile home    Home Access Stairs to enter    Entrance Stairs-Number of Steps 3    Entrance Stairs-Rails None    Home Equipment Shower seat;Cane - single point    Additional Comments --   3 steps, no hand rails     Prior Function   Level of Independence Independent    Vocation On disability    Leisure watching TV, hanging out with her granddaughter      Cognition   Overall Cognitive Status Within Functional Limits for tasks assessed            EXAM:  FOTO: 51 (goal 60)   PAIN: (see subjective/hx pt with chronic B shoulder pain and LBP)  POSTURE: Forward head posture, mild thoracic kyphosis  PROM/AROM:   UE ROM: Shoulder Flexion WFL but painful B Shoulder Abduction L 120 deg,  R 110 Deg B Shoulder IR impaired B Shoulder ER impaired and painful on L  Pt attempts to reach behind back and is unable also demonstration limited shoulder extension  Cervical screen: gross impairment of ROM (to be assessed in further detail in future session)  Thoracolumbar ROM  screen: gross impairment of spinal flexion, extension, and lateral flexion, but pt appears to have increased rotation (to be assessed in further detail in future session)   LE ROM: Hip extension impaired B (pt limited to approx 5 deg each LE) Knee ext. WFL    STRENGTH:  Graded on a 0-5 scale Muscle Group Left Right  Hip Flex 4/5* 4/5  Hip Abd 4/5 4/5  Hip Add 5/5 5/5  Knee Flex 4/5 4/5  Knee Ext 5/5 5/5  Ankle DF 5/5 5/5  Ankle PF 5/5 5/5    SENSATION:  WFL  FUNCTIONAL MOBILITY: Pt performs transfers slowly, uses UEs to assist, and reports pain in low back with transfers.  BALANCE: formal screening deferred to future session   GAIT: Antalgic, decreased stance time on LLE, prominent R hip drop and R-side lean at trunk. Uses SPC on L side. Decreased gait speed (0.66 m/s).   OUTCOME MEASURES: TEST Outcome Interpretation  5 times sit<>stand 20.8 sec >60 yo, >15 sec indicates increased risk for falls  10 meter walk test      0.66           m/s <1.0 m/s indicates increased risk for falls; limited community ambulator  Timed up and Go              deferred <14 sec indicates increased risk for falls  Berg Balance Assessment deferred <36/56 (100% risk for falls), 37-45 (80% risk for falls); 46-51 (>50% risk for falls); 52-55 (lower risk <25% of falls)   Modified Oswestry Low Back Pain Disability Questionnaire: 32%  QuickDash Disability/Symptom Score: 52   Assessment: Pt is a 50 y/o female referred to physical therapy for back pain and shoulder pain, where pt reports pain is chronic. Due to pt arrival examination was somewhat limited, and balance testing had to be deferred to future session, though pt did report decreased balance. Upon exam the pt presents with ROM impairment of her cervical, lumbar and thoracic spine, as well as ROM deficits in her BUEs and BLEs. The pt also exhibits decreased BLE strength and power (5xSTS 20.8 sec). Pt's gait is antalgic, where she demonstrated R  hip drop, decreased gait speed (0.66 m/s), and overall impaired gait mechanics. The pt scored 52 on the QuickDash, 32% on the Modified Oswestry, and a 51 on FOTO. These scores indicate increased disability d/t pt shoulder and back function, as well as overall decreased functional mobility and QOL. The pt will benefit from further skilled PT to address the aforementioned deficits in order to improve pain, ease and safety with ADLs.  Objective measurements completed on examination: See above findings.      Plan - 08/24/20 0934    Clinical Impression Statement Pt is a 50 y/o female referred to physical therapy for back pain and shoulder pain, where pt reports pain is chronic. Due to pt arrival examination was somewhat limited, and balance testing had to be deferred to future session, though pt did report decreased balance. Upon exam the pt presents with ROM impairment of her cervical, lumbar and thoracic spine, as well as ROM deficits  in her BUEs and BLEs. The pt also exhibits decreased BLE strength and power (5xSTS 20.8 sec). Pt's gait is antalgic, where she demonstrated R hip drop, decreased gait speed (0.66 m/s), and overall impaired gait mechanics. The pt scored 52 on the QuickDash, 32% on the Modified Oswestry, and a 51 on FOTO. These scores indicate increased disability d/t pt shoulder and back function, as well as overall decreased functional mobility and QOL. The pt will benefit from further skilled PT to address the aforementioned deficits in order to improve pain, ease and safety with ADLs.    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, polyneuropathy, smokes, R TKR (02/01/2015?), L TKA (02/10/2019)    Examination-Activity Limitations Bathing;Bed Mobility;Bend;Caring for Others;Carry;Dressing;Hygiene/Grooming;Lift;Locomotion Level;Reach Overhead;Self Feeding;Sit;Sleep;Squat;Stairs;Stand;Toileting;Transfers    Examination-Participation Restrictions Cleaning;Laundry;Yard Work;Medication Management;Community Activity;Driving;Meal Prep;Shop;Volunteer    Stability/Clinical Decision Making Evolving/Moderate complexity    Clinical Decision Making Moderate    Rehab Potential Fair    PT Frequency 2x / week    PT Duration 12 weeks    PT Treatment/Interventions ADLs/Self Care Home Management;Aquatic Therapy;Biofeedback;Cryotherapy;Electrical Stimulation;Moist Heat;Traction;Ultrasound;Parrafin;DME Instruction;Gait training;Stair training;Functional mobility training;Therapeutic activities;Therapeutic exercise;Balance training;Neuromuscular re-education;Patient/family education;Orthotic Fit/Training;Manual techniques;Wheelchair mobility training;Passive range of motion;Dry needling;Energy conservation;Taping;Splinting;Joint Manipulations    PT Next Visit Plan Further balance assessment, initiate HEP and strengthening and mobility exercises    PT Home Exercise Plan To be initiated next session    Consulted and Agree with Plan of Care Patient           Patient will benefit from skilled therapeutic intervention in order to improve the following deficits and impairments:  Abnormal gait,Decreased activity tolerance,Decreased endurance,Decreased knowledge of use of DME,Decreased range of motion,Decreased strength,Hypomobility,Increased fascial restricitons,Impaired sensation,Impaired UE functional use,Improper body mechanics,Pain,Cardiopulmonary status limiting activity,Decreased balance,Decreased coordination,Decreased mobility,Difficulty walking,Increased muscle  spasms,Impaired flexibility,Postural dysfunction,Obesity  Visit Diagnosis: Chronic left-sided low back pain, unspecified whether sciatica present  Muscle weakness (generalized)  Chronic left shoulder pain  Chronic right shoulder pain  Other abnormalities of gait and mobility  Chronic musculoskeletal pain     Problem List Patient Active Problem List   Diagnosis Date Noted  . Noncompliance with treatment regimen 05/01/2020  . Pharmacologic therapy 11/30/2019  . Bipolar disorder, in full remission, most recent episode mixed (HCC) 11/15/2019  . Bipolar 1 disorder, mixed, moderate (HCC) 06/28/2019  . High risk medication use 06/23/2019  . Bipolar disorder, in full remission, most recent episode depressed (HCC) 06/09/2019  . Total knee replacement status 02/10/2019  . Abnormal ECG 02/09/2019  . Panic attacks 02/03/2019  . GAD (generalized anxiety disorder) 11/25/2018  . Cannabis use disorder, moderate, dependence (HCC) 11/25/2018  . Tobacco use disorder 11/25/2018  . Chronic musculoskeletal pain 07/29/2018  . Hypercholesteremia 08/26/2017  . 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 Chronic sacroiliac joint pain (Primary Source of Pain) (Bilateral) (L>R) 07/09/2016  . Lumbar facet syndrome (Bilateral) (L>R) 07/09/2016  . Neurogenic pain 07/09/2016  . Osteoarthritis 07/09/2016  . Chronic knee pain after total knee replacement (Right) 07/09/2016  . Marijuana use 06/25/2016  . Bipolar I disorder, most recent episode depressed (HCC) 05/06/2016  . BP (high blood pressure) 05/06/2016  . Irregular cardiac rhythm 05/06/2016  . Extreme obesity 05/06/2016  . Long term current use of anticoagulant therapy (Lovenox) 05/06/2016  .  Long term current use of opiate analgesic 05/06/2016  . Long term prescription opiate use 05/06/2016  . Opiate use 05/06/2016  . Opioid dependence, uncomplicated (HCC) 05/06/2016  . Chronic hip pain (Primary Area of Pain) (Bilateral) (L>R)  05/06/2016  . Chronic neck pain (Bilateral) (L>R) 05/06/2016  . Chronic knee pain (Bilateral) (L>R) 05/06/2016  . Radiculopathy of cervical region (B)(R>L)(Thumb + Index fingers) 12/21/2015  . Chronic shoulder pain (Third area of Pain) (Bilateral) (L>R) 12/21/2015  . S/P TKR Arthroplasty (Right) 07/13/2015  . Osteoarthritis of knee (Left) 11/17/2014  . DDD (degenerative disc disease), lumbar 08/30/2014  . Lumbar canal stenosis (L3-4 and L4-5) 05/23/2014  . Anxiety and depression 04/25/2014  . Fibromyalgia 04/25/2014  . Personal history of perinatal problems 04/25/2014  . Chronic low back pain (Secondary area of Pain) (Bilateral) (L>R) 04/25/2014  . Vitamin D deficiency 04/25/2014  . H/O syncope 04/25/2014  . Hyperlipidemia 04/25/2014  . Breathlessness on exertion 04/20/2014  . PVC's (premature ventricular contractions) 04/08/2014  . Asthma 03/26/2014  . Cardiac conduction disorder 03/26/2014  . Hypercholesterolemia without hypertriglyceridemia 03/26/2014  . Chronic pain syndrome 12/30/2013  . Cervical post-laminectomy syndrome (Anterior-posterior approach) (2014) 12/30/2013  . Major depressive disorder, single episode 05/08/2013  . Depression 05/08/2013  . Thoracic or lumbosacral neuritis or radiculitis 05/07/2012   Temple Pacini PT, DPT 08/24/2020, 4:54 PM  [facility] MAIN [facility] SERVICES [address] [place], [place], [address] Phone: [phone]   Fax:  [phone]  Name: Shelly Wright MRN: [id number] Date of Birth: 06-24-1970

## 2020-08-28 ENCOUNTER — Ambulatory Visit: Payer: Medicaid Other

## 2020-08-28 ENCOUNTER — Other Ambulatory Visit: Payer: Self-pay

## 2020-08-28 VITALS — BP 123/66 | HR 81

## 2020-08-28 DIAGNOSIS — R2681 Unsteadiness on feet: Secondary | ICD-10-CM

## 2020-08-28 DIAGNOSIS — R2689 Other abnormalities of gait and mobility: Secondary | ICD-10-CM

## 2020-08-28 DIAGNOSIS — M545 Low back pain, unspecified: Secondary | ICD-10-CM | POA: Diagnosis not present

## 2020-08-28 DIAGNOSIS — M6281 Muscle weakness (generalized): Secondary | ICD-10-CM
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
## 2020-08-30 ENCOUNTER — Other Ambulatory Visit: Payer: Self-pay

## 2020-08-30 ENCOUNTER — Ambulatory Visit: Payer: Medicaid Other

## 2020-08-30 DIAGNOSIS — M6281 Muscle weakness (generalized): Secondary | ICD-10-CM

## 2020-08-30 DIAGNOSIS — M545 Low back pain, unspecified: Secondary | ICD-10-CM | POA: Diagnosis not present

## 2020-08-30 DIAGNOSIS — R2689 Other abnormalities of gait and mobility: Secondary | ICD-10-CM

## 2020-08-30 DIAGNOSIS — G8929 Other chronic pain: Secondary | ICD-10-CM

## 2020-08-30 NOTE — Therapy (Signed)
[place] [facility] MAIN [facility] SERVICES [address] [place], [place], [address] Phone: [phone]   Fax:  [phone]  Physical Therapy Treatment  Patient Details  Name: Shelly Wright MRN: [id number] Date of Birth: 09-Sep-1970 Referring Provider (PT): Miki Kins, FNP   Encounter Date: 08/30/2020   PT End of Session - 08/30/20 1844    Visit Number 3    Number of Visits 25    Date for PT Re-Evaluation 11/16/20    Authorization Type eval performed 08/24/2020    PT Start Time 1509    PT Stop Time 1555    PT Time Calculation (min) 46 min    Equipment Utilized During Treatment Gait belt    Activity Tolerance Patient tolerated treatment well    Behavior During Therapy [facility] for tasks assessed/performed           Past Medical History:  Diagnosis Date  . Acute right-sided low back pain with right-sided sciatica 08/30/2014  . Anxiety   . Arthritis   . Asthma    in past, no current inhalers  . Bipolar disorder (HCC)   . Cervical spinal cord compression (HCC) 05/07/2012  . COPD (chronic obstructive pulmonary disease) (HCC)   . DDD (degenerative disc disease), lumbar   . Depression   . Dyspnea    with exertion  . Dysrhythmia    irregular-h/o in the past  . Fibromyalgia   . Hypertension   . Neuritis or radiculitis due to rupture of lumbar intervertebral disc 05/23/2014  . Periprosthetic fracture around prosthetic knee 07/31/2015  . Restless leg syndrome   . Soft tissue lesion of shoulder region 01/24/2012  . 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 Syncope and collapse 04/08/2014  . Thoracic and lumbosacral neuritis 05/07/2012  . Thoracic neuritis 05/07/2012    Past Surgical History:  Procedure Laterality Date  . ABDOMINAL HYSTERECTOMY    . Cervical Fusion X 2    . JOINT REPLACEMENT Right 01/2016   Dr Gavin Potters  . KNEE ARTHROPLASTY Left 02/10/2019   Procedure:  COMPUTER ASSISTED TOTAL KNEE ARTHROPLASTY - RNFA;  Surgeon: Donato Heinz, MD;  Location: [facility] ORS;  Service: Orthopedics;  Laterality: Left;  . KNEE ARTHROSCOPY Bilateral    Right knee scope 1998, Left knee scope  . TONSILLECTOMY    . TOTAL KNEE ARTHROPLASTY Right 02/01/2015   Procedure: TOTAL KNEE ARTHROPLASTY;  Surgeon: Erin Sons, MD;  Location: [facility] ORS;  Service: Orthopedics;  Laterality: Right;  . TUBAL LIGATION      There were no vitals filed for this visit.   Subjective Assessment - 08/30/20 1841    Subjective Pt reports usual basleine level of LBP. She reports no other changes since previous session.    Patient is accompained by: Family member   granddaughter (3 y/o)   Pertinent History Pt is a 50 y/o female who presents to PT in transport chair and with SPC. Pt's 3 y/o granddaughter is with her.  The pt reports she has had LBP and B shoulder pain, where L shoulder hurts worse than R, for over ten years. The pt reports she had neck surgery in the 90s, after which her shoulder pain started. She states "it kills me" to raise her shoulders overhead. She reports B shoulder pain is currently 3/10, worst pain reaches 10/10, and best pain reaches 2/10. She says pain is constant. Lifting her arms makes it worse and  is affecting her ability to perform ADLs. The pt says she has not found anything that helps the pain. She is taking gabapentin but reports it has not helped her pain. Pt reports her low back pain travels down L buttock and L LE. Pt's LBP is constant. Pt says sleeping with her "feet up" and rest helps her back pain. She says moving makes it worse, including standing and walking, and leaning backward. Pt sits down to cook and "put the dishes away" due to her back pain. The pt reports she has hx of scoliosis, and disc herniations. She states she has N/T in hands and feet. Denies and B/B changes. The pt has never had PT. The pt has shower chair and SPC, but no other equipment in her  home. The pt reports she lives by herself. 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 Pt reports she is able to sit comfortably for maybe an hour    How long can you stand comfortably? 5-10 minutes    How long can you walk comfortably? 5-10 minutes    Diagnostic tests MRI lumbar spine without contrast 03/01/2020 per chart: "FINDINGS:   Conus medullaris: terminates at approximately L1.   Alignment: Normal.   Marrow Signal: No suspicious lesions.   Sacroiliac Joints: No significant degenerative changes of the visualized SI   joints     Regional Soft Tissues: Unremarkable.     T11-T12: Left sided disc protrusion with moderate bilateral facet   arthropathy and asymmetric right greater than left hypertrophy of the   ligamenta flava resulting in moderate spinal canal stenosis with moderate   spinal canal stenosis and slight deformity of the left ventral aspect of   the spinal cord and right dorsal aspect of the spinal cord. Moderate   bilateral neuroforaminal stenosis.   T12-L1: No significant spinal canal or neuroforaminal stenosis.     L1-L2: Disc bulge with mild bilateral facet arthropathy and hypertrophy of   the ligamenta flava resulting in mild spinal canal stenosis. Mild bilateral   facet arthropathy.   L2-L3: Disc bulge with mild bilateral facet arthropathy and hypertrophy of   the ligamenta flava resulting in moderate spinal canal stenosis. Mild right   neuroforaminal stenosis. Moderate bilateral facet arthropathy.  L3-L4: Disc bulge with loss of disc height and Schmorl's nodes. In    conjunction with moderate bilateral facet arthropathy and hypertrophy of   the ligamenta flava, this results in severe spinal canal stenosis with   crowding of the cauda equina nerve roots. Severe right neuroforaminal   stenosis. Moderate bilateral facet arthropathy.   L4-L5: Grade 1 anterolisthesis in the setting of severe bilateral facet   arthropathy. In conjunction with a disc bulge, there is severe spinal canal   stenosis with crowding of the cauda equina nerve roots. Severe left and   mild right neuroforaminal stenosis. Severe left and moderate right facet   arthropathy.   L5-S1: No significant spinal canal or neuroforaminal stenosis. Moderate   bilateral facet arthropathy.       IMPRESSION:   Multilevel lumbar spondylosis, as above, resulting in severe spinal canal   stenosis with crowding of  the cauda equina nerve roots at L3-L4 and L4-L5. ";  XRAY hip B 3 or 4 veiws with or without pelvis 02/28/2020 per chart: "Radiographs taken today, 4 views of bilateral hips, demonstrate   well-preserved joint spaces of both the left and right hip. No other bony   or soft tissue abnormality is noted.  "    Patient Stated Goals Pt states "I want to do what I want to be able to do." Pt would like to be able to stand and walk comfortably.    Currently in Pain? Yes    Pain Location Back    Pain Orientation Lower;Left    Pain Onset More than a month ago    Pain Onset More than a month ago           TREATMENT  THEREX: all except for physioball rollouts performed with pt n supine  Pt supine, bolster placed under pt BLEs, hot pack placed to low back musculature x 20 minutes. Skin checked before and after removal of hot pack with no adverse reaction observed and no adverse reaction reported to tx.  TA contraction with posterior pelvic tilt 10x; VC/TC, demo for technique TA contraction, posterior pelvic tilt with marches 2x10; discontinued due to increased back pain that resolved with discontinuation of  exercise. Supine adductor squeezes with TA contraction and posterior pelvic tilt 3x10; VC for technique Lower trunk rotations 2x20, hands on assist for first 10 reps to cue for form. Supine hamstring measurement with LEs in 90 deg hip flexion: pt exhibited full knee extension B, indicating no impairment in hamstring length B FABER stretch on LLE x60 sec; pt does report feeling some stretch. Full stretch not performed on R as pt with full ROM without reports of tightness. Glute bridges 1x10, 2x15. VC/TC for technique; pt rates between easy and medium difficulty. Exhibits decreased hip extension AROM with all sets, indicating decreased strength of hip extensors.  Gastroc length assessment: impaired B: pt reporting feeling tightness with stretch up to "back of thigh" on L Supine hip abduction with RTB 2x15, GTB 1x15; VC/TC for technique Seated physioball rollouts with 2-3 sec hold forward/backward and side to side 10x each way; VC for technique  Education provided throughout session in the form of VC/TC and demonstration to facilitate movement at target joints and correct muscle activation with exercises. The pt demonstrated good carryover within session following cuing.   Assessment: Pt performed majority of exercises in supine today to work on back pain modulation techniques, gentle strengthening and muscular coordination as well as mobility exercises for her back. Pt only reported increased back pain with supine marches, which resolved to baseline when exercise discontinued. Pt with reduced AROM with glute bridges, indicating decreased strength of hip extensors. Pt did exhibit good tissue extensibility of B hamstrings, but reports tension up to thigh with dorsiflexion muscle length assessment on LLE. Next session will have pt attempt neural flossing technique of LLE to determine if sx can be reduced. The pt will continue to benefit from further skilled therapy to improve pain, BLE strength and mobility  in order to increase QOL.   PT Education - 08/30/20 1843    Education Details Technique, body mechanics with TA contraction and posterior pelvic tilt in supine    Person(s) Educated Patient    Methods Explanation;Demonstration;Tactile cues;Verbal cues    Comprehension Verbalized understanding;Returned demonstration            PT Short Term Goals - 08/24/20 1633  PT SHORT TERM GOAL #1   Title Patient will be independent in home exercise program to improve strength/mobility for better functional independence with ADLs.    Baseline 4/7: to be initiated next appointment    Time 6    Period Weeks    Status New    Target Date 10/05/20      PT SHORT TERM GOAL #2   Title Pt will demonstrate improvement in B shoulder abduction by at least 20 degrees in order to improve ease with pt washing her hair.    Baseline 4/7: L shoulder abduction 120 deg., R shoulder abduction 100 deg.    Time 6    Period Weeks    Status New    Target Date 10/05/20             PT Long Term Goals - 08/28/20 1717      PT LONG TERM GOAL #1   Title Patient will increase FOTO score to equal to or greater than 60 to demonstrate statistically significant improvement in mobility and quality of life.    Baseline 4/7: 51    Time 12    Period Weeks    Status New    Target Date 11/16/20      PT LONG TERM GOAL #2   Title Patient (< 44 years old) will complete five times sit to stand test in < 10 seconds indicating an increased LE strength and improved balance.    Baseline 4/7: 20.8 sec    Time 12    Period Weeks    Status New    Target Date 11/16/20      PT LONG TERM GOAL #3   Title Patient will report a worst L shoulder pain of 5/10 on VAS in last seven days to improve tolerance with ADLs and reduced symptoms with activities.    Baseline 4/7: worst pain 10/10    Time 12    Period Weeks    Status New    Target Date 11/16/20      PT LONG TERM GOAL #4   Title Patient will reduce modified Oswestry  score to <20 as to demonstrate minimal disability with ADLs including improved sleeping tolerance, walking/sitting tolerance etc for better mobility with ADLs.    Baseline 4/7: 32%    Time 12    Period Weeks    Status New    Target Date 11/16/20      PT LONG TERM GOAL #5   Title Patient will decrease Quick DASH score by > 8 points demonstrating reduced self-reported upper extremity disability.    Baseline 4/7: 52%    Time 12    Period Weeks    Status New    Target Date 11/16/20      Additional Long Term Goals   Additional Long Term Goals Yes      PT LONG TERM GOAL #6   Title Pt will demonstrate an increase in B hip flexor, B hip abductor, and B knee flexor strength by at least 1/2 point on the MMT to indicate improved strength to increase ease with all functional mobility.    Baseline 4/7: B hip flexion 4/5, B hip abductoin 4/5, B knee flexion 4/5    Time 12    Period Weeks    Status New    Target Date 11/16/20      PT LONG TERM GOAL #7   Title Patient will reduce timed up and go to <11 seconds to reduce fall risk and demonstrate  improved transfer/gait ability.    Baseline 4/11: 15.5 sec    Time 11    Period Weeks    Status New    Target Date 11/16/20                 Plan - 08/30/20 1858    Clinical Impression Statement Pt performed majority of exercises in supine today to work on back pain modulation techniques, gentle strengthening and muscular coordination as well as mobility exercises for her back. Pt only reported increased back pain with supine marches, which resolved to baseline when exercise discontinued. Pt with reduced AROM with glute bridges, indicating decreased strength of hip extensors. Pt did exhibit good tissue extensibility of B hamstrings, but reports tension up to thigh with dorsiflexion muscle length assessment on LLE. Next session will have pt attempt neural flossing technique of LLE to determine if sx can be reduced. The pt will continue to benefit  from further skilled therapy to improve pain, BLE strength and mobility in order to increase QOL.    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, polyneuropathy, smokes, R TKR (02/01/2015?), L TKA (02/10/2019)    Examination-Activity Limitations Bathing;Bed Mobility;Bend;Caring for Others;Carry;Dressing;Hygiene/Grooming;Lift;Locomotion Level;Reach Overhead;Self Feeding;Sit;Sleep;Squat;Stairs;Stand;Toileting;Transfers    Examination-Participation Restrictions Cleaning;Laundry;Yard Work;Medication Management;Community Activity;Driving;Meal Prep;Shop;Volunteer    Stability/Clinical Decision Making Evolving/Moderate complexity    Rehab Potential Fair    PT Frequency 2x / week    PT Duration 12 weeks    PT Treatment/Interventions ADLs/Self Care Home Management;Aquatic Therapy;Biofeedback;Cryotherapy;Electrical Stimulation;Moist Heat;Traction;Ultrasound;Parrafin;DME Instruction;Gait training;Stair training;Functional mobility training;Therapeutic activities;Therapeutic exercise;Balance training;Neuromuscular re-education;Patient/family education;Orthotic Fit/Training;Manual techniques;Wheelchair mobility training;Passive range of motion;Dry needling;Energy conservation;Taping;Splinting;Joint Manipulations    PT Next Visit Plan Further balance assessment, initiate HEP and strengthening and mobility exercises; nerve flossing LLE, practice meal-prep in kitchen/body mechanics    PT Home Exercise Plan access code: [email or web address]    Consulted and Agree with Plan of Care Patient            Patient will benefit from skilled therapeutic intervention in order to improve the following deficits and impairments:  Abnormal gait,Decreased activity tolerance,Decreased endurance,Decreased knowledge of use of DME,Decreased range of motion,Decreased strength,Hypomobility,Increased fascial restricitons,Impaired sensation,Impaired UE functional use,Improper body mechanics,Pain,Cardiopulmonary status limiting activity,Decreased balance,Decreased coordination,Decreased mobility,Difficulty walking,Increased muscle spasms,Impaired flexibility,Postural dysfunction,Obesity  Visit Diagnosis: Other abnormalities of gait and mobility  Muscle weakness (generalized)  Chronic musculoskeletal pain     Problem List Patient Active Problem List   Diagnosis Date Noted  . Noncompliance with treatment regimen 05/01/2020  . Pharmacologic therapy 11/30/2019  . Bipolar disorder, in full remission, most recent episode mixed (HCC) 11/15/2019  . Bipolar 1 disorder, mixed, moderate (HCC) 06/28/2019  . High risk medication use 06/23/2019  . Bipolar disorder, in full remission, most recent episode depressed (HCC) 06/09/2019  . Total knee replacement status 02/10/2019  . Abnormal ECG 02/09/2019  . Panic attacks 02/03/2019  . GAD (generalized anxiety disorder) 11/25/2018  . Cannabis use disorder, moderate, dependence (HCC) 11/25/2018  . Tobacco use disorder 11/25/2018  . Chronic musculoskeletal pain 07/29/2018  . Hypercholesteremia 08/26/2017  . 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 Chronic sacroiliac joint pain (Primary Source of Pain) (Bilateral) (L>R) 07/09/2016  . Lumbar facet syndrome (Bilateral) (L>R) 07/09/2016  . Neurogenic pain 07/09/2016  . Osteoarthritis  07/09/2016  . Chronic knee pain after total knee replacement (Right) 07/09/2016  . Marijuana use 06/25/2016  . Bipolar I disorder, most recent episode depressed (HCC) 05/06/2016  . BP (high blood pressure) 05/06/2016  .  Irregular cardiac rhythm 05/06/2016  . Extreme obesity 05/06/2016  . Long term current use of anticoagulant therapy (Lovenox) 05/06/2016  . Long term current use of opiate analgesic 05/06/2016  . Long term prescription opiate use 05/06/2016  . Opiate use 05/06/2016  . Opioid dependence, uncomplicated (HCC) 05/06/2016  . Chronic hip pain (Primary Area of Pain) (Bilateral) (L>R) 05/06/2016  . Chronic neck pain (Bilateral) (L>R) 05/06/2016  . Chronic knee pain (Bilateral) (L>R) 05/06/2016  . Radiculopathy of cervical region (B)(R>L)(Thumb + Index fingers) 12/21/2015  . Chronic shoulder pain (Third area of Pain) (Bilateral) (L>R) 12/21/2015  . S/P TKR Arthroplasty (Right) 07/13/2015  . Osteoarthritis of knee (Left) 11/17/2014  . DDD (degenerative disc disease), lumbar 08/30/2014  . Lumbar canal stenosis (L3-4 and L4-5) 05/23/2014  . Anxiety and depression 04/25/2014  . Fibromyalgia 04/25/2014  . Personal history of perinatal problems 04/25/2014  . Chronic low back pain (Secondary area of Pain) (Bilateral) (L>R) 04/25/2014  . Vitamin D deficiency 04/25/2014  . H/O syncope 04/25/2014  . Hyperlipidemia 04/25/2014  . Breathlessness on exertion 04/20/2014  . PVC's (premature ventricular contractions) 04/08/2014  . Asthma 03/26/2014  . Cardiac conduction disorder 03/26/2014  . Hypercholesterolemia without hypertriglyceridemia 03/26/2014  . Chronic pain syndrome 12/30/2013  . Cervical post-laminectomy syndrome (Anterior-posterior approach) (2014) 12/30/2013  . Major depressive disorder, single episode 05/08/2013  . Depression 05/08/2013  . Thoracic or lumbosacral neuritis or radiculitis 05/07/2012   Temple Pacini PT, DPT 08/30/2020, 6:59 PM  [place] [facility] MAIN [facility] SERVICES [address] [place], [place], [address] Phone: [phone]   Fax:  [phone]  Name: Shelly Wright MRN: [id number] Date of Birth: [birth year]

## 2020-08-31 DIAGNOSIS — G629 Polyneuropathy, unspecified: Secondary | ICD-10-CM | POA: Diagnosis not present

## 2020-08-31 DIAGNOSIS — I10 Essential (primary) hypertension: Secondary | ICD-10-CM | POA: Diagnosis not present

## 2020-08-31 DIAGNOSIS — E669 Obesity, unspecified: Secondary | ICD-10-CM | POA: Diagnosis not present

## 2020-08-31 DIAGNOSIS — N3941 Urge incontinence: Secondary | ICD-10-CM | POA: Diagnosis not present

## 2020-08-31 DIAGNOSIS — F319 Bipolar disorder, unspecified: Secondary | ICD-10-CM | POA: Diagnosis not present

## 2020-08-31 DIAGNOSIS — E782 Mixed hyperlipidemia: Secondary | ICD-10-CM | POA: Diagnosis not present

## 2020-08-31 DIAGNOSIS — J449 Chronic obstructive pulmonary disease, unspecified: Secondary | ICD-10-CM | POA: Diagnosis not present

## 2020-09-04 ENCOUNTER — Ambulatory Visit: Payer: Medicaid Other

## 2020-09-04 ENCOUNTER — Other Ambulatory Visit: Payer: Self-pay

## 2020-09-04 DIAGNOSIS — R2689 Other abnormalities of gait and mobility: Secondary | ICD-10-CM

## 2020-09-04 DIAGNOSIS — M545 Low back pain, unspecified: Secondary | ICD-10-CM | POA: Diagnosis not present

## 2020-09-04 DIAGNOSIS — R2681 Unsteadiness on feet: Secondary | ICD-10-CM

## 2020-09-04 DIAGNOSIS — M6281 Muscle weakness (generalized): Secondary | ICD-10-CM

## 2020-09-04 NOTE — Therapy (Signed)
[facility] MAIN [facility] SERVICES [address] [place], [place], [address] Phone: [phone]   Fax:  [phone]  Physical Therapy Treatment  Patient Details  Name: Shelly Wright MRN: [id number] Date of Birth: 04-Dec-1970 Referring Provider (PT): Miki Kins, FNP   Encounter Date: 09/04/2020   PT End of Session - 09/04/20 1559    Visit Number 4    Number of Visits 25    Date for PT Re-Evaluation 11/16/20    Authorization Type eval performed 08/24/2020    PT Start Time 1516    PT Stop Time 1558    PT Time Calculation (min) 42 min    Equipment Utilized During Treatment Gait belt    Activity Tolerance Patient tolerated treatment well    Behavior During Therapy [facility] for tasks assessed/performed           Past Medical History:  Diagnosis Date  . Acute right-sided low back pain with right-sided sciatica 08/30/2014  . Anxiety   . Arthritis   . Asthma    in past, no current inhalers  . Bipolar disorder (HCC)   . Cervical spinal cord compression (HCC) 05/07/2012  . COPD (chronic obstructive pulmonary disease) (HCC)   . DDD (degenerative disc disease), lumbar   . Depression   . Dyspnea    with exertion  . Dysrhythmia    irregular-h/o in the past  . Fibromyalgia   . Hypertension   . Neuritis or radiculitis due to rupture of lumbar intervertebral disc 05/23/2014  . Periprosthetic fracture around prosthetic knee 07/31/2015  . Restless leg syndrome   . Soft tissue lesion of shoulder region 01/24/2012  . 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 Syncope and collapse 04/08/2014  . Thoracic and lumbosacral neuritis 05/07/2012  . Thoracic neuritis 05/07/2012    Past Surgical History:  Procedure Laterality Date  . ABDOMINAL HYSTERECTOMY    . Cervical Fusion X 2    . JOINT REPLACEMENT Right 01/2016   Dr Gavin Potters  . KNEE ARTHROPLASTY Left 02/10/2019   Procedure:  COMPUTER ASSISTED TOTAL KNEE ARTHROPLASTY - RNFA;  Surgeon: Donato Heinz, MD;  Location: [facility] ORS;  Service: Orthopedics;  Laterality: Left;  . KNEE ARTHROSCOPY Bilateral    Right knee scope 1998, Left knee scope  . TONSILLECTOMY    . TOTAL KNEE ARTHROPLASTY Right 02/01/2015   Procedure: TOTAL KNEE ARTHROPLASTY;  Surgeon: Erin Sons, MD;  Location: [facility] ORS;  Service: Orthopedics;  Laterality: Right;  . TUBAL LIGATION      There were no vitals filed for this visit.   Subjective Assessment - 09/04/20 1520    Subjective Pt reports L sided hip pain and and L sided low back pain. She says the pain is sharp and that it can get up to a 10/10.  She says it is a 4/10 currently.    Patient is accompained by: Family member   granddaughter (3 y/o)   Pertinent History Pt is a 50 y/o female who presents to PT in transport chair and with SPC. Pt's 3 y/o granddaughter is with her.  The pt reports she has had LBP and B shoulder pain, where L shoulder hurts worse than R, for over ten years. The pt reports she had neck surgery in the 90s, after which her shoulder pain started. She states "it kills me" to raise her shoulders overhead. She reports B shoulder pain is currently 3/10,  worst pain reaches 10/10, and best pain reaches 2/10. She says pain is constant. Lifting her arms makes it worse and is affecting her ability to perform ADLs. The pt says she has not found anything that helps the pain. She is taking gabapentin but reports it has not helped her pain. Pt reports her low back pain travels down L buttock and L LE. Pt's LBP is constant. Pt says sleeping with her "feet up" and rest helps her back pain. She says moving makes it worse, including standing and walking, and leaning backward. Pt sits down to cook and "put the dishes away" due to her back pain. The pt reports she has hx of scoliosis, and disc herniations. She states she has N/T in hands and feet. Denies and B/B changes. The pt has never had PT. The  pt has shower chair and SPC, but no other equipment in her home. The pt reports she lives by herself. 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 Pt reports she is able to sit comfortably for maybe an hour    How long can you stand comfortably? 5-10 minutes    How long can you walk comfortably? 5-10 minutes    Diagnostic tests MRI lumbar spine without contrast 03/01/2020 per chart: "FINDINGS:   Conus medullaris: terminates at approximately L1.   Alignment: Normal.   Marrow Signal: No suspicious lesions.   Sacroiliac Joints: No significant degenerative changes of the visualized SI   joints     Regional Soft Tissues: Unremarkable.     T11-T12: Left sided disc protrusion with moderate bilateral facet   arthropathy and asymmetric right greater than left hypertrophy of the   ligamenta flava resulting in moderate spinal canal stenosis with moderate   spinal canal stenosis and slight deformity of the left ventral aspect of   the spinal cord and right dorsal aspect of the spinal cord. Moderate   bilateral neuroforaminal stenosis.   T12-L1: No significant spinal canal or neuroforaminal stenosis.     L1-L2: Disc bulge with mild bilateral facet arthropathy and hypertrophy of   the ligamenta flava resulting in mild spinal canal stenosis. Mild bilateral   facet arthropathy.   L2-L3: Disc bulge with mild bilateral facet arthropathy and hypertrophy of   the ligamenta flava resulting in moderate spinal canal stenosis. Mild right   neuroforaminal stenosis. Moderate bilateral facet arthropathy.  L3-L4: Disc bulge  with loss of disc height and Schmorl's nodes. In   conjunction with moderate bilateral facet arthropathy and hypertrophy of   the ligamenta flava, this results in severe spinal canal stenosis with   crowding of the cauda equina nerve roots. Severe right neuroforaminal   stenosis. Moderate bilateral facet arthropathy.   L4-L5: Grade 1 anterolisthesis in the setting of severe bilateral facet   arthropathy. In conjunction with a disc bulge, there is severe spinal canal   stenosis with crowding of the cauda equina nerve roots. Severe left and   mild right neuroforaminal stenosis. Severe left and moderate right facet   arthropathy.   L5-S1: No significant spinal canal or neuroforaminal stenosis. Moderate   bilateral facet arthropathy.  IMPRESSION:   Multilevel lumbar spondylosis, as above, resulting in severe spinal canal   stenosis with crowding of the cauda equina nerve roots at L3-L4 and L4-L5. ";  XRAY hip B 3 or 4 veiws with or without pelvis 02/28/2020 per chart: "Radiographs taken today, 4 views of bilateral hips, demonstrate   well-preserved joint spaces of both the left and right hip. No other bony   or soft tissue abnormality is noted.  "    Patient Stated Goals Pt states "I want to do what I want to be able to do." Pt would like to be able to stand and walk comfortably.    Currently in Pain? Yes    Pain Score 4     Pain Location Hip    Pain Orientation Left    Pain Descriptors / Indicators Sharp    Pain Onset More than a month ago    Pain Onset More than a month ago         TREATMENT   THEREX:   Nustep level 0, cuing for SPM 40s-60s x 4 min, CGA for mount/dismount. Discontinued d/t increased pain on medial RLE.  FABER strecth 2x30 sec BLEs. Pt reports increased tightness on LLE Seated hamstring stretch 2x30 BLEs; Demo/VC for technique; pulling more on the LLE. Seated marches with 4# AW 1x20, 2x15; pt rates easy-medium Seated LAQ with 4# AW 2x20; pt rates exercise "hard"  Standing  hamstring curl with 4# 1x15, 2x10; pt rates "hard," Pt does report some increase in L side buttock and leg pain. Physioball rollouts forward/backward and side-to-side with 2 sec hold at end range 15x forward/backward, 5x for side-to-side  Neuro Re-Ed: CGA throughout  Pt standing feet together 2x30 sec Standing semi-tandem 2x30 sec each LE; More stability on LLE  SLB 2x30 sec BLEs; Pt able to maintain LLE SLB for 3 sec, but requires UE support with RLE as stance leg. Obstacle course stepping over half foam and hurdle with cone taps  - intermittent UE support. Two instances of inability to clear cones and one LOB requiring CGA and UE support to regain balance.   Education provided throughout session in the form of VC/TC and demonstration to facilitate movement at target joints and correct muscle activation with exercises. The pt demonstrated good carryover within session following cuing.  Assessment: Pt challenged with static and dynamic balance tasks, with one instance of LOB requiring CGA and UE support to regain balance. She has difficulty with SLB on RLE but not LLE. The pt will benefit from further skilled therapy to improve pain, BLE strength, and balance to improve QOL.      PT Short Term Goals - 08/24/20 1633      PT SHORT TERM GOAL #1   Title Patient will be independent in home exercise program to improve strength/mobility for better functional independence with ADLs.    Baseline 4/7: to be initiated next appointment    Time 6    Period Weeks    Status New    Target Date 10/05/20      PT SHORT TERM GOAL #2   Title Pt will demonstrate improvement in B shoulder abduction by at least 20 degrees in order to improve ease with pt washing her hair.    Baseline 4/7: L shoulder abduction 120 deg., R shoulder abduction 100 deg.    Time 6    Period Weeks    Status New    Target Date 10/05/20  PT Long Term Goals - 08/28/20 1717      PT LONG TERM GOAL #1   Title Patient  will increase FOTO score to equal to or greater than 60 to demonstrate statistically significant improvement in mobility and quality of life.    Baseline 4/7: 51    Time 12    Period Weeks    Status New    Target Date 11/16/20      PT LONG TERM GOAL #2   Title Patient (< 26 years old) will complete five times sit to stand test in < 10 seconds indicating an increased LE strength and improved balance.    Baseline 4/7: 20.8 sec    Time 12    Period Weeks    Status New    Target Date 11/16/20      PT LONG TERM GOAL #3   Title Patient will report a worst L shoulder pain of 5/10 on VAS in last seven days to improve tolerance with ADLs and reduced symptoms with activities.    Baseline 4/7: worst pain 10/10    Time 12    Period Weeks    Status New    Target Date 11/16/20      PT LONG TERM GOAL #4   Title Patient will reduce modified Oswestry score to <20 as to demonstrate minimal disability with ADLs including improved sleeping tolerance, walking/sitting tolerance etc for better mobility with ADLs.    Baseline 4/7: 32%    Time 12    Period Weeks    Status New    Target Date 11/16/20      PT LONG TERM GOAL #5   Title Patient will decrease Quick DASH score by > 8 points demonstrating reduced self-reported upper extremity disability.    Baseline 4/7: 52%    Time 12    Period Weeks    Status New    Target Date 11/16/20      Additional Long Term Goals   Additional Long Term Goals Yes      PT LONG TERM GOAL #6   Title Pt will demonstrate an increase in B hip flexor, B hip abductor, and B knee flexor strength by at least 1/2 point on the MMT to indicate improved strength to increase ease with all functional mobility.    Baseline 4/7: B hip flexion 4/5, B hip abductoin 4/5, B knee flexion 4/5    Time 12    Period Weeks    Status New    Target Date 11/16/20      PT LONG TERM GOAL #7   Title Patient will reduce timed up and go to <11 seconds to reduce fall risk and demonstrate  improved transfer/gait ability.    Baseline 4/11: 15.5 sec    Time 11    Period Weeks    Status New    Target Date 11/16/20                 Plan - 09/04/20 1713    Clinical Impression Statement Pt challenged with static and dynamic balance tasks, with one instance of LOB requiring CGA and UE support to regain balance. She has difficulty with SLB on RLE but not LLE. The pt will benefit from further skilled therapy to improve pain, BLE strength, and balance to improve QOL.    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, polyneuropathy, smokes, R TKR (02/01/2015?), L TKA (02/10/2019)    Examination-Activity Limitations Bathing;Bed Mobility;Bend;Caring for Others;Carry;Dressing;Hygiene/Grooming;Lift;Locomotion Level;Reach Overhead;Self Feeding;Sit;Sleep;Squat;Stairs;Stand;Toileting;Transfers    Examination-Participation Restrictions Cleaning;Laundry;Yard Work;Medication Management;Community Activity;Driving;Meal Prep;Shop;Volunteer    Stability/Clinical Decision Making Evolving/Moderate complexity    Rehab Potential Fair    PT Frequency 2x / week    PT Duration 12 weeks    PT Treatment/Interventions ADLs/Self Care Home Management;Aquatic Therapy;Biofeedback;Cryotherapy;Electrical Stimulation;Moist Heat;Traction;Ultrasound;Parrafin;DME Instruction;Gait training;Stair training;Functional mobility training;Therapeutic activities;Therapeutic exercise;Balance training;Neuromuscular re-education;Patient/family education;Orthotic Fit/Training;Manual techniques;Wheelchair  mobility training;Passive range of motion;Dry needling;Energy conservation;Taping;Splinting;Joint Manipulations    PT Next Visit Plan Further balance assessment, initiate HEP and strengthening and mobility exercises; nerve flossing LLE, practice meal-prep in kitchen/body mechanics, continue plan as previously indicated    PT Home Exercise Plan access code: [email or web address]    Consulted and Agree with Plan of Care Patient           Patient will benefit from skilled therapeutic intervention in order to improve the following deficits and impairments:  Abnormal gait,Decreased activity tolerance,Decreased endurance,Decreased knowledge of use of DME,Decreased range of motion,Decreased strength,Hypomobility,Increased fascial restricitons,Impaired sensation,Impaired UE functional use,Improper body mechanics,Pain,Cardiopulmonary status limiting activity,Decreased balance,Decreased coordination,Decreased mobility,Difficulty walking,Increased muscle spasms,Impaired flexibility,Postural dysfunction,Obesity  Visit Diagnosis: Unsteadiness on feet  Muscle weakness (generalized)  Other abnormalities of gait and mobility     Problem List Patient Active Problem List   Diagnosis Date Noted  . Noncompliance with treatment regimen 05/01/2020  . Pharmacologic therapy 11/30/2019  . Bipolar disorder, in full remission, most recent episode mixed (HCC) 11/15/2019  . Bipolar 1 disorder, mixed, moderate (HCC) 06/28/2019  . High risk medication use 06/23/2019  . Bipolar disorder, in full remission, most recent episode depressed (HCC) 06/09/2019  . Total knee replacement status 02/10/2019  . Abnormal ECG 02/09/2019  . Panic attacks 02/03/2019  . GAD (generalized anxiety disorder) 11/25/2018  . Cannabis use disorder, moderate, dependence (HCC) 11/25/2018  . Tobacco use disorder 11/25/2018  . Chronic musculoskeletal pain 07/29/2018  . Hypercholesteremia 08/26/2017  . 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 Chronic sacroiliac joint pain (Primary Source of Pain) (Bilateral) (L>R) 07/09/2016  . Lumbar facet syndrome (Bilateral) (L>R) 07/09/2016  . Neurogenic pain 07/09/2016  . Osteoarthritis 07/09/2016  . Chronic knee pain after total knee replacement (Right) 07/09/2016  . Marijuana use 06/25/2016  . Bipolar I disorder, most recent episode depressed (HCC) 05/06/2016  . BP (high blood pressure) 05/06/2016  . Irregular cardiac rhythm 05/06/2016  . Extreme obesity 05/06/2016  . Long term current use of anticoagulant therapy (Lovenox) 05/06/2016  . Long term current use of opiate analgesic 05/06/2016  . Long term prescription opiate use 05/06/2016  . Opiate use 05/06/2016  . Opioid dependence, uncomplicated (HCC) 05/06/2016  . Chronic hip pain (Primary Area of Pain) (Bilateral) (L>R) 05/06/2016  . Chronic neck pain (Bilateral) (L>R) 05/06/2016  . Chronic knee pain (Bilateral) (L>R) 05/06/2016  . Radiculopathy of cervical region (B)(R>L)(Thumb + Index fingers) 12/21/2015  . Chronic shoulder pain (Third area of Pain) (Bilateral) (L>R) 12/21/2015  . S/P TKR Arthroplasty (Right) 07/13/2015  . Osteoarthritis of knee (Left) 11/17/2014  . DDD (degenerative disc disease), lumbar 08/30/2014  . Lumbar canal stenosis (L3-4 and L4-5) 05/23/2014  . Anxiety and depression 04/25/2014  . Fibromyalgia 04/25/2014  . Personal history of perinatal problems 04/25/2014  . Chronic low back pain (Secondary area of Pain) (Bilateral) (L>R) 04/25/2014  . Vitamin D  deficiency 04/25/2014  . H/O syncope 04/25/2014  . Hyperlipidemia 04/25/2014  . Breathlessness on exertion 04/20/2014  . PVC's (premature ventricular contractions) 04/08/2014  . Asthma 03/26/2014  . Cardiac conduction disorder 03/26/2014  . Hypercholesterolemia without hypertriglyceridemia 03/26/2014  . Chronic pain syndrome 12/30/2013  . Cervical post-laminectomy syndrome (Anterior-posterior approach) (2014) 12/30/2013  . Major depressive  disorder, single episode 05/08/2013  . Depression 05/08/2013  . Thoracic or lumbosacral neuritis or radiculitis 05/07/2012   Temple Pacini PT, DPT 09/04/2020, 5:16 PM  [place] [facility] MAIN [facility] SERVICES [address] [place], [place], [address] Phone: [phone]   Fax:  [phone]  Name: Shelly Wright MRN: [id number] Date of Birth: 1970/10/12

## 2020-09-06 ENCOUNTER — Ambulatory Visit: Payer: Medicaid Other

## 2020-09-06 ENCOUNTER — Other Ambulatory Visit: Payer: Self-pay

## 2020-09-06 DIAGNOSIS — M6281 Muscle weakness (generalized): Secondary | ICD-10-CM

## 2020-09-06 DIAGNOSIS — R2689 Other abnormalities of gait and mobility: Secondary | ICD-10-CM

## 2020-09-06 DIAGNOSIS — G8929 Other chronic pain: Secondary | ICD-10-CM

## 2020-09-06 DIAGNOSIS — M7918 Myalgia, other site: Secondary | ICD-10-CM

## 2020-09-06 DIAGNOSIS — M545 Low back pain, unspecified: Secondary | ICD-10-CM | POA: Diagnosis not present

## 2020-09-06 NOTE — Therapy (Signed)
[place] [facility] MAIN [facility] SERVICES [address] [place], [place], [address] Phone: [phone]   Fax:  [phone]  Physical Therapy Treatment  Patient Details  Name: Shelly Wright MRN: [id number] Date of Birth: 11/17/1970 Referring Provider (PT): Miki Kins, FNP   Encounter Date: 09/06/2020   PT End of Session - 09/06/20 1733    Visit Number 5    Number of Visits 25    Date for PT Re-Evaluation 11/16/20    Authorization Type eval performed 08/24/2020    PT Start Time 1516    PT Stop Time 1559    PT Time Calculation (min) 43 min    Equipment Utilized During Treatment Gait belt    Activity Tolerance Patient tolerated treatment well    Behavior During Therapy [facility] for tasks assessed/performed           Past Medical History:  Diagnosis Date  . Acute right-sided low back pain with right-sided sciatica 08/30/2014  . Anxiety   . Arthritis   . Asthma    in past, no current inhalers  . Bipolar disorder (HCC)   . Cervical spinal cord compression (HCC) 05/07/2012  . COPD (chronic obstructive pulmonary disease) (HCC)   . DDD (degenerative disc disease), lumbar   . Depression   . Dyspnea    with exertion  . Dysrhythmia    irregular-h/o in the past  . Fibromyalgia   . Hypertension   . Neuritis or radiculitis due to rupture of lumbar intervertebral disc 05/23/2014  . Periprosthetic fracture around prosthetic knee 07/31/2015  . Restless leg syndrome   . Soft tissue lesion of shoulder region 01/24/2012  . 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 Syncope and collapse 04/08/2014  . Thoracic and lumbosacral neuritis 05/07/2012  . Thoracic neuritis 05/07/2012    Past Surgical History:  Procedure Laterality Date  . ABDOMINAL HYSTERECTOMY    . Cervical Fusion X 2    . JOINT REPLACEMENT Right 01/2016   Dr Gavin Potters  . KNEE ARTHROPLASTY Left 02/10/2019   Procedure:  COMPUTER ASSISTED TOTAL KNEE ARTHROPLASTY - RNFA;  Surgeon: Donato Heinz, MD;  Location: [facility] ORS;  Service: Orthopedics;  Laterality: Left;  . KNEE ARTHROSCOPY Bilateral    Right knee scope 1998, Left knee scope  . TONSILLECTOMY    . TOTAL KNEE ARTHROPLASTY Right 02/01/2015   Procedure: TOTAL KNEE ARTHROPLASTY;  Surgeon: Erin Sons, MD;  Location: [facility] ORS;  Service: Orthopedics;  Laterality: Right;  . TUBAL LIGATION      There were no vitals filed for this visit.   Subjective Assessment - 09/06/20 1731    Subjective Pt says she had some leg pain after last session. She reports continued back and hip pain.    Patient is accompained by: Family member   granddaughter (3 y/o)   Pertinent History Pt is a 50 y/o female who presents to PT in transport chair and with SPC. Pt's 3 y/o granddaughter is with her.  The pt reports she has had LBP and B shoulder pain, where L shoulder hurts worse than R, for over ten years. The pt reports she had neck surgery in the 90s, after which her shoulder pain started. She states "it kills me" to raise her shoulders overhead. She reports B shoulder pain is currently 3/10, worst pain reaches 10/10, and best pain reaches 2/10. She says pain is constant. Lifting her arms makes it  worse and is affecting her ability to perform ADLs. The pt says she has not found anything that helps the pain. She is taking gabapentin but reports it has not helped her pain. Pt reports her low back pain travels down L buttock and L LE. Pt's LBP is constant. Pt says sleeping with her "feet up" and rest helps her back pain. She says moving makes it worse, including standing and walking, and leaning backward. Pt sits down to cook and "put the dishes away" due to her back pain. The pt reports she has hx of scoliosis, and disc herniations. She states she has N/T in hands and feet. Denies and B/B changes. The pt has never had PT. The pt has shower chair and SPC, but no other equipment in her home.  The pt reports she lives by herself. 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 Pt reports she is able to sit comfortably for maybe an hour    How long can you stand comfortably? 5-10 minutes    How long can you walk comfortably? 5-10 minutes    Diagnostic tests MRI lumbar spine without contrast 03/01/2020 per chart: "FINDINGS:   Conus medullaris: terminates at approximately L1.   Alignment: Normal.   Marrow Signal: No suspicious lesions.   Sacroiliac Joints: No significant degenerative changes of the visualized SI   joints     Regional Soft Tissues: Unremarkable.     T11-T12: Left sided disc protrusion with moderate bilateral facet   arthropathy and asymmetric right greater than left hypertrophy of the   ligamenta flava resulting in moderate spinal canal stenosis with moderate   spinal canal stenosis and slight deformity of the left ventral aspect of   the spinal cord and right dorsal aspect of the spinal cord. Moderate   bilateral neuroforaminal stenosis.   T12-L1: No significant spinal canal or neuroforaminal stenosis.     L1-L2: Disc bulge with mild bilateral facet arthropathy and hypertrophy of   the ligamenta flava resulting in mild spinal canal stenosis. Mild bilateral   facet arthropathy.   L2-L3: Disc bulge with mild bilateral facet arthropathy and hypertrophy of   the ligamenta flava resulting in moderate spinal canal stenosis. Mild right   neuroforaminal stenosis. Moderate bilateral facet arthropathy.  L3-L4: Disc bulge with loss of disc height and Schmorl's nodes. In   conjunction  with moderate bilateral facet arthropathy and hypertrophy of   the ligamenta flava, this results in severe spinal canal stenosis with   crowding of the cauda equina nerve roots. Severe right neuroforaminal   stenosis. Moderate bilateral facet arthropathy.   L4-L5: Grade 1 anterolisthesis in the setting of severe bilateral facet   arthropathy. In conjunction with a disc bulge, there is severe spinal canal   stenosis with crowding of the cauda equina nerve roots. Severe left and   mild right neuroforaminal stenosis. Severe left and moderate right facet   arthropathy.   L5-S1: No significant spinal canal or neuroforaminal stenosis. Moderate   bilateral facet arthropathy.       IMPRESSION:   Multilevel lumbar spondylosis, as above, resulting in severe spinal canal   stenosis with  crowding of the cauda equina nerve roots at L3-L4 and L4-L5. ";  XRAY hip B 3 or 4 veiws with or without pelvis 02/28/2020 per chart: "Radiographs taken today, 4 views of bilateral hips, demonstrate   well-preserved joint spaces of both the left and right hip. No other bony   or soft tissue abnormality is noted.  "    Patient Stated Goals Pt states "I want to do what I want to be able to do." Pt would like to be able to stand and walk comfortably.    Currently in Pain? Yes    Pain Onset More than a month ago    Pain Onset More than a month ago         TREATMENT  THEREX:   Hot pack applied to L posterior shoulder musculature x 5 minutes. Skin checked before and after removal of hot pack with no adverse reaction observed and no adverse reaction reported to tx.  Supine exercises:  TA contraction with posterior pelvic tilt 15x; VC/TC, demo for technique  TA contraction, posterior pelvic tilt with marches 1x10 with 3 sec holds; attempted but pt rates exercise is hard and pulls on her back. Discontinued due to increased back pain that resolved with discontinuation of exercise.   TA contraction, posterior pelvic tilt and adductor  squeeze 2x10 (bolster removed); VC for technique  Adductor squeezes 1x10 without pelvic tilt and pt reports improvement in sx from other version of exercise (with pelvic tilt).  Supine Glute bridges 3x10; pt reports no pain with exercise Attempted with glute squeeze at end range, but pt reports RLE pain. Pt instructed to discontinue glute-squeeze and pt was able to perform exercise pain free.  Nerve flossing (sciatic) 10x each LE; VC/demo for technique  Physioball hamstring curls, PT-assisted - 3x15; VC for technique. Pt reports no pain  SLR 1x8 BLEs, 2x10 BLEs. VC/TC for technique.   Assessment of thoracic-sacral vertebrae: pt TTP around approx. T6-T8 and tender throughout lumbar spine and superior sacrum.  Physioball rollouts forward/backward with 2-3 sec hold 10x  Seated, YTB rows 2x20 pt rates medium-hard  Seated, YTB ER pull-aparts 20x difficulty with R UE, required hands-on assist from PT for proper technique.   Education provided throughout session in the form of VC/TC and demonstration to facilitate movement at target joints and correct muscle activation with exercises. The pt demonstrated good carryover within session following cuing.     PT Short Term Goals - 08/24/20 1633      PT SHORT TERM GOAL #1   Title Patient will be independent in home exercise program to improve strength/mobility for better functional independence with ADLs.    Baseline 4/7: to be initiated next appointment    Time 6    Period Weeks    Status New    Target Date 10/05/20      PT SHORT TERM GOAL #2   Title Pt will demonstrate improvement in B shoulder abduction by at least 20 degrees in order to improve ease with pt washing her hair.    Baseline 4/7: L shoulder abduction 120 deg., R shoulder abduction 100 deg.    Time 6    Period Weeks    Status New    Target Date 10/05/20             PT Long Term Goals - 08/28/20 1717      PT LONG TERM GOAL #1   Title Patient will increase FOTO  score to equal to or greater than  60 to demonstrate statistically significant improvement in mobility and quality of life.    Baseline 4/7: 51    Time 12    Period Weeks    Status New    Target Date 11/16/20      PT LONG TERM GOAL #2   Title Patient (< 43 years old) will complete five times sit to stand test in < 10 seconds indicating an increased LE strength and improved balance.    Baseline 4/7: 20.8 sec    Time 12    Period Weeks    Status New    Target Date 11/16/20      PT LONG TERM GOAL #3   Title Patient will report a worst L shoulder pain of 5/10 on VAS in last seven days to improve tolerance with ADLs and reduced symptoms with activities.    Baseline 4/7: worst pain 10/10    Time 12    Period Weeks    Status New    Target Date 11/16/20      PT LONG TERM GOAL #4   Title Patient will reduce modified Oswestry score to <20 as to demonstrate minimal disability with ADLs including improved sleeping tolerance, walking/sitting tolerance etc for better mobility with ADLs.    Baseline 4/7: 32%    Time 12    Period Weeks    Status New    Target Date 11/16/20      PT LONG TERM GOAL #5   Title Patient will decrease Quick DASH score by > 8 points demonstrating reduced self-reported upper extremity disability.    Baseline 4/7: 52%    Time 12    Period Weeks    Status New    Target Date 11/16/20      Additional Long Term Goals   Additional Long Term Goals Yes      PT LONG TERM GOAL #6   Title Pt will demonstrate an increase in B hip flexor, B hip abductor, and B knee flexor strength by at least 1/2 point on the MMT to indicate improved strength to increase ease with all functional mobility.    Baseline 4/7: B hip flexion 4/5, B hip abductoin 4/5, B knee flexion 4/5    Time 12    Period Weeks    Status New    Target Date 11/16/20      PT LONG TERM GOAL #7   Title Patient will reduce timed up and go to <11 seconds to reduce fall risk and demonstrate improved transfer/gait  ability.    Baseline 4/11: 15.5 sec    Time 11    Period Weeks    Status New    Target Date 11/16/20            Plan - 09/06/20 1738    Clinical Impression Statement Palpation assessment performed of thoracic and lumbar spine to sacrum. Pt TTP around T6-T8 and throughout all of lumbar spine and superior sacrum. Additionally, pt has decreased pain symptoms in her back and LEs when performing glute bridges and adductor squeezes without posterior pelvic tilt. Part of PT session also focused on pt B shoulder strengthening. Pt exhibits decreased ability to perform R shoulder ER with resistance compared to L side, requiring hands-on assist. The pt will benefit from further skilled therapy to improve pain, BLE strength and BUE strength to improve QOL.    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, polyneuropathy, smokes, R TKR (02/01/2015?), L TKA (02/10/2019)    Examination-Activity Limitations Bathing;Bed Mobility;Bend;Caring for Others;Carry;Dressing;Hygiene/Grooming;Lift;Locomotion Level;Reach Overhead;Self Feeding;Sit;Sleep;Squat;Stairs;Stand;Toileting;Transfers    Examination-Participation Restrictions Cleaning;Laundry;Yard Work;Medication Management;Community Activity;Driving;Meal Prep;Shop;Volunteer    Stability/Clinical Decision Making Evolving/Moderate complexity    Rehab Potential Fair    PT Frequency 2x / week    PT Duration 12 weeks    PT Treatment/Interventions ADLs/Self Care Home Management;Aquatic Therapy;Biofeedback;Cryotherapy;Electrical  Stimulation;Moist Heat;Traction;Ultrasound;Parrafin;DME Instruction;Gait training;Stair training;Functional mobility training;Therapeutic activities;Therapeutic exercise;Balance training;Neuromuscular re-education;Patient/family education;Orthotic Fit/Training;Manual techniques;Wheelchair mobility training;Passive range of motion;Dry needling;Energy conservation;Taping;Splinting;Joint Manipulations    PT Next Visit Plan Further balance assessment, initiate HEP and strengthening and mobility exercises; nerve flossing LLE, practice meal-prep in kitchen/body mechanics, continue plan as previously indicated    PT Home Exercise Plan access code: [email or web address]    Consulted and Agree with Plan of Care Patient           Patient will benefit from skilled therapeutic intervention in order to improve the following deficits and impairments:  Abnormal gait,Decreased activity tolerance,Decreased endurance,Decreased knowledge of use of DME,Decreased range of motion,Decreased strength,Hypomobility,Increased fascial restricitons,Impaired sensation,Impaired UE functional use,Improper body mechanics,Pain,Cardiopulmonary status limiting activity,Decreased balance,Decreased coordination,Decreased mobility,Difficulty walking,Increased muscle spasms,Impaired flexibility,Postural dysfunction,Obesity  Visit Diagnosis: Other abnormalities of gait and mobility  Muscle weakness (generalized)  Chronic musculoskeletal pain     Problem List Patient Active Problem List   Diagnosis Date Noted  . Noncompliance with treatment regimen 05/01/2020  . Pharmacologic therapy 11/30/2019  . Bipolar disorder, in full remission, most recent episode mixed (HCC) 11/15/2019  . Bipolar 1 disorder, mixed, moderate (HCC) 06/28/2019  . High risk medication use 06/23/2019  . Bipolar disorder, in full remission, most recent episode depressed (HCC) 06/09/2019  . Total knee replacement status 02/10/2019  . Abnormal ECG 02/09/2019  . Panic  attacks 02/03/2019  . GAD (generalized anxiety disorder) 11/25/2018  . Cannabis use disorder, moderate, dependence (HCC) 11/25/2018  . Tobacco use disorder 11/25/2018  . Chronic musculoskeletal pain 07/29/2018  . Hypercholesteremia 08/26/2017  . 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 Chronic sacroiliac joint pain (Primary Source of Pain) (Bilateral) (L>R) 07/09/2016  . Lumbar facet syndrome (Bilateral) (L>R) 07/09/2016  . Neurogenic pain 07/09/2016  . Osteoarthritis 07/09/2016  . Chronic knee pain after total knee replacement (Right) 07/09/2016  . Marijuana use 06/25/2016  . Bipolar I disorder, most recent episode depressed (HCC) 05/06/2016  . BP (high blood pressure) 05/06/2016  . Irregular cardiac rhythm 05/06/2016  . Extreme obesity 05/06/2016  . Long term current use of anticoagulant therapy (Lovenox) 05/06/2016  . Long term current use of opiate analgesic 05/06/2016  . Long term prescription opiate use 05/06/2016  . Opiate use 05/06/2016  . Opioid dependence, uncomplicated (HCC) 05/06/2016  . Chronic hip pain (Primary Area of Pain) (Bilateral) (L>R) 05/06/2016  . Chronic neck pain (Bilateral) (L>R) 05/06/2016  . Chronic knee pain (Bilateral) (L>R) 05/06/2016  . Radiculopathy of cervical region (B)(R>L)(Thumb + Index fingers) 12/21/2015  . Chronic shoulder pain (Third area of Pain) (Bilateral) (L>R) 12/21/2015  . S/P TKR Arthroplasty (Right) 07/13/2015  . Osteoarthritis of knee (Left) 11/17/2014  . DDD (degenerative disc disease), lumbar 08/30/2014  . Lumbar canal stenosis (L3-4 and L4-5) 05/23/2014  . Anxiety and depression 04/25/2014  . Fibromyalgia 04/25/2014  . Personal history of perinatal problems 04/25/2014  . Chronic low back pain (Secondary area of Pain) (  Bilateral) (L>R) 04/25/2014  . Vitamin D deficiency 04/25/2014  . H/O syncope 04/25/2014  . Hyperlipidemia 04/25/2014  . Breathlessness on exertion 04/20/2014  . PVC's (premature ventricular  contractions) 04/08/2014  . Asthma 03/26/2014  . Cardiac conduction disorder 03/26/2014  . Hypercholesterolemia without hypertriglyceridemia 03/26/2014  . Chronic pain syndrome 12/30/2013  . Cervical post-laminectomy syndrome (Anterior-posterior approach) (2014) 12/30/2013  . Major depressive disorder, single episode 05/08/2013  . Depression 05/08/2013  . Thoracic or lumbosacral neuritis or radiculitis 05/07/2012   Temple Pacini PT, DPT 09/06/2020, 5:45 PM   [facility] MAIN [facility] SERVICES [address] [place], [place], [address] Phone: [phone]   Fax:  [phone]  Name: Shelly Wright MRN: [id number] Date of Birth: [birth year]

## 2020-09-08 DIAGNOSIS — R32 Unspecified urinary incontinence: Secondary | ICD-10-CM | POA: Diagnosis not present

## 2020-09-11 ENCOUNTER — Ambulatory Visit: Payer: Medicaid Other

## 2020-09-11 ENCOUNTER — Other Ambulatory Visit: Payer: Self-pay

## 2020-09-11 DIAGNOSIS — M25511 Pain in right shoulder: Secondary | ICD-10-CM

## 2020-09-11 DIAGNOSIS — M25512 Pain in left shoulder: Secondary | ICD-10-CM

## 2020-09-11 DIAGNOSIS — M545 Low back pain, unspecified: Secondary | ICD-10-CM | POA: Diagnosis not present

## 2020-09-11 DIAGNOSIS — G8929 Other chronic pain: Secondary | ICD-10-CM

## 2020-09-11 DIAGNOSIS — M6281 Muscle weakness (generalized): Secondary | ICD-10-CM

## 2020-09-11 DIAGNOSIS — R2689 Other abnormalities of gait and mobility: Secondary | ICD-10-CM

## 2020-09-11 NOTE — Therapy (Signed)
[facility] MAIN [facility] SERVICES [address] [place], [place], [address] Phone: [phone]   Fax:  [phone]  Physical Therapy Treatment  Patient Details  Name: Shelly Wright MRN: [id number] Date of Birth: 01-13-1971 Referring Provider (PT): Miki Kins, FNP   Encounter Date: 09/11/2020   PT End of Session - 09/11/20 1650    Visit Number 6    Number of Visits 25    Date for PT Re-Evaluation 11/16/20    Authorization Type eval performed 08/24/2020    PT Start Time 1526    PT Stop Time 1600    PT Time Calculation (min) 34 min    Equipment Utilized During Treatment Gait belt    Activity Tolerance Patient tolerated treatment well    Behavior During Therapy [employer] for tasks assessed/performed           Past Medical History:  Diagnosis Date  . Acute right-sided low back pain with right-sided sciatica 08/30/2014  . Anxiety   . Arthritis   . Asthma    in past, no current inhalers  . Bipolar disorder (HCC)   . Cervical spinal cord compression (HCC) 05/07/2012  . COPD (chronic obstructive pulmonary disease) (HCC)   . DDD (degenerative disc disease), lumbar   . Depression   . Dyspnea    with exertion  . Dysrhythmia    irregular-h/o in the past  . Fibromyalgia   . Hypertension   . Neuritis or radiculitis due to rupture of lumbar intervertebral disc 05/23/2014  . Periprosthetic fracture around prosthetic knee 07/31/2015  . Restless leg syndrome   . Soft tissue lesion of shoulder region 01/24/2012  . 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 Syncope and collapse 04/08/2014  . Thoracic and lumbosacral neuritis 05/07/2012  . Thoracic neuritis 05/07/2012    Past Surgical History:  Procedure Laterality Date  . ABDOMINAL HYSTERECTOMY    . Cervical Fusion X 2    . JOINT REPLACEMENT Right 01/2016   Dr Gavin Potters  . KNEE ARTHROPLASTY Left 02/10/2019   Procedure:  COMPUTER ASSISTED TOTAL KNEE ARTHROPLASTY - RNFA;  Surgeon: Donato Heinz, MD;  Location: [facility] ORS;  Service: Orthopedics;  Laterality: Left;  . KNEE ARTHROSCOPY Bilateral    Right knee scope 1998, Left knee scope  . TONSILLECTOMY    . TOTAL KNEE ARTHROPLASTY Right 02/01/2015   Procedure: TOTAL KNEE ARTHROPLASTY;  Surgeon: Erin Sons, MD;  Location: [facility] ORS;  Service: Orthopedics;  Laterality: Right;  . TUBAL LIGATION      There were no vitals filed for this visit.   Subjective Assessment - 09/11/20 1649    Subjective Pt reports LBP currently 4/10. She says she did a lot of work yesterday bending and lifting and that her back pain was bad.    Patient is accompained by: Family member   granddaughter (3 y/o)   Pertinent History Pt is a 50 y/o female who presents to PT in transport chair and with SPC. Pt's 3 y/o granddaughter is with her.  The pt reports she has had LBP and B shoulder pain, where L shoulder hurts worse than R, for over ten years. The pt reports she had neck surgery in the 90s, after which her shoulder pain started. She states "it kills me" to raise her shoulders overhead. She reports B shoulder pain is currently 3/10, worst pain reaches 10/10, and best pain reaches 2/10. She says pain  is constant. Lifting her arms makes it worse and is affecting her ability to perform ADLs. The pt says she has not found anything that helps the pain. She is taking gabapentin but reports it has not helped her pain. Pt reports her low back pain travels down L buttock and L LE. Pt's LBP is constant. Pt says sleeping with her "feet up" and rest helps her back pain. She says moving makes it worse, including standing and walking, and leaning backward. Pt sits down to cook and "put the dishes away" due to her back pain. The pt reports she has hx of scoliosis, and disc herniations. She states she has N/T in hands and feet. Denies and B/B changes. The pt has never had PT. The pt has shower chair and SPC, but  no other equipment in her home. The pt reports she lives by herself. 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 Pt reports she is able to sit comfortably for maybe an hour    How long can you stand comfortably? 5-10 minutes    How long can you walk comfortably? 5-10 minutes    Diagnostic tests MRI lumbar spine without contrast 03/01/2020 per chart: "FINDINGS:   Conus medullaris: terminates at approximately L1.   Alignment: Normal.   Marrow Signal: No suspicious lesions.   Sacroiliac Joints: No significant degenerative changes of the visualized SI   joints     Regional Soft Tissues: Unremarkable.     T11-T12: Left sided disc protrusion with moderate bilateral facet   arthropathy and asymmetric right greater than left hypertrophy of the   ligamenta flava resulting in moderate spinal canal stenosis with moderate   spinal canal stenosis and slight deformity of the left ventral aspect of   the spinal cord and right dorsal aspect of the spinal cord. Moderate   bilateral neuroforaminal stenosis.   T12-L1: No significant spinal canal or neuroforaminal stenosis.     L1-L2: Disc bulge with mild bilateral facet arthropathy and hypertrophy of   the ligamenta flava resulting in mild spinal canal stenosis. Mild bilateral   facet arthropathy.   L2-L3: Disc bulge with mild bilateral facet arthropathy and hypertrophy of   the ligamenta flava resulting in moderate spinal canal stenosis. Mild right   neuroforaminal stenosis. Moderate bilateral facet arthropathy.  L3-L4: Disc bulge with loss of disc height and  Schmorl's nodes. In   conjunction with moderate bilateral facet arthropathy and hypertrophy of   the ligamenta flava, this results in severe spinal canal stenosis with   crowding of the cauda equina nerve roots. Severe right neuroforaminal   stenosis. Moderate bilateral facet arthropathy.   L4-L5: Grade 1 anterolisthesis in the setting of severe bilateral facet   arthropathy. In conjunction with a disc bulge, there is severe spinal canal   stenosis with crowding of the cauda equina nerve roots. Severe left and   mild right neuroforaminal stenosis. Severe left and moderate right facet   arthropathy.   L5-S1: No significant spinal canal or neuroforaminal stenosis. Moderate   bilateral facet arthropathy.       IMPRESSION:   Multilevel lumbar spondylosis, as above, resulting in  severe spinal canal   stenosis with crowding of the cauda equina nerve roots at L3-L4 and L4-L5. ";  XRAY hip B 3 or 4 veiws with or without pelvis 02/28/2020 per chart: "Radiographs taken today, 4 views of bilateral hips, demonstrate   well-preserved joint spaces of both the left and right hip. No other bony   or soft tissue abnormality is noted.  "    Patient Stated Goals Pt states "I want to do what I want to be able to do." Pt would like to be able to stand and walk comfortably.    Currently in Pain? Yes    Pain Score 4     Pain Location Back    Pain Orientation Left;Right    Pain Onset More than a month ago    Pain Onset More than a month ago         Manual  Pt prone PAs and L UPAs applied along T4-T6, L1-5 x multiple reps for each in 30 sec bouts grade I-II. Pt is TTP throughout, with peripheralizing of sx around L5 with L UPA (discontinued mobs in area and sx resolved). Followed by STM to L and R lumbar paraspinals, with worse pain reported on L>R x 10 min.  Therex Prone press-up 2x10 from forearms; Demo/vc/tc for technique. Pt progressed ROM with reports of mild low back ache. Cued to perform movement within pain-free  range.  RTB rows -n3x15; VC/demo for technique.  LUE ER with RTB -1x10; pt reports some increase in shoulder discomfort  RUE ER without resistance 2x10, followed by LUE ER without resistance 2x10  Chin tucks with manual resistance 1x10 with 3 sec hold; Demo/VC/TC for technique    Education provided via VC/TC/demonstration to facilitate improved muscle contraction and movement at target joints with all exercise.      Plan - 09/11/20 1650    Clinical Impression Statement PT focused large part of session on PAs, L UPAs, and STM to lumbar paraspinals. Pt is TTP throughout lumbar spine. With LUPA at approx. L5 pt reported peripheralization of sx down posterior R LE which resolved with discontinuing UPA/PA in that region. Pt more tender on L side compred to R of lumbar spine and around lumbar paraspinals. Pt performed prone press-up following manual therapy interventions with only mild report of ache in low back. However, upon coming back to sitting pt reported brief shart pain in low back that quickly resolved. The pt will benefit from further skilled therapy to address pain, increase BLE strength and BUE strength to improve functional mobility and QOL.    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, polyneuropathy, smokes, R TKR (02/01/2015?), L TKA (02/10/2019)    Examination-Activity Limitations Bathing;Bed Mobility;Bend;Caring for Others;Carry;Dressing;Hygiene/Grooming;Lift;Locomotion Level;Reach Overhead;Wright Feeding;Sit;Sleep;Squat;Stairs;Stand;Toileting;Transfers     Examination-Participation Restrictions Cleaning;Laundry;Yard Work;Medication Management;Community Activity;Driving;Meal Prep;Shop;Volunteer    Stability/Clinical Decision Making Evolving/Moderate complexity    Rehab Potential Fair    PT Frequency 2x / week    PT Duration 12 weeks    PT Treatment/Interventions ADLs/Wright Care Home Management;Aquatic Therapy;Biofeedback;Cryotherapy;Electrical Stimulation;Moist Heat;Traction;Ultrasound;Parrafin;DME Instruction;Gait training;Stair training;Functional mobility training;Therapeutic activities;Therapeutic exercise;Balance training;Neuromuscular re-education;Patient/family education;Orthotic Fit/Training;Manual techniques;Wheelchair mobility training;Passive range of motion;Dry needling;Energy conservation;Taping;Splinting;Joint Manipulations    PT Next Visit Plan Further balance assessment, initiate HEP and strengthening and  mobility exercises; nerve flossing LLE, practice meal-prep in kitchen/body mechanics, continue plan as previously indicated    PT Home Exercise Plan access code: [email or web address]    Consulted and Agree with Plan of Care Patient             PT Short Term Goals - 08/24/20 1633      PT SHORT TERM GOAL #1   Title Patient will be independent in home exercise program to improve strength/mobility for better functional independence with ADLs.    Baseline 4/7: to be initiated next appointment    Time 6    Period Weeks    Status New    Target Date 10/05/20      PT SHORT TERM GOAL #2   Title Pt will demonstrate improvement in B shoulder abduction by at least 20 degrees in order to improve ease with pt washing her hair.    Baseline 4/7: L shoulder abduction 120 deg., R shoulder abduction 100 deg.    Time 6    Period Weeks    Status New    Target Date 10/05/20             PT Long Term Goals - 08/28/20 1717      PT LONG TERM GOAL #1   Title Patient will increase FOTO score to equal to or greater than 60 to demonstrate statistically  significant improvement in mobility and quality of life.    Baseline 4/7: 51    Time 12    Period Weeks    Status New    Target Date 11/16/20      PT LONG TERM GOAL #2   Title Patient (< 62 years old) will complete five times sit to stand test in < 10 seconds indicating an increased LE strength and improved balance.    Baseline 4/7: 20.8 sec    Time 12    Period Weeks    Status New    Target Date 11/16/20      PT LONG TERM GOAL #3   Title Patient will report a worst L shoulder pain of 5/10 on VAS in last seven days to improve tolerance with ADLs and reduced symptoms with activities.    Baseline 4/7: worst pain 10/10    Time 12    Period Weeks    Status New    Target Date 11/16/20      PT LONG TERM GOAL #4   Title Patient will reduce modified Oswestry score to <20 as to demonstrate minimal disability with ADLs including improved sleeping tolerance, walking/sitting tolerance etc for better mobility with ADLs.    Baseline 4/7: 32%    Time 12    Period Weeks    Status New    Target Date 11/16/20      PT LONG TERM GOAL #5   Title Patient will decrease Quick DASH score by > 8 points demonstrating reduced Wright-reported upper extremity disability.    Baseline 4/7: 52%    Time 12    Period Weeks    Status New    Target Date 11/16/20      Additional Long Term Goals   Additional Long Term Goals Yes      PT LONG TERM GOAL #6   Title Pt will demonstrate an increase in B hip flexor, B hip abductor, and B knee flexor strength by at least 1/2 point on the MMT to indicate improved strength to increase ease with all functional mobility.    Baseline 4/7: B hip  flexion 4/5, B hip abductoin 4/5, B knee flexion 4/5    Time 12    Period Weeks    Status New    Target Date 11/16/20      PT LONG TERM GOAL #7   Title Patient will reduce timed up and go to <11 seconds to reduce fall risk and demonstrate improved transfer/gait ability.    Baseline 4/11: 15.5 sec    Time 11    Period Weeks     Status New    Target Date 11/16/20             Patient will benefit from skilled therapeutic intervention in order to improve the following deficits and impairments:  Abnormal gait,Decreased activity tolerance,Decreased endurance,Decreased knowledge of use of DME,Decreased range of motion,Decreased strength,Hypomobility,Increased fascial restricitons,Impaired sensation,Impaired UE functional use,Improper body mechanics,Pain,Cardiopulmonary status limiting activity,Decreased balance,Decreased coordination,Decreased mobility,Difficulty walking,Increased muscle spasms,Impaired flexibility,Postural dysfunction,Obesity  Visit Diagnosis: Chronic left-sided low back pain, unspecified whether sciatica present  Chronic right shoulder pain  Chronic left shoulder pain  Muscle weakness (generalized)  Other abnormalities of gait and mobility     Problem List Patient Active Problem List   Diagnosis Date Noted  . Noncompliance with treatment regimen 05/01/2020  . Pharmacologic therapy 11/30/2019  . Bipolar disorder, in full remission, most recent episode mixed (HCC) 11/15/2019  . Bipolar 1 disorder, mixed, moderate (HCC) 06/28/2019  . High risk medication use 06/23/2019  . Bipolar disorder, in full remission, most recent episode depressed (HCC) 06/09/2019  . Total knee replacement status 02/10/2019  . Abnormal ECG 02/09/2019  . Panic attacks 02/03/2019  . GAD (generalized anxiety disorder) 11/25/2018  . Cannabis use disorder, moderate, dependence (HCC) 11/25/2018  . Tobacco use disorder 11/25/2018  . Chronic musculoskeletal pain 07/29/2018  . Hypercholesteremia 08/26/2017  . 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 Chronic sacroiliac joint pain (Primary Source of Pain) (Bilateral) (L>R) 07/09/2016  . Lumbar facet syndrome (Bilateral) (L>R) 07/09/2016  . Neurogenic pain 07/09/2016  . Osteoarthritis 07/09/2016  . Chronic knee pain after total knee replacement  (Right) 07/09/2016  . Marijuana use 06/25/2016  . Bipolar I disorder, most recent episode depressed (HCC) 05/06/2016  . BP (high blood pressure) 05/06/2016  . Irregular cardiac rhythm 05/06/2016  . Extreme obesity 05/06/2016  . Long term current use of anticoagulant therapy (Lovenox) 05/06/2016  . Long term current use of opiate analgesic 05/06/2016  . Long term prescription opiate use 05/06/2016  . Opiate use 05/06/2016  . Opioid dependence, uncomplicated (HCC) 05/06/2016  . Chronic hip pain (Primary Area of Pain) (Bilateral) (L>R) 05/06/2016  . Chronic neck pain (Bilateral) (L>R) 05/06/2016  . Chronic knee pain (Bilateral) (L>R) 05/06/2016  . Radiculopathy of cervical region (B)(R>L)(Thumb + Index fingers) 12/21/2015  . Chronic shoulder pain (Third area of Pain) (Bilateral) (L>R) 12/21/2015  . S/P TKR Arthroplasty (Right) 07/13/2015  . Osteoarthritis of knee (Left) 11/17/2014  . DDD (degenerative disc disease), lumbar 08/30/2014  . Lumbar canal stenosis (L3-4 and L4-5) 05/23/2014  . Anxiety and depression 04/25/2014  . Fibromyalgia 04/25/2014  . Personal history of perinatal problems 04/25/2014  . Chronic low back pain (Secondary area of Pain) (Bilateral) (L>R) 04/25/2014  . Vitamin D deficiency 04/25/2014  . H/O syncope 04/25/2014  . Hyperlipidemia 04/25/2014  . Breathlessness on exertion 04/20/2014  . PVC's (premature ventricular contractions) 04/08/2014  . Asthma 03/26/2014  . Cardiac conduction disorder 03/26/2014  . Hypercholesterolemia without hypertriglyceridemia 03/26/2014  . Chronic pain syndrome 12/30/2013  . Cervical post-laminectomy syndrome (Anterior-posterior approach) (  2014) 12/30/2013  . Major depressive disorder, single episode 05/08/2013  . Depression 05/08/2013  . Thoracic or lumbosacral neuritis or radiculitis 05/07/2012   Temple Pacini PT, DPT 09/11/2020, 5:02 PM  [place] [facility] MAIN [facility] SERVICES [address]  [place], [place], [address] Phone: [phone]   Fax:  [phone]  Name: Shelly Wright MRN: [id number] Date of Birth: 1970-10-05

## 2020-09-13 ENCOUNTER — Ambulatory Visit: Payer: Medicaid Other

## 2020-09-18 ENCOUNTER — Ambulatory Visit: Payer: Medicaid Other | Attending: Family

## 2020-09-18 ENCOUNTER — Other Ambulatory Visit: Payer: Self-pay

## 2020-09-18 DIAGNOSIS — M545 Low back pain, unspecified: Secondary | ICD-10-CM | POA: Insufficient documentation

## 2020-09-18 DIAGNOSIS — R2689 Other abnormalities of gait and mobility: Secondary | ICD-10-CM

## 2020-09-18 DIAGNOSIS — M25512 Pain in left shoulder: Secondary | ICD-10-CM | POA: Insufficient documentation

## 2020-09-18 DIAGNOSIS — M6281 Muscle weakness (generalized): Secondary | ICD-10-CM

## 2020-09-18 DIAGNOSIS — R2681 Unsteadiness on feet: Secondary | ICD-10-CM

## 2020-09-18 DIAGNOSIS — G8929 Other chronic pain: Secondary | ICD-10-CM | POA: Diagnosis present

## 2020-09-18 DIAGNOSIS — M25511 Pain in right shoulder: Secondary | ICD-10-CM | POA: Insufficient documentation

## 2020-09-18 NOTE — Therapy (Signed)
[facility] [address] [place], [place], [address] Phone: [phone]   Fax:  [phone]  Physical Therapy Treatment  Patient Details  Name: Shelly Wright MRN: [id number] Date of Birth: 21-Jul-1970 Referring Provider (PT): Miki KinsShirley, Amanda M, FNP   Encounter Date: 09/18/2020   PT End of Session - 09/18/20 1606    Visit Number 7    Number of Visits 25    Date for PT Re-Evaluation 11/16/20    Authorization Type eval performed 08/24/2020    PT Start Time 1516    PT Stop Time 1600    PT Time Calculation (min) 44 min    Equipment Utilized During Treatment Gait belt    Activity Tolerance Patient tolerated treatment well    Behavior During Therapy [facility] for tasks assessed/performed           Past Medical History:  Diagnosis Date  . Acute right-sided low back pain with right-sided sciatica 08/30/2014  . Anxiety   . Arthritis   . Asthma    in past, no current inhalers  . Bipolar disorder (HCC)   . Cervical spinal cord compression (HCC) 05/07/2012  . COPD (chronic obstructive pulmonary disease) (HCC)   . DDD (degenerative disc disease), lumbar   . Depression   . Dyspnea    with exertion  . Dysrhythmia    irregular-h/o in the past  . Fibromyalgia   . Hypertension   . Neuritis or radiculitis due to rupture of lumbar intervertebral disc 05/23/2014  . Periprosthetic fracture around prosthetic knee 07/31/2015  . Restless leg syndrome   . Soft tissue lesion of shoulder region 01/24/2012  . 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 Syncope and collapse 04/08/2014  . Thoracic and lumbosacral neuritis 05/07/2012  . Thoracic neuritis 05/07/2012    Past Surgical History:  Procedure Laterality Date  . ABDOMINAL HYSTERECTOMY    . Cervical Fusion X 2    . JOINT REPLACEMENT Right 01/2016   Dr Gavin PottersKernodle  . KNEE ARTHROPLASTY Left 02/10/2019   Procedure:  COMPUTER ASSISTED TOTAL KNEE ARTHROPLASTY - RNFA;  Surgeon: Donato HeinzHooten, James P, MD;  Location: [facility] ORS;  Service: Orthopedics;  Laterality: Left;  . KNEE ARTHROSCOPY Bilateral    Right knee scope 1998, Left knee scope  . TONSILLECTOMY    . TOTAL KNEE ARTHROPLASTY Right 02/01/2015   Procedure: TOTAL KNEE ARTHROPLASTY;  Surgeon: Erin SonsHarold Kernodle, MD;  Location: [facility] ORS;  Service: Orthopedics;  Laterality: Right;  . TUBAL LIGATION      There were no vitals filed for this visit.   Subjective Assessment - 09/18/20 1603    Subjective Pt rates pain in shoulder and back 4-5/10. Pt did a lot of lifting and bending yesterday.    Patient is accompained by: Family member   granddaughter (3 y/o)   Pertinent History Pt is a 50 y/o female who presents to PT in transport chair and with SPC. Pt's 3 y/o granddaughter is with her.  The pt reports she has had LBP and B shoulder pain, where L shoulder hurts worse than R, for over ten years. The pt reports she had neck surgery in the 90s, after which her shoulder pain started. She states "it kills me" to raise her shoulders overhead. She reports B shoulder pain is currently 3/10, worst pain reaches 10/10, and best pain reaches 2/10. She says pain is constant. Lifting her arms makes it  worse and is affecting her ability to perform ADLs. The pt says she has not found anything that helps the pain. She is taking gabapentin but reports it has not helped her pain. Pt reports her low back pain travels down L buttock and L LE. Pt's LBP is constant. Pt says sleeping with her "feet up" and rest helps her back pain. She says moving makes it worse, including standing and walking, and leaning backward. Pt sits down to cook and "put the dishes away" due to her back pain. The pt reports she has hx of scoliosis, and disc herniations. She states she has N/T in hands and feet. Denies and B/B changes. The pt has never had PT. The pt has shower chair and SPC, but no other equipment in her home.  The pt reports she lives by herself. 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 Pt reports she is able to sit comfortably for maybe an hour    How long can you stand comfortably? 5-10 minutes    How long can you walk comfortably? 5-10 minutes    Diagnostic tests MRI lumbar spine without contrast 03/01/2020 per chart: "FINDINGS:   Conus medullaris: terminates at approximately L1.   Alignment: Normal.   Marrow Signal: No suspicious lesions.   Sacroiliac Joints: No significant degenerative changes of the visualized SI   joints     Regional Soft Tissues: Unremarkable.     T11-T12: Left sided disc protrusion with moderate bilateral facet   arthropathy and asymmetric right greater than left hypertrophy of the   ligamenta flava resulting in moderate spinal canal stenosis with moderate   spinal canal stenosis and slight deformity of the left ventral aspect of   the spinal cord and right dorsal aspect of the spinal cord. Moderate   bilateral neuroforaminal stenosis.   T12-L1: No significant spinal canal or neuroforaminal stenosis.     L1-L2: Disc bulge with mild bilateral facet arthropathy and hypertrophy of   the ligamenta flava resulting in mild spinal canal stenosis. Mild bilateral   facet arthropathy.   L2-L3: Disc bulge with mild bilateral facet arthropathy and hypertrophy of   the ligamenta flava resulting in moderate spinal canal stenosis. Mild right   neuroforaminal stenosis. Moderate bilateral facet arthropathy.  L3-L4: Disc bulge with loss of disc height and Schmorl's nodes. In   conjunction  with moderate bilateral facet arthropathy and hypertrophy of   the ligamenta flava, this results in severe spinal canal stenosis with   crowding of the cauda equina nerve roots. Severe right neuroforaminal   stenosis. Moderate bilateral facet arthropathy.   L4-L5: Grade 1 anterolisthesis in the setting of severe bilateral facet   arthropathy. In conjunction with a disc bulge, there is severe spinal canal   stenosis with crowding of the cauda equina nerve roots. Severe left and   mild right neuroforaminal stenosis. Severe left and moderate right facet   arthropathy.   L5-S1: No significant spinal canal or neuroforaminal stenosis. Moderate   bilateral facet arthropathy.       IMPRESSION:   Multilevel lumbar spondylosis, as above, resulting in severe spinal canal   stenosis with  crowding of the cauda equina nerve roots at L3-L4 and L4-L5. ";  XRAY hip B 3 or 4 veiws with or without pelvis 02/28/2020 per chart: "Radiographs taken today, 4 views of bilateral hips, demonstrate   well-preserved joint spaces of both the left and right hip. No other bony   or soft tissue abnormality is noted.  "    Patient Stated Goals Pt states "I want to do what I want to be able to do." Pt would like to be able to stand and walk comfortably.    Currently in Pain? Yes    Pain Score 5     Pain Location Back    Pain Orientation Lower    Pain Onset More than a month ago    Pain Location Shoulder    Pain Orientation Right;Left    Pain Onset More than a month ago           TREATMENT  THEREX:  Scapular squeezes 1x15, 1x20; VC/TC for technique RTB rows 2x15; VC/TC for technique. Pt rates "medium" RTB diagonals 3x10; Demo/VC for technique. Pt rates "medium" RTB pull-aparts/ER; pt rates "medium" RUE AAROM ER - 10x Chin tucks with manual resistance 2x10 with 3 sec hold; Further demo for improved technique along with VC. Serratus punch 3x10; Demo/VC/TC for exercise technique. Pt rates difficult on L shoulder  L shoulder  pendulums - 10x CW/CC. VC/TC/Demo for technique  NEURO Re-Ed: at support bar, close CGA-min assist provided   SLB B LES - 30 sec each LE Standing on airex pad NBOS - 4x30 sec; intermittent UE support on support bar. Pt rates moderate difficulty. Standing tandem on even surface - 4x30 sec B LES; intermittent UE support Stepping over hurdle forward/backward/side-to-side approx 15x for each. Intermittent UE support. Demo/VC for technique.  Ambulation in agility ladder with SPC, forward 6x, close CGA, no decrease in postural stability  Ambulation in agility ladder with SPC, backward -2x close CGA-min assist. Pt performs with crouched posture, heavy SUE support on SPC, step-to pattern, with multiple LOB requiring min a from PT to regain balance.    Education provided via VC/TC/demonstration to facilitate improved muscle contraction and movement at target joints with all exercise. Pt exhibits good carryover with technique within session.     PT Education - 09/18/20 1604    Education Details exercise technique, body mechanics with RTB diagonals    Person(s) Educated Patient    Methods Explanation;Tactile cues;Demonstration;Verbal cues    Comprehension Verbalized understanding;Returned demonstration            PT Short Term Goals - 08/24/20 1633      PT SHORT TERM GOAL #1   Title Patient will be independent in home exercise program to improve strength/mobility for better functional independence with ADLs.    Baseline 4/7: to be initiated next appointment    Time 6    Period Weeks    Status New    Target Date 10/05/20      PT SHORT TERM GOAL #2   Title Pt will demonstrate improvement in B shoulder abduction by at least 20 degrees in order to improve ease with pt washing her hair.    Baseline 4/7: L shoulder abduction 120 deg., R shoulder abduction 100 deg.    Time 6    Period Weeks    Status New    Target Date 10/05/20             PT Long Term Goals - 08/28/20 1717  PT  LONG TERM GOAL #1   Title Patient will increase FOTO score to equal to or greater than 60 to demonstrate statistically significant improvement in mobility and quality of life.    Baseline 4/7: 51    Time 12    Period Weeks    Status New    Target Date 11/16/20      PT LONG TERM GOAL #2   Title Patient (< 62 years old) will complete five times sit to stand test in < 10 seconds indicating an increased LE strength and improved balance.    Baseline 4/7: 20.8 sec    Time 12    Period Weeks    Status New    Target Date 11/16/20      PT LONG TERM GOAL #3   Title Patient will report a worst L shoulder pain of 5/10 on VAS in last seven days to improve tolerance with ADLs and reduced symptoms with activities.    Baseline 4/7: worst pain 10/10    Time 12    Period Weeks    Status New    Target Date 11/16/20      PT LONG TERM GOAL #4   Title Patient will reduce modified Oswestry score to <20 as to demonstrate minimal disability with ADLs including improved sleeping tolerance, walking/sitting tolerance etc for better mobility with ADLs.    Baseline 4/7: 32%    Time 12    Period Weeks    Status New    Target Date 11/16/20      PT LONG TERM GOAL #5   Title Patient will decrease Quick DASH score by > 8 points demonstrating reduced self-reported upper extremity disability.    Baseline 4/7: 52%    Time 12    Period Weeks    Status New    Target Date 11/16/20      Additional Long Term Goals   Additional Long Term Goals Yes      PT LONG TERM GOAL #6   Title Pt will demonstrate an increase in B hip flexor, B hip abductor, and B knee flexor strength by at least 1/2 point on the MMT to indicate improved strength to increase ease with all functional mobility.    Baseline 4/7: B hip flexion 4/5, B hip abductoin 4/5, B knee flexion 4/5    Time 12    Period Weeks    Status New    Target Date 11/16/20      PT LONG TERM GOAL #7   Title Patient will reduce timed up and go to <11 seconds to  reduce fall risk and demonstrate improved transfer/gait ability.    Baseline 4/11: 15.5 sec    Time 11    Period Weeks    Status New    Target Date 11/16/20                 Plan - 09/18/20 1607    Clinical Impression Statement Pt highly motivated to participate in session. Pt was able to perform all shoulder exercises without increase in pain from baseline. She also exhibits improved strength and ROM of RUE with RTB ER pull-aparts as evidenced by improved technique. The pt required close CGA-min assist with ambulating backward in agility ladder due to LOB in order to regain balance. The pt will benefit from further skilled therapy to improve BLE and BUE strength and balance to improve functional mobility and decrease fall risk.    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, polyneuropathy, smokes, R TKR (02/01/2015?), L TKA (02/10/2019)    Examination-Activity Limitations Bathing;Bed Mobility;Bend;Caring for Others;Carry;Dressing;Hygiene/Grooming;Lift;Locomotion Level;Reach Overhead;Self Feeding;Sit;Sleep;Squat;Stairs;Stand;Toileting;Transfers    Examination-Participation Restrictions Cleaning;Laundry;Yard Work;Medication Management;Community Activity;Driving;Meal Prep;Shop;Volunteer    Stability/Clinical Decision Making Evolving/Moderate complexity    Rehab Potential Fair    PT Frequency 2x / week    PT Duration 12 weeks    PT Treatment/Interventions ADLs/Self Care Home Management;Aquatic Therapy;Biofeedback;Cryotherapy;Electrical Stimulation;Moist  Heat;Traction;Ultrasound;Parrafin;DME Instruction;Gait training;Stair training;Functional mobility training;Therapeutic activities;Therapeutic exercise;Balance training;Neuromuscular re-education;Patient/family education;Orthotic Fit/Training;Manual techniques;Wheelchair mobility training;Passive range of motion;Dry needling;Energy conservation;Taping;Splinting;Joint Manipulations    PT Next Visit Plan Further balance assessment, initiate HEP and strengthening and mobility exercises; nerve flossing LLE, practice meal-prep in kitchen/body mechanics, continue plan as previously indicated    PT Home Exercise Plan access code: [email or web address]    Consulted and Agree with Plan of Care Patient           Patient will benefit from skilled therapeutic intervention in order to improve the following deficits and impairments:  Abnormal gait,Decreased activity tolerance,Decreased endurance,Decreased knowledge of use of DME,Decreased range of motion,Decreased strength,Hypomobility,Increased fascial restricitons,Impaired sensation,Impaired UE functional use,Improper body mechanics,Pain,Cardiopulmonary status limiting activity,Decreased balance,Decreased coordination,Decreased mobility,Difficulty walking,Increased muscle spasms,Impaired flexibility,Postural dysfunction,Obesity  Visit Diagnosis: Muscle weakness (generalized)  Other abnormalities of gait and mobility  Unsteadiness on feet  Chronic right shoulder pain  Chronic left shoulder pain     Problem List Patient Active Problem List   Diagnosis Date Noted  . Noncompliance with treatment regimen 05/01/2020  . Pharmacologic therapy 11/30/2019  . Bipolar disorder, in full remission, most recent episode mixed (HCC) 11/15/2019  . Bipolar 1 disorder, mixed, moderate (HCC) 06/28/2019  . High risk medication use 06/23/2019  . Bipolar disorder, in full remission, most recent episode depressed (HCC) 06/09/2019  . Total knee replacement status 02/10/2019  .  Abnormal ECG 02/09/2019  . Panic attacks 02/03/2019  . GAD (generalized anxiety disorder) 11/25/2018  . Cannabis use disorder, moderate, dependence (HCC) 11/25/2018  . Tobacco use disorder 11/25/2018  . Chronic musculoskeletal pain 07/29/2018  . Hypercholesteremia 08/26/2017  . 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 Chronic sacroiliac joint pain (Primary Source of Pain) (Bilateral) (L>R) 07/09/2016  . Lumbar facet syndrome (Bilateral) (L>R) 07/09/2016  . Neurogenic pain 07/09/2016  . Osteoarthritis 07/09/2016  . Chronic knee pain after total knee replacement (Right) 07/09/2016  . Marijuana use 06/25/2016  . Bipolar I disorder, most recent episode depressed (HCC) 05/06/2016  . BP (high blood pressure) 05/06/2016  . Irregular cardiac rhythm 05/06/2016  . Extreme obesity 05/06/2016  . Long term current use of anticoagulant therapy (Lovenox) 05/06/2016  . Long term current use of opiate analgesic 05/06/2016  . Long term prescription opiate use 05/06/2016  . Opiate use 05/06/2016  . Opioid dependence, uncomplicated (HCC) 05/06/2016  . Chronic hip pain (Primary Area of Pain) (Bilateral) (L>R) 05/06/2016  . Chronic neck pain (Bilateral) (L>R) 05/06/2016  . Chronic knee pain (Bilateral) (L>R) 05/06/2016  . Radiculopathy of cervical region (B)(R>L)(Thumb + Index fingers) 12/21/2015  . Chronic shoulder pain (Third area of Pain) (Bilateral) (L>R) 12/21/2015  . S/P TKR Arthroplasty (Right) 07/13/2015  . Osteoarthritis of knee (Left) 11/17/2014  . DDD (degenerative disc disease), lumbar 08/30/2014  . Lumbar canal stenosis (L3-4 and L4-5) 05/23/2014  . Anxiety  and depression 04/25/2014  . Fibromyalgia 04/25/2014  . Personal history of perinatal problems 04/25/2014  . Chronic low back pain (Secondary area of Pain) (Bilateral) (L>R) 04/25/2014  . Vitamin D deficiency 04/25/2014  . H/O syncope 04/25/2014  . Hyperlipidemia 04/25/2014  . Breathlessness on exertion 04/20/2014   . PVC's (premature ventricular contractions) 04/08/2014  . Asthma 03/26/2014  . Cardiac conduction disorder 03/26/2014  . Hypercholesterolemia without hypertriglyceridemia 03/26/2014  . Chronic pain syndrome 12/30/2013  . Cervical post-laminectomy syndrome (Anterior-posterior approach) (2014) 12/30/2013  . Major depressive disorder, single episode 05/08/2013  . Depression 05/08/2013  . Thoracic or lumbosacral neuritis or radiculitis 05/07/2012   Temple Pacini PT, DPT 09/18/2020, 4:17 PM  [place] [facility] MAIN [facility] SERVICES [address] [place], [place], [address] Phone: [phone]   Fax:  [phone]  Name: Shelly Wright MRN: [id number] Date of Birth: 04/25/1971

## 2020-09-20 ENCOUNTER — Ambulatory Visit: Payer: Medicaid Other

## 2020-09-20 ENCOUNTER — Other Ambulatory Visit: Payer: Self-pay

## 2020-09-20 DIAGNOSIS — M6281 Muscle weakness (generalized): Secondary | ICD-10-CM

## 2020-09-20 DIAGNOSIS — R2689 Other abnormalities of gait and mobility: Secondary | ICD-10-CM

## 2020-09-20 DIAGNOSIS — R2681 Unsteadiness on feet: Secondary | ICD-10-CM

## 2020-09-20 NOTE — Therapy (Signed)
[facility] MAIN [facility] SERVICES [address] [place], [place], [address] Phone: [phone]   Fax:  [phone]  Physical Therapy Treatment  Patient Details  Name: Genisis Sonnier Watchman MRN: [id number] Date of Birth: 09/23/1970 Referring Provider (PT): Miki Kins, FNP   Encounter Date: 09/20/2020   PT End of Session - 09/20/20 1616    Visit Number 8    Number of Visits 25    Date for PT Re-Evaluation 11/16/20    Authorization Type eval performed 08/24/2020    PT Start Time 1519    PT Stop Time 1601    PT Time Calculation (min) 42 min    Equipment Utilized During Treatment Gait belt    Activity Tolerance Patient tolerated treatment well    Behavior During Therapy [facility] for tasks assessed/performed           Past Medical History:  Diagnosis Date  . Acute right-sided low back pain with right-sided sciatica 08/30/2014  . Anxiety   . Arthritis   . Asthma    in past, no current inhalers  . Bipolar disorder (HCC)   . Cervical spinal cord compression (HCC) 05/07/2012  . COPD (chronic obstructive pulmonary disease) (HCC)   . DDD (degenerative disc disease), lumbar   . Depression   . Dyspnea    with exertion  . Dysrhythmia    irregular-h/o in the past  . Fibromyalgia   . Hypertension   . Neuritis or radiculitis due to rupture of lumbar intervertebral disc 05/23/2014  . Periprosthetic fracture around prosthetic knee 07/31/2015  . Restless leg syndrome   . Soft tissue lesion of shoulder region 01/24/2012  . 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 Syncope and collapse 04/08/2014  . Thoracic and lumbosacral neuritis 05/07/2012  . Thoracic neuritis 05/07/2012    Past Surgical History:  Procedure Laterality Date  . ABDOMINAL HYSTERECTOMY    . Cervical Fusion X 2    . JOINT REPLACEMENT Right 01/2016   Dr Gavin Potters  . KNEE ARTHROPLASTY Left 02/10/2019   Procedure:  COMPUTER ASSISTED TOTAL KNEE ARTHROPLASTY - RNFA;  Surgeon: Donato Heinz, MD;  Location: [facility] ORS;  Service: Orthopedics;  Laterality: Left;  . KNEE ARTHROSCOPY Bilateral    Right knee scope 1998, Left knee scope  . TONSILLECTOMY    . TOTAL KNEE ARTHROPLASTY Right 02/01/2015   Procedure: TOTAL KNEE ARTHROPLASTY;  Surgeon: Erin Sons, MD;  Location: [facility] ORS;  Service: Orthopedics;  Laterality: Right;  . TUBAL LIGATION      There were no vitals filed for this visit.   Subjective Assessment - 09/20/20 1615    Subjective Pt reports usual pain (at baseline). She has not had to lift or bend since previous session. Denies falls or near-falls.    Patient is accompained by: Family member   granddaughter (3 y/o)   Pertinent History Pt is a 50 y/o female who presents to PT in transport chair and with SPC. Pt's 3 y/o granddaughter is with her.  The pt reports she has had LBP and B shoulder pain, where L shoulder hurts worse than R, for over ten years. The pt reports she had neck surgery in the 90s, after which her shoulder pain started. She states "it kills me" to raise her shoulders overhead. She reports B shoulder pain is currently 3/10, worst pain reaches 10/10, and best pain reaches 2/10. She says pain is constant. Lifting  her arms makes it worse and is affecting her ability to perform ADLs. The pt says she has not found anything that helps the pain. She is taking gabapentin but reports it has not helped her pain. Pt reports her low back pain travels down L buttock and L LE. Pt's LBP is constant. Pt says sleeping with her "feet up" and rest helps her back pain. She says moving makes it worse, including standing and walking, and leaning backward. Pt sits down to cook and "put the dishes away" due to her back pain. The pt reports she has hx of scoliosis, and disc herniations. She states she has N/T in hands and feet. Denies and B/B changes. The pt has never had PT. The pt has shower chair and SPC, but no  other equipment in her home. The pt reports she lives by herself. 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 Pt reports she is able to sit comfortably for maybe an hour    How long can you stand comfortably? 5-10 minutes    How long can you walk comfortably? 5-10 minutes    Diagnostic tests MRI lumbar spine without contrast 03/01/2020 per chart: "FINDINGS:   Conus medullaris: terminates at approximately L1.   Alignment: Normal.   Marrow Signal: No suspicious lesions.   Sacroiliac Joints: No significant degenerative changes of the visualized SI   joints     Regional Soft Tissues: Unremarkable.     T11-T12: Left sided disc protrusion with moderate bilateral facet   arthropathy and asymmetric right greater than left hypertrophy of the   ligamenta flava resulting in moderate spinal canal stenosis with moderate   spinal canal stenosis and slight deformity of the left ventral aspect of   the spinal cord and right dorsal aspect of the spinal cord. Moderate   bilateral neuroforaminal stenosis.   T12-L1: No significant spinal canal or neuroforaminal stenosis.     L1-L2: Disc bulge with mild bilateral facet arthropathy and hypertrophy of   the ligamenta flava resulting in mild spinal canal stenosis. Mild bilateral   facet arthropathy.   L2-L3: Disc bulge with mild bilateral facet arthropathy and hypertrophy of   the ligamenta flava resulting in moderate spinal canal stenosis. Mild right   neuroforaminal stenosis. Moderate bilateral facet arthropathy.  L3-L4: Disc bulge with loss of disc height and  Schmorl's nodes. In   conjunction with moderate bilateral facet arthropathy and hypertrophy of   the ligamenta flava, this results in severe spinal canal stenosis with   crowding of the cauda equina nerve roots. Severe right neuroforaminal   stenosis. Moderate bilateral facet arthropathy.   L4-L5: Grade 1 anterolisthesis in the setting of severe bilateral facet   arthropathy. In conjunction with a disc bulge, there is severe spinal canal   stenosis with crowding of the cauda equina nerve roots. Severe left and   mild right neuroforaminal stenosis. Severe left and moderate right facet   arthropathy.   L5-S1: No significant spinal canal or neuroforaminal stenosis. Moderate   bilateral facet arthropathy.       IMPRESSION:   Multilevel lumbar spondylosis, as above, resulting in severe spinal canal  stenosis with crowding of the cauda equina nerve roots at L3-L4 and L4-L5. ";  XRAY hip B 3 or 4 veiws with or without pelvis 02/28/2020 per chart: "Radiographs taken today, 4 views of bilateral hips, demonstrate   well-preserved joint spaces of both the left and right hip. No other bony   or soft tissue abnormality is noted.  "    Patient Stated Goals Pt states "I want to do what I want to be able to do." Pt would like to be able to stand and walk comfortably.    Currently in Pain? Yes    Pain Location Back    Pain Orientation Lower    Pain Onset More than a month ago    Pain Location Hip    Pain Onset More than a month ago           TREATMENT  THEREX:  Ice pack applied to low back for approx 12 min as pt performed some of the following exercises (below). Once ice pack removed pt reports felt "good" and no adverse reaction to treatment observed or reported.   Seated adductor squeezes - 2x20 Scapular squeezes - 1x15, 1x20; VC/TC for technique RTB rows - 2x20; VC for technique. Pt rates "medium" RTB diagonals - 1x15, 1x10; Demo/VC for technique. Pt rates "medium" RTB pull-aparts/shoulder ER - 2x12; pt  rates medium. Pt continues to have difficulty with technique on R RUE AROM ER - 10x. Pt able to demo correct technique performing without resistance YTB pull-aparts/shoulder ER on R UE - 10x; pt continues to exhibit difficulty with technique Serratus punch 2x12; Demo/VC for exercise technique.  Seated sciatic nerve floss - 2x15 each LE   NEURO Re-Education: at support bar, close CGA provided for all of the following  SLB B LES - 2x30; Demo/VC for technique. Pt requires intermittent UE support/toe-touch. Stepping over hurdle forward/backward/side-to-side - 2x20 for each, alternating LEs as leading leg. Pt greatest difficulty stepping backward. Stepping onto and off airex pad forward and backward - 2x10 each way. Difficulty stepping backward/exhibits toe catch.  Standing tandem on even surface - 2x30 sec B LES; Decreased need for UE support this session. Ambulation in agility ladder with SPC, forward 10x, close CGA, no decrease in postural stability  Ambulation in agility ladder with SPC, backward -10x close CGA. Pt performs with crouched posture, continued increased SUE support on SPC, step-to pattern, with a couple instances of LOB where pt was able to regain balance with UE support on cane and step. Ambulation in agility ladder with SPC, side stepping 4x, close CGA, no decrease in postural stability   Education provided via VC/TC/demonstration to facilitate improved muscle contraction and movement at target joints with all exercise. Pt exhibits good carryover with technique within session.    PT Education - 09/20/20 1616    Education Details exercise technique, body mechanics    Person(s) Educated Patient    Methods Explanation;Demonstration;Verbal cues    Comprehension Verbalized understanding;Returned demonstration            PT Short Term Goals - 08/24/20 1633      PT SHORT TERM GOAL #1   Title Patient will be independent in home exercise program to improve strength/mobility for  better functional independence with ADLs.    Baseline 4/7: to be initiated next appointment    Time 6    Period Weeks    Status New    Target Date 10/05/20      PT SHORT TERM GOAL #2  Title Pt will demonstrate improvement in B shoulder abduction by at least 20 degrees in order to improve ease with pt washing her hair.    Baseline 4/7: L shoulder abduction 120 deg., R shoulder abduction 100 deg.    Time 6    Period Weeks    Status New    Target Date 10/05/20             PT Long Term Goals - 08/28/20 1717      PT LONG TERM GOAL #1   Title Patient will increase FOTO score to equal to or greater than 60 to demonstrate statistically significant improvement in mobility and quality of life.    Baseline 4/7: 51    Time 12    Period Weeks    Status New    Target Date 11/16/20      PT LONG TERM GOAL #2   Title Patient (< 52 years old) will complete five times sit to stand test in < 10 seconds indicating an increased LE strength and improved balance.    Baseline 4/7: 20.8 sec    Time 12    Period Weeks    Status New    Target Date 11/16/20      PT LONG TERM GOAL #3   Title Patient will report a worst L shoulder pain of 5/10 on VAS in last seven days to improve tolerance with ADLs and reduced symptoms with activities.    Baseline 4/7: worst pain 10/10    Time 12    Period Weeks    Status New    Target Date 11/16/20      PT LONG TERM GOAL #4   Title Patient will reduce modified Oswestry score to <20 as to demonstrate minimal disability with ADLs including improved sleeping tolerance, walking/sitting tolerance etc for better mobility with ADLs.    Baseline 4/7: 32%    Time 12    Period Weeks    Status New    Target Date 11/16/20      PT LONG TERM GOAL #5   Title Patient will decrease Quick DASH score by > 8 points demonstrating reduced self-reported upper extremity disability.    Baseline 4/7: 52%    Time 12    Period Weeks    Status New    Target Date 11/16/20       Additional Long Term Goals   Additional Long Term Goals Yes      PT LONG TERM GOAL #6   Title Pt will demonstrate an increase in B hip flexor, B hip abductor, and B knee flexor strength by at least 1/2 point on the MMT to indicate improved strength to increase ease with all functional mobility.    Baseline 4/7: B hip flexion 4/5, B hip abductoin 4/5, B knee flexion 4/5    Time 12    Period Weeks    Status New    Target Date 11/16/20      PT LONG TERM GOAL #7   Title Patient will reduce timed up and go to <11 seconds to reduce fall risk and demonstrate improved transfer/gait ability.    Baseline 4/11: 15.5 sec    Time 11    Period Weeks    Status New    Target Date 11/16/20                 Plan - 09/20/20 1617    Clinical Impression Statement Pt continues to be motivated in therapy. She did experience brief  increases in LBP that quickly resovled to her baseline with rest breaks, and she was able to perform multiple standing exercises. She continues to be challenged with backward stepping, but showed improved postural stability with agility ladder balance tasks today. The pt will benefit from further skilled therapy to improve strength and balance to increase ease with ADLs and QOL.    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, polyneuropathy, smokes, R TKR (02/01/2015?), L TKA (02/10/2019)    Examination-Activity Limitations Bathing;Bed Mobility;Bend;Caring for Others;Carry;Dressing;Hygiene/Grooming;Lift;Locomotion Level;Reach Overhead;Self  Feeding;Sit;Sleep;Squat;Stairs;Stand;Toileting;Transfers    Examination-Participation Restrictions Cleaning;Laundry;Yard Work;Medication Management;Community Activity;Driving;Meal Prep;Shop;Volunteer    Stability/Clinical Decision Making Evolving/Moderate complexity    Rehab Potential Fair    PT Frequency 2x / week    PT Duration 12 weeks    PT Treatment/Interventions ADLs/Self Care Home Management;Aquatic Therapy;Biofeedback;Cryotherapy;Electrical Stimulation;Moist Heat;Traction;Ultrasound;Parrafin;DME Instruction;Gait training;Stair training;Functional mobility training;Therapeutic activities;Therapeutic exercise;Balance training;Neuromuscular re-education;Patient/family education;Orthotic Fit/Training;Manual techniques;Wheelchair mobility training;Passive range of motion;Dry needling;Energy conservation;Taping;Splinting;Joint Manipulations    PT Next Visit Plan Further balance assessment, initiate HEP and strengthening and mobility exercises; nerve flossing LLE, practice meal-prep in kitchen/body mechanics, continue plan as previously indicated    PT Home Exercise Plan access code: [email or web address]    Consulted and Agree with Plan of Care Patient           Patient will benefit from skilled therapeutic intervention in order to improve the following deficits and impairments:  Abnormal gait,Decreased activity tolerance,Decreased endurance,Decreased knowledge of use of DME,Decreased range of motion,Decreased strength,Hypomobility,Increased fascial restricitons,Impaired sensation,Impaired UE functional use,Improper body mechanics,Pain,Cardiopulmonary status limiting activity,Decreased balance,Decreased coordination,Decreased mobility,Difficulty walking,Increased muscle spasms,Impaired flexibility,Postural dysfunction,Obesity  Visit Diagnosis: Unsteadiness on feet  Muscle weakness (generalized)  Other abnormalities of gait and mobility     Problem List Patient Active Problem List   Diagnosis Date  Noted  . Noncompliance with treatment regimen 05/01/2020  . Pharmacologic therapy 11/30/2019  . Bipolar disorder, in full remission, most recent episode mixed (HCC) 11/15/2019  . Bipolar 1 disorder, mixed, moderate (HCC) 06/28/2019  . High risk medication use 06/23/2019  . Bipolar disorder, in full remission, most recent episode depressed (HCC) 06/09/2019  . Total knee replacement status 02/10/2019  . Abnormal ECG 02/09/2019  . Panic attacks 02/03/2019  . GAD (generalized anxiety disorder) 11/25/2018  . Cannabis use disorder, moderate, dependence (HCC) 11/25/2018  . Tobacco use disorder 11/25/2018  . Chronic musculoskeletal pain 07/29/2018  . Hypercholesteremia 08/26/2017  . 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 Chronic sacroiliac joint pain (Primary Source of Pain) (Bilateral) (L>R) 07/09/2016  . Lumbar facet syndrome (Bilateral) (L>R) 07/09/2016  . Neurogenic pain 07/09/2016  . Osteoarthritis 07/09/2016  . Chronic knee pain after total knee replacement (Right) 07/09/2016  . Marijuana use 06/25/2016  . Bipolar I disorder, most recent episode depressed (HCC) 05/06/2016  . BP (high blood pressure) 05/06/2016  . Irregular cardiac rhythm 05/06/2016  . Extreme obesity 05/06/2016  . Long term current use of anticoagulant therapy (Lovenox) 05/06/2016  . Long term current use of opiate analgesic 05/06/2016  . Long term prescription opiate use 05/06/2016  . Opiate use 05/06/2016  . Opioid dependence, uncomplicated (HCC) 05/06/2016  . Chronic hip pain (Primary Area of Pain) (Bilateral) (L>R) 05/06/2016  . Chronic neck pain (Bilateral) (L>R) 05/06/2016  . Chronic knee  pain (Bilateral) (L>R) 05/06/2016  . Radiculopathy of cervical region (B)(R>L)(Thumb + Index fingers) 12/21/2015  . Chronic shoulder pain (Third area of Pain) (Bilateral) (L>R) 12/21/2015  . S/P TKR Arthroplasty (Right) 07/13/2015  . Osteoarthritis of knee (Left) 11/17/2014  . DDD (degenerative disc  disease), lumbar 08/30/2014  . Lumbar canal stenosis (L3-4 and L4-5) 05/23/2014  . Anxiety and depression 04/25/2014  . Fibromyalgia 04/25/2014  . Personal history of perinatal problems 04/25/2014  . Chronic low back pain (Secondary area of Pain) (Bilateral) (L>R) 04/25/2014  . Vitamin D deficiency 04/25/2014  . H/O syncope 04/25/2014  . Hyperlipidemia 04/25/2014  . Breathlessness on exertion 04/20/2014  . PVC's (premature ventricular contractions) 04/08/2014  . Asthma 03/26/2014  . Cardiac conduction disorder 03/26/2014  . Hypercholesterolemia without hypertriglyceridemia 03/26/2014  . Chronic pain syndrome 12/30/2013  . Cervical post-laminectomy syndrome (Anterior-posterior approach) (2014) 12/30/2013  . Major depressive disorder, single episode 05/08/2013  . Depression 05/08/2013  . Thoracic or lumbosacral neuritis or radiculitis 05/07/2012   Temple Pacini PT, DPT 09/20/2020, 4:21 PM  [facility] MAIN [facility] SERVICES [address] [place], [place], [address] Phone: [phone]   Fax:  [phone]  Name: Aleeya Veitch Jolliffe MRN: [id number] Date of Birth: 03/26/1971

## 2020-09-25 ENCOUNTER — Ambulatory Visit: Payer: Medicaid Other

## 2020-09-25 ENCOUNTER — Other Ambulatory Visit: Payer: Self-pay

## 2020-09-25 DIAGNOSIS — M6281 Muscle weakness (generalized): Secondary | ICD-10-CM | POA: Diagnosis not present

## 2020-09-25 DIAGNOSIS — R2689 Other abnormalities of gait and mobility: Secondary | ICD-10-CM

## 2020-09-25 DIAGNOSIS — M545 Low back pain, unspecified: Secondary | ICD-10-CM

## 2020-09-25 NOTE — Therapy (Signed)
[facility] MAIN [facility] SERVICES [address] [place], [place], [address] Phone: [phone]   Fax:  [phone]  Physical Therapy Treatment  Patient Details  Name: Shelly Wright MRN: [id number] Date of Birth: February 28, 1971 Referring Provider (PT): Miki Kins, FNP   Encounter Date: 09/25/2020   PT End of Session - 09/25/20 1652    Visit Number 9    Number of Visits 25    Date for PT Re-Evaluation 11/16/20    Authorization Type eval performed 08/24/2020    PT Start Time 1517    PT Stop Time 1600    PT Time Calculation (min) 43 min    Equipment Utilized During Treatment Gait belt    Activity Tolerance Patient tolerated treatment well    Behavior During Therapy [facility] for tasks assessed/performed           Past Medical History:  Diagnosis Date  . Acute right-sided low back pain with right-sided sciatica 08/30/2014  . Anxiety   . Arthritis   . Asthma    in past, no current inhalers  . Bipolar disorder (HCC)   . Cervical spinal cord compression (HCC) 05/07/2012  . COPD (chronic obstructive pulmonary disease) (HCC)   . DDD (degenerative disc disease), lumbar   . Depression   . Dyspnea    with exertion  . Dysrhythmia    irregular-h/o in the past  . Fibromyalgia   . Hypertension   . Neuritis or radiculitis due to rupture of lumbar intervertebral disc 05/23/2014  . Periprosthetic fracture around prosthetic knee 07/31/2015  . Restless leg syndrome   . Soft tissue lesion of shoulder region 01/24/2012  . 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 Syncope and collapse 04/08/2014  . Thoracic and lumbosacral neuritis 05/07/2012  . Thoracic neuritis 05/07/2012    Past Surgical History:  Procedure Laterality Date  . ABDOMINAL HYSTERECTOMY    . Cervical Fusion X 2    . JOINT REPLACEMENT Right 01/2016   Dr Gavin Potters  . KNEE ARTHROPLASTY Left 02/10/2019   Procedure:  COMPUTER ASSISTED TOTAL KNEE ARTHROPLASTY - RNFA;  Surgeon: Donato Heinz, MD;  Location: [facility] ORS;  Service: Orthopedics;  Laterality: Left;  . KNEE ARTHROSCOPY Bilateral    Right knee scope 1998, Left knee scope  . TONSILLECTOMY    . TOTAL KNEE ARTHROPLASTY Right 02/01/2015   Procedure: TOTAL KNEE ARTHROPLASTY;  Surgeon: Erin Sons, MD;  Location: [facility] ORS;  Service: Orthopedics;  Laterality: Right;  . TUBAL LIGATION      There were no vitals filed for this visit.   Subjective Assessment - 09/25/20 1651    Subjective Pt reports she is moving to [place] after the 18th.   Says back pain is 6/10 currently. Pt says back pain got up to 10/10 this weekend.  Pt says pain right next to spine on R and "shoots up and down" low back.    Patient is accompained by: Family member   granddaughter (3 y/o)   Pertinent History Pt is a 50 y/o female who presents to PT in transport chair and with SPC. Pt's 3 y/o granddaughter is with her.  The pt reports she has had LBP and B shoulder pain, where L shoulder hurts worse than R, for over ten years. The pt reports she had neck surgery in the 90s, after which her shoulder pain started. She states "it kills me" to raise her shoulders  overhead. She reports B shoulder pain is currently 3/10, worst pain reaches 10/10, and best pain reaches 2/10. She says pain is constant. Lifting her arms makes it worse and is affecting her ability to perform ADLs. The pt says she has not found anything that helps the pain. She is taking gabapentin but reports it has not helped her pain. Pt reports her low back pain travels down L buttock and L LE. Pt's LBP is constant. Pt says sleeping with her "feet up" and rest helps her back pain. She says moving makes it worse, including standing and walking, and leaning backward. Pt sits down to cook and "put the dishes away" due to her back pain. The pt reports she has hx of scoliosis, and disc herniations. She states she has N/T in hands and feet.  Denies and B/B changes. The pt has never had PT. The pt has shower chair and SPC, but no other equipment in her home. The pt reports she lives by herself. 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 Pt reports she is able to sit comfortably for maybe an hour    How long can you stand comfortably? 5-10 minutes    How long can you walk comfortably? 5-10 minutes    Diagnostic tests MRI lumbar spine without contrast 03/01/2020 per chart: "FINDINGS:   Conus medullaris: terminates at approximately L1.   Alignment: Normal.   Marrow Signal: No suspicious lesions.   Sacroiliac Joints: No significant degenerative changes of the visualized SI   joints     Regional Soft Tissues: Unremarkable.     T11-T12: Left sided disc protrusion with moderate bilateral facet   arthropathy and asymmetric right greater than left hypertrophy of the   ligamenta flava resulting in moderate spinal canal stenosis with moderate   spinal canal stenosis and slight deformity of the left ventral aspect of   the spinal cord and right dorsal aspect of the spinal cord. Moderate   bilateral neuroforaminal stenosis.   T12-L1: No significant spinal canal or neuroforaminal stenosis.     L1-L2: Disc bulge with mild bilateral facet arthropathy and hypertrophy of   the ligamenta flava resulting in mild spinal canal stenosis. Mild bilateral   facet arthropathy.   L2-L3: Disc bulge with mild bilateral facet arthropathy and hypertrophy of   the ligamenta flava resulting in moderate spinal canal stenosis. Mild right   neuroforaminal stenosis.  Moderate bilateral facet arthropathy.  L3-L4: Disc bulge with loss of disc height and Schmorl's nodes. In   conjunction with moderate bilateral facet arthropathy and hypertrophy of   the ligamenta flava, this results in severe spinal canal stenosis with   crowding of the cauda equina nerve roots. Severe right neuroforaminal   stenosis. Moderate bilateral facet arthropathy.   L4-L5: Grade 1 anterolisthesis in the setting of severe bilateral facet   arthropathy. In conjunction with a disc bulge, there is severe spinal canal   stenosis with crowding of the cauda equina nerve roots. Severe left and   mild right neuroforaminal stenosis. Severe left and moderate right facet   arthropathy.   L5-S1: No significant spinal canal or neuroforaminal stenosis. Moderate  bilateral facet arthropathy.       IMPRESSION:   Multilevel lumbar spondylosis, as above, resulting in severe spinal canal   stenosis with crowding of the cauda equina nerve roots at L3-L4 and L4-L5. ";  XRAY hip B 3 or 4 veiws with or without pelvis 02/28/2020 per chart: "Radiographs taken today, 4 views of bilateral hips, demonstrate   well-preserved joint spaces of both the left and right hip. No other bony   or soft tissue abnormality is noted.  "    Patient Stated Goals Pt states "I want to do what I want to be able to do." Pt would like to be able to stand and walk comfortably.    Currently in Pain? Yes    Pain Score 6     Pain Location Back    Pain Orientation Lower;Right    Pain Onset More than a month ago    Pain Onset More than a month ago           TREATMENT  THEREX:  Ice applied to pt low back while pt performs the following in supine (for approx 20 min):  Supine: Lower trunk rotations - 1x20 Supine Marches - 3x15 Supine blue physioball hamstring curls - 3x15 SLR - 3x10 within pain tolerance.  Supine adductor squeezes - 2x20 Supine BTB hip abduction - 3x15; pt rates exercies between easy-medium Glute bridges - attempted,  however painful upon initiation of exercise so discontinued.   Cat-camel - 2x15 within pain-tolerance; pt reports no pain Physioball roll-outs forward/backward - 2x10 Physioball roll-outs side-to-side - 16x  Seated glute squeezes - 2x10 with 3 sec hold; no pain  STS 10x; pt reports minor pain in L glute  Standing: Reach toward toes/spinal flexion 10x. No pain Standing extension - 10x. VC/Demo/TC to stay within pain-free range. Pain-free range limited.  Rates back pain 4.5/10 at end of session.    Education provided via VC/demonstration to facilitate improved muscle contraction and movement at target joints with all exercise. Pt exhibits good carryover within session after cuing.    PT Education - 09/25/20 1651    Education Details exercise technique, body mechanics with supine therex    Person(s) Educated Patient    Methods Explanation;Demonstration;Verbal cues    Comprehension Returned demonstration;Verbalized understanding            PT Short Term Goals - 08/24/20 1633      PT SHORT TERM GOAL #1   Title Patient will be independent in home exercise program to improve strength/mobility for better functional independence with ADLs.    Baseline 4/7: to be initiated next appointment    Time 6    Period Weeks    Status New    Target Date 10/05/20      PT SHORT TERM GOAL #2   Title Pt will demonstrate improvement in B shoulder abduction by at least 20 degrees in order to improve ease with pt washing her hair.    Baseline 4/7: L shoulder abduction 120 deg., R shoulder abduction 100 deg.    Time 6    Period Weeks    Status New    Target Date 10/05/20             PT Long Term Goals - 08/28/20 1717      PT LONG TERM GOAL #1   Title Patient will increase FOTO score to equal to or greater than 60 to demonstrate statistically significant improvement in mobility and quality of life.    Baseline  4/7: 51    Time 12    Period Weeks    Status New    Target Date 11/16/20       PT LONG TERM GOAL #2   Title Patient (< 2 years old) will complete five times sit to stand test in < 10 seconds indicating an increased LE strength and improved balance.    Baseline 4/7: 20.8 sec    Time 12    Period Weeks    Status New    Target Date 11/16/20      PT LONG TERM GOAL #3   Title Patient will report a worst L shoulder pain of 5/10 on VAS in last seven days to improve tolerance with ADLs and reduced symptoms with activities.    Baseline 4/7: worst pain 10/10    Time 12    Period Weeks    Status New    Target Date 11/16/20      PT LONG TERM GOAL #4   Title Patient will reduce modified Oswestry score to <20 as to demonstrate minimal disability with ADLs including improved sleeping tolerance, walking/sitting tolerance etc for better mobility with ADLs.    Baseline 4/7: 32%    Time 12    Period Weeks    Status New    Target Date 11/16/20      PT LONG TERM GOAL #5   Title Patient will decrease Quick DASH score by > 8 points demonstrating reduced self-reported upper extremity disability.    Baseline 4/7: 52%    Time 12    Period Weeks    Status New    Target Date 11/16/20      Additional Long Term Goals   Additional Long Term Goals Yes      PT LONG TERM GOAL #6   Title Pt will demonstrate an increase in B hip flexor, B hip abductor, and B knee flexor strength by at least 1/2 point on the MMT to indicate improved strength to increase ease with all functional mobility.    Baseline 4/7: B hip flexion 4/5, B hip abductoin 4/5, B knee flexion 4/5    Time 12    Period Weeks    Status New    Target Date 11/16/20      PT LONG TERM GOAL #7   Title Patient will reduce timed up and go to <11 seconds to reduce fall risk and demonstrate improved transfer/gait ability.    Baseline 4/11: 15.5 sec    Time 11    Period Weeks    Status New    Target Date 11/16/20                 Plan - 09/25/20 1652    Clinical Impression Statement Session focused on therex within  pt pain-free range/pain tolerance as pt reported increased LBP after previous session. Pt has no increases in pain from baseline with flexion-based exercise, but limited pain-free range with extension-based movements. By end of session LBP decreased from 6/10 to 4.5/10. The pt will benefit from further skilled therapy to improve pain, ROM, and strength to increase QOL and ease with functional mobility.    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, polyneuropathy, smokes, R TKR (02/01/2015?), L TKA (02/10/2019)  Examination-Activity Limitations Bathing;Bed Mobility;Bend;Caring for Others;Carry;Dressing;Hygiene/Grooming;Lift;Locomotion Level;Reach Overhead;Self Feeding;Sit;Sleep;Squat;Stairs;Stand;Toileting;Transfers    Examination-Participation Restrictions Cleaning;Laundry;Yard Work;Medication Management;Community Activity;Driving;Meal Prep;Shop;Volunteer    Stability/Clinical Decision Making Evolving/Moderate complexity    Rehab Potential Fair    PT Frequency 2x / week    PT Duration 12 weeks    PT Treatment/Interventions ADLs/Self Care Home Management;Aquatic Therapy;Biofeedback;Cryotherapy;Electrical Stimulation;Moist Heat;Traction;Ultrasound;Parrafin;DME Instruction;Gait training;Stair training;Functional mobility training;Therapeutic activities;Therapeutic exercise;Balance training;Neuromuscular re-education;Patient/family education;Orthotic Fit/Training;Manual techniques;Wheelchair mobility training;Passive range of motion;Dry needling;Energy  conservation;Taping;Splinting;Joint Manipulations    PT Next Visit Plan Further balance assessment, initiate HEP and strengthening and mobility exercises; nerve flossing LLE, practice meal-prep in kitchen/body mechanics, continue plan as previously indicated    PT Home Exercise Plan access code: [email or web address]    Consulted and Agree with Plan of Care Patient           Patient will benefit from skilled therapeutic intervention in order to improve the following deficits and impairments:  Abnormal gait,Decreased activity tolerance,Decreased endurance,Decreased knowledge of use of DME,Decreased range of motion,Decreased strength,Hypomobility,Increased fascial restricitons,Impaired sensation,Impaired UE functional use,Improper body mechanics,Pain,Cardiopulmonary status limiting activity,Decreased balance,Decreased coordination,Decreased mobility,Difficulty walking,Increased muscle spasms,Impaired flexibility,Postural dysfunction,Obesity  Visit Diagnosis: Muscle weakness (generalized)  Chronic bilateral low back pain, unspecified whether sciatica present  Other abnormalities of gait and mobility     Problem List Patient Active Problem List   Diagnosis Date Noted  . Noncompliance with treatment regimen 05/01/2020  . Pharmacologic therapy 11/30/2019  . Bipolar disorder, in full remission, most recent episode mixed (HCC) 11/15/2019  . Bipolar 1 disorder, mixed, moderate (HCC) 06/28/2019  . High risk medication use 06/23/2019  . Bipolar disorder, in full remission, most recent episode depressed (HCC) 06/09/2019  . Total knee replacement status 02/10/2019  . Abnormal ECG 02/09/2019  . Panic attacks 02/03/2019  . GAD (generalized anxiety disorder) 11/25/2018  . Cannabis use disorder, moderate, dependence (HCC) 11/25/2018  . Tobacco use disorder 11/25/2018  . Chronic musculoskeletal pain 07/29/2018  . Hypercholesteremia 08/26/2017  . 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  Chronic sacroiliac joint pain (Primary Source of Pain) (Bilateral) (L>R) 07/09/2016  . Lumbar facet syndrome (Bilateral) (L>R) 07/09/2016  . Neurogenic pain 07/09/2016  . Osteoarthritis 07/09/2016  . Chronic knee pain after total knee replacement (Right) 07/09/2016  . Marijuana use 06/25/2016  . Bipolar I disorder, most recent episode depressed (HCC) 05/06/2016  . BP (high blood pressure) 05/06/2016  . Irregular cardiac rhythm 05/06/2016  . Extreme obesity 05/06/2016  . Long term current use of anticoagulant therapy (Lovenox) 05/06/2016  . Long term current use of opiate analgesic 05/06/2016  . Long term prescription opiate use 05/06/2016  . Opiate use 05/06/2016  . Opioid dependence, uncomplicated (HCC) 05/06/2016  . Chronic hip pain (Primary Area of Pain) (Bilateral) (L>R) 05/06/2016  . Chronic neck pain (Bilateral) (L>R) 05/06/2016  . Chronic knee pain (Bilateral) (L>R) 05/06/2016  . Radiculopathy of cervical region (B)(R>L)(Thumb + Index fingers) 12/21/2015  . Chronic shoulder pain (Third area of Pain) (Bilateral) (L>R) 12/21/2015  . S/P TKR Arthroplasty (Right) 07/13/2015  . Osteoarthritis of knee (Left) 11/17/2014  . DDD (degenerative disc disease), lumbar 08/30/2014  . Lumbar canal stenosis (L3-4 and L4-5) 05/23/2014  . Anxiety and depression 04/25/2014  . Fibromyalgia 04/25/2014  . Personal history of perinatal problems 04/25/2014  . Chronic low back pain (Secondary area of Pain) (Bilateral) (L>R) 04/25/2014  . Vitamin D deficiency 04/25/2014  . H/O syncope 04/25/2014  . Hyperlipidemia 04/25/2014  . Breathlessness on exertion 04/20/2014  . PVC's (premature ventricular contractions) 04/08/2014  .  Asthma 03/26/2014  . Cardiac conduction disorder 03/26/2014  . Hypercholesterolemia without hypertriglyceridemia 03/26/2014  . Chronic pain syndrome 12/30/2013  . Cervical post-laminectomy syndrome (Anterior-posterior approach) (2014) 12/30/2013  . Major depressive disorder,  single episode 05/08/2013  . Depression 05/08/2013  . Thoracic or lumbosacral neuritis or radiculitis 05/07/2012   Temple Pacini PT, DPT 09/25/2020, 5:01 PM  [facility] MAIN [facility] SERVICES [address] [place], [place], [address] Phone: [phone]   Fax:  [phone]  Name: Shelly Wright MRN: [id number] Date of Birth: [birth year]

## 2020-09-26 ENCOUNTER — Encounter: Payer: Self-pay | Admitting: Psychiatry

## 2020-09-26 ENCOUNTER — Other Ambulatory Visit
Admission: RE | Admit: 2020-09-26 | Discharge: 2020-09-26 | Disposition: A | Discharge status: Home / Self Care | Payer: Medicaid Other | Source: Ambulatory Visit | Attending: Psychiatry | Admitting: Psychiatry

## 2020-09-26 ENCOUNTER — Ambulatory Visit (INDEPENDENT_AMBULATORY_CARE_PROVIDER_SITE_OTHER): Payer: Medicaid Other | Admitting: Psychiatry

## 2020-09-26 VITALS — BP 127/84 | HR 71 | Ht 65.0 in | Wt 244.6 lb

## 2020-09-26 DIAGNOSIS — Z79899 Other long term (current) drug therapy: Secondary | ICD-10-CM

## 2020-09-26 DIAGNOSIS — F3178 Bipolar disorder, in full remission, most recent episode mixed: Secondary | ICD-10-CM

## 2020-09-26 DIAGNOSIS — F1220 Cannabis dependence, uncomplicated: Secondary | ICD-10-CM

## 2020-09-26 DIAGNOSIS — Z5181 Encounter for therapeutic drug level monitoring: Secondary | ICD-10-CM | POA: Diagnosis present

## 2020-09-26 DIAGNOSIS — F410 Panic disorder [episodic paroxysmal anxiety] without agoraphobia: Secondary | ICD-10-CM | POA: Diagnosis not present

## 2020-09-26 DIAGNOSIS — F411 Generalized anxiety disorder: Secondary | ICD-10-CM | POA: Insufficient documentation

## 2020-09-26 DIAGNOSIS — F17210 Nicotine dependence, cigarettes, uncomplicated: Secondary | ICD-10-CM | POA: Diagnosis not present

## 2020-09-26 DIAGNOSIS — F17200 Nicotine dependence, unspecified, uncomplicated: Secondary | ICD-10-CM

## 2020-09-26 LAB — LIPID PANEL
Cholesterol: 164 mg/dL (ref 0–200)
HDL: 44 mg/dL (ref >40)
LDL Cholesterol: 100 mg/dL — ABNORMAL HIGH (ref 0–99)
Total CHOL/HDL Ratio: 3.7 RATIO
Triglycerides: 99 mg/dL (ref <150)
VLDL: 20 mg/dL (ref 0–40)

## 2020-09-26 LAB — TSH: TSH: 0.696 u[IU]/mL (ref 0.350–4.500)

## 2020-09-26 LAB — HEMOGLOBIN A1C
Hgb A1c MFr Bld: 6.2 % — ABNORMAL HIGH (ref 4.8–5.6)
Mean Plasma Glucose: 131.24 mg/dL

## 2020-09-26 MED ORDER — VENLAFAXINE HCL ER 150 MG PO CP24: 150.0000 mg | ORAL_CAPSULE | Freq: Every day | ORAL | 1 refills | Status: DC

## 2020-09-26 MED ORDER — OXCARBAZEPINE 600 MG PO TABS: ORAL_TABLET | ORAL | 0 refills | Status: DC

## 2020-09-26 MED ORDER — ARIPIPRAZOLE 20 MG PO TABS: 20.0000 mg | ORAL_TABLET | Freq: Every day | ORAL | 0 refills | Status: DC

## 2020-09-26 MED ORDER — TRAZODONE HCL 100 MG PO TABS: 200.0000 mg | ORAL_TABLET | Freq: Every evening | ORAL | 0 refills | Status: DC | PRN

## 2020-09-26 MED ORDER — VENLAFAXINE HCL ER 37.5 MG PO CP24: 37.5000 mg | ORAL_CAPSULE | Freq: Every day | ORAL | 0 refills | Status: DC

## 2020-09-26 NOTE — Progress Notes (Signed)
BH MD OP Progress Note  09/26/2020 10:54 AM Korynn Kenedy Quale  MRN:  [id number]  Chief Complaint:  Chief Complaint    Follow-up; Anxiety     HPI: Shelly Wright is a 50 year old Caucasian female on SSI, has a history of bipolar disorder, panic attacks, GAD, cannabis use disorder, tobacco use disorder, married, was evaluated in office today.  Patient today reports overall she is doing well.  She denies any significant mood swings, mania or depressive symptoms.  She does report she gets anxious on and off more so because of her relationship struggles with her husband.  Patient reports she is currently planning on moving to [place] to be closer to her mother.  Her dad passed away a year ago.  Her stepbrother passed away recently.  She reports her mother hence is grieving and needs her support.  This will be a good change for her since she wants to get away from here.  She reports she is sleeping well.  She continues to have back pain and continues to be on medications for management.  She reports she had quit smoking however started back again.  She is cutting back on cannabis.  Patient denies any suicidality, homicidality or perceptual disturbances.  Patient denies any other concerns today.  Visit Diagnosis:    ICD-10-CM   1. Bipolar disorder, in full remission, most recent episode mixed (HCC)  F31.78 ARIPiprazole (ABILIFY) 20 MG tablet  2. GAD (generalized anxiety disorder)  F41.1 venlafaxine XR (EFFEXOR-XR) 37.5 MG 24 hr capsule    venlafaxine XR (EFFEXOR XR) 150 MG 24 hr capsule    traZODone (DESYREL) 100 MG tablet  3. Panic attacks  F41.0 oxcarbazepine (TRILEPTAL) 600 MG tablet  4. Cannabis use disorder, moderate, dependence (HCC)  F12.20   5. Tobacco use disorder  F17.200   6. High risk medication use  Z79.899     Past Psychiatric History: I have reviewed past psychiatric history from progress note on 09/16/2018.  Past trials of venlafaxine, trazodone, Seroquel, hydroxyzine, Zoloft,  Lexapro, duloxetine, Paxil  Past Medical History:  Past Medical History:  Diagnosis Date  . Acute right-sided low back pain with right-sided sciatica 08/30/2014  . Anxiety   . Arthritis   . Asthma    in past, no current inhalers  . Bipolar disorder (HCC)   . Cervical spinal cord compression (HCC) 05/07/2012  . COPD (chronic obstructive pulmonary disease) (HCC)   . DDD (degenerative disc disease), lumbar   . Depression   . Dyspnea    with exertion  . Dysrhythmia    irregular-h/o in the past  . Fibromyalgia   . Hypertension   . Neuritis or radiculitis due to rupture of lumbar intervertebral disc 05/23/2014  . Periprosthetic fracture around prosthetic knee 07/31/2015  . Restless leg syndrome   . Soft tissue lesion of shoulder region 01/24/2012  . 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 Syncope and collapse 04/08/2014  . Thoracic and lumbosacral neuritis 05/07/2012  . Thoracic neuritis 05/07/2012    Past Surgical History:  Procedure Laterality Date  . ABDOMINAL HYSTERECTOMY    . Cervical Fusion X 2    . JOINT REPLACEMENT Right 01/2016   Dr Gavin Potters  . KNEE ARTHROPLASTY Left 02/10/2019   Procedure: COMPUTER ASSISTED TOTAL KNEE ARTHROPLASTY - RNFA;  Surgeon: Donato Heinz, MD;  Location: [facility];  Service: Orthopedics;  Laterality: Left;  . KNEE ARTHROSCOPY Bilateral  Right knee scope 1998, Left knee scope  . TONSILLECTOMY    . TOTAL KNEE ARTHROPLASTY Right 02/01/2015   Procedure: TOTAL KNEE ARTHROPLASTY;  Surgeon: Erin SonsHarold Kernodle, MD;  Location: [facility];  Service: Orthopedics;  Laterality: Right;  . TUBAL LIGATION      Family Psychiatric History: I have reviewed family psychiatric history from progress note on 09/16/2018  Family History:  Family History  Problem Relation Age of Onset  . Breast cancer Mother 656  . Colon cancer Father   . Heart attack Father   . Bipolar disorder Sister   .  Depression Sister   . Schizophrenia Sister     Social History: Reviewed social history from progress note on 09/16/2018 Social History   Socioeconomic History  . Marital status: Legally Separated    Spouse name: Not on file  . Number of children: 1  . Years of education: Not on file  . Highest education level: High school graduate  Occupational History  . Not on file  Tobacco Use  . Smoking status: Current Every Day Smoker    Packs/day: 1.00    Years: 30.00    Pack years: 30.00    Types: Cigarettes    Last attempt to quit: 06/29/2020    Years since quitting: 0.2  . Smokeless tobacco: Never Used  . Tobacco comment: States she restarted several months back and no interest in quitting at this time.   Vaping Use  . Vaping Use: Never used  Substance and Sexual Activity  . Alcohol use: No  . Drug use: Yes    Types: Marijuana    Comment: every day  . Sexual activity: Not Currently  Other Topics Concern  . Not on file  Social History Narrative  . Not on file   Social Determinants of Health   Financial Resource Strain: Not on file  Food Insecurity: Not on file  Transportation Needs: Not on file  Physical Activity: Not on file  Stress: Not on file  Social Connections: Not on file    Allergies: No Known Allergies  Metabolic Disorder Labs: Lab Results  Component Value Date   HGBA1C 6.2 (H) 09/26/2020   MPG 131.24 09/26/2020   Lab Results  Component Value Date   PROLACTIN 6.4 09/26/2020   Lab Results  Component Value Date   CHOL 164 09/26/2020   TRIG 99 09/26/2020   HDL 44 09/26/2020   CHOLHDL 3.7 09/26/2020   VLDL 20 09/26/2020   LDLCALC 100 (H) 09/26/2020   Lab Results  Component Value Date   TSH 0.696 09/26/2020    Therapeutic Level Labs: No results found for: LITHIUM No results found for: VALPROATE No components found for:  CBMZ  Current Medications: Current Outpatient Medications  Medication Sig Dispense Refill  . ADVAIR DISKUS 250-50 MCG/DOSE  AEPB Inhale 1 puff into the lungs 2 (two) times daily.    Marland Kitchen. albuterol (VENTOLIN HFA) 108 (90 Base) MCG/ACT inhaler Inhale 2 puffs into the lungs 4 (four) times daily.     . busPIRone (BUSPAR) 30 MG tablet Take 1 tablet (30 mg total) by mouth 2 (two) times daily. 180 tablet 1  . celecoxib (CELEBREX) 200 MG capsule Take 1 capsule (200 mg total) by mouth 2 (two) times daily. (Patient taking differently: Take 200 mg by mouth daily.) 60 capsule 1  . hydrochlorothiazide (HYDRODIURIL) 25 MG tablet Take 25 mg by mouth every morning.   1  . lidocaine (LIDODERM) 5 %     . meloxicam (MOBIC) 15  MG tablet Take 15 mg by mouth daily.    Marland Kitchen oxybutynin (DITROPAN-XL) 5 MG 24 hr tablet Take 5 mg by mouth daily.    . simvastatin (ZOCOR) 20 MG tablet Take 20 mg by mouth at bedtime.     Marland Kitchen amoxicillin (AMOXIL) 500 MG capsule Take 500 mg by mouth 3 (three) times daily. (Patient not taking: Reported on 09/26/2020)    . ARIPiprazole (ABILIFY) 20 MG tablet Take 1 tablet (20 mg total) by mouth daily. 90 tablet 0  . gabapentin (NEURONTIN) 800 MG tablet Take 1 tablet (800 mg total) by mouth every 6 (six) hours. 120 tablet 5  . meloxicam (MOBIC) 7.5 MG tablet Take 7.5 mg by mouth 2 (two) times daily.    . methocarbamol (ROBAXIN) 750 MG tablet Take 1 tablet (750 mg total) by mouth 3 (three) times daily. Max: 3/day 90 tablet 5  . oxcarbazepine (TRILEPTAL) 600 MG tablet TAKE ONE TABLET TWICE DAILY FOR MOOD 180 tablet 0  . tiotropium (SPIRIVA HANDIHALER) 18 MCG inhalation capsule Place 18 mcg into inhaler and inhale every morning.     . traZODone (DESYREL) 100 MG tablet Take 2 tablets (200 mg total) by mouth at bedtime as needed for sleep. 180 tablet 0  . venlafaxine XR (EFFEXOR XR) 150 MG 24 hr capsule Take 1 capsule (150 mg total) by mouth daily with breakfast. To be combined with 37.5 mg daily 90 capsule 1  . venlafaxine XR (EFFEXOR-XR) 37.5 MG 24 hr capsule Take 1 capsule (37.5 mg total) by mouth daily with breakfast. Start taking  along with 150 mg 90 capsule 0   No current facility-administered medications for this visit.     Musculoskeletal: Strength & Muscle Tone: UTA Gait & Station: Walks with cane Patient leans: N/A  Psychiatric Specialty Exam: Review of Systems  Musculoskeletal: Positive for back pain.  Psychiatric/Behavioral: Negative for agitation, behavioral problems, confusion, decreased concentration, dysphoric mood, hallucinations, self-injury, sleep disturbance and suicidal ideas. The patient is nervous/anxious. The patient is not hyperactive.   All other systems reviewed and are negative.   Blood pressure 127/84, pulse 71, height 5\' 5"  (1.651 m), weight 244 lb 9.6 oz (110.9 kg), SpO2 91 %.Body mass index is 40.7 kg/m.  General Appearance: Casual  Eye Contact:  Fair  Speech:  Clear and Coherent  Volume:  Normal  Mood:  Anxious  Affect:  Congruent  Thought Process:  Goal Directed and Descriptions of Associations: Intact  Orientation:  Full (Time, Place, and Person)  Thought Content: Logical   Suicidal Thoughts:  No  Homicidal Thoughts:  No  Memory:  Immediate;   Fair Recent;   Fair Remote;   Fair  Judgement:  Fair  Insight:  Fair  Psychomotor Activity:  Normal  Concentration:  Concentration: Fair and Attention Span: Fair  Recall:  of Knowledge: Fair  Language: Fair  Akathisia:  No  Handed:  Right  AIMS (if indicated): done  Assets:  Communication Skills Desire for Improvement Housing Social Support  ADL's:  Intact  Cognition: WNL  Sleep:  Fair   Screenings: AIMS   Flowsheet Row [facility] Visit from 09/26/2020 in [facility]  AIMS Total Score 0    GAD-7   Flowsheet Row Office Visit from 09/26/2020 in [facility]  Total GAD-7 Score 5    PHQ2-9   Flowsheet Row Office Visit from 09/26/2020 in [facility] Office Visit from 11/30/2019 in [facility]  Clinical Support from 08/26/2017 in [facility] Office Visit from 09/11/2016 in [facility] Procedure visit from 08/14/2016 in [facility]  PHQ-2 Total Score 2 0 0 0 0  PHQ-9 Total Score 3 -- -- -- --    Flowsheet Row Office Visit from 09/26/2020 in [facility]  C-SSRS RISK CATEGORY No Risk       Assessment and Plan: Karlye Ihrig Dray is a 51 year old Caucasian female on SSI, has a history of bipolar disorder, tobacco use disorder, COPD was evaluated in office today.  Patient is currently stable although she does have situational anxiety more so because of relationship problems and upcoming relocation.  Plan as noted below.  Plan Bipolar disorder in remission Abilify 20 mg p.o. daily Trileptal as prescribed Trazodone 200 mg p.o. nightly as needed Venlafaxine extended release 187.5 mg p.o. daily  GAD- improving Continue venlafaxine as prescribed BuSpar 30 mg p.o. twice daily Propranolol 10 mg p.o. 3 times daily as needed Trileptal 600 mg p.o. twice daily  Cannabis use disorder moderate-improving Provided counseling  Tobacco use disorder -unstable Provided counseling  High risk medication use-ordered TSH, lipid panel, prolactin, hemoglobin A1c.  Patient to go to lab today.  Also discussed getting an EKG done since the last one was done a long time ago and she is on multiple psychotropic medications.  Patient is moving to [place].  Discussed with her she can be provided 90-day supply.  However if she is not doing well she needs to contact for a sooner appointment.  This note was generated in part or whole with voice recognition software. Voice recognition is usually quite accurate but there are transcription errors that can and very often do occur. I apologize for any typographical errors that were not detected and  corrected.         Jomarie Longs, MD 09/27/2020, 8:40 PM

## 2020-09-27 ENCOUNTER — Ambulatory Visit: Payer: Medicaid Other

## 2020-09-27 ENCOUNTER — Other Ambulatory Visit: Payer: Self-pay

## 2020-09-27 DIAGNOSIS — M545 Low back pain, unspecified: Secondary | ICD-10-CM

## 2020-09-27 DIAGNOSIS — M6281 Muscle weakness (generalized): Secondary | ICD-10-CM | POA: Diagnosis not present

## 2020-09-27 DIAGNOSIS — R2689 Other abnormalities of gait and mobility: Secondary | ICD-10-CM

## 2020-09-27 LAB — PROLACTIN: Prolactin: 6.4 ng/mL (ref 4.8–23.3)

## 2020-09-27 NOTE — Therapy (Signed)
[place] MAIN [facility] SERVICES [address] [place], [place], [address] Phone: [phone]   Fax:  [phone]  Physical Therapy Treatment/ Physical Therapy Progress Note   Dates of reporting period  08/24/20   to   09/27/20  Patient Details  Name: Shelly Wright MRN: [id number] Date of Birth: 04-13-1971 Referring Provider (PT): Mechele Claude, FNP   Encounter Date: 09/27/2020   PT End of Session - 09/27/20 1527    Visit Number 10    Number of Visits 25    Date for PT Re-Evaluation 11/16/20    Authorization Type eval performed 08/24/2020    PT Start Time 1515    PT Stop Time 1558    PT Time Calculation (min) 43 min    Equipment Utilized During Treatment Gait belt    Activity Tolerance Patient tolerated treatment well    Behavior During Therapy [facility] for tasks assessed/performed           Past Medical History:  Diagnosis Date  . Acute right-sided low back pain with right-sided sciatica 08/30/2014  . Anxiety   . Arthritis   . Asthma    in past, no current inhalers  . Bipolar disorder ([place])   . Cervical spinal cord compression ([place]) 05/07/2012  . COPD (chronic obstructive pulmonary disease) ([place])   . DDD (degenerative disc disease), lumbar   . Depression   . Dyspnea    with exertion  . Dysrhythmia    irregular-h/o in the past  . Fibromyalgia   . Hypertension   . Neuritis or radiculitis due to rupture of lumbar intervertebral disc 05/23/2014  . Periprosthetic fracture around prosthetic knee 07/31/2015  . Restless leg syndrome   . Soft tissue lesion of shoulder region 01/24/2012  . 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 Syncope and collapse 04/08/2014  . Thoracic and lumbosacral neuritis 05/07/2012  . Thoracic neuritis 05/07/2012    Past Surgical History:  Procedure Laterality Date  . ABDOMINAL HYSTERECTOMY    . Cervical Fusion X 2    . JOINT REPLACEMENT  Right 01/2016   Dr Jefm Bryant  . KNEE ARTHROPLASTY Left 02/10/2019   Procedure: COMPUTER ASSISTED TOTAL KNEE ARTHROPLASTY - RNFA;  Surgeon: Dereck Leep, MD;  Location: [facility] ORS;  Service: Orthopedics;  Laterality: Left;  . KNEE ARTHROSCOPY Bilateral    Right knee scope 1998, Left knee scope  . TONSILLECTOMY    . TOTAL KNEE ARTHROPLASTY Right 02/01/2015   Procedure: TOTAL KNEE ARTHROPLASTY;  Surgeon: Leanor Kail, MD;  Location: [facility] ORS;  Service: Orthopedics;  Laterality: Right;  . TUBAL LIGATION      There were no vitals filed for this visit.   Subjective Assessment - 09/27/20 1525    Subjective Patient reports she is leaving next week for [place], has not packed yet. Low back pain is consistant since last visit.    Patient is accompained by: Family member   granddaughter (78 y/o)   Pertinent History Pt is a 50 y/o female who presents to PT in transport chair and with SPC. Pt's 49 y/o granddaughter is with her.  The pt reports she has had LBP and B shoulder pain, where L shoulder hurts worse than R, for over ten years. The pt reports she had neck surgery in the 90s, after which her shoulder pain started. She states "it kills me" to raise her shoulders overhead. She reports B shoulder pain  is currently 3/10, worst pain reaches 10/10, and best pain reaches 2/10. She says pain is constant. Lifting her arms makes it worse and is affecting her ability to perform ADLs. The pt says she has not found anything that helps the pain. She is taking gabapentin but reports it has not helped her pain. Pt reports her low back pain travels down L buttock and L LE. Pt's LBP is constant. Pt says sleeping with her "feet up" and rest helps her back pain. She says moving makes it worse, including standing and walking, and leaning backward. Pt sits down to cook and "put the dishes away" due to her back pain. The pt reports she has hx of scoliosis, and disc herniations. She states she has N/T in hands and feet. Denies  and B/B changes. The pt has never had PT. The pt has shower chair and SPC, but no other equipment in her home. The pt reports she lives by herself. 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 Pt reports she is able to sit comfortably for maybe an hour    How long can you stand comfortably? 5-10 minutes    How long can you walk comfortably? 5-10 minutes    Diagnostic tests MRI lumbar spine without contrast 03/01/2020 per chart: "FINDINGS:   Conus medullaris: terminates at approximately L1.   Alignment: Normal.   Marrow Signal: No suspicious lesions.   Sacroiliac Joints: No significant degenerative changes of the visualized SI   joints     Regional Soft Tissues: Unremarkable.     T11-T12: Left sided disc protrusion with moderate bilateral facet   arthropathy and asymmetric right greater than left hypertrophy of the   ligamenta flava resulting in moderate spinal canal stenosis with moderate   spinal canal stenosis and slight deformity of the left ventral aspect of   the spinal cord and right dorsal aspect of the spinal cord. Moderate   bilateral neuroforaminal stenosis.   T12-L1: No significant spinal canal or neuroforaminal stenosis.     L1-L2: Disc bulge with mild bilateral facet arthropathy and hypertrophy of   the ligamenta flava resulting in mild spinal canal stenosis. Mild bilateral   facet arthropathy.   L2-L3: Disc bulge with mild bilateral facet arthropathy and hypertrophy of   the ligamenta flava resulting in moderate spinal canal stenosis. Mild right   neuroforaminal stenosis. Moderate  bilateral facet arthropathy.  L3-L4: Disc bulge with loss of disc height and Schmorl's nodes. In   conjunction with moderate bilateral facet arthropathy and hypertrophy of   the ligamenta flava, this results in severe spinal canal stenosis with   crowding of the cauda equina nerve roots. Severe right neuroforaminal   stenosis. Moderate bilateral facet arthropathy.   L4-L5: Grade 1 anterolisthesis in the setting of severe bilateral facet   arthropathy. In conjunction with a disc bulge, there is severe spinal canal   stenosis with crowding of the cauda equina nerve roots. Severe left and   mild right neuroforaminal stenosis. Severe left and moderate right facet   arthropathy.   L5-S1: No significant spinal canal or neuroforaminal stenosis. Moderate   bilateral facet arthropathy.  IMPRESSION:   Multilevel lumbar spondylosis, as above, resulting in severe spinal canal   stenosis with crowding of the cauda equina nerve roots at L3-L4 and L4-L5. ";  XRAY hip B 3 or 4 veiws with or without pelvis 02/28/2020 per chart: "Radiographs taken today, 4 views of bilateral hips, demonstrate   well-preserved joint spaces of both the left and right hip. No other bony   or soft tissue abnormality is noted.  "    Patient Stated Goals Pt states "I want to do what I want to be able to do." Pt would like to be able to stand and walk comfortably.    Currently in Pain? Yes    Pain Score 5     Pain Location Back    Pain Orientation Left;Right    Pain Descriptors / Indicators Aching;Squeezing    Pain Type Chronic pain    Pain Onset More than a month ago    Pain Frequency Constant                 Goals:  B shoulder abduction for washing hair: WFL  FOTO: 54 %  5x STS: 10.42  L shoulder pain VAS: comes and goes, depends on how she lifts and moves it.  MODI: 38%  Quickdash: 24%  Bilateral hip flexor, abductor, knee flexor strength: 4+/5 TUG: 12 seconds   Treatment:  Supine:  Single knee to chest 30 seconds  each LE Piriformis lengthening figure four stretch 30 second hold each LE LE rotation 10x within 45 degrees rotation for pain reduction and muscle tension reduction  Posterior pelvic tilt with cues for reducing force for optimal pain relief 12x Green swiss ball TrA contraction 3 second hold 15x  Green swiss ball TrA with UE raise 10x each UE ; modified arm raise to two inches form ball   Seated: ice pack applied to back:  sciatic nerve glide 20x each LE Scapular retractions 15x 3 second holds     Patient's condition has the potential to improve in response to therapy. Maximum improvement is yet to be obtained. The anticipated improvement is attainable and reasonable in a generally predictable time.  Patient reports her pain still is limiting at this time.    Patient demonstrates excellent functional increase in transfers as can be seen in sit to stand goal. Pain continues to be area of impairment that limits patient's quality of life at this time however her shoulder goal has been met.  Patient's condition has the potential to improve in response to therapy. Maximum improvement is yet to be obtained. The anticipated improvement is attainable and reasonable in a generally predictable time. The pt will benefit from further skilled therapy to improve pain, ROM, and strength to increase QOL and ease with functional mobility.             PT Education - 09/27/20 1526    Education Details goals,    Person(s) Educated Patient    Methods Explanation;Demonstration;Tactile cues;Verbal cues    Comprehension Verbalized understanding;Verbal cues required;Tactile cues required;Returned demonstration            PT Short Term Goals - 08/24/20 1633      PT SHORT TERM GOAL #1   Title Patient will be independent in home exercise program to improve strength/mobility for better functional independence with ADLs.    Baseline 4/7: to be initiated next appointment    Time 6    Period Weeks     Status New  Target Date 10/05/20      PT SHORT TERM GOAL #2   Title Pt will demonstrate improvement in B shoulder abduction by at least 20 degrees in order to improve ease with pt washing her hair.    Baseline 4/7: L shoulder abduction 120 deg., R shoulder abduction 100 deg.    Time 6    Period Weeks    Status New    Target Date 10/05/20             PT Long Term Goals - 09/27/20 1527      PT LONG TERM GOAL #1   Title Patient will increase FOTO score to equal to or greater than 60 to demonstrate statistically significant improvement in mobility and quality of life.    Baseline 4/7: 51 5/11: 54%    Time 12    Period Weeks    Status Partially Met    Target Date 11/16/20      PT LONG TERM GOAL #2   Title Patient (< 30 years old) will complete five times sit to stand test in < 10 seconds indicating an increased LE strength and improved balance.    Baseline 4/7: 20.8 sec 5/11: 10.42    Time 12    Period Weeks    Status Partially Met    Target Date 11/16/20      PT LONG TERM GOAL #3   Title Patient will report a worst L shoulder pain of 5/10 on VAS in last seven days to improve tolerance with ADLs and reduced symptoms with activities.    Baseline 4/7: worst pain 10/10 5/11: gomes and goes around a 5/10    Time 12    Period Weeks    Status Achieved      PT LONG TERM GOAL #4   Title Patient will reduce modified Oswestry score to <20 as to demonstrate minimal disability with ADLs including improved sleeping tolerance, walking/sitting tolerance etc for better mobility with ADLs.    Baseline 4/7: 32% 5/11: 38%    Time 12    Period Weeks    Status On-going    Target Date 11/16/20      PT LONG TERM GOAL #5   Title Patient will decrease Quick DASH score by > 8 points demonstrating reduced self-reported upper extremity disability.    Baseline 4/7: 52% 5/11: 24%    Time 12    Period Weeks    Status Achieved    Target Date 11/16/20      PT LONG TERM GOAL #6   Title Pt will  demonstrate an increase in B hip flexor, B hip abductor, and B knee flexor strength by at least 1/2 point on the MMT to indicate improved strength to increase ease with all functional mobility.    Baseline 4/7: B hip flexion 4/5, B hip abductoin 4/5, B knee flexion 4/5 5/11: 4+/5    Time 12    Period Weeks    Status Partially Met    Target Date 11/16/20      PT LONG TERM GOAL #7   Title Patient will reduce timed up and go to <11 seconds to reduce fall risk and demonstrate improved transfer/gait ability.    Baseline 4/11: 15.5 sec 5/11: 12 seconds    Time 11    Period Weeks    Status Partially Met    Target Date 11/16/20                 Plan - 09/28/20 3009  Clinical Impression Statement Patient demonstrates excellent functional increase in transfers as can be seen in sit to stand goal. Pain continues to be area of impairment that limits patient's quality of life at this time however her shoulder goal has been met.  Patient's condition has the potential to improve in response to therapy. Maximum improvement is yet to be obtained. The anticipated improvement is attainable and reasonable in a generally predictable time. The pt will benefit from further skilled therapy to improve pain, ROM, and strength to increase QOL and ease with functional mobility.    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, polyneuropathy, smokes, R TKR (02/01/2015?), L TKA (02/10/2019)    Examination-Activity Limitations Bathing;Bed Mobility;Bend;Caring for  Others;Carry;Dressing;Hygiene/Grooming;Lift;Locomotion Level;Reach Overhead;Self Feeding;Sit;Sleep;Squat;Stairs;Stand;Toileting;Transfers    Examination-Participation Restrictions Cleaning;Laundry;Yard Work;Medication Management;Community Activity;Driving;Meal Prep;Shop;Volunteer    Stability/Clinical Decision Making Evolving/Moderate complexity    Rehab Potential Fair    PT Frequency 2x / week    PT Duration 12 weeks    PT Treatment/Interventions ADLs/Self Care Home Management;Aquatic Therapy;Biofeedback;Cryotherapy;Electrical Stimulation;Moist Heat;Traction;Ultrasound;Parrafin;DME Instruction;Gait training;Stair training;Functional mobility training;Therapeutic activities;Therapeutic exercise;Balance training;Neuromuscular re-education;Patient/family education;Orthotic Fit/Training;Manual techniques;Wheelchair mobility training;Passive range of motion;Dry needling;Energy conservation;Taping;Splinting;Joint Manipulations    PT Next Visit Plan Further balance assessment, initiate HEP and strengthening and mobility exercises; nerve flossing LLE, practice meal-prep in kitchen/body mechanics, continue plan as previously indicated    PT Home Exercise Plan access code: [email or web address]    Consulted and Agree with Plan of Care Patient           Patient will benefit from skilled therapeutic intervention in order to improve the following deficits and impairments:  Abnormal gait,Decreased activity tolerance,Decreased endurance,Decreased knowledge of use of DME,Decreased range of motion,Decreased strength,Hypomobility,Increased fascial restricitons,Impaired sensation,Impaired UE functional use,Improper body mechanics,Pain,Cardiopulmonary status limiting activity,Decreased balance,Decreased coordination,Decreased mobility,Difficulty walking,Increased muscle spasms,Impaired flexibility,Postural dysfunction,Obesity  Visit Diagnosis: Muscle weakness (generalized)  Chronic bilateral low back pain, unspecified whether  sciatica present  Other abnormalities of gait and mobility     Problem List Patient Active Problem List   Diagnosis Date Noted  . Noncompliance with treatment regimen 05/01/2020  . Pharmacologic therapy 11/30/2019  . Bipolar disorder, in full remission, most recent episode mixed ([place]) 11/15/2019  . Bipolar 1 disorder, mixed, moderate ([place]) 06/28/2019  . High risk medication use 06/23/2019  . Bipolar disorder, in full remission, most recent episode depressed ([place]) 06/09/2019  . Total knee replacement status 02/10/2019  . Abnormal ECG 02/09/2019  . Panic attacks 02/03/2019  . GAD (generalized anxiety disorder) 11/25/2018  . Cannabis use disorder, moderate, dependence ([place]) 11/25/2018  . Tobacco use disorder 11/25/2018  . Chronic musculoskeletal pain 07/29/2018  . Hypercholesteremia 08/26/2017  . 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 Chronic sacroiliac joint pain (Primary Source of Pain) (Bilateral) (L>R) 07/09/2016  . Lumbar facet syndrome (Bilateral) (L>R) 07/09/2016  . Neurogenic pain 07/09/2016  . Osteoarthritis 07/09/2016  . Chronic knee pain after total knee replacement (Right) 07/09/2016  . Marijuana use 06/25/2016  . Bipolar I disorder, most recent episode depressed ([place]) 05/06/2016  . BP (high blood pressure) 05/06/2016  . Irregular cardiac rhythm 05/06/2016  . Extreme obesity 05/06/2016  . Long term current use of anticoagulant therapy (Lovenox) 05/06/2016  . Long term current use of opiate analgesic 05/06/2016  . Long  term prescription opiate use 05/06/2016  . Opiate use 05/06/2016  . Opioid dependence, uncomplicated ([place]) 47/11/6149  . Chronic hip pain (Primary Area of Pain) (Bilateral) (L>R) 05/06/2016  . Chronic neck pain (Bilateral) (L>R) 05/06/2016  . Chronic knee pain (Bilateral) (L>R) 05/06/2016  . Radiculopathy of cervical region (B)(R>L)(Thumb + Index fingers) 12/21/2015  . Chronic shoulder pain (Third area of Pain) (Bilateral) (L>R)  12/21/2015  . S/P TKR Arthroplasty (Right) 07/13/2015  . Osteoarthritis of knee (Left) 11/17/2014  . DDD (degenerative disc disease), lumbar 08/30/2014  . Lumbar canal stenosis (L3-4 and L4-5) 05/23/2014  . Anxiety and depression 04/25/2014  . Fibromyalgia 04/25/2014  . Personal history of perinatal problems 04/25/2014  . Chronic low back pain (Secondary area of Pain) (Bilateral) (L>R) 04/25/2014  . Vitamin D deficiency 04/25/2014  . H/O syncope 04/25/2014  . Hyperlipidemia 04/25/2014  . Breathlessness on exertion 04/20/2014  . PVC's (premature ventricular contractions) 04/08/2014  . Asthma 03/26/2014  . Cardiac conduction disorder 03/26/2014  . Hypercholesterolemia without hypertriglyceridemia 03/26/2014  . Chronic pain syndrome 12/30/2013  . Cervical post-laminectomy syndrome (Anterior-posterior approach) (2014) 12/30/2013  . Major depressive disorder, single episode 05/08/2013  . Depression 05/08/2013  . Thoracic or lumbosacral neuritis or radiculitis 05/07/2012   Janna Arch, PT, DPT   09/28/2020, 7:16 AM  [place] MAIN [facility] SERVICES [address] [place], [place], [address] Phone: [phone]   Fax:  [phone]  Name: Shelly Wright MRN: [id number] Date of Birth: July 19, 1970

## 2020-10-02 ENCOUNTER — Ambulatory Visit: Payer: Medicaid Other

## 2020-10-04 ENCOUNTER — Other Ambulatory Visit: Payer: Self-pay

## 2020-10-04 ENCOUNTER — Ambulatory Visit: Payer: Medicaid Other

## 2020-10-04 DIAGNOSIS — G8929 Other chronic pain: Secondary | ICD-10-CM

## 2020-10-04 DIAGNOSIS — M6281 Muscle weakness (generalized): Secondary | ICD-10-CM | POA: Diagnosis not present

## 2020-10-04 DIAGNOSIS — M545 Low back pain, unspecified: Secondary | ICD-10-CM

## 2020-10-04 DIAGNOSIS — M25512 Pain in left shoulder: Secondary | ICD-10-CM

## 2020-10-04 DIAGNOSIS — R2681 Unsteadiness on feet: Secondary | ICD-10-CM

## 2020-10-04 DIAGNOSIS — R2689 Other abnormalities of gait and mobility: Secondary | ICD-10-CM

## 2020-10-04 DIAGNOSIS — M25511 Pain in right shoulder: Secondary | ICD-10-CM

## 2020-10-04 NOTE — Therapy (Signed)
[place] MAIN [facility] SERVICES [address] [place], [place], [address] Phone: [phone]   Fax:  [phone]  Physical Therapy Treatment/DISCHARGE NOTE  Patient Details  Name: Shelly Wright MRN: [id number] Date of Birth: 1971/02/23 Referring Provider (PT): Mechele Claude, FNP   Encounter Date: 10/04/2020   PT End of Session - 10/04/20 2000    Visit Number 11    Number of Visits 25    Date for PT Re-Evaluation 11/16/20    Authorization Type eval performed 08/24/2020    PT Start Time 1514    PT Stop Time 1549    PT Time Calculation (min) 35 min    Equipment Utilized During Treatment Gait belt    Activity Tolerance Patient tolerated treatment well    Behavior During Therapy [facility] for tasks assessed/performed           Past Medical History:  Diagnosis Date  . Acute right-sided low back pain with right-sided sciatica 08/30/2014  . Anxiety   . Arthritis   . Asthma    in past, no current inhalers  . Bipolar disorder ([place])   . Cervical spinal cord compression ([place]) 05/07/2012  . COPD (chronic obstructive pulmonary disease) ([place])   . DDD (degenerative disc disease), lumbar   . Depression   . Dyspnea    with exertion  . Dysrhythmia    irregular-h/o in the past  . Fibromyalgia   . Hypertension   . Neuritis or radiculitis due to rupture of lumbar intervertebral disc 05/23/2014  . Periprosthetic fracture around prosthetic knee 07/31/2015  . Restless leg syndrome   . Soft tissue lesion of shoulder region 01/24/2012  . 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 Syncope and collapse 04/08/2014  . Thoracic and lumbosacral neuritis 05/07/2012  . Thoracic neuritis 05/07/2012    Past Surgical History:  Procedure Laterality Date  . ABDOMINAL HYSTERECTOMY    . Cervical Fusion X 2    . JOINT REPLACEMENT Right 01/2016   Dr Jefm Bryant  . KNEE ARTHROPLASTY Left 02/10/2019    Procedure: COMPUTER ASSISTED TOTAL KNEE ARTHROPLASTY - RNFA;  Surgeon: Dereck Leep, MD;  Location: [facility] ORS;  Service: Orthopedics;  Laterality: Left;  . KNEE ARTHROSCOPY Bilateral    Right knee scope 1998, Left knee scope  . TONSILLECTOMY    . TOTAL KNEE ARTHROPLASTY Right 02/01/2015   Procedure: TOTAL KNEE ARTHROPLASTY;  Surgeon: Leanor Kail, MD;  Location: [facility] ORS;  Service: Orthopedics;  Laterality: Right;  . TUBAL LIGATION      There were no vitals filed for this visit.   TREATMENT   THEREX: Ice applied to pt low back superior to and not in contact with lidocaine patch, while pt performed the following in supine (for approx 12 min). Once ice pack remove PT checked pt skin, pt skin appeared WNL. Pt reports no adverse reaction to treatment.    Supine: Lower trunk rotations - 1x20 Piriformis stretch 30 sec each side: VC/TC for technique. Pt reports she feels a "slight pull." SLR 3x10 BLEs Supine Marches 3x10 BLes Supine blue physioball hamstring curls - 2x15 Supine adductor squeezes - 2x20  Seated: Physioball roll-outs forward/backward - 10x  Physioball roll-outs side-to-side - 10x  Seated RTB row 1x15, 2x15 with BTB Seated BTB hip abduction - 3x15; pt rates exercies between easy-medium  Neuro Re-Ed: CGA provided throughout SLB 2x30 sec BLEs, limited use of UE support, mostly without  UE support Tandem stance 2x30 sec BLEs - limited use of UE support  HEP (reviewed): Access Code: [email or web address] URL: [email or web address] Date: 10/04/2020 Prepared by: Ricard Dillon  Exercises Supine Active Straight Leg Raise - 1 x daily - 4 x weekly - 3 sets - 10 reps Supine Lower Trunk Rotation - 1 x daily - 4 x weekly - 2 sets - 20 reps Supine 90/90 Alternating Toe Touch - 1 x daily - 4 x weekly - 3 sets - 10 reps Seated Shoulder Row with Resistance Anchored at Feet - 1 x daily - 4 x weekly - 3 sets - 10 reps Single Leg Stance with Support - 1 x daily - 7 x weekly - 2  sets - 2 reps - 30 hold Standing Tandem Balance with Counter Support - 1 x daily - 7 x weekly - 2 sets - 2 reps - 30 hold Seated Hip Abduction with Resistance - 1 x daily - 4 x weekly - 3 sets - 10 reps     Education provided with VC/TC and demonstration for movement at target joints and to facilitate correct muscle activation with all exercises. Pt exhibits good carryover of technique within session.     PT Education - 10/04/20 2000    Education Details HEP to maintain gains of therapy outside of therapy    Person(s) Educated Patient    Methods Explanation;Demonstration;Tactile cues;Verbal cues;Handout    Comprehension Verbalized understanding;Returned demonstration            PT Short Term Goals - 08/24/20 1633      PT SHORT TERM GOAL #1   Title Patient will be independent in home exercise program to improve strength/mobility for better functional independence with ADLs.    Baseline 4/7: to be initiated next appointment    Time 6    Period Weeks    Status New    Target Date 10/05/20      PT SHORT TERM GOAL #2   Title Pt will demonstrate improvement in B shoulder abduction by at least 20 degrees in order to improve ease with pt washing her hair.    Baseline 4/7: L shoulder abduction 120 deg., R shoulder abduction 100 deg.    Time 6    Period Weeks    Status New    Target Date 10/05/20             PT Long Term Goals - 09/27/20 1527      PT LONG TERM GOAL #1   Title Patient will increase FOTO score to equal to or greater than 60 to demonstrate statistically significant improvement in mobility and quality of life.    Baseline 4/7: 51 5/11: 54%    Time 12    Period Weeks    Status Partially Met    Target Date 11/16/20      PT LONG TERM GOAL #2   Title Patient (< 36 years old) will complete five times sit to stand test in < 10 seconds indicating an increased LE strength and improved balance.    Baseline 4/7: 20.8 sec 5/11: 10.42    Time 12    Period Weeks     Status Partially Met    Target Date 11/16/20      PT LONG TERM GOAL #3   Title Patient will report a worst L shoulder pain of 5/10 on VAS in last seven days to improve tolerance with ADLs and reduced symptoms with activities.    Baseline 4/7: worst pain 10/10  5/11: gomes and goes around a 5/10    Time 12    Period Weeks    Status Achieved      PT LONG TERM GOAL #4   Title Patient will reduce modified Oswestry score to <20 as to demonstrate minimal disability with ADLs including improved sleeping tolerance, walking/sitting tolerance etc for better mobility with ADLs.    Baseline 4/7: 32% 5/11: 38%    Time 12    Period Weeks    Status On-going    Target Date 11/16/20      PT LONG TERM GOAL #5   Title Patient will decrease Quick DASH score by > 8 points demonstrating reduced self-reported upper extremity disability.    Baseline 4/7: 52% 5/11: 24%    Time 12    Period Weeks    Status Achieved    Target Date 11/16/20      PT LONG TERM GOAL #6   Title Pt will demonstrate an increase in B hip flexor, B hip abductor, and B knee flexor strength by at least 1/2 point on the MMT to indicate improved strength to increase ease with all functional mobility.    Baseline 4/7: B hip flexion 4/5, B hip abductoin 4/5, B knee flexion 4/5 5/11: 4+/5    Time 12    Period Weeks    Status Partially Met    Target Date 11/16/20      PT LONG TERM GOAL #7   Title Patient will reduce timed up and go to <11 seconds to reduce fall risk and demonstrate improved transfer/gait ability.    Baseline 4/11: 15.5 sec 5/11: 12 seconds    Time 11    Period Weeks    Status Partially Met    Target Date 11/16/20            Plan - 10/04/20 2001    Clinical Impression Statement First part of session began with supine exercises for pain modulation for pt's low back. Remainder of session dedicated to reviewing extensive HEP provided to pt so pt can maintain gains of therapy outside of therapy as she is moving to  [place] on Friday. Pt was able to return demo of entirety of HEP with good technique. PT also provided education on exercise selection, sets, reps, frequency and to perform exercises within pain-free range. The pt verbalized understanding. Pt to be d/c'd today due to moving out of state, but the pt would benefit from further skilled therapy to improve pain, ROM, strength and balance to decrease fall risk and increase QOL.    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, polyneuropathy, smokes, R TKR (02/01/2015?), L TKA (02/10/2019)    Examination-Activity Limitations Bathing;Bed Mobility;Bend;Caring for Others;Carry;Dressing;Hygiene/Grooming;Lift;Locomotion Level;Reach Overhead;Self Feeding;Sit;Sleep;Squat;Stairs;Stand;Toileting;Transfers    Examination-Participation Restrictions Cleaning;Laundry;Yard Work;Medication Management;Community Activity;Driving;Meal Prep;Shop;Volunteer    Stability/Clinical Decision Making Evolving/Moderate complexity    Rehab Potential Fair    PT Frequency 2x / week    PT Duration 12 weeks    PT Treatment/Interventions ADLs/Self Care Home Management;Aquatic Therapy;Biofeedback;Cryotherapy;Electrical Stimulation;Moist Heat;Traction;Ultrasound;Parrafin;DME Instruction;Gait training;Stair training;Functional mobility training;Therapeutic activities;Therapeutic exercise;Balance training;Neuromuscular re-education;Patient/family education;Orthotic Fit/Training;Manual techniques;Wheelchair mobility training;Passive range of motion;Dry needling;Energy  conservation;Taping;Splinting;Joint Manipulations    PT Next Visit Plan Further balance assessment, initiate HEP and strengthening and mobility exercises; nerve  flossing LLE, practice meal-prep in kitchen/body mechanics, continue plan as previously indicated    PT Home Exercise Plan access code: [email or web address]; 5/18 Access Code: [email or web address]    Consulted and Agree with Plan of Care Patient           Patient will benefit from skilled therapeutic intervention in order to improve the following deficits and impairments:  Abnormal gait,Decreased activity tolerance,Decreased endurance,Decreased knowledge of use of DME,Decreased range of motion,Decreased strength,Hypomobility,Increased fascial restricitons,Impaired sensation,Impaired UE functional use,Improper body mechanics,Pain,Cardiopulmonary status limiting activity,Decreased balance,Decreased coordination,Decreased mobility,Difficulty walking,Increased muscle spasms,Impaired flexibility,Postural dysfunction,Obesity  Visit Diagnosis: Muscle weakness (generalized)  Other abnormalities of gait and mobility  Unsteadiness on feet  Chronic bilateral low back pain, unspecified whether sciatica present  Chronic right shoulder pain  Chronic left shoulder pain     Problem List Patient Active Problem List   Diagnosis Date Noted  . Noncompliance with treatment regimen 05/01/2020  . Pharmacologic therapy 11/30/2019  . Bipolar disorder, in full remission, most recent episode mixed ([place]) 11/15/2019  . Bipolar 1 disorder, mixed, moderate ([place]) 06/28/2019  . High risk medication use 06/23/2019  . Bipolar disorder, in full remission, most recent episode depressed ([place]) 06/09/2019  . Total knee replacement status 02/10/2019  . Abnormal ECG 02/09/2019  . Panic attacks 02/03/2019  . GAD (generalized anxiety disorder) 11/25/2018  . Cannabis use disorder, moderate, dependence ([place]) 11/25/2018  . Tobacco use disorder 11/25/2018  . Chronic musculoskeletal pain  07/29/2018  . Hypercholesteremia 08/26/2017  . 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 Chronic sacroiliac joint pain (Primary Source of Pain) (Bilateral) (L>R) 07/09/2016  . Lumbar facet syndrome (Bilateral) (L>R) 07/09/2016  . Neurogenic pain 07/09/2016  . Osteoarthritis 07/09/2016  . Chronic knee pain after total knee replacement (Right) 07/09/2016  . Marijuana use 06/25/2016  . Bipolar I disorder, most recent episode depressed ([place]) 05/06/2016  . BP (high blood pressure) 05/06/2016  . Irregular cardiac rhythm 05/06/2016  . Extreme obesity 05/06/2016  . Long term current use of anticoagulant therapy (Lovenox) 05/06/2016  . Long term current use of opiate analgesic 05/06/2016  . Long term prescription opiate use 05/06/2016  . Opiate use 05/06/2016  . Opioid dependence, uncomplicated ([place]) 59/56/3875  . Chronic hip pain (Primary Area of Pain) (Bilateral) (L>R) 05/06/2016  . Chronic neck pain (Bilateral) (L>R) 05/06/2016  . Chronic knee pain (Bilateral) (L>R) 05/06/2016  . Radiculopathy of cervical region (B)(R>L)(Thumb + Index fingers) 12/21/2015  . Chronic shoulder pain (Third area of Pain) (Bilateral) (L>R) 12/21/2015  . S/P TKR Arthroplasty (Right) 07/13/2015  . Osteoarthritis of knee (Left) 11/17/2014  . DDD (degenerative disc disease), lumbar 08/30/2014  . Lumbar canal stenosis (L3-4 and L4-5) 05/23/2014  . Anxiety and depression 04/25/2014  . Fibromyalgia 04/25/2014  . Personal history of perinatal problems 04/25/2014  . Chronic low back pain (Secondary area of Pain) (Bilateral) (L>R) 04/25/2014  . Vitamin D deficiency 04/25/2014  . H/O syncope 04/25/2014  . Hyperlipidemia 04/25/2014  . Breathlessness on exertion 04/20/2014  . PVC's (premature ventricular contractions) 04/08/2014  . Asthma 03/26/2014  . Cardiac conduction disorder 03/26/2014  . Hypercholesterolemia without hypertriglyceridemia 03/26/2014  . Chronic pain syndrome 12/30/2013  . 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 Major depressive disorder, single episode 05/08/2013  . Depression 05/08/2013  . Thoracic or lumbosacral neuritis or radiculitis 05/07/2012   Ricard Dillon PT, DPT 10/04/2020, 8:12 PM  [place] MAIN [facility] SERVICES [address] [place], [place], [address] Phone: [phone]  Fax:  [phone]  Name: Zaelyn Noack Kulkarni MRN: [id number] Date of Birth: 08-15-1970

## 2020-10-09 ENCOUNTER — Ambulatory Visit: Payer: Medicaid Other

## 2020-10-11 ENCOUNTER — Ambulatory Visit: Payer: Medicaid Other

## 2020-10-18 ENCOUNTER — Ambulatory Visit: Payer: Medicaid Other

## 2020-10-30 DIAGNOSIS — R32 Unspecified urinary incontinence: Secondary | ICD-10-CM | POA: Diagnosis not present

## 2020-11-21 ENCOUNTER — Other Ambulatory Visit: Payer: Self-pay | Admitting: Psychiatry

## 2020-11-21 ENCOUNTER — Telehealth: Payer: Self-pay | Admitting: *Deleted

## 2020-11-21 DIAGNOSIS — F3178 Bipolar disorder, in full remission, most recent episode mixed: Secondary | ICD-10-CM

## 2020-11-21 DIAGNOSIS — F411 Generalized anxiety disorder: Secondary | ICD-10-CM

## 2020-11-21 DIAGNOSIS — F410 Panic disorder [episodic paroxysmal anxiety] without agoraphobia: Secondary | ICD-10-CM

## 2020-11-21 MED ORDER — BUSPIRONE HCL 30 MG PO TABS: 30.0000 mg | ORAL_TABLET | Freq: Two times a day (BID) | ORAL | 0 refills | Status: DC

## 2020-11-21 MED ORDER — ARIPIPRAZOLE 20 MG PO TABS: 20.0000 mg | ORAL_TABLET | Freq: Every day | ORAL | 0 refills | Status: DC

## 2020-11-21 MED ORDER — VENLAFAXINE HCL ER 150 MG PO CP24
150.0000 mg | ORAL_CAPSULE | Freq: Every day | ORAL | 0 refills | Status: DC
Start: 2020-11-21 — End: 2021-02-06

## 2020-11-21 MED ORDER — TRAZODONE HCL 100 MG PO TABS: 200.0000 mg | ORAL_TABLET | Freq: Every evening | ORAL | 0 refills | Status: DC | PRN

## 2020-11-21 MED ORDER — VENLAFAXINE HCL ER 37.5 MG PO CP24
37.5000 mg | ORAL_CAPSULE | Freq: Every day | ORAL | 0 refills | Status: DC
Start: 2020-11-21 — End: 2021-02-06

## 2020-11-21 NOTE — Telephone Encounter (Signed)
Patient called and LMOM stating she moved to [place] and is now back in [place]. Per pt she is needing refills for all of her medication that Dr. Elna Breslow prescribed for her. Per pt she lost her medications and have not had them since Thursday June 30th. Patient number that was left on voicemail is [phone].

## 2020-11-21 NOTE — Telephone Encounter (Signed)
Ordered for a month.

## 2020-11-21 NOTE — Telephone Encounter (Signed)
LMOM

## 2020-11-27 ENCOUNTER — Ambulatory Visit (INDEPENDENT_AMBULATORY_CARE_PROVIDER_SITE_OTHER): Payer: Medicaid Other | Admitting: Psychiatry

## 2020-11-27 ENCOUNTER — Encounter: Payer: Self-pay | Admitting: Psychiatry

## 2020-11-27 ENCOUNTER — Other Ambulatory Visit: Payer: Self-pay

## 2020-11-27 VITALS — BP 152/78 | HR 87 | Temp 98.5°F | Wt 243.8 lb

## 2020-11-27 DIAGNOSIS — F3178 Bipolar disorder, in full remission, most recent episode mixed: Secondary | ICD-10-CM | POA: Diagnosis not present

## 2020-11-27 DIAGNOSIS — F411 Generalized anxiety disorder: Secondary | ICD-10-CM

## 2020-11-27 DIAGNOSIS — F410 Panic disorder [episodic paroxysmal anxiety] without agoraphobia: Secondary | ICD-10-CM | POA: Diagnosis not present

## 2020-11-27 DIAGNOSIS — F1220 Cannabis dependence, uncomplicated: Secondary | ICD-10-CM | POA: Diagnosis not present

## 2020-11-27 DIAGNOSIS — F17200 Nicotine dependence, unspecified, uncomplicated: Secondary | ICD-10-CM

## 2020-11-27 DIAGNOSIS — Z79899 Other long term (current) drug therapy: Secondary | ICD-10-CM

## 2020-11-27 DIAGNOSIS — R030 Elevated blood-pressure reading, without diagnosis of hypertension: Secondary | ICD-10-CM

## 2020-11-27 MED ORDER — OXCARBAZEPINE 600 MG PO TABS: ORAL_TABLET | ORAL | 0 refills | Status: DC

## 2020-11-27 NOTE — Progress Notes (Signed)
BH MD OP Progress Note  11/27/2020 3:40 PM Shelly Wright  MRN:  [id number]  Chief Complaint:  Chief Complaint   Follow-up; Anxiety; Depression    HPI: Shelly Wright is a 50 year old Caucasian female on SSI, has a history of bipolar disorder, panic attacks, GAD, cannabis use disorder, tobacco use disorder, married was evaluated in the office today.  Patient today presented along with her 3 grandchildren.  Patient reports she returned back to her husband since she did not like her stay at [place].  Patient had discussed that she was going to separate from her husband and was planning to move to [place] to be closer to her mother last visit.  Patient reports her husband is currently making changes to accommodate her needs and is trying to be more thoughtful and compassionate.  Patient reports she is also trying to make some changes and is trying to spend time with her grandkids who are currently out of school for the summer break.  That does help her and keep her occupied.  She does have good support system from her daughter.  Patient reports she is compliant on her medications and that helps her with her mood.  She denies any side effects.  She reports sleep as good.  She denies any suicidality, homicidality or perceptual disturbances.  She continues to smoke cannabis however reports she is trying to cut back.  She smokes 2-3 times a day and reports she had a bowl this morning.  Patient continues to smoke cigarettes.  Reviewed and discussed most recent labs including her elevated hemoglobin A1c.  Visit Diagnosis:    ICD-10-CM   1. Bipolar disorder, in full remission, most recent episode mixed (HCC)  F31.78     2. GAD (generalized anxiety disorder)  F41.1     3. Panic disorder  F41.0 oxcarbazepine (TRILEPTAL) 600 MG tablet    4. Cannabis use disorder, moderate, dependence (HCC)  F12.20     5. Tobacco use disorder  F17.200     6. High risk medication use  Z79.899     7. Elevated  blood pressure reading  R03.0       Past Psychiatric History: I have reviewed past psychiatric history from progress note on 09/16/2018.  Past trials of venlafaxine, trazodone, Seroquel, hydroxyzine, Zoloft, Lexapro, duloxetine, Paxil  Past Medical History:  Past Medical History:  Diagnosis Date   Acute right-sided low back pain with right-sided sciatica 08/30/2014   Anxiety    Arthritis    Asthma    in past, no current inhalers   Bipolar disorder (HCC)    Cervical spinal cord compression (HCC) 05/07/2012   COPD (chronic obstructive pulmonary disease) (HCC)    DDD (degenerative disc disease), lumbar    Depression    Dyspnea    with exertion   Dysrhythmia    irregular-h/o in the past   Fibromyalgia    Hypertension    Neuritis or radiculitis due to rupture of lumbar intervertebral disc 05/23/2014   Periprosthetic fracture around prosthetic knee 07/31/2015   Restless leg syndrome    Soft tissue lesion of shoulder region 01/24/2012   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   Syncope and collapse 04/08/2014   Thoracic and lumbosacral neuritis 05/07/2012   Thoracic neuritis 05/07/2012    Past Surgical History:  Procedure Laterality Date   ABDOMINAL HYSTERECTOMY     Cervical Fusion X 2     JOINT  REPLACEMENT Right 01/2016   Dr Gavin Potters   KNEE ARTHROPLASTY Left 02/10/2019   Procedure: COMPUTER ASSISTED TOTAL KNEE ARTHROPLASTY - RNFA;  Surgeon: Donato Heinz, MD;  Location: [facility];  Service: Orthopedics;  Laterality: Left;   KNEE ARTHROSCOPY Bilateral    Right knee scope 1998, Left knee scope   TONSILLECTOMY     TOTAL KNEE ARTHROPLASTY Right 02/01/2015   Procedure: TOTAL KNEE ARTHROPLASTY;  Surgeon: Erin Sons, MD;  Location: [facility];  Service: Orthopedics;  Laterality: Right;   TUBAL LIGATION      Family Psychiatric History: Reviewed family psychiatric history from progress note on 09/16/2018  Family  History:  Family History  Problem Relation Age of Onset   Breast cancer Mother 82   Colon cancer Father    Heart attack Father    Bipolar disorder Sister    Depression Sister    Schizophrenia Sister     Social History: Reviewed social history from progress note on 09/16/2018 Social History   Socioeconomic History   Marital status: Legally Separated    Spouse name: Not on file   Number of children: 1   Years of education: Not on file   Highest education level: High school graduate  Occupational History   Not on file  Tobacco Use   Smoking status: Every Day    Packs/day: 1.00    Years: 30.00    Pack years: 30.00    Types: Cigarettes    Last attempt to quit: 06/29/2020    Years since quitting: 0.4   Smokeless tobacco: Never   Tobacco comments:    States she restarted several months back and no interest in quitting at this time.   Vaping Use   Vaping Use: Never used  Substance and Sexual Activity   Alcohol use: No   Drug use: Yes    Types: Marijuana    Comment: every day   Sexual activity: Not Currently  Other Topics Concern   Not on file  Social History Narrative   Not on file   Social Determinants of Health   Financial Resource Strain: Not on file  Food Insecurity: Not on file  Transportation Needs: Not on file  Physical Activity: Not on file  Stress: Not on file  Social Connections: Not on file    Allergies: No Known Allergies  Metabolic Disorder Labs: Lab Results  Component Value Date   HGBA1C 6.2 (H) 09/26/2020   MPG 131.24 09/26/2020   Lab Results  Component Value Date   PROLACTIN 6.4 09/26/2020   Lab Results  Component Value Date   CHOL 164 09/26/2020   TRIG 99 09/26/2020   HDL 44 09/26/2020   CHOLHDL 3.7 09/26/2020   VLDL 20 09/26/2020   LDLCALC 100 (H) 09/26/2020   Lab Results  Component Value Date   TSH 0.696 09/26/2020    Therapeutic Level Labs: No results found for: LITHIUM No results found for: VALPROATE No components found  for:  CBMZ  Current Medications: Current Outpatient Medications  Medication Sig Dispense Refill   ADVAIR DISKUS 250-50 MCG/DOSE AEPB Inhale 1 puff into the lungs 2 (two) times daily.     albuterol (VENTOLIN HFA) 108 (90 Base) MCG/ACT inhaler Inhale 2 puffs into the lungs 4 (four) times daily.      ARIPiprazole (ABILIFY) 20 MG tablet Take 1 tablet (20 mg total) by mouth daily. 30 tablet 0   busPIRone (BUSPAR) 30 MG tablet Take 1 tablet (30 mg total) by mouth 2 (two) times daily. 180  tablet 0   celecoxib (CELEBREX) 200 MG capsule Take 1 capsule (200 mg total) by mouth 2 (two) times daily. (Patient taking differently: Take 200 mg by mouth daily.) 60 capsule 1   hydrochlorothiazide (HYDRODIURIL) 25 MG tablet Take 25 mg by mouth every morning.   1   lidocaine (LIDODERM) 5 %      meloxicam (MOBIC) 7.5 MG tablet Take 7.5 mg by mouth 2 (two) times daily.     oxybutynin (DITROPAN-XL) 5 MG 24 hr tablet Take 5 mg by mouth daily.     simvastatin (ZOCOR) 20 MG tablet Take 20 mg by mouth at bedtime.      traMADol (ULTRAM) 50 MG tablet Take 50 mg by mouth every 6 (six) hours as needed.     traZODone (DESYREL) 100 MG tablet Take 2 tablets (200 mg total) by mouth at bedtime as needed for sleep. 60 tablet 0   venlafaxine XR (EFFEXOR XR) 150 MG 24 hr capsule Take 1 capsule (150 mg total) by mouth daily with breakfast. To be combined with 37.5 mg daily 30 capsule 0   venlafaxine XR (EFFEXOR-XR) 37.5 MG 24 hr capsule Take 1 capsule (37.5 mg total) by mouth daily with breakfast. Start taking along with 150 mg 30 capsule 0   gabapentin (NEURONTIN) 800 MG tablet Take 1 tablet (800 mg total) by mouth every 6 (six) hours. 120 tablet 5   methocarbamol (ROBAXIN) 750 MG tablet Take 1 tablet (750 mg total) by mouth 3 (three) times daily. Max: 3/day 90 tablet 5   oxcarbazepine (TRILEPTAL) 600 MG tablet TAKE ONE TABLET TWICE DAILY FOR MOOD 180 tablet 0   tiotropium (SPIRIVA HANDIHALER) 18 MCG inhalation capsule Place 18 mcg  into inhaler and inhale every morning.      No current facility-administered medications for this visit.     Musculoskeletal: Strength & Muscle Tone: within normal limits Gait & Station: normal Patient leans: N/A  Psychiatric Specialty Exam: Review of Systems  Musculoskeletal:  Positive for back pain.       Shoulder - rt sided pain  Psychiatric/Behavioral:  The patient is nervous/anxious.   All other systems reviewed and are negative.  Blood pressure (!) 152/78, pulse 87, temperature 98.5 F (36.9 C), temperature source Temporal, weight 243 lb 12.8 oz (110.6 kg).Body mass index is 40.57 kg/m.  General Appearance: Casual  Eye Contact:  Good  Speech:  Clear and Coherent  Volume:  Normal  Mood:  Anxious coping well  Affect:  Appropriate  Thought Process:  Goal Directed and Descriptions of Associations: Intact  Orientation:  Full (Time, Place, and Person)  Thought Content: Logical   Suicidal Thoughts:  No  Homicidal Thoughts:  No  Memory:  Immediate;   Fair Recent;   Fair Remote;   Fair  Judgement:  Fair  Insight:  Shallow  Psychomotor Activity:  Normal  Concentration:  Concentration: Fair and Attention Span: Fair  Recall:  [employer] of Knowledge: Fair  Language: Fair  Akathisia:  No  Handed:  Right  AIMS (if indicated): done  Assets:  Communication Skills Desire for Improvement Housing Intimacy Social Support Talents/Skills Transportation  ADL's:  Intact  Cognition: WNL  Sleep:  Fair   Screenings: AIMS    Flowsheet Row Office Visit from 11/27/2020 in [facility] Office Visit from 09/26/2020 in [facility]  AIMS Total Score 0 0      GAD-7    Flowsheet Row Office Visit from 11/27/2020 in [facility] Office Visit from 09/26/2020 in [facility]  Total GAD-7 Score 4 5      PHQ2-9    Flowsheet Row Office Visit from 11/27/2020 in [facility] Office Visit from 09[facility]26/2020 in [facility] Office [facility] from [facility]30[facility]2019 [facility] Clinical Support from 08[facility]26/2017 in [facility] Office Visit from 09/11/2016 in [facility]  PHQ-2 Total Score 2 2 0 0 0  PHQ-9 Total Score 4 3 -- -- --      Flowsheet Row [facility]2020 [facility] Office Visit from 09/26/2020 in [facility]  C-SSRS RISK CATEGORY No Risk No Risk        Assessment and Plan: Valentino SaxonValerie D Allers is a 50 year old Caucasian female on SSI, has a history of bipolar disorder, tobacco use disorder, COPD was evaluated in office today.  Patient is currently making progress with regards to her mood and reports relationship struggles as improving.  Patient however continues to have cannabis use disorder as well as tobacco use disorder and is not willing to quit.  Discussed plan as noted below.  Plan Bipolar disorder in remission Abilify 20 mg p.o. daily Trileptal 600 mg p.o. twice daily Trazodone 200 mg p.o. nightly as needed Venlafaxine extended release 187.5 mg p.o. daily  GAD-stable Venlafaxine as prescribed BuSpar 30 mg p.o. twice daily Propranolol 10 mg p.o. 3 times daily as needed Trileptal 600 mg p.o. twice daily  Cannabis use disorder moderate-unstable Provided counseling.  Tobacco use disorder-unstable Provided counseling  High risk medication use-reviewed and discussed labs dated 09/26/2020-lipid panel within normal limits except for LDL cholesterol elevated at 100-patient advised to manage her diet and follow up with primary care provider, prolactin-6.4, hemoglobin A1c-elevated at 6.2-discussed diet management.  Also discussed reducing Abilify dosage-patient is not interested.  TSH-within normal limits at  0.696.  EKG ordered-pending  Elevated blood pressure reading-discussed her  elevated blood pressure reading at the office today.  Patient reports she has been monitoring her blood pressure at home and it has been stable.  Patient advised to follow up with primary care provider if it continues to stay high.  Provided supportive psychotherapy, discussed with patient to establish care with a therapist.  Patient is not interested at this time.  Will reassess in future sessions.  Follow-up in clinic in 2 to 3 months or sooner in office.  I have spent atleast 30 minutes face to face with patient today which includes the time spent for preparing to see the patient ( e.g., review of test, records ), obtaining and to review and separately obtained history , ordering medications and test ,psychoeducation and supportive psychotherapy as well as documenting clinical information in electronic health record,interpreting and communication of test results   This note was generated in part or whole with voice recognition software. Voice recognition is usually quite accurate but there are transcription errors that can and very often do occur. I apologize for any typographical errors that were not detected and corrected.      Jomarie LongsSaramma Taron Mondor, MD 11/27/2020, 3:41 PM

## 2021-01-18 ENCOUNTER — Other Ambulatory Visit: Payer: Self-pay | Admitting: Psychiatry

## 2021-01-18 DIAGNOSIS — F3178 Bipolar disorder, in full remission, most recent episode mixed: Secondary | ICD-10-CM

## 2021-02-06 ENCOUNTER — Telehealth: Payer: Self-pay | Admitting: Psychiatry

## 2021-02-06 DIAGNOSIS — F411 Generalized anxiety disorder: Secondary | ICD-10-CM

## 2021-02-06 MED ORDER — VENLAFAXINE HCL ER 150 MG PO CP24: 150.0000 mg | ORAL_CAPSULE | Freq: Every day | ORAL | 0 refills | Status: DC

## 2021-02-06 MED ORDER — VENLAFAXINE HCL ER 37.5 MG PO CP24: 37.5000 mg | ORAL_CAPSULE | Freq: Every day | ORAL | 0 refills | Status: DC

## 2021-02-06 NOTE — Telephone Encounter (Signed)
I have sent venlafaxine to pharmacy. 

## 2021-02-06 NOTE — Telephone Encounter (Signed)
received fax requesting a refill on the effexor xr 37.5mg 

## 2021-02-13 ENCOUNTER — Ambulatory Visit
Admission: RE | Admit: 2021-02-13 | Discharge: 2021-02-13 | Disposition: A | Discharge status: Home / Self Care | Payer: Medicaid Other | Source: Ambulatory Visit | Attending: Psychiatry | Admitting: Psychiatry

## 2021-02-13 ENCOUNTER — Other Ambulatory Visit: Payer: Self-pay

## 2021-02-13 ENCOUNTER — Ambulatory Visit (INDEPENDENT_AMBULATORY_CARE_PROVIDER_SITE_OTHER): Payer: Medicaid Other | Admitting: Psychiatry

## 2021-02-13 ENCOUNTER — Telehealth: Payer: Self-pay

## 2021-02-13 ENCOUNTER — Encounter: Payer: Self-pay | Admitting: Psychiatry

## 2021-02-13 ENCOUNTER — Other Ambulatory Visit
Admission: RE | Admit: 2021-02-13 | Discharge: 2021-02-13 | Disposition: A | Discharge status: Home / Self Care | Payer: Medicaid Other | Source: Home / Self Care | Attending: Psychiatry | Admitting: Psychiatry

## 2021-02-13 VITALS — BP 126/75 | HR 76 | Temp 98.5°F | Wt 239.0 lb

## 2021-02-13 DIAGNOSIS — Z79899 Other long term (current) drug therapy: Secondary | ICD-10-CM

## 2021-02-13 DIAGNOSIS — F1220 Cannabis dependence, uncomplicated: Secondary | ICD-10-CM | POA: Diagnosis not present

## 2021-02-13 DIAGNOSIS — F411 Generalized anxiety disorder: Secondary | ICD-10-CM | POA: Diagnosis not present

## 2021-02-13 DIAGNOSIS — F410 Panic disorder [episodic paroxysmal anxiety] without agoraphobia: Secondary | ICD-10-CM | POA: Diagnosis not present

## 2021-02-13 DIAGNOSIS — F3178 Bipolar disorder, in full remission, most recent episode mixed: Secondary | ICD-10-CM | POA: Insufficient documentation

## 2021-02-13 DIAGNOSIS — Z9189 Other specified personal risk factors, not elsewhere classified: Secondary | ICD-10-CM

## 2021-02-13 DIAGNOSIS — F17200 Nicotine dependence, unspecified, uncomplicated: Secondary | ICD-10-CM

## 2021-02-13 LAB — HEPATIC FUNCTION PANEL
ALT: 13 U/L (ref 0–44)
AST: 17 U/L (ref 15–41)
Albumin: 4.1 g/dL (ref 3.5–5.0)
Alkaline Phosphatase: 88 U/L (ref 38–126)
Bilirubin, Direct: 0.1 mg/dL (ref 0.0–0.2)
Indirect Bilirubin: 0.5 mg/dL (ref 0.3–0.9)
Total Bilirubin: 0.6 mg/dL (ref 0.3–1.2)
Total Protein: 7 g/dL (ref 6.5–8.1)

## 2021-02-13 MED ORDER — OXCARBAZEPINE 600 MG PO TABS: ORAL_TABLET | ORAL | 0 refills | Status: DC

## 2021-02-13 MED ORDER — VENLAFAXINE HCL ER 150 MG PO CP24: 150.0000 mg | ORAL_CAPSULE | Freq: Every day | ORAL | 0 refills | Status: DC

## 2021-02-13 MED ORDER — TRAZODONE HCL 100 MG PO TABS: 200.0000 mg | ORAL_TABLET | Freq: Every evening | ORAL | 0 refills | Status: DC | PRN

## 2021-02-13 MED ORDER — BUSPIRONE HCL 30 MG PO TABS: 30.0000 mg | ORAL_TABLET | Freq: Two times a day (BID) | ORAL | 0 refills | Status: DC

## 2021-02-13 MED ORDER — ARIPIPRAZOLE 20 MG PO TABS: 20.0000 mg | ORAL_TABLET | Freq: Every day | ORAL | 0 refills | Status: DC

## 2021-02-13 MED ORDER — VENLAFAXINE HCL ER 37.5 MG PO CP24: 37.5000 mg | ORAL_CAPSULE | Freq: Every day | ORAL | 0 refills | Status: DC

## 2021-02-13 NOTE — Progress Notes (Signed)
BH MD OP Progress Note  02/13/2021 5:18 PM Brielyn Bosak Connery  MRN:  [id number]  Chief Complaint:  Chief Complaint   Follow-up; Anxiety; Depression    HPI: Shelly Wright is a 50 year old Caucasian female on SSI, has a history of bipolar disorder, GAD, panic disorder, cannabis use disorder, tobacco use disorder, married was evaluated in office today.  Patient today reports she is currently doing well with regards to her mood.  Denies any significant mood lability.  Reports anxiety symptoms are under control on the current medication regimen.  She reports sleep is overall good.  She does take the trazodone.  Denies side effects to medications.  Denies suicidality, homicidality or perceptual disturbances.  Patient reports she gets frustrated with her husband often since he does not do any chores around the house to help her out.  She however has been coping okay and is working on her relationship.  Patient reports she has restarted smoking cigarettes as well as cannabis.  She currently smokes 3 joints of cannabis per day.  She reports she is not willing to cut back at this time.  She is in a lot of pain and she believes that cannabis helps her with her anxiety and pain.  She is also with the pain provider and was recently started on opioids.  She has regular follow-up visits with them.  Patient denies any other concerns today.  Visit Diagnosis:    ICD-10-CM   1. Bipolar disorder, in full remission, most recent episode mixed (HCC)  F31.78 ARIPiprazole (ABILIFY) 20 MG tablet    EKG 12-Lead    2. GAD (generalized anxiety disorder)  F41.1 traZODone (DESYREL) 100 MG tablet    busPIRone (BUSPAR) 30 MG tablet    venlafaxine XR (EFFEXOR-XR) 37.5 MG 24 hr capsule    venlafaxine XR (EFFEXOR XR) 150 MG 24 hr capsule    3. Panic disorder  F41.0 oxcarbazepine (TRILEPTAL) 600 MG tablet    4. Cannabis use disorder, moderate, dependence (HCC)  F12.20     5. Tobacco use disorder  F17.200     6. High  risk medication use  Z79.899 EKG 12-Lead    Platelet count    Hepatic function panel    Sodium    7. At risk for prolonged QT interval syndrome  Z91.89       Past Psychiatric History: Reviewed past psychiatric history from progress note on 09/16/2018.  Past trials of venlafaxine, trazodone, Seroquel, hydroxyzine, Zoloft, Lexapro, duloxetine, Paxil  Past Medical History:  Past Medical History:  Diagnosis Date   Acute right-sided low back pain with right-sided sciatica 08/30/2014   Anxiety    Arthritis    Asthma    in past, no current inhalers   Bipolar disorder (HCC)    Cervical spinal cord compression (HCC) 05/07/2012   COPD (chronic obstructive pulmonary disease) (HCC)    DDD (degenerative disc disease), lumbar    Depression    Dyspnea    with exertion   Dysrhythmia    irregular-h/o in the past   Fibromyalgia    Hypertension    Neuritis or radiculitis due to rupture of lumbar intervertebral disc 05/23/2014   Periprosthetic fracture around prosthetic knee 07/31/2015   Restless leg syndrome    Soft tissue lesion of shoulder region 01/24/2012   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   Syncope and collapse 04/08/2014   Thoracic and lumbosacral neuritis 05/07/2012   Thoracic  neuritis 05/07/2012    Past Surgical History:  Procedure Laterality Date   ABDOMINAL HYSTERECTOMY     Cervical Fusion X 2     JOINT REPLACEMENT Right 01/2016   Dr Gavin Potters   KNEE ARTHROPLASTY Left 02/10/2019   Procedure: COMPUTER ASSISTED TOTAL KNEE ARTHROPLASTY - RNFA;  Surgeon: Donato Heinz, MD;  Location: [facility];  Service: Orthopedics;  Laterality: Left;   KNEE ARTHROSCOPY Bilateral    Right knee scope 1998, Left knee scope   TONSILLECTOMY     TOTAL KNEE ARTHROPLASTY Right 02/01/2015   Procedure: TOTAL KNEE ARTHROPLASTY;  Surgeon: Erin Sons, MD;  Location: [facility];  Service: Orthopedics;  Laterality: Right;   TUBAL  LIGATION      Family Psychiatric History: Reviewed family psychiatric history from progress note on 09/16/2018  Family History:  Family History  Problem Relation Age of Onset   Breast cancer Mother 53   Colon cancer Father    Heart attack Father    Bipolar disorder Sister    Depression Sister    Schizophrenia Sister     Social History: Reviewed social history from progress note on 09/16/2018 Social History   Socioeconomic History   Marital status: Legally Separated    Spouse name: Not on file   Number of children: 1   Years of education: Not on file   Highest education level: High school graduate  Occupational History   Not on file  Tobacco Use   Smoking status: Every Day    Packs/day: 1.00    Years: 30.00    Pack years: 30.00    Types: Cigarettes    Last attempt to quit: 06/29/2020    Years since quitting: 0.6   Smokeless tobacco: Never   Tobacco comments:    States she restarted several months back and no interest in quitting at this time.   Vaping Use   Vaping Use: Never used  Substance and Sexual Activity   Alcohol use: No   Drug use: Yes    Types: Marijuana    Comment: every day   Sexual activity: Not Currently  Other Topics Concern   Not on file  Social History Narrative   Not on file   Social Determinants of Health   Financial Resource Strain: Not on file  Food Insecurity: Not on file  Transportation Needs: Not on file  Physical Activity: Not on file  Stress: Not on file  Social Connections: Not on file    Allergies: No Known Allergies  Metabolic Disorder Labs: Lab Results  Component Value Date   HGBA1C 6.2 (H) 09/26/2020   MPG 131.24 09/26/2020   Lab Results  Component Value Date   PROLACTIN 6.4 09/26/2020   Lab Results  Component Value Date   CHOL 164 09/26/2020   TRIG 99 09/26/2020   HDL 44 09/26/2020   CHOLHDL 3.7 09/26/2020   VLDL 20 09/26/2020   LDLCALC 100 (H) 09/26/2020   Lab Results  Component Value Date   TSH 0.696  09/26/2020    Therapeutic Level Labs: No results found for: LITHIUM No results found for: VALPROATE No components found for:  CBMZ  Current Medications: Current Outpatient Medications  Medication Sig Dispense Refill   ADVAIR DISKUS 250-50 MCG/DOSE AEPB Inhale 1 puff into the lungs 2 (two) times daily.     albuterol (VENTOLIN HFA) 108 (90 Base) MCG/ACT inhaler Inhale 2 puffs into the lungs 4 (four) times daily.      HYDROcodone-acetaminophen (NORCO/VICODIN) 5-325 MG tablet Take 1 tablet  by mouth every 6 (six) hours as needed.     lidocaine (LIDODERM) 5 %      meloxicam (MOBIC) 7.5 MG tablet Take 7.5 mg by mouth 2 (two) times daily.     simvastatin (ZOCOR) 20 MG tablet Take 20 mg by mouth at bedtime.      ARIPiprazole (ABILIFY) 20 MG tablet Take 1 tablet (20 mg total) by mouth daily. 90 tablet 0   busPIRone (BUSPAR) 30 MG tablet Take 1 tablet (30 mg total) by mouth 2 (two) times daily. 180 tablet 0   gabapentin (NEURONTIN) 800 MG tablet Take 1 tablet (800 mg total) by mouth every 6 (six) hours. 120 tablet 5   methocarbamol (ROBAXIN) 750 MG tablet Take 1 tablet (750 mg total) by mouth 3 (three) times daily. Max: 3/day 90 tablet 5   oxcarbazepine (TRILEPTAL) 600 MG tablet TAKE ONE TABLET TWICE DAILY FOR MOOD 180 tablet 0   traZODone (DESYREL) 100 MG tablet Take 2 tablets (200 mg total) by mouth at bedtime as needed for sleep. 180 tablet 0   venlafaxine XR (EFFEXOR XR) 150 MG 24 hr capsule Take 1 capsule (150 mg total) by mouth daily with breakfast. To be combined with 37.5 mg daily 90 capsule 0   venlafaxine XR (EFFEXOR-XR) 37.5 MG 24 hr capsule Take 1 capsule (37.5 mg total) by mouth daily with breakfast. Start taking along with 150 mg 90 capsule 0   No current facility-administered medications for this visit.     Musculoskeletal: Strength & Muscle Tone: within normal limits Gait & Station:  walks with cane Patient leans: Right  Psychiatric Specialty Exam: Review of Systems   Musculoskeletal:  Positive for back pain.       BL shoulder pain  Psychiatric/Behavioral:  Negative for agitation, behavioral problems, confusion, decreased concentration, dysphoric mood, hallucinations, self-injury, sleep disturbance and suicidal ideas. The patient is not nervous/anxious and is not hyperactive.   All other systems reviewed and are negative.  Blood pressure 126/75, pulse 76, temperature 98.5 F (36.9 C), temperature source Temporal, weight 239 lb (108.4 kg).Body mass index is 39.77 kg/m.  General Appearance: Casual  Eye Contact:  Good  Speech:  Normal Rate  Volume:  Normal  Mood:  Euthymic  Affect:  Congruent  Thought Process:  Goal Directed and Descriptions of Associations: Intact  Orientation:  Full (Time, Place, and Person)  Thought Content: Logical   Suicidal Thoughts:  No  Homicidal Thoughts:  No  Memory:  Immediate;   Fair Recent;   Fair Remote;   Fair  Judgement:  Fair  Insight:  Fair  Psychomotor Activity:  Normal  Concentration:  Concentration: Fair and Attention Span: Fair  Recall:  [employer] of Knowledge: Fair  Language: Fair  Akathisia:  No  Handed:  Right  AIMS (if indicated): done  Assets:  Communication Skills Desire for Improvement Social Support Vocational/Educational  ADL's:  Intact  Cognition: WNL  Sleep:  Fair   Screenings: AIMS    Flowsheet Row Office Visit from 11/27/2020 in [facility] Office Visit from 09/26/2020 in [facility]  AIMS Total Score 0 0      GAD-7    Flowsheet Row Office Visit from 11/27/2020 in [facility] Office Visit from 09/26/2020 in [facility]  Total GAD-7 Score 4 5      PHQ2-9    Flowsheet Row Office Visit from 02/13/2021 in [facility] Office Visit from 11[facility]27/2020 in [facility]  [facility] Office Visit from 09/26/2020 in [facility]  [facility] Associates Office Visit from 11/30/2019 in [facility] Clinical Support from 08/26/2017 in [facility]  PHQ-2 Total Score 2 2 2  0 0  PHQ-9 Total Score 3 4 3  -- --      Flowsheet Row Office Visit from 11/27/2020 in [facility] Office Visit from 09/26/2020 in [facility]  C-SSRS RISK CATEGORY No Risk No Risk        Assessment and Plan: Shelly Wright is a 50 year old Caucasian female on SSI, has a history of bipolar disorder, tobacco use disorder, COPD was evaluated in office today.  Patient is currently stable with regards to her mood however does report cannabis abuse and tobacco use disorder, not willing to quit.  Discussed plan as noted below.  Plan Bipolar disorder in remission Abilify 20 mg p.o. daily Trazodone 200 mg p.o. nightly as needed Trileptal 600 mg p.o. twice daily Venlafaxine extended release 187.5 mg p.o. daily AIMS - 0  GAD-stable Venlafaxine as prescribed BuSpar 30 mg p.o. twice daily Propranolol 10 mg p.o. 3 times daily as needed Trileptal 600 mg p.o. twice daily  Cannabis use disorder moderate-unstable Provided counseling.  Tobacco use disorder-unstable Provided counseling for 2 minutes  High risk medication use-will order platelet count, hepatic function panel, sodium  At risk for prolonged QT syndrome-we will order EKG.  Provided phone Valentino Saxon schedule the appointment.  Discussed with patient the risk of being on psychotropic medications as well as using cannabis.  With elevated blood pressure reading initially in session, on repeat her blood pressure came back to be within normal limits.  Follow-up in clinic in 2 months or sooner if needed.  This note was generated in part or whole with voice recognition software. Voice recognition is usually quite accurate but there are transcription errors that  can and very often do occur. I apologize for any typographical errors that were not detected and corrected.        44, MD 02/13/2021, 5:18 PM

## 2021-02-13 NOTE — Telephone Encounter (Signed)
received a fax that a prior auth was needed on the aripiprazole 20mg 

## 2021-02-13 NOTE — Patient Instructions (Signed)
336- [id number] - call for EKG

## 2021-02-13 NOTE — Telephone Encounter (Signed)
went online to [email or web address] and submtted the prior auth for the aripiprazole 20mg  approved until 02-13-22 PA Case # [id number]

## 2021-02-15 ENCOUNTER — Other Ambulatory Visit: Payer: Self-pay | Admitting: Psychiatry

## 2021-02-15 DIAGNOSIS — F3178 Bipolar disorder, in full remission, most recent episode mixed: Secondary | ICD-10-CM

## 2021-02-22 ENCOUNTER — Other Ambulatory Visit: Payer: Self-pay | Admitting: Orthopedic Surgery

## 2021-02-22 ENCOUNTER — Other Ambulatory Visit (HOSPITAL_COMMUNITY): Payer: Self-pay | Admitting: Orthopedic Surgery

## 2021-02-22 DIAGNOSIS — M75121 Complete rotator cuff tear or rupture of right shoulder, not specified as traumatic: Secondary | ICD-10-CM

## 2021-02-23 ENCOUNTER — Other Ambulatory Visit: Payer: Self-pay

## 2021-02-23 ENCOUNTER — Ambulatory Visit
Admission: RE | Admit: 2021-02-23 | Discharge: 2021-02-23 | Disposition: A | Discharge status: Home / Self Care | Payer: Medicaid Other | Source: Ambulatory Visit | Attending: Orthopedic Surgery | Admitting: Orthopedic Surgery

## 2021-02-23 DIAGNOSIS — M75121 Complete rotator cuff tear or rupture of right shoulder, not specified as traumatic: Secondary | ICD-10-CM | POA: Insufficient documentation

## 2021-03-05 ENCOUNTER — Other Ambulatory Visit: Payer: Self-pay

## 2021-03-05 ENCOUNTER — Encounter
Admission: RE | Admit: 2021-03-05 | Discharge: 2021-03-05 | Disposition: A | Discharge status: Home / Self Care | Payer: Medicaid Other | Source: Ambulatory Visit | Attending: Orthopedic Surgery | Admitting: Orthopedic Surgery

## 2021-03-05 DIAGNOSIS — Z01818 Encounter for other preprocedural examination: Secondary | ICD-10-CM | POA: Insufficient documentation

## 2021-03-05 LAB — URINALYSIS, ROUTINE W REFLEX MICROSCOPIC
Bilirubin Urine: NEGATIVE
Glucose, UA: NEGATIVE mg/dL
Hgb urine dipstick: NEGATIVE
Ketones, ur: NEGATIVE mg/dL
Leukocytes,Ua: NEGATIVE
Nitrite: NEGATIVE
Protein, ur: NEGATIVE mg/dL
Specific Gravity, Urine: 1.021 (ref 1.005–1.030)
pH: 6 (ref 5.0–8.0)

## 2021-03-05 LAB — COMPREHENSIVE METABOLIC PANEL
ALT: 22 U/L (ref 0–44)
AST: 20 U/L (ref 15–41)
Albumin: 3.5 g/dL (ref 3.5–5.0)
Alkaline Phosphatase: 79 U/L (ref 38–126)
Anion gap: 9 (ref 5–15)
BUN: 14 mg/dL (ref 6–20)
CO2: 27 mmol/L (ref 22–32)
Calcium: 9 mg/dL (ref 8.9–10.3)
Chloride: 95 mmol/L — ABNORMAL LOW (ref 98–111)
Creatinine, Ser: 0.84 mg/dL (ref 0.44–1.00)
GFR, Estimated: >60 mL/min (ref >60)
Glucose, Bld: 111 mg/dL — ABNORMAL HIGH (ref 70–99)
Potassium: 4.2 mmol/L (ref 3.5–5.1)
Sodium: 131 mmol/L — ABNORMAL LOW (ref 135–145)
Total Bilirubin: 0.4 mg/dL (ref 0.3–1.2)
Total Protein: 6.4 g/dL — ABNORMAL LOW (ref 6.5–8.1)

## 2021-03-05 LAB — CBC WITH DIFFERENTIAL/PLATELET
Abs Immature Granulocytes: 0.02 10*3/uL (ref 0.00–0.07)
Basophils Absolute: 0 10*3/uL (ref 0.0–0.1)
Basophils Relative: 1 %
Eosinophils Absolute: 0.1 10*3/uL (ref 0.0–0.5)
Eosinophils Relative: 2 %
HCT: 42.6 % (ref 36.0–46.0)
Hemoglobin: 14.5 g/dL (ref 12.0–15.0)
Immature Granulocytes: 0 %
Lymphocytes Relative: 31 %
Lymphs Abs: 2.6 10*3/uL (ref 0.7–4.0)
MCH: 30.4 pg (ref 26.0–34.0)
MCHC: 34 g/dL (ref 30.0–36.0)
MCV: 89.3 fL (ref 80.0–100.0)
Monocytes Absolute: 0.5 10*3/uL (ref 0.1–1.0)
Monocytes Relative: 6 %
Neutro Abs: 4.9 10*3/uL (ref 1.7–7.7)
Neutrophils Relative %: 60 %
Platelets: 280 10*3/uL (ref 150–400)
RBC: 4.77 MIL/uL (ref 3.87–5.11)
RDW: 13.2 % (ref 11.5–15.5)
WBC: 8.2 10*3/uL (ref 4.0–10.5)
nRBC: 0 % (ref 0.0–0.2)

## 2021-03-05 LAB — TYPE AND SCREEN
ABO/RH(D): AB POS
Antibody Screen: NEGATIVE

## 2021-03-05 LAB — SURGICAL PCR SCREEN
MRSA, PCR: POSITIVE — AB
Staphylococcus aureus: POSITIVE — AB

## 2021-03-05 NOTE — Progress Notes (Signed)
  Perioperative Services  Abnormal Lab Notification  Date: 03/05/21  Name: Shelly Wright MRN:   [id number]  Re: Abnormal labs noted during PAT appointment  Provider Notified: Signa Kell, MD Notification mode: Routed and/or faxed via [facility]  Labs of concern: Lab Results  Component Value Date   STAPHAUREUS POSITIVE (A) 03/05/2021   MRSAPCR POSITIVE (A) 03/05/2021    Notes: Patient is scheduled for a Right reverse shoulder arthroplasty, biceps tenodesis (Right: Shoulder) on 03/12/2021. She is scheduled to receive CEFAZOLIN pre-operatively. Surgical PCR (+) for MRSA; see above.  PLANS:  Review renal function. Estimated Creatinine Clearance: 96.3 mL/min (by C-G formula based on SCr of 0.84 mg/dL). Review allergies. No documented allergy to vancomycin. CEFAZOLIN discontinued. Order added for VANCOMYCIN 1 GRAM IV to preoperative prophylactic regimen.  Notify primary attending surgeon of (+) MRSA result and additional order placed for antibiotic by perioperative APP.   This is a Personal assistant; no formal response is required.  [name], MSN, APRN, FNP-C, CEN [facility]  Peri-operative Services Nurse Practitioner Phone: [phone] 03/05/21 12:23 PM

## 2021-03-05 NOTE — Patient Instructions (Addendum)
Your procedure is scheduled on: 03/12/21 - Monday Report to the Registration Desk on the 1st floor of the Medical [facility]. To find out your arrival time, please call [phone] between 1PM - 3PM on: 03/09/21 - Friday  REMEMBER: Instructions that are not followed completely Mccaffrey result in serious medical risk, up to and including death; or upon the discretion of your surgeon and anesthesiologist your surgery Kupper need to be rescheduled.  Do not eat food after midnight the night before surgery.  No gum chewing, lozengers or hard candies.  You Grable however, drink CLEAR liquids up to 2 hours before you are scheduled to arrive for your surgery. Do not drink anything within 2 hours of your scheduled arrival time.  Clear liquids include: - water  - apple juice without pulp - gatorade (not RED, PURPLE, OR BLUE) - black coffee or tea (Do NOT add milk or creamers to the coffee or tea) Do NOT drink anything that is not on this list.  Type 1 and Type 2 diabetics should only drink water.  In addition, your doctor has ordered for you to drink the provided  Ensure Pre-Surgery Clear Carbohydrate Drink  Drinking this carbohydrate drink up to two hours before surgery helps to reduce insulin resistance and improve patient outcomes. Please complete drinking 2 hours prior to scheduled arrival time.  TAKE THESE MEDICATIONS THE MORNING OF SURGERY WITH A SIP OF WATER:  - ADVAIR DISKUS 250-50 MCG/DOSE AEPB - ARIPiprazole (ABILIFY) 20 MG tablet - busPIRone (BUSPAR) 30 MG tablet - gabapentin (NEURONTIN) 800 MG tablet - HYDROcodone-acetaminophen (NORCO) 7.5-325 MG tablet - methocarbamol (ROBAXIN) 750 MG tablet - oxcarbazepine (TRILEPTAL) 600 MG tablet - venlafaxine XR (EFFEXOR XR) 150 MG 24 hr capsule - venlafaxine XR (EFFEXOR-XR) 37.5 MG 24 hr capsule - Vibegron (GEMTESA) 75 MG TABS  Use albuterol (VENTOLIN HFA) 108 (90 Base) MCG/ACT inhaler on the day of surgery and bring to the hospital.  One week prior  to surgery: Stop Anti-inflammatories (NSAIDS) such as Advil, Aleve, Ibuprofen, Motrin, Naproxen, Naprosyn and Aspirin based products such as Excedrin, Goodys Powder, BC Powder.  Stop ANY OVER THE COUNTER supplements until after surgery.  You Albert however, continue to take Tylenol if needed for pain up until the day of surgery.  No Alcohol for 24 hours before or after surgery.  No Smoking including e-cigarettes for 24 hours prior to surgery.  No chewable tobacco products for at least 6 hours prior to surgery.  No nicotine patches on the day of surgery.  Do not use any "recreational" drugs for at least a week prior to your surgery.  Please be advised that the combination of cocaine and anesthesia Stanislaw have negative outcomes, up to and including death. If you test positive for cocaine, your surgery will be cancelled.  On the morning of surgery brush your teeth with toothpaste and water, you Bethea rinse your mouth with mouthwash if you wish. Do not swallow any toothpaste or mouthwash.  Use CHG Soap or wipes as directed on instruction sheet.  Do not wear jewelry, make-up, hairpins, clips or nail polish.  Do not wear lotions, powders, or perfumes.   Do not shave body from the neck down 48 hours prior to surgery just in case you cut yourself which could leave a site for infection.  Also, freshly shaved skin Miu become irritated if using the CHG soap.  Contact lenses, hearing aids and dentures Weingarten not be worn into surgery.  Do not bring valuables to the hospital.  [facility] is not responsible for any missing/lost belongings or valuables.   Notify your doctor if there is any change in your medical condition (cold, fever, infection).  Wear comfortable clothing (specific to your surgery type) to the hospital.  After surgery, you can help prevent lung complications by doing breathing exercises.  Take deep breaths and cough every 1-2 hours. Your doctor Beaubien order a device called an Incentive  Spirometer to help you take deep breaths. When coughing or sneezing, hold a pillow firmly against your incision with both hands. This is called "splinting." Doing this helps protect your incision. It also decreases belly discomfort.  If you are being admitted to the hospital overnight, leave your suitcase in the car. After surgery it Wubben be brought to your room.  If you are being discharged the day of surgery, you will not be allowed to drive home. You will need a responsible adult (18 years or older) to drive you home and stay with you that night.   If you are taking public transportation, you will need to have a responsible adult (18 years or older) with you. Please confirm with your physician that it is acceptable to use public transportation.   Please call the Pre-admissions Testing Dept. at [phone] if you have any questions about these instructions.  Surgery Visitation Policy:  Patients undergoing a surgery or procedure Halteman have one family member or support person with them as long as that person is not COVID-19 positive or experiencing its symptoms.  That person Heidecker remain in the waiting area during the procedure and Cisnero rotate out with other people.  Inpatient Visitation:    Visiting hours are 7 a.m. to 8 p.m. Up to two visitors ages 16+ are allowed at one time in a patient room. The visitors Vanmeter rotate out with other people during the day. Visitors must check out when they leave, or other visitors will not be allowed. One designated support person Squillace remain overnight. The visitor must pass COVID-19 screenings, use hand sanitizer when entering and exiting the patient's room and wear a mask at all times, including in the patient's room. Patients must also wear a mask when staff or their visitor are in the room. Masking is required regardless of vaccination status.

## 2021-03-08 ENCOUNTER — Other Ambulatory Visit: Admission: RE | Admit: 2021-03-08 | Payer: Medicaid Other | Source: Ambulatory Visit

## 2021-03-10 NOTE — Anesthesia Preprocedure Evaluation (Addendum)
Anesthesia Evaluation  Patient identified by MRN, date of birth, ID band Patient awake    Reviewed: Allergy & Precautions, H&P , NPO status , Patient's Chart, lab work & pertinent test results  History of Anesthesia Complications History of anesthetic complications: "I need a lot of anesthesia"  Airway Mallampati: I  TM Distance: >3 FB Neck ROM: limited    Dental  (+) Edentulous Upper   Pulmonary shortness of breath and with exertion, asthma , COPD,  COPD inhaler, Current Smoker and Patient abstained from smoking.,     + wheezing      Cardiovascular Exercise Tolerance: Poor METS: 3 - Mets + dysrhythmias (PVC's )  Rhythm:Regular Rate:Normal - Systolic murmurs and - Peripheral Edema ECHO 2020: 1. Left ventricular ejection fraction, by visual estimation, is 55 to  60%. The left ventricle has normal function. Left ventricular septal wall  thickness was mildly increased. Mildly increased left ventricular  posterior wall thickness. There is mildly  increased left ventricular hypertrophy.  2. Global right ventricle has normal systolic function.The right  ventricular size is normal. No increase in right ventricular wall  thickness.  3. Left atrial size was normal.  4. Right atrial size was normal.  5. The mitral valve was not well visualized. Trace mitral valve  regurgitation.  6. The tricuspid valve is grossly normal. Tricuspid valve regurgitation  is trivial.  7. The aortic valve is grossly normal. Aortic valve regurgitation is not  visualized.  8. The pulmonic valve was not well visualized. Pulmonic valve  regurgitation is trivial.  9. Mildly elevated pulmonary artery systolic pressure.  10. The atrial septum is grossly normal.   Neuro/Psych PSYCHIATRIC DISORDERS Anxiety Depression Bipolar Disorder Chronic opioid useSciatica  Cervical post-laminectomy syndrome (Anterior-posterior approach) s/p cervical fusion x2   Neuromuscular disease    GI/Hepatic negative GI ROS, (+)     substance abuse  marijuana use,   Endo/Other  Morbid obesity  Renal/GU      Musculoskeletal  (+) Arthritis , Fibromyalgia -Complete tear of right rotator cuff  DDD (degenerative disc disease), lumbar   Abdominal (+) + obese,   Peds  Hematology negative hematology ROS (+)   Anesthesia Other Findings Long term current use of opiate analgesic Long term prescription opiate use    Past Medical History: 08/30/2014: Acute right-sided low back pain with right-sided sciatica No date: Anxiety No date: Arthritis No date: Asthma     Comment:  in past, no current inhalers No date: Bipolar disorder (HCC) 05/07/2012: Cervical spinal cord compression (HCC) No date: COPD (chronic obstructive pulmonary disease) (HCC) No date: DDD (degenerative disc disease), lumbar No date: Depression No date: Dyspnea     Comment:  with exertion No date: Dysrhythmia     Comment:  irregular-h/o in the past No date: Fibromyalgia No date: Hypertension 05/23/2014: Neuritis or radiculitis due to rupture of lumbar  intervertebral disc 07/31/2015: Periprosthetic fracture around prosthetic knee No date: Restless leg syndrome 01/24/2012: Soft tissue lesion of shoulder region 09/18/2015: Status post revision of total replacement of right knee 02/01/2015: Status post right knee replacement 04/08/2014: Syncope and collapse 05/07/2012: Thoracic and lumbosacral neuritis 05/07/2012: Thoracic neuritis  Past Surgical History: No date: ABDOMINAL HYSTERECTOMY No date: Cervical Fusion X 2 01/2016: JOINT REPLACEMENT; Right     Comment:  Dr Gavin Potters No date: KNEE ARTHROSCOPY; Bilateral     Comment:  Right knee scope 1998, Left knee scope No date: TONSILLECTOMY 02/01/2015: TOTAL KNEE ARTHROPLASTY; Right     Comment:  Procedure: TOTAL KNEE  ARTHROPLASTY;  Surgeon: Erin Sons, MD;  Location: [facility] ORS;  Service: Orthopedics;               Laterality: Right; No date: TUBAL LIGATION     Reproductive/Obstetrics negative OB ROS                           Anesthesia Physical  Anesthesia Plan  ASA: III  Anesthesia Plan: General   Post-op Pain Management: GA combined w/ Regional for post-op pain   Induction:   PONV Risk Score and Plan: 2 and Ondansetron and Dexamethasone  Airway Management Planned: Oral ETT  Additional Equipment:   Intra-op Plan:   Post-operative Plan: Extubation in OR  Informed Consent: I have reviewed the patients History and Physical, chart, labs and discussed the procedure including the risks, benefits and alternatives for the proposed anesthesia with the patient or authorized representative who has indicated his/her understanding and acceptance.     Dental advisory given  Plan Discussed with: CRNA, Anesthesiologist and Surgeon  Anesthesia Plan Comments:        Anesthesia Quick Evaluation

## 2021-03-12 ENCOUNTER — Encounter
Admission: RE | Disposition: A | Discharge status: Home / Self Care | Payer: Self-pay | Source: Home / Self Care | Attending: Orthopedic Surgery

## 2021-03-12 ENCOUNTER — Ambulatory Visit: Payer: Medicaid Other

## 2021-03-12 ENCOUNTER — Encounter: Payer: Self-pay | Admitting: Orthopedic Surgery

## 2021-03-12 ENCOUNTER — Ambulatory Visit: Payer: Medicaid Other | Admitting: Urgent Care

## 2021-03-12 ENCOUNTER — Other Ambulatory Visit: Payer: Self-pay

## 2021-03-12 ENCOUNTER — Ambulatory Visit
Admission: RE | Admit: 2021-03-12 | Discharge: 2021-03-12 | Disposition: A | Discharge status: Home / Self Care | Payer: Medicaid Other | Attending: Orthopedic Surgery | Admitting: Orthopedic Surgery

## 2021-03-12 DIAGNOSIS — J449 Chronic obstructive pulmonary disease, unspecified: Secondary | ICD-10-CM | POA: Insufficient documentation

## 2021-03-12 DIAGNOSIS — F1290 Cannabis use, unspecified, uncomplicated: Secondary | ICD-10-CM | POA: Insufficient documentation

## 2021-03-12 DIAGNOSIS — M797 Fibromyalgia: Secondary | ICD-10-CM | POA: Diagnosis not present

## 2021-03-12 DIAGNOSIS — Z419 Encounter for procedure for purposes other than remedying health state, unspecified: Secondary | ICD-10-CM

## 2021-03-12 DIAGNOSIS — F17210 Nicotine dependence, cigarettes, uncomplicated: Secondary | ICD-10-CM | POA: Diagnosis not present

## 2021-03-12 DIAGNOSIS — Z96619 Presence of unspecified artificial shoulder joint: Secondary | ICD-10-CM

## 2021-03-12 DIAGNOSIS — M75121 Complete rotator cuff tear or rupture of right shoulder, not specified as traumatic: Secondary | ICD-10-CM | POA: Diagnosis not present

## 2021-03-12 DIAGNOSIS — G8929 Other chronic pain: Secondary | ICD-10-CM | POA: Insufficient documentation

## 2021-03-12 DIAGNOSIS — I10 Essential (primary) hypertension: Secondary | ICD-10-CM | POA: Insufficient documentation

## 2021-03-12 DIAGNOSIS — F1190 Opioid use, unspecified, uncomplicated: Secondary | ICD-10-CM | POA: Insufficient documentation

## 2021-03-12 HISTORY — PX: REVERSE SHOULDER ARTHROPLASTY: SHX5054

## 2021-03-12 SURGERY — ARTHROPLASTY, SHOULDER, TOTAL, REVERSE
Anesthesia: General | Site: Shoulder | Laterality: Right

## 2021-03-12 MED ORDER — FENTANYL CITRATE (PF) 100 MCG/2ML IJ SOLN
INTRAMUSCULAR | Status: AC
  Filled 2021-03-12: qty 2

## 2021-03-12 MED ORDER — BUPIVACAINE LIPOSOME 1.3 % IJ SUSP
INTRAMUSCULAR | Status: AC
  Filled 2021-03-12: qty 20

## 2021-03-12 MED ORDER — FENTANYL CITRATE PF 50 MCG/ML IJ SOSY
PREFILLED_SYRINGE | INTRAMUSCULAR | Status: AC
  Administered 2021-03-12: 50 ug via INTRAVENOUS
  Filled 2021-03-12: qty 1

## 2021-03-12 MED ORDER — SODIUM CHLORIDE 0.9 % IR SOLN
Status: DC | PRN
  Administered 2021-03-12: 3000 mL
  Administered 2021-03-12: 1000 mL

## 2021-03-12 MED ORDER — CHLORHEXIDINE GLUCONATE 0.12 % MT SOLN: 15.0000 mL | Freq: Once | OROMUCOSAL | Status: AC

## 2021-03-12 MED ORDER — VASOPRESSIN 20 UNIT/ML IV SOLN
INTRAVENOUS | Status: DC | PRN
  Administered 2021-03-12: 2 [IU] via INTRAVENOUS
  Administered 2021-03-12: 1 [IU] via INTRAVENOUS
  Administered 2021-03-12: 2 [IU] via INTRAVENOUS

## 2021-03-12 MED ORDER — TRANEXAMIC ACID-NACL 1000-0.7 MG/100ML-% IV SOLN
1000.0000 mg | INTRAVENOUS | Status: AC
  Administered 2021-03-12: 1000 mg via INTRAVENOUS

## 2021-03-12 MED ORDER — VANCOMYCIN HCL 1000 MG IV SOLR
INTRAVENOUS | Status: AC
  Filled 2021-03-12: qty 20

## 2021-03-12 MED ORDER — FENTANYL CITRATE (PF) 100 MCG/2ML IJ SOLN
INTRAMUSCULAR | Status: DC | PRN
  Administered 2021-03-12: 100 ug via INTRAVENOUS

## 2021-03-12 MED ORDER — FENTANYL CITRATE PF 50 MCG/ML IJ SOSY: 50.0000 ug | PREFILLED_SYRINGE | Freq: Once | INTRAMUSCULAR | Status: AC

## 2021-03-12 MED ORDER — TRANEXAMIC ACID-NACL 1000-0.7 MG/100ML-% IV SOLN
INTRAVENOUS | Status: AC
  Filled 2021-03-12: qty 100

## 2021-03-12 MED ORDER — BUPIVACAINE LIPOSOME 1.3 % IJ SUSP
INTRAMUSCULAR | Status: DC | PRN
  Administered 2021-03-12: 10 mL via PERINEURAL

## 2021-03-12 MED ORDER — CEFAZOLIN SODIUM-DEXTROSE 2-4 GM/100ML-% IV SOLN
INTRAVENOUS | Status: AC
  Administered 2021-03-12: 2 g via INTRAVENOUS
  Filled 2021-03-12: qty 100

## 2021-03-12 MED ORDER — OXYCODONE HCL 5 MG/5ML PO SOLN
5.0000 mg | Freq: Once | ORAL | Status: AC | PRN
Start: 2021-03-12 — End: 2021-03-12

## 2021-03-12 MED ORDER — OXYCODONE HCL 5 MG PO TABS: 5.0000 mg | ORAL_TABLET | ORAL | 0 refills | Status: DC | PRN

## 2021-03-12 MED ORDER — LIDOCAINE HCL (CARDIAC) PF 100 MG/5ML IV SOSY
PREFILLED_SYRINGE | INTRAVENOUS | Status: DC | PRN
  Administered 2021-03-12: 100 mg via INTRAVENOUS

## 2021-03-12 MED ORDER — PHENYLEPHRINE HCL-NACL 20-0.9 MG/250ML-% IV SOLN
INTRAVENOUS | Status: DC | PRN
  Administered 2021-03-12: 25 ug/min via INTRAVENOUS

## 2021-03-12 MED ORDER — FAMOTIDINE 20 MG PO TABS
ORAL_TABLET | ORAL | Status: AC
  Administered 2021-03-12: 20 mg via ORAL
  Filled 2021-03-12: qty 1

## 2021-03-12 MED ORDER — PHENYLEPHRINE HCL (PRESSORS) 10 MG/ML IV SOLN
INTRAVENOUS | Status: DC | PRN
  Administered 2021-03-12: 150 ug via INTRAVENOUS
  Administered 2021-03-12 (×4): 100 ug via INTRAVENOUS
  Administered 2021-03-12 (×2): 50 ug via INTRAVENOUS

## 2021-03-12 MED ORDER — CEFAZOLIN SODIUM-DEXTROSE 2-4 GM/100ML-% IV SOLN
INTRAVENOUS | Status: AC
  Filled 2021-03-12: qty 100

## 2021-03-12 MED ORDER — FAMOTIDINE 20 MG PO TABS: 20.0000 mg | ORAL_TABLET | Freq: Once | ORAL | Status: AC

## 2021-03-12 MED ORDER — SUGAMMADEX SODIUM 500 MG/5ML IV SOLN
INTRAVENOUS | Status: DC | PRN
  Administered 2021-03-12: 200 mg via INTRAVENOUS

## 2021-03-12 MED ORDER — VANCOMYCIN HCL IN DEXTROSE 1-5 GM/200ML-% IV SOLN
1000.0000 mg | INTRAVENOUS | Status: AC
  Administered 2021-03-12: 1000 mg via INTRAVENOUS

## 2021-03-12 MED ORDER — CHLORHEXIDINE GLUCONATE 0.12 % MT SOLN
OROMUCOSAL | Status: AC
  Administered 2021-03-12: 15 mL via OROMUCOSAL
  Filled 2021-03-12: qty 15

## 2021-03-12 MED ORDER — PHENYLEPHRINE HCL (PRESSORS) 10 MG/ML IV SOLN
INTRAVENOUS | Status: AC
  Filled 2021-03-12: qty 1

## 2021-03-12 MED ORDER — ACETAMINOPHEN 500 MG PO TABS: 1000.0000 mg | ORAL_TABLET | Freq: Three times a day (TID) | ORAL | 2 refills | Status: DC

## 2021-03-12 MED ORDER — BUPIVACAINE LIPOSOME 1.3 % IJ SUSP
INTRAMUSCULAR | Status: AC
  Filled 2021-03-12: qty 10

## 2021-03-12 MED ORDER — LIDOCAINE HCL (PF) 2 % IJ SOLN
INTRAMUSCULAR | Status: DC | PRN
  Administered 2021-03-12: 20 mg via INTRADERMAL

## 2021-03-12 MED ORDER — LIDOCAINE HCL (PF) 1 % IJ SOLN
INTRAMUSCULAR | Status: AC
  Filled 2021-03-12: qty 5

## 2021-03-12 MED ORDER — ACETAMINOPHEN 10 MG/ML IV SOLN: 1000.0000 mg | Freq: Once | INTRAVENOUS | Status: DC | PRN

## 2021-03-12 MED ORDER — ORAL CARE MOUTH RINSE: 15.0000 mL | Freq: Once | OROMUCOSAL | Status: AC

## 2021-03-12 MED ORDER — OXYCODONE HCL 5 MG PO TABS
5.0000 mg | ORAL_TABLET | Freq: Once | ORAL | Status: AC | PRN
Start: 2021-03-12 — End: 2021-03-12
  Administered 2021-03-12: 5 mg via ORAL

## 2021-03-12 MED ORDER — PROMETHAZINE HCL 25 MG/ML IJ SOLN: 6.2500 mg | INTRAMUSCULAR | Status: DC | PRN

## 2021-03-12 MED ORDER — EPHEDRINE SULFATE 50 MG/ML IJ SOLN
INTRAMUSCULAR | Status: DC | PRN
  Administered 2021-03-12: 10 mg via INTRAVENOUS
  Administered 2021-03-12: 5 mg via INTRAVENOUS
  Administered 2021-03-12: 10 mg via INTRAVENOUS
  Administered 2021-03-12 (×4): 5 mg via INTRAVENOUS

## 2021-03-12 MED ORDER — ACETAMINOPHEN 10 MG/ML IV SOLN
INTRAVENOUS | Status: DC | PRN
  Administered 2021-03-12: 1000 mg via INTRAVENOUS

## 2021-03-12 MED ORDER — LIDOCAINE HCL (PF) 2 % IJ SOLN
INTRAMUSCULAR | Status: AC
  Filled 2021-03-12: qty 5

## 2021-03-12 MED ORDER — GLYCOPYRROLATE 0.2 MG/ML IJ SOLN
INTRAMUSCULAR | Status: DC | PRN
  Administered 2021-03-12: .2 mg via INTRAVENOUS

## 2021-03-12 MED ORDER — DEXAMETHASONE SODIUM PHOSPHATE 10 MG/ML IJ SOLN
INTRAMUSCULAR | Status: AC
  Filled 2021-03-12: qty 1

## 2021-03-12 MED ORDER — OXYCODONE HCL 5 MG PO TABS
ORAL_TABLET | ORAL | Status: AC
  Filled 2021-03-12: qty 1

## 2021-03-12 MED ORDER — PROPOFOL 10 MG/ML IV BOLUS
INTRAVENOUS | Status: AC
  Filled 2021-03-12: qty 40

## 2021-03-12 MED ORDER — ONDANSETRON 4 MG PO TBDP: 4.0000 mg | ORAL_TABLET | Freq: Three times a day (TID) | ORAL | 0 refills | Status: DC | PRN

## 2021-03-12 MED ORDER — NEOMYCIN-POLYMYXIN B GU 40-200000 IR SOLN
Status: AC
  Filled 2021-03-12: qty 20

## 2021-03-12 MED ORDER — DEXMEDETOMIDINE HCL IN NACL 200 MCG/50ML IV SOLN
INTRAVENOUS | Status: AC
  Filled 2021-03-12: qty 50

## 2021-03-12 MED ORDER — VANCOMYCIN HCL 1000 MG IV SOLR
INTRAVENOUS | Status: DC | PRN
  Administered 2021-03-12: 1000 mg via TOPICAL

## 2021-03-12 MED ORDER — ROCURONIUM BROMIDE 10 MG/ML (PF) SYRINGE
PREFILLED_SYRINGE | INTRAVENOUS | Status: AC
  Filled 2021-03-12: qty 10

## 2021-03-12 MED ORDER — BUPIVACAINE-EPINEPHRINE (PF) 0.25% -1:200000 IJ SOLN
INTRAMUSCULAR | Status: AC
  Filled 2021-03-12: qty 30

## 2021-03-12 MED ORDER — MIDAZOLAM HCL 2 MG/2ML IJ SOLN: 1.0000 mg | INTRAMUSCULAR | Status: DC | PRN

## 2021-03-12 MED ORDER — DEXAMETHASONE SODIUM PHOSPHATE 10 MG/ML IJ SOLN
INTRAMUSCULAR | Status: DC | PRN
  Administered 2021-03-12: 10 mg via INTRAVENOUS

## 2021-03-12 MED ORDER — NEOMYCIN-POLYMYXIN B GU 40-200000 IR SOLN
Status: DC | PRN
  Administered 2021-03-12: 16 mL

## 2021-03-12 MED ORDER — STERILE WATER FOR IRRIGATION IR SOLN
Status: DC | PRN
  Administered 2021-03-12: 1000 mL

## 2021-03-12 MED ORDER — CEFAZOLIN SODIUM-DEXTROSE 2-4 GM/100ML-% IV SOLN: 2.0000 g | Freq: Once | INTRAVENOUS | Status: AC

## 2021-03-12 MED ORDER — BUPIVACAINE HCL (PF) 0.5 % IJ SOLN
INTRAMUSCULAR | Status: DC | PRN
  Administered 2021-03-12: 10 mL via PERINEURAL

## 2021-03-12 MED ORDER — ONDANSETRON HCL 4 MG/2ML IJ SOLN
INTRAMUSCULAR | Status: DC | PRN
  Administered 2021-03-12 (×2): 4 mg via INTRAVENOUS

## 2021-03-12 MED ORDER — TRANEXAMIC ACID-NACL 1000-0.7 MG/100ML-% IV SOLN: 1000.0000 mg | INTRAVENOUS | Status: AC

## 2021-03-12 MED ORDER — FENTANYL CITRATE (PF) 100 MCG/2ML IJ SOLN: 25.0000 ug | INTRAMUSCULAR | Status: DC | PRN

## 2021-03-12 MED ORDER — CEFAZOLIN SODIUM-DEXTROSE 2-4 GM/100ML-% IV SOLN
2.0000 g | Freq: Once | INTRAVENOUS | Status: AC
  Administered 2021-03-12: 2 g via INTRAVENOUS

## 2021-03-12 MED ORDER — BUPIVACAINE HCL (PF) 0.5 % IJ SOLN
INTRAMUSCULAR | Status: AC
  Filled 2021-03-12: qty 10

## 2021-03-12 MED ORDER — MIDAZOLAM HCL 2 MG/2ML IJ SOLN
INTRAMUSCULAR | Status: AC
  Administered 2021-03-12: 1 mg via INTRAVENOUS
  Filled 2021-03-12: qty 2

## 2021-03-12 MED ORDER — ASPIRIN EC 325 MG PO TBEC: 325.0000 mg | DELAYED_RELEASE_TABLET | Freq: Every day | ORAL | 0 refills | Status: AC

## 2021-03-12 MED ORDER — VANCOMYCIN HCL IN DEXTROSE 1-5 GM/200ML-% IV SOLN
INTRAVENOUS | Status: AC
  Filled 2021-03-12: qty 200

## 2021-03-12 MED ORDER — PROPOFOL 10 MG/ML IV BOLUS
INTRAVENOUS | Status: DC | PRN
  Administered 2021-03-12: 200 mg via INTRAVENOUS

## 2021-03-12 MED ORDER — LACTATED RINGERS IV SOLN
INTRAVENOUS | Status: DC
Start: 2021-03-12 — End: 2021-03-12

## 2021-03-12 MED ORDER — TRANEXAMIC ACID-NACL 1000-0.7 MG/100ML-% IV SOLN
INTRAVENOUS | Status: AC
  Administered 2021-03-12: 1000 mg via INTRAVENOUS
  Filled 2021-03-12: qty 100

## 2021-03-12 MED ORDER — ROCURONIUM BROMIDE 100 MG/10ML IV SOLN
INTRAVENOUS | Status: DC | PRN
  Administered 2021-03-12: 40 mg via INTRAVENOUS
  Administered 2021-03-12: 60 mg via INTRAVENOUS

## 2021-03-12 MED ORDER — VANCOMYCIN HCL 1000 MG IV SOLR
1000.0000 mg | Freq: Once | INTRAVENOUS | Status: DC
  Filled 2021-03-12: qty 20

## 2021-03-12 MED ORDER — ACETAMINOPHEN 10 MG/ML IV SOLN
INTRAVENOUS | Status: AC
  Filled 2021-03-12: qty 100

## 2021-03-12 SURGICAL SUPPLY — 81 items
ADH SKN CLS APL DERMABOND .7 (GAUZE/BANDAGES/DRESSINGS) ×1
APL PRP STRL LF DISP 70% ISPRP (MISCELLANEOUS) ×2
BASEPLATE P2 COATD GLND 6.5X30 (Shoulder) ×1 IMPLANT
BIT DRILL 4 DIA CALIBRATED (BIT) ×2 IMPLANT
BIT DRILL GUIDE PATIENT MATCH (MISCELLANEOUS) ×1 IMPLANT
BIT DRILL RIGD1.8MM FBRTK STRL (DRILL) IMPLANT
BLADE SAW SAG 25.4X90 (BLADE) ×2 IMPLANT
BSPLAT GLND 30 STRL LF SHLDR (Shoulder) ×1 IMPLANT
CHLORAPREP W/TINT 26 (MISCELLANEOUS) ×4 IMPLANT
COOLER POLAR GLACIER W/PUMP (MISCELLANEOUS) ×2 IMPLANT
COVER BACK TABLE REUSABLE LG (DRAPES) ×2 IMPLANT
DERMABOND ADVANCED (GAUZE/BANDAGES/DRESSINGS) ×1
DERMABOND ADVANCED .7 DNX12 (GAUZE/BANDAGES/DRESSINGS) ×1 IMPLANT
DRAPE 3/4 80X56 (DRAPES) ×4 IMPLANT
DRAPE IMP U-DRAPE 54X76 (DRAPES) ×4 IMPLANT
DRAPE INCISE IOBAN 66X45 STRL (DRAPES) ×4 IMPLANT
DRAPE U-SHAPE 47X51 STRL (DRAPES) ×4 IMPLANT
DRILL GUIDE PATIENT MATCH (MISCELLANEOUS) ×2
DRILL RIGID 1.8MM FBRTK STRL (DRILL)
DRSG OPSITE POSTOP 3X4 (GAUZE/BANDAGES/DRESSINGS) IMPLANT
DRSG OPSITE POSTOP 4X6 (GAUZE/BANDAGES/DRESSINGS) ×2 IMPLANT
DRSG OPSITE POSTOP 4X8 (GAUZE/BANDAGES/DRESSINGS) ×2 IMPLANT
DRSG TEGADERM 2-3/8X2-3/4 SM (GAUZE/BANDAGES/DRESSINGS) ×2 IMPLANT
DRSG TEGADERM 4X10 (GAUZE/BANDAGES/DRESSINGS) IMPLANT
ELECT REM PT RETURN 9FT ADLT (ELECTROSURGICAL) ×2
ELECTRODE REM PT RTRN 9FT ADLT (ELECTROSURGICAL) ×1 IMPLANT
GAUZE 4X4 16PLY ~~LOC~~+RFID DBL (SPONGE) ×2 IMPLANT
GAUZE XEROFORM 1X8 LF (GAUZE/BANDAGES/DRESSINGS) IMPLANT
GLOVE SRG 8 PF TXTR STRL LF DI (GLOVE) ×2 IMPLANT
GLOVE SURG ORTHO LTX SZ8 (GLOVE) ×4 IMPLANT
GLOVE SURG SYN 8.0 (GLOVE) ×2 IMPLANT
GLOVE SURG UNDER POLY LF SZ8 (GLOVE) ×4
GOWN STRL REUS W/ TWL LRG LVL3 (GOWN DISPOSABLE) ×2 IMPLANT
GOWN STRL REUS W/ TWL XL LVL3 (GOWN DISPOSABLE) ×1 IMPLANT
GOWN STRL REUS W/TWL LRG LVL3 (GOWN DISPOSABLE) ×4
GOWN STRL REUS W/TWL XL LVL3 (GOWN DISPOSABLE) ×2
HEMOVAC 400CC 10FR (MISCELLANEOUS) ×2 IMPLANT
HUMERA STEM SM SHELL SHOU 10 (Miscellaneous) ×2 IMPLANT
INSERT SMALL SOCKET 32MM NEU (Insert) ×2 IMPLANT
KIT STABILIZATION SHOULDER (MISCELLANEOUS) ×2 IMPLANT
MANIFOLD NEPTUNE II (INSTRUMENTS) ×2 IMPLANT
MASK FACE SPIDER DISP (MASK) ×2 IMPLANT
MAT ABSORB  FLUID 56X50 GRAY (MISCELLANEOUS) ×1
MAT ABSORB FLUID 56X50 GRAY (MISCELLANEOUS) ×1 IMPLANT
NEEDLE REVERSE CUT 1/2 CRC (NEEDLE) ×2 IMPLANT
NEEDLE SPNL 20GX3.5 QUINCKE YW (NEEDLE) ×2 IMPLANT
NS IRRIG 1000ML POUR BTL (IV SOLUTION) ×2 IMPLANT
P2 COATDE GLNOID BSEPLT 6.5X30 (Shoulder) ×2 IMPLANT
PACK ARTHROSCOPY SHOULDER (MISCELLANEOUS) ×2 IMPLANT
PAD WRAPON POLAR SHDR XLG (MISCELLANEOUS) ×1 IMPLANT
PASSER SUT SWANSON 36MM LOOP (INSTRUMENTS) IMPLANT
PENCIL SMOKE EVACUATOR (MISCELLANEOUS) ×2 IMPLANT
PULSAVAC PLUS IRRIG FAN TIP (DISPOSABLE) ×2
RETRIEVER SUT HEWSON (MISCELLANEOUS) IMPLANT
SCREW BONE LOCKING RSP 5.0X34 (Screw) ×2 IMPLANT
SCREW BONE RSP LOCK 5X26 (Screw) ×2 IMPLANT
SCREW BONE RSP LOCK 5X34 (Screw) ×1 IMPLANT
SCREW BONE RSP LOCKING 5.0X26 (Screw) ×4 IMPLANT
SCREW NONLOCK BASEPLATE 3.5X22 (Shoulder) ×2 IMPLANT
SCREW RETAIN W/HEAD 4MM OFFSET (Shoulder) ×2 IMPLANT
SLING ULTRA II LG (MISCELLANEOUS) ×2 IMPLANT
SLING ULTRA II M (MISCELLANEOUS) IMPLANT
SPONGE T-LAP 18X18 ~~LOC~~+RFID (SPONGE) ×6 IMPLANT
STAPLER SKIN PROX 35W (STAPLE) IMPLANT
STEM HUMERAL SM SHELL SHOU 10 (Miscellaneous) ×1 IMPLANT
STRAP SAFETY 5IN WIDE (MISCELLANEOUS) ×4 IMPLANT
STRIP CLOSURE SKIN 1/2X4 (GAUZE/BANDAGES/DRESSINGS) IMPLANT
SUT FIBERWIRE #2 38 BLUE 1/2 (SUTURE) ×8
SUT MNCRL AB 4-0 PS2 18 (SUTURE) IMPLANT
SUT PROLENE 6 0 P 1 18 (SUTURE) ×2 IMPLANT
SUT TICRON 2-0 30IN 311381 (SUTURE) ×6 IMPLANT
SUT VIC AB 0 CT1 36 (SUTURE) ×2 IMPLANT
SUT VIC AB 2-0 CT2 27 (SUTURE) ×4 IMPLANT
SUTURE FIBERWR #2 38 BLUE 1/2 (SUTURE) ×4 IMPLANT
SYR 20ML LL LF (SYRINGE) ×2 IMPLANT
SYR 30ML LL (SYRINGE) ×4 IMPLANT
TIP FAN IRRIG PULSAVAC PLUS (DISPOSABLE) ×1 IMPLANT
TRAY FOLEY SLVR 16FR LF STAT (SET/KITS/TRAYS/PACK) IMPLANT
WATER STERILE IRR 1000ML POUR (IV SOLUTION) ×2 IMPLANT
WATER STERILE IRR 500ML POUR (IV SOLUTION) IMPLANT
WRAPON POLAR PAD SHDR XLG (MISCELLANEOUS) ×2

## 2021-03-12 NOTE — Progress Notes (Signed)
OT at bedside. 

## 2021-03-12 NOTE — Discharge Instructions (Addendum)
Shelly Albee, MD  [facility]  Phone: [phone]  Fax: [phone]   Discharge Instructions after Reverse Shoulder Replacement    1. Activity/Sling: You are to be non-weight bearing on operative extremity. A sling/shoulder immobilizer has been provided for you. Only remove the sling to perform elbow, wrist, and hand RoM exercises and hygiene/dressing. Active reaching and lifting are not permitted. You will be given further instructions on sling use at your first physical therapy visit and postoperative visit with Dr. Allena Wright.   2. Dressings: Dressing Paszkiewicz be removed at 1st physical therapy visit (~3-4 days after surgery). Afterwards, you Veras either leave open to air (if no drainage) or cover with dry, sterile dressing. If you have steri-strips on your wound, please do not remove them. They will fall off on their own. You Gattuso shower 5 days after surgery. Please pat incision dry. Do not rub or place any shear forces across incision. If there is drainage or any opening of incision after 5 days, please notify our offices immediately.    3. Driving:  Plan on not driving for six weeks. Please note that you are advised NOT to drive while taking narcotic pain medications as you Clites be impaired and unsafe to drive.   4. Medications:  - You have been provided a prescription for narcotic pain medicine (usually oxycodone). After surgery, take 1-2 narcotic tablets every 4 hours if needed for severe pain. Please start this as soon as you begin to start having pain (if you received a nerve block, start taking as soon as this wears off).  - A prescription for anti-nausea medication will be provided in case the narcotic medicine causes nausea - take 1 tablet every 6 hours only if nauseated.  - Take enteric coated aspirin 325 mg once daily for 6 weeks to prevent blood clots. Do not take aspirin if you have an aspirin sensitivity/allergy or asthma or are on an anticoagulant (blood thinner) already. If so, then  your home anticoagulant will be resume and managed - do not take aspirin. -Take tylenol 1000mg  (2 Extra strength or 3 regular strength tablets) every 8 hours for pain. This will reduce the amount of narcotic medication needed. Archambeau stop tylenol when you are having minimal pain. - Take a stool softener (Colace, Dulcolax or Senakot) if you are using narcotic pain medications to help with constipation that is associated with narcotic use. - DO NOT take ANY nonsteroidal anti-inflammatory pain medications: Advil, Motrin, Ibuprofen, Aleve, Naproxen, or Naprosyn.   If you are taking prescription medication for anxiety, depression, insomnia, muscle spasm, chronic pain, or for attention deficit disorder you are advised that you are at a higher risk of adverse effects with use of narcotics post-op, including narcotic addiction/dependence, depressed breathing, death. If you use non-prescribed substances: alcohol, marijuana, cocaine, heroin, methamphetamines, etc., you are at a higher risk of adverse effects with use of narcotics post-op, including narcotic addiction/dependence, depressed breathing, death. You are advised that taking > 50 morphine milligram equivalents (MME) of narcotic pain medication per day results in twice the risk of overdose or death. For your prescription provided: oxycodone 5 mg - taking more than 6 tablets per day after the first few days of surgery.   5. Physical Therapy: We will plan for home health physical therapy (therapist to come to your house). This [place] start anytime in the first 2-3 weeks after surgery.    6. 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 Full work Daniely take 6 weeks if light motions and function of both arms is required. Lifting jobs Krell require 12 weeks.   7. Post-Op Appointments: Your first post-op appointment will be with Dr. Allena Wright in  approximately 2 weeks time.    If you find that they have not been scheduled please call the Orthopaedic Appointment front desk at [phone].                               Shelly Albee, MD [facility] Phone: [phone] Fax: [phone]   REVERSE SHOULDER ARTHROPLASTY REHAB GUIDELINES   These guidelines should be tailored to individual patients based on their rehab goals, age, precautions, quality of repair, etc.  Progression should be based on patient progress and approval by the referring physician.  PHASE 1 - Day 1 through Week 2  GENERAL GUIDELINES AND PRECAUTIONS Sling wear 24/7 except during grooming and home exercises (3 to 5 times daily) Avoid shoulder extension such that the arm is posterior the frontal plane.  When patients recline, a pillow should be placed behind the upper arm and sling should be on.  They should be advised to always be able to see the elbow Avoid combined IR/ADD/EXT, such as hand behind back to prevent dislocation Avoid combined IR and ADD such as reaching across the chest to prevent dislocation No AROM No submersion in pool/water for 4 weeks No weight bearing through operative arm (as in transfers, walker use, etc.)  GOALS Maintain integrity of joint replacement; protect soft tissue healing Increase PROM for elevation to 120 and ER to 30 (will remain the goal for first 6 weeks) Optimize distal UE circulation and muscle activity (elbow, wrist and hand) Instruct in use of sling for proper fit, polar care device for ice application after HEP, signs/symptoms of infection  EXERCISES Active elbow, wrist and hand Passive forward elevation in scapular plane to 90-120 max motion; ER in scapular plane to 30 Active scapular retraction with arms resting in neutral position  CRITERIA TO PROGRESS TO PHASE 2 Low pain (less than 3/10) with shoulder PROM Healing of incision without signs of infection Clearance by MD to  advance after 2 week MD check up  PHASE 2 - 2 weeks - 6 weeks  GENERAL GUIDELINES AND PRECAUTIONS Sling Kushner be removed while at home; worn in community without abduction pillow Kolbe use arm for light activities of daily living (such as feeding, brushing teeth, dressing.) with elbow near  the side of the body  and arm in front of the body- no active lifting of the arm Yarberry submerge in water (tub, pool, [place], etc.) after 4 weeks Continue to avoid WBing through the operative arm Continue to avoid combined IR/EXT/ADD (hand behind the back) and IR/ADD  (reaching across chest) for dislocation precautions  GOALS  Achieve passive elevation to 120 and ER to 30  Low (less than 3/10) to no pain  Ability to fire all heads of the deltoid  EXERCISES Willadsen discontinue grip, and active elbow and wrist exercises since using the arm in ADL's  with sling removed around the home Continue passive elevation to 120 and ER to 30, both in scapular plane with arm supported on table top Add submaximal isometrics, pain free effort, for all functional heads of deltoid (anterior, posterior, middle)  Ensure that with posterior deltoid isometric the shoulder  does not move into extension and the arm remains anterior the frontal plane At 4 weeks:  begin to place arm in balanced position of 90 deg elevation in supine; when patient able to hold this position with ease, Dise begin reverse pendulums clockwise and counterclockwise  CRITERIA TO PROGRESS TO PHASE 3 Passive forward elevation in scapular plane to 120; passive ER in scapular plane to 30 Ability to fire isometrically all heads of the deltoid muscle without pain Ability to place and hold the arm in balanced position (90 deg elevation in supine)  PHASE 3 - 6 weeks to 3 months  GENERAL GUIDELINES AND PRECAUTIONS Discontinue use of sling Avoid forcing end range motion in any direction to prevent dislocation  Lack advance use of the arm actively in ADL's without being  restricted to arm by the side of the body, however, avoid heavy lifting and sports (forever!) [place] initiate functional IR behind the back gently NO UPPER BODY ERGOMETER   GOALS Optimize PROM for elevation and ER in scapular plane with realistic expectation that max  mobility for elevation is usually around 145-160 passively; ER 40 to 50 passively; functional IR to L1 Recover AROM to approach as close to PROM available as possible; Wahler expect 135-150 deg active elevation; 30 deg active ER; active functional IR to L1 Establish dynamic stability of the shoulder with deltoid and periscapular muscle gradual strengthening  EXERCISES Forward elevation in scapular plane active progression: supine to incline, to vertical; short to long lever arm Balanced position long lever arm AROM Active ER/IR with arm at side Scapular retraction with light band resistance Functional IR with hand slide up back - very gentle and gradual NO UPPER BODY ERGOMETER     CRITERIA TO PROGRESS TO PHASE 4  AROM equals/approaches PROM with good mechanics for elevation   No pain  Higher level demand on shoulder than ADL functions   PHASE 4 12 months and beyond  GENERAL GUIDELINES AND PRECAUTIONS No heavy lifting and no overhead sports No heavy pushing activity Gradually increase strength of deltoid and scapular stabilizers; also the rotator cuff if present with weights not to exceed 5 lbs NO UPPER BODY ERGOMETER   GOALS  Optimize functional use of the operative UE to meet the desired demands  Gradual increase in deltoid, scapular muscle, and rotator cuff strength  Pain free functional activities   EXERCISES Add light hand weights for deltoid up to and not to exceed 3 lbs for anterior and posterior with long arm lift against gravity; elbow bent to 90 deg for abduction in scapular plane Theraband progression for extension to hip with scapular depression/retraction Theraband progression for serratus anterior punches  in supine; avoid wall, incline or prone pressups for serratus anterior End range stretching gently without forceful overpressure in all planes (elevation in scapular plane, ER in scapular plane, functional IR) with stretching done for life as part of a daily routine NO UPPER BODY ERGOMETER     CRITERIA FOR DISCHARGE FROM SKILLED PHYSICAL THERAPY  Pain free AROM for shoulder elevation (expect around 135-150)  Functional strength for all ADL's, work tasks, and hobbies approved by Careers adviser  Independence with home maintenance program   NOTES: 1. With proper exercise, motion, strength, and function continue to improve even after one year. 2. The complication rate after surgery is 5 - 8%. Complications include infection, fracture, heterotopic bone formation, nerve injury, instability, rotator cuff tear, and tuberosity nonunion. Please look for clinical signs, unusual symptoms, or lack of progress with  therapy and report those to Dr. Allena Wright. Prefer more communication than less.  3. The therapy plan above only serves as a guide. Please be aware of specific individualized patient instructions as written on the prescription or through discussions with the surgeon. 4. Please call Dr. Allena Wright if you have any specific questions or concerns [phone]   AMBULATORY SURGERY  DISCHARGE INSTRUCTIONS   The drugs that you were given will stay in your system until tomorrow so for the next 24 hours you should not:  Drive an automobile Make any legal decisions Drink any alcoholic beverage   You Dawkins resume regular meals tomorrow.  Today it is better to start with liquids and gradually work up to solid foods.  You Babineau eat anything you prefer, but it is better to start with liquids, then soup and crackers, and gradually work up to solid foods.   Please notify your doctor immediately if you have any unusual bleeding, trouble breathing, redness and pain at the surgery site, drainage, fever, or pain not relieved  by medication.    Your post-operative visit with Dr.                                       is: Date:                        Time:    Please call to schedule your post-operative visit.  Additional Instructions:

## 2021-03-12 NOTE — TOC Initial Note (Signed)
Transition of Care [facility]) - CM/SW Discharge Note   Patient Details  Name: Shelly Wright MRN: [id number] Date of Birth: Mar 25, 1971  Transition of Care [facility] CM/SW Contact:  [place] Cellar, RN Phone Number: 03/12/2021, 2:21 PM   Clinical Narrative:    Notified by RN patient in need of BSC to be delivered to home. LVMM for [facility] requesting DME.          Patient Goals and CMS Choice        Discharge Placement                       Discharge Plan and Services                                     Social Determinants of Health (SDOH) Interventions     Readmission Risk Interventions No flowsheet data found.

## 2021-03-12 NOTE — Anesthesia Postprocedure Evaluation (Signed)
Anesthesia Post Note  Patient: Shelly Wright  Procedure(s) Performed: Right reverse shoulder arthroplasty, biceps tenodesis (Right: Shoulder)  Patient location during evaluation: PACU Anesthesia Type: General Level of consciousness: awake and alert Pain management: pain level controlled Vital Signs Assessment: post-procedure vital signs reviewed and stable Respiratory status: spontaneous breathing, nonlabored ventilation and respiratory function stable Cardiovascular status: blood pressure returned to baseline and stable Postop Assessment: no apparent nausea or vomiting Anesthetic complications: no   No notable events documented.   Last Vitals:  Vitals:   03/12/21 1130 03/12/21 1215  BP: (!) 157/79 (!) 149/73  Pulse: 85 80  Resp: 20 20  Temp: (!) 36.3 C (!) 36.4 C  SpO2: 94% 95%    Last Pain:  Vitals:   03/12/21 1230  TempSrc:   PainSc: 4                  Foye Deer

## 2021-03-12 NOTE — Progress Notes (Signed)
Patient awake, alert. Tolerated lunch tray:  100%  No c/o's

## 2021-03-12 NOTE — Anesthesia Procedure Notes (Signed)
Anesthesia Regional Block: Interscalene brachial plexus block   Pre-Anesthetic Checklist: , timeout performed,  Correct Patient, Correct Site, Correct Laterality,  Correct Procedure, Correct Position, site marked,  Risks and benefits discussed,  Surgical consent,  Pre-op evaluation,  At surgeon's request and post-op pain management  Laterality: Upper and Right  Prep: chloraprep       Needles:  Injection technique: Single-shot  Needle Type: Stimiplex     Needle Length: 5cm  Needle Gauge: 22     Additional Needles:   Procedures:,,,, ultrasound used (permanent image in chart),,    Narrative:  Start time: 03/12/2021 7:15 AM End time: 03/12/2021 7:17 AM Injection made incrementally with aspirations every 5 mL.  Performed by: Personally  Anesthesiologist: Foye Deer, MD  Additional Notes: Patient consented for risk and benefits of nerve block including but not limited to nerve damage, failed block, bleeding and infection.  Patient voiced understanding.  Functioning IV was confirmed and monitors were applied.  Timeout done prior to procedure and prior to any sedation being given to the patient.  Patient confirmed procedure site prior to any sedation given to the patient.  A 82mm 22ga Stimuplex needle was used. Sterile prep,hand hygiene and sterile gloves were used.  Minimal sedation used for procedure.  No paresthesia endorsed by patient during the procedure.  Negative aspiration and negative test dose prior to incremental administration of local anesthetic. The patient tolerated the procedure well with no immediate complications.

## 2021-03-12 NOTE — H&P (Signed)
Paper H&P to be scanned into permanent record. H&P reviewed. No significant changes noted.  

## 2021-03-12 NOTE — Progress Notes (Signed)
PT and OT have seen patient while in post-op. ABX infusing as ordered, hemovac to be removed after, Dr. [name] notified

## 2021-03-12 NOTE — Anesthesia Procedure Notes (Signed)
Procedure Name: Intubation Date/Time: 03/12/2021 7:42 AM Performed by: Mohammed Kindle, CRNA Pre-anesthesia Checklist: Patient identified, Emergency Drugs available, Suction available and Patient being monitored Patient Re-evaluated:Patient Re-evaluated prior to induction Oxygen Delivery Method: Circle system utilized Preoxygenation: Pre-oxygenation with 100% oxygen Induction Type: IV induction Ventilation: Mask ventilation without difficulty Laryngoscope Size: McGraph and 3 Grade View: Grade I Tube type: Oral Tube size: 7.0 mm Number of attempts: 1 Airway Equipment and Method: Stylet and Oral airway Placement Confirmation: ETT inserted through vocal cords under direct vision, positive ETCO2, breath sounds checked- equal and bilateral and CO2 detector Secured at: 21 cm Tube secured with: Tape Dental Injury: Teeth and Oropharynx as per pre-operative assessment

## 2021-03-12 NOTE — Progress Notes (Signed)
Baldwin Jamaica PA removed hemavac without event, patient tolerated procedure.  Verbalizes understanding of plan of care and follow up.

## 2021-03-12 NOTE — Evaluation (Signed)
Physical Therapy Evaluation Patient Details Name: Shelly Wright MRN: [id number] DOB: [birth year] Today's Date: 03/12/2021  History of Present Illness  Shelly Wright is a 50 y.o. 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 S/p  Right reverse total shoulder arthroplasty on 03/12/21.  Clinical Impression  Pt alert, seated in recliner with spouse present. Pt denies pain and overall pleasant and agreeable to therapy session. PLOF is mod-I for amb w/ SPC and independent for all ADLs. Pt ambulated 160 ft w/ SPC without LOB, supv for safety. Decreased reciprocal gait pattern secondary to R RTSA but not overly reliant on cane for stability. Pt did not demonstrate difficulty switching cane from R to L hand (previously carried SPC in R only), supv. Pt showed good concentric and eccentric control and stability ascending & descending the stairs without the use of a handrail.  Discharge recommendations are to follow physician's recommendations for future therapy needs throughout the recovery process.    Recommendations for follow up therapy are one component of a multi-disciplinary discharge planning process, led by the attending physician.  Recommendations Vollman be updated based on patient status, additional functional criteria and insurance authorization.  Follow Up Recommendations Follow physician's recommendations for discharge plan and follow up therapies    Assistance Recommended at Discharge Intermittent Supervision/Assistance  Functional Status Assessment Patient has had a recent decline in their functional status and demonstrates the ability to make significant improvements in function in a reasonable and predictable amount of time.  Equipment Recommendations  3in1 (PT)    Recommendations for Other Services       Precautions / Restrictions Precautions Precautions: Shoulder Shoulder  Interventions: Shoulder sling/immobilizer;Shoulder abduction pillow;Off for dressing/bathing/exercises Precaution Booklet Issued: Yes (comment) Required Braces or Orthoses: Sling Restrictions Weight Bearing Restrictions: Yes RUE Weight Bearing: Non weight bearing      Mobility  Bed Mobility               General bed mobility comments: Pt seated in recliner beginning and end of session    Transfers Overall transfer level: Needs assistance Equipment used: Straight cane Transfers: Sit to/from Stand Sit to Stand: Modified independent (Device/Increase time)                Ambulation/Gait Ambulation/Gait assistance: Supervision Gait Distance (Feet): 160 Feet Assistive device: Straight cane Gait Pattern/deviations: Step-through pattern;Decreased step length - left;Decreased stance time - right     General Gait Details: SUPV for safety, no LOB noted  Stairs Stairs: Yes Stairs assistance: Supervision Stair Management: No rails Number of Stairs: 4 General stair comments: no LOB, good control, step-through ascendnig, step-to descending stairs  Wheelchair Mobility    Modified Rankin (Stroke Patients Only)       Balance Overall balance assessment: Needs assistance Sitting-balance support: No upper extremity supported;Feet supported Sitting balance-Leahy Scale: Normal     Standing balance support: Single extremity supported;During functional activity Standing balance-Leahy Scale: Good Standing balance comment: Completes stairs without UE support or LOB                             Pertinent Vitals/Pain Pain Assessment: No/denies pain    Home Living Family/patient expects to be discharged to:: Private residence Living Arrangements: Spouse/significant other Available Help at Discharge: Family;Available 24 hours/day Type of Home: Mobile home Home Access: Stairs to enter Entrance Stairs-Rails: None Entrance Stairs-Number of Steps: 4   Home  Layout: One level Home Equipment: Cane - single point      Prior Function Prior Level of Function : Independent/Modified Independent             Mobility Comments: Ambulates w/ SPC for household and community distances       Hand Dominance   Dominant Hand: Right    Extremity/Trunk Assessment   Upper Extremity Assessment Upper Extremity Assessment: RUE deficits/detail RUE: Unable to fully assess due to immobilization    Lower Extremity Assessment Lower Extremity Assessment: Overall WFL for tasks assessed       Communication   Communication: No difficulties  Cognition Arousal/Alertness: Awake/alert Behavior During Therapy: WFL for tasks assessed/performed Overall Cognitive Status: Within Functional Limits for tasks assessed                                          General Comments      Exercises Other Exercises Other Exercises: Pt and husband educated re: OT role, DME recs, d/c recs, falls prevention, polar care mgmt, sling mgmt, adapted dressing techniques, sleep routne modifications Other Exercises: LBD, don/doff sling/polar care/shirt/strapless bra, sit<>stand, sitting/standing balance/tolerance   Assessment/Plan    PT Assessment Patient needs continued PT services  PT Problem List Decreased strength;Decreased range of motion;Decreased activity tolerance;Decreased mobility       PT Treatment Interventions DME instruction;Stair training;Functional mobility training;Therapeutic activities;Therapeutic exercise    PT Goals (Current goals can be found in the Care Plan section)  Acute Rehab PT Goals Patient Stated Goal: To go home PT Goal Formulation: With patient Time For Goal Achievement: 03/26/21 Potential to Achieve Goals: Good    Frequency BID   Barriers to discharge        Co-evaluation               AM-PAC PT "6 Clicks" Mobility  Outcome Measure Help needed turning from your back to your side while in a flat bed without  using bedrails?: None Help needed moving from lying on your back to sitting on the side of a flat bed without using bedrails?: None Help needed moving to and from a bed to a chair (including a wheelchair)?: None Help needed standing up from a chair using your arms (e.g., wheelchair or bedside chair)?: None Help needed to walk in hospital room?: A Little Help needed climbing 3-5 steps with a railing? : A Little 6 Click Score: 22    End of Session   Activity Tolerance: Patient tolerated treatment well Patient left: in chair;with call bell/phone within reach;with family/visitor present Nurse Communication: Mobility status PT Visit Diagnosis: Muscle weakness (generalized) (M62.81);Other abnormalities of gait and mobility (R26.89)    Time: 5400-8676 PT Time Calculation (min) (ACUTE ONLY): 9 min   Charges:             [employer], SPT

## 2021-03-12 NOTE — Transfer of Care (Signed)
Immediate Anesthesia Transfer of Care Note  Patient: Shelly Wright  Procedure(s) Performed: Right reverse shoulder arthroplasty, biceps tenodesis (Right: Shoulder)  Patient Location: PACU  Anesthesia Type:General  Level of Consciousness: awake, drowsy and patient cooperative  Airway & Oxygen Therapy: Patient Spontanous Breathing  Post-op Assessment: Report given to RN and Post -op Vital signs reviewed and stable  Post vital signs: Reviewed and stable  Last Vitals:  Vitals Value Taken Time  BP 124/64 03/12/21 1034  Temp    Pulse 88 03/12/21 1038  Resp 23 03/12/21 1038  SpO2 96 % 03/12/21 1038  Vitals shown include unvalidated device data.  Last Pain:  Vitals:   03/12/21 0616  TempSrc: Temporal  PainSc: 3          Complications: No notable events documented.

## 2021-03-12 NOTE — Evaluation (Signed)
Occupational Therapy Evaluation Patient Details Name: Shelly Wright MRN: [id number] DOB: [birth year] Today's Date: 03/12/2021   History of Present Illness Shelly Wright is a 50 y.o. 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 S/p  Right reverse total shoulder arthroplasty on 03/12/21.   Clinical Impression   [name] was seen for OT evaluation this date. Prior to hospital admission, pt was MOD I for mobility and I/ADLs using SPC. Pt lives with husband in mobile home c 4 STE, husband works and plan for cousin to stay with pt during the day. Pt presents to acute OT demonstrating impaired ADL performance and functional mobility 2/2 decreased activity tolerance and functional strength/ROM/balance deficits. Pt currently requires MAX A don/doff sling and polar care sitting EOC. MOD A don shirt, MOD I don shoes sitting EOC. SBA + HHA for ADL t/f. Pt and husband instructed in polar care mgt, compression stockings mgt, sling/immobilizer mgt, ROM exercises for RUE (with instructions for no shoulder exercises until full sensation has returned), RUE precautions, adaptive strategies for bathing/dressing, positioning and considerations for sleep, and home/routines modifications. Handout provided. Pt would benefit from skilled OT to address noted impairments and functional limitations (see below for any additional details) in order to maximize safety and independence while minimizing falls risk and caregiver burden. Upon hospital discharge, recommend following surgeons recommendation for follow up therapies.        Recommendations for follow up therapy are one component of a multi-disciplinary discharge planning process, led by the attending physician.  Recommendations [place] be updated based on patient status, additional functional criteria and insurance authorization.   Follow Up Recommendations  Follow  physician's recommendations for discharge plan and follow up therapies    Assistance Recommended at Discharge Intermittent Supervision/Assistance  Functional Status Assessment  Patient has had a recent decline in their functional status and demonstrates the ability to make significant improvements in function in a reasonable and predictable amount of time.  Equipment Recommendations  [facility]    Recommendations for Other Services       Precautions / Restrictions Precautions Precautions: Shoulder Shoulder Interventions: Shoulder sling/immobilizer;Shoulder abduction pillow;Off for dressing/bathing/exercises Precaution Booklet Issued: Yes (comment) Required Braces or Orthoses: Sling Restrictions Weight Bearing Restrictions: Yes RUE Weight Bearing: Non weight bearing      Mobility Bed Mobility               General bed mobility comments: received and left in chair    Transfers Overall transfer level: Needs assistance Equipment used: 1 person hand held assist Transfers: Sit to/from Stand Sit to Stand: Supervision                  Balance Overall balance assessment: Needs assistance Sitting-balance support: No upper extremity supported;Feet supported Sitting balance-Leahy Scale: Normal     Standing balance support: Single extremity supported;During functional activity Standing balance-Leahy Scale: Good                             ADL either performed or assessed with clinical judgement   ADL Overall ADL's : Needs assistance/impaired                                       General ADL Comments: MAX A don/doff sling and polar care sitting EOC. MOD A don  shirt, MOD I don shoes sitting EOC. SBA + HHA for ADL t/f      Pertinent Vitals/Pain Pain Assessment: No/denies pain     Hand Dominance Right   Extremity/Trunk Assessment Upper Extremity Assessment Upper Extremity Assessment: RUE deficits/detail RUE: Unable to fully assess due to  immobilization   Lower Extremity Assessment Lower Extremity Assessment: Overall WFL for tasks assessed       Communication Communication Communication: No difficulties   Cognition Arousal/Alertness: Awake/alert Behavior During Therapy: WFL for tasks assessed/performed Overall Cognitive Status: Within Functional Limits for tasks assessed                                       General Comments       Exercises Exercises: Other exercises Other Exercises Other Exercises: Pt and husband educated re: OT role, DME recs, d/c recs, falls prevention, polar care mgmt, sling mgmt, adapted dressing techniques, sleep routne modifications Other Exercises: LBD, don/doff sling/polar care/shirt/strapless bra, sit<>stand, sitting/standing balance/tolerance   Shoulder Instructions      Home Living Family/patient expects to be discharged to:: Private residence Living Arrangements: Spouse/significant other Available Help at Discharge: Family;Available 24 hours/day (cousin staying with pt during day) Type of Home: Mobile home Home Access: Stairs to enter Entrance Stairs-Number of Steps: 4 Entrance Stairs-Rails: None Home Layout: One level     Bathroom Shower/Tub: Producer, television/film/video: Standard Bathroom Accessibility: No   Home Equipment: Cane - single point          Prior Functioning/Environment Prior Level of Function : Independent/Modified Independent             Mobility Comments: SPC for mobillty          OT Problem List: Decreased range of motion;Decreased activity tolerance;Impaired UE functional use      OT Treatment/Interventions: Self-care/ADL training;Therapeutic exercise;DME and/or AE instruction;Energy conservation    OT Goals(Current goals can be found in the care plan section) Acute Rehab OT Goals Patient Stated Goal: to go home OT Goal Formulation: With patient/family Time For Goal Achievement: 03/26/21 Potential to Achieve  Goals: Good ADL Goals Pt Will Perform Grooming: with set-up;with modified independence;standing Pt Will Perform Lower Body Dressing: with min assist;with caregiver independent in assisting;sit to/from stand  OT Frequency: Min 1X/week    AM-PAC OT "6 Clicks" Daily Activity     Outcome Measure Help from another person eating meals?: A Little Help from another person taking care of personal grooming?: A Little Help from another person toileting, which includes using toliet, bedpan, or urinal?: A Little Help from another person bathing (including washing, rinsing, drying)?: A Lot Help from another person to put on and taking off regular upper body clothing?: A Lot Help from another person to put on and taking off regular lower body clothing?: A Little 6 Click Score: 16   End of Session Nurse Communication: Mobility status  Activity Tolerance: Patient tolerated treatment well Patient left: in chair;with call bell/phone within reach;with nursing/sitter in room;with family/visitor present  OT Visit Diagnosis: Muscle weakness (generalized) (M62.81)                Time: 9924-2683 OT Time Calculation (min): 32 min Charges:  OT General Charges $OT Visit: 1 Visit OT Evaluation $OT Eval Low Complexity: 1 Low OT Treatments $Self Care/Home Management : 23-37 mins  Kathie Dike, M.S. OTR/L  03/12/21, 2:40 PM  ascom [phone]

## 2021-03-12 NOTE — Op Note (Signed)
SURGERY DATE: 03/12/2021   PRE-OP DIAGNOSIS:  1. Right shoulder rotator cuff arthropathy   POST-OP DIAGNOSIS:  1. Right shoulder rotator cuff arthropathy   PROCEDURES:  1. Right reverse total shoulder arthroplasty   SURGEON: Cato Mulligan, MD  ASSISTANTS: Reche Dixon, PA; Prescilla Sours, PA-S    ANESTHESIA: Gen + Exparel interscalene block   ESTIMATED BLOOD LOSS: 200cc   TOTAL IV FLUIDS: per anesthesia record  IMPLANTS: [employer]: RSP Glenoid Head w/Retaining screw 32-4; Monoblock Reverse Shoulder Baseplate with 0.9BD central screw; 3 locking screws into baseplate (superior, posterior, inferior); Small Shell Short Humeral Stem 10 x 7m; Neutral Small Socket Insert;    INDICATION(S):  Shelly Wright is a 50y.o. female with chronic shoulder pain with difficulty with lifting arm overhead. Imaging consistent with massive, irreparable rotator cuff tear involving the supraspinatus, infraspinatus, and subscapularis. Conservative measures including medications, exercises, and cortisone injections have not provided adequate relief. After discussion of risks, benefits, and alternatives to surgery, the patient elected to proceed with reverse shoulder arthroplasty.   OPERATIVE FINDINGS: massive rotator cuff tear (complete supraspinatus, infraspinatus, subscapularis tears with retraction medial to the glenoid)   OPERATIVE REPORT:   I identified [name] in the pre-operative holding area. Informed consent was obtained and the surgical site was marked. I reviewed the risks and benefits of the proposed surgical intervention and the patient (and/or patient's guardian) wished to proceed. An interscalene block with Exparel was administered by the Anesthesia team. The patient was transferred to the operative suite and general anesthesia was administered. The patient was placed in the beach chair position with the head of the bed elevated approximately 45 degrees. All down side pressure points were  appropriately padded. Pre-op exam under anesthesia confirmed some stiffness and crepitus. Appropriate IV antibiotics were administered. The extremity was then prepped and draped in standard fashion. A time out was performed confirming the correct extremity, correct patient, and correct procedure.   We used the standard deltopectoral incision from the coracoid to ~12cm distal. We found the cephalic vein and took it laterally. We opened the deltopectoral interval widely and placed retractors under the CA ligament in the subacromial space and under the deltoid tendon at its insertion. We then abducted and internally rotated the arm and released the underlying bursa between these retractors, taking care not to damage the circumflex branch of the axillary nerve.   Next, we brought the arm back in adduction at slight forward flexion with external rotation. We opened the clavipectoral fascia lateral to the conjoint tendon. We gently palpated the axillary nerve and verified its position and continuity on both sides of the humerus with a Tug test. This test was repeated multiple times during the procedure for nerve localization and confirmed to be intact at the end of the case. We then cauterized the anterior humeral circumflex ("Three sisters") vessels. The arm was then internally rotated, we cut the falciform ligament at approximately 1 cm of the upper portion of the pectoralis major insertion. Next we unroofed the bicipital groove.  The bicipital groove was opened and there was no biceps tendon suggesting chronic rupture of the biceps tendon.  At this point, we could see that the supraspinatus and infraspinatus were completely torn with a bald humeral head. The subscapularis was also completely torn.  The remnants of the anterior capsule were peeled off of the anterior humeral head using electrocautery. We released the inferior capsule from the humerus all the way to the posterior band of  the inferior glenohumeral  ligament. When this was complete we gently dislocated the shoulder up into the wound. We removed any osteophytes and made our cut with the appropriate inclination in 30 degrees of retroversion  We then turned our attention back to the glenoid. The proximal humerus was retracted posteriorly. The anterior capsule was dissected free from the subscapularis. The anterior capsule was then excised, exposing the anterior glenoid. We then grasped the labrum and removed it circumferentially. During the glenoid exposure, the axillary nerve was protected the entire time.    A patient-specific guide was used to drill the central guidepin. A tap was placed, matching the trajectory of the guidepin. An appropriately sized reamer was used to ream the glenoid. The monoblock baseplate was inserted and appropriate fixation was achieved. The 3 peripheral screws were drilled, measured, and placed.  This allowed for excellent fixation of the glenoid baseplate such that the entire scapula rotated with attempted movement of the baseplate.  A 32-4 glenosphere was then placed and tightened.   We then turned our attention back to the humerus. We sized for a small shell prosthesis. We sequentially used larger diameter canal finders until we met significant resistance and sequential broaching was performed to this size listed above. The humerus was trialed and noted to have satisfactory stability, motion, and deltoid tension with a 0 poly. The trial implants were removed. The humeral canal was pulse lavaged. Next, the implant was placed with the appropriate retroversion. Stability was confirmed. We placed an 0 poly. The humerus was reduced and motion, tension, and stability were satisfactory. A Hemovac drain was placed.  There was no subscapularis tendon to repair.  We again verified the tension on the axillary nerve, appropriate range of motion, stability of the implant, and security of the subscapularis repair. We closed the  deltopectoral interval deep to the cephalic vein with a running, 0-Vicryl suture. The skin was closed with 2-0 Vicryl and 4-0 Monocryl with Dermabond. Xeroform and Honeycomb dressing was applied. A PolarCare unit and sling were placed. Patient was extubated, transferred to a stretcher bed and to the post antesthesia care unit in stable condition.   Of note, assistance from a PA was essential to performing the surgery.  PA was present for the entire surgery.  PA assisted with patient positioning, retraction, instrumentation, and wound closure. The surgery would have been more difficult and had longer operative time without PA assistance.     POSTOPERATIVE PLAN: The patient will be admitted with plan for discharge home from the PACU. Operative arm to remain in sling at all times except RoM exercises and hygiene. Can perform pendulums, elbow/wrist/hand RoM exercises. Passive RoM allowed to 90 FF and 30 ER. ASA 38m x 6 weeks for DVT ppx. Plan for home health physical therapy. Patient to return to clinic in ~2 weeks for post-operative appointment.

## 2021-03-13 ENCOUNTER — Encounter: Payer: Self-pay | Admitting: Orthopedic Surgery

## 2021-03-14 LAB — SURGICAL PATHOLOGY

## 2021-04-16 ENCOUNTER — Ambulatory Visit: Payer: Medicaid Other | Admitting: Psychiatry

## 2021-05-07 ENCOUNTER — Other Ambulatory Visit
Admission: RE | Admit: 2021-05-07 | Discharge: 2021-05-07 | Disposition: A | Discharge status: Home / Self Care | Payer: Medicaid Other | Attending: Psychiatry | Admitting: Psychiatry

## 2021-05-07 ENCOUNTER — Encounter: Payer: Self-pay | Admitting: Psychiatry

## 2021-05-07 ENCOUNTER — Other Ambulatory Visit: Payer: Self-pay

## 2021-05-07 ENCOUNTER — Ambulatory Visit (INDEPENDENT_AMBULATORY_CARE_PROVIDER_SITE_OTHER): Payer: Medicaid Other | Admitting: Psychiatry

## 2021-05-07 ENCOUNTER — Telehealth: Payer: Self-pay | Admitting: Psychiatry

## 2021-05-07 VITALS — BP 157/82 | HR 87 | Wt 234.6 lb

## 2021-05-07 DIAGNOSIS — F1220 Cannabis dependence, uncomplicated: Secondary | ICD-10-CM | POA: Diagnosis not present

## 2021-05-07 DIAGNOSIS — F3178 Bipolar disorder, in full remission, most recent episode mixed: Secondary | ICD-10-CM

## 2021-05-07 DIAGNOSIS — F17200 Nicotine dependence, unspecified, uncomplicated: Secondary | ICD-10-CM

## 2021-05-07 DIAGNOSIS — E871 Hypo-osmolality and hyponatremia: Secondary | ICD-10-CM | POA: Diagnosis present

## 2021-05-07 DIAGNOSIS — F410 Panic disorder [episodic paroxysmal anxiety] without agoraphobia: Secondary | ICD-10-CM

## 2021-05-07 DIAGNOSIS — F411 Generalized anxiety disorder: Secondary | ICD-10-CM | POA: Diagnosis not present

## 2021-05-07 DIAGNOSIS — R030 Elevated blood-pressure reading, without diagnosis of hypertension: Secondary | ICD-10-CM

## 2021-05-07 HISTORY — DX: Hypo-osmolality and hyponatremia: E87.1

## 2021-05-07 LAB — SODIUM: Sodium: 128 mmol/L — ABNORMAL LOW (ref 135–145)

## 2021-05-07 MED ORDER — VENLAFAXINE HCL ER 37.5 MG PO CP24: 37.5000 mg | ORAL_CAPSULE | Freq: Every day | ORAL | 0 refills | Status: DC

## 2021-05-07 MED ORDER — OXCARBAZEPINE 600 MG PO TABS: ORAL_TABLET | ORAL | 0 refills | Status: DC

## 2021-05-07 MED ORDER — VENLAFAXINE HCL ER 150 MG PO CP24: 150.0000 mg | ORAL_CAPSULE | ORAL | 1 refills | Status: DC

## 2021-05-07 MED ORDER — ARIPIPRAZOLE 20 MG PO TABS: 20.0000 mg | ORAL_TABLET | Freq: Every day | ORAL | 0 refills | Status: DC

## 2021-05-07 MED ORDER — BUSPIRONE HCL 30 MG PO TABS: 30.0000 mg | ORAL_TABLET | Freq: Two times a day (BID) | ORAL | 0 refills | Status: DC

## 2021-05-07 MED ORDER — TRAZODONE HCL 100 MG PO TABS: 200.0000 mg | ORAL_TABLET | Freq: Every evening | ORAL | 0 refills | Status: DC | PRN

## 2021-05-07 NOTE — Progress Notes (Signed)
BH MD OP Progress Note  05/07/2021 1:54 PM Shelly Wright  MRN:  [id number]  Chief Complaint:  Chief Complaint   Follow-up; Depression; Anxiety    HPI: Shelly Wright is a 50 year old Caucasian female on SSI, has a history of bipolar disorder, GAD, panic disorder, cannabis use disorder, tobacco use disorder, married lives in [place] was evaluated in office today.  Patient today reports she is currently doing well, denies any sadness or anxiety symptoms.  Denies any mood swings.  She reports sleep is good as long as she takes the trazodone.  She is compliant on all her medications, denies side effects.  She looks forward to the holiday season and plans to spend it with her family.  Patient reports she continues to smoke cigarettes, is not willing to cut back or quit.  She also smokes cannabis on a daily basis and is not willing to cut back.  Patient with elevated blood pressure in session today, repeat also was elevated.  Patient agrees to follow up with primary care provider, currently not on antihypertensive medication.  Patient with history of low sodium level, reports she was taken off of her antihypertensives however will need repeat levels done.  Patient denies any suicidality, homicidality or perceptual disturbances.      Visit Diagnosis:    ICD-10-CM   1. Bipolar disorder, in full remission, most recent episode mixed (HCC)  F31.78 ARIPiprazole (ABILIFY) 20 MG tablet    2. GAD (generalized anxiety disorder)  F41.1 busPIRone (BUSPAR) 30 MG tablet    traZODone (DESYREL) 100 MG tablet    venlafaxine XR (EFFEXOR XR) 150 MG 24 hr capsule    venlafaxine XR (EFFEXOR-XR) 37.5 MG 24 hr capsule    3. Panic disorder  F41.0 DISCONTINUED: oxcarbazepine (TRILEPTAL) 600 MG tablet    4. Cannabis use disorder, moderate, dependence (HCC)  F12.20     5. Tobacco use disorder  F17.200     6. Hyponatremia  E87.1 Sodium    7. Elevated blood pressure reading  R03.0       Past  Psychiatric History: Reviewed past psychiatric history from progress note on 09/16/2018.  Past trials of venlafaxine, trazodone, Seroquel, hydroxyzine, Zoloft, Lexapro, duloxetine, Paxil  Past Medical History:  Past Medical History:  Diagnosis Date   Acute right-sided low back pain with right-sided sciatica 08/30/2014   Anxiety    Arthritis    Asthma    in past, no current inhalers   Bipolar disorder (HCC)    Cervical spinal cord compression (HCC) 05/07/2012   COPD (chronic obstructive pulmonary disease) (HCC)    DDD (degenerative disc disease), lumbar    Depression    Dyspnea    with exertion   Dysrhythmia    irregular-h/o in the past   Fibromyalgia    Hypertension    Neuritis or radiculitis due to rupture of lumbar intervertebral disc 05/23/2014   Periprosthetic fracture around prosthetic knee 07/31/2015   Restless leg syndrome    Soft tissue lesion of shoulder region 01/24/2012   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   Syncope and collapse 04/08/2014   Thoracic and lumbosacral neuritis 05/07/2012   Thoracic neuritis 05/07/2012    Past Surgical History:  Procedure Laterality Date   ABDOMINAL HYSTERECTOMY     Cervical Fusion X 2     JOINT REPLACEMENT Right 01/2016   Dr Gavin Potters   KNEE ARTHROPLASTY Left 02/10/2019   Procedure: COMPUTER ASSISTED  TOTAL KNEE ARTHROPLASTY - RNFA;  Surgeon: Donato Heinz, MD;  Location: [facility];  Service: Orthopedics;  Laterality: Left;   KNEE ARTHROSCOPY Bilateral    Right knee scope 1998, Left knee scope   REVERSE SHOULDER ARTHROPLASTY Right 03/12/2021   Procedure: Right reverse shoulder arthroplasty, biceps tenodesis;  Surgeon: Signa Kell, MD;  Location: [facility];  Service: Orthopedics;  Laterality: Right;   TONSILLECTOMY     TOTAL KNEE ARTHROPLASTY Right 02/01/2015   Procedure: TOTAL KNEE ARTHROPLASTY;  Surgeon: Erin Sons, MD;  Location: [facility];  Service:  Orthopedics;  Laterality: Right;   TUBAL LIGATION      Family Psychiatric History: Reviewed family psychiatric history from progress note on 09/16/2018  Family History:  Family History  Problem Relation Age of Onset   Breast cancer Mother 6   Colon cancer Father    Heart attack Father    Bipolar disorder Sister    Depression Sister    Schizophrenia Sister     Social History: Reviewed social history from progress note on 09/16/2018 Social History   Socioeconomic History   Marital status: Married    Spouse name: Not on file   Number of children: 1   Years of education: Not on file   Highest education level: High school graduate  Occupational History   Not on file  Tobacco Use   Smoking status: Every Day    Packs/day: 1.00    Years: 30.00    Pack years: 30.00    Types: Cigarettes    Last attempt to quit: 06/29/2020    Years since quitting: 0.8   Smokeless tobacco: Never   Tobacco comments:    States she restarted several months back and no interest in quitting at this time.   Vaping Use   Vaping Use: Never used  Substance and Sexual Activity   Alcohol use: No   Drug use: Yes    Types: Marijuana    Comment: every day   Sexual activity: Not Currently  Other Topics Concern   Not on file  Social History Narrative   Not on file   Social Determinants of Health   Financial Resource Strain: Not on file  Food Insecurity: Not on file  Transportation Needs: Not on file  Physical Activity: Not on file  Stress: Not on file  Social Connections: Not on file    Allergies: No Known Allergies  Metabolic Disorder Labs: Lab Results  Component Value Date   HGBA1C 6.2 (H) 09/26/2020   MPG 131.24 09/26/2020   Lab Results  Component Value Date   PROLACTIN 6.4 09/26/2020   Lab Results  Component Value Date   CHOL 164 09/26/2020   TRIG 99 09/26/2020   HDL 44 09/26/2020   CHOLHDL 3.7 09/26/2020   VLDL 20 09/26/2020   LDLCALC 100 (H) 09/26/2020   Lab Results   Component Value Date   TSH 0.696 09/26/2020    Therapeutic Level Labs: No results found for: LITHIUM No results found for: VALPROATE No components found for:  CBMZ  Current Medications: Current Outpatient Medications  Medication Sig Dispense Refill   acetaminophen (TYLENOL) 500 MG tablet Take 2 tablets (1,000 mg total) by mouth every 8 (eight) hours. 90 tablet 2   ADVAIR DISKUS 250-50 MCG/DOSE AEPB Inhale 1 puff into the lungs daily.     albuterol (VENTOLIN HFA) 108 (90 Base) MCG/ACT inhaler Inhale 2 puffs into the lungs 4 (four) times daily.      lidocaine (XYLOCAINE) 5 % ointment Apply  1 application topically as needed for moderate pain or mild pain.     ondansetron (ZOFRAN ODT) 4 MG disintegrating tablet Take 1 tablet (4 mg total) by mouth every 8 (eight) hours as needed for nausea or vomiting. 20 tablet 0   oxyCODONE (ROXICODONE) 5 MG immediate release tablet Take 1-2 tablets (5-10 mg total) by mouth every 4 (four) hours as needed (pain). 30 tablet 0   simvastatin (ZOCOR) 20 MG tablet Take 20 mg by mouth at bedtime.      Vibegron (GEMTESA) 75 MG TABS Take 75 mg by mouth daily.     ARIPiprazole (ABILIFY) 20 MG tablet Take 1 tablet (20 mg total) by mouth daily. 90 tablet 0   busPIRone (BUSPAR) 30 MG tablet Take 1 tablet (30 mg total) by mouth 2 (two) times daily. 180 tablet 0   gabapentin (NEURONTIN) 800 MG tablet Take 1 tablet (800 mg total) by mouth every 6 (six) hours. 120 tablet 5   HYDROcodone-acetaminophen (NORCO) 7.5-325 MG tablet Take 1 tablet by mouth 5 (five) times daily. (Patient not taking: Reported on 05/07/2021)     methocarbamol (ROBAXIN) 750 MG tablet Take 1 tablet (750 mg total) by mouth 3 (three) times daily. Max: 3/day 90 tablet 5   traZODone (DESYREL) 100 MG tablet Take 2 tablets (200 mg total) by mouth at bedtime as needed for sleep. 180 tablet 0   venlafaxine XR (EFFEXOR XR) 150 MG 24 hr capsule Take 1 capsule (150 mg total) by mouth See admin instructions. Take  with 37.5 mg daily 90 capsule 1   venlafaxine XR (EFFEXOR-XR) 37.5 MG 24 hr capsule Take 1 capsule (37.5 mg total) by mouth daily with breakfast. Start taking along with 150 mg 90 capsule 0   No current facility-administered medications for this visit.     Musculoskeletal: Strength & Muscle Tone: within normal limits Gait & Station:  walks with cane Patient leans: N/A  Psychiatric Specialty Exam: Review of Systems  Psychiatric/Behavioral:  Negative for agitation, behavioral problems, confusion, decreased concentration, dysphoric mood, hallucinations, self-injury, sleep disturbance and suicidal ideas. The patient is not nervous/anxious and is not hyperactive.   All other systems reviewed and are negative.  Blood pressure (!) 157/82, pulse 87, weight 234 lb 9.6 oz (106.4 kg).Body mass index is 37.87 kg/m.  General Appearance: Casual  Eye Contact:  Fair  Speech:  Clear and Coherent  Volume:  Normal  Mood:  Euthymic  Affect:  Congruent  Thought Process:  Goal Directed and Descriptions of Associations: Intact  Orientation:  Full (Time, Place, and Person)  Thought Content: Logical   Suicidal Thoughts:  No  Homicidal Thoughts:  No  Memory:  Immediate;   Fair Recent;   Fair Remote;   Fair  Judgement:  Fair  Insight:  Fair  Psychomotor Activity:  Normal  Concentration:  Concentration: Fair and Attention Span: Fair  Recall:  [employer] of Knowledge: Fair  Language: Fair  Akathisia:  No  Handed:  Right  AIMS (if indicated): done, 0  Assets:  Communication Skills Desire for Improvement Housing Social Support  ADL's:  Intact  Cognition: WNL  Sleep:  Fair   Screenings: AIMS    Flowsheet Row Office Visit from 11/27/2020 in [facility] Office Visit from 09/26/2020 in [facility]  AIMS Total Score 0 0      GAD-7    Flowsheet Row Office Visit from 11/27/2020 in [facility] Office Visit from  09/26/2020 in [facility]  Total GAD-7 Score 4 5      PHQ2-9    Flowsheet Row Office Visit from 05/07/2021 in [facility] Office Visit from 02/13/2021 in [facility] Office Visit from 11/27/2020 in [facility] Office Visit from 09/26/2020 in [facility] Office Visit from 11/30/2019 in [facility]  PHQ-2 Total Score 0 2 2 2  0  PHQ-9 Total Score -- 3 4 3  --      Flowsheet Row Pre-Admission Testing 58 from 03/05/2021 in [facility] Office Visit from 11/27/2020 in [facility] Office Visit from 09/26/2020 in [facility]  C-SSRS RISK CATEGORY No Risk No Risk No Risk        Assessment and Plan: Shelly Wright is a 50 year old Caucasian female on SSI, history of bipolar disorder, tobacco use disorder, COPD was evaluated in office today.  Patient is currently stable with regards to her mood however does have recent hyponatremia as well as elevated blood pressure reading in session today, will benefit from the following plan.  Plan Bipolar disorder in remission Abilify 20 mg p.o. daily Trazodone 200 mg p.o. nightly as needed Trileptal 600 mg p.o. twice daily Venlafaxine XR release 187.5 mg p.o. daily  GAD-stable Venlafaxine as prescribed BuSpar 30 mg p.o. twice daily Trileptal 600 mg p.o. twice daily Propranolol 10 mg p.o. 3 times daily as needed  Cannabis use disorder moderate-unstable Provided counseling  Tobacco use disorder-unstable Patient is not willing to cut back  Hyponatremia-most recent labs-03/05/2021-showed sodium level low at 131.  We will repeat sodium.  Patient provided lab slip.  Elevated blood pressure reading-unstable Repeat blood pressure continues to be elevated.  Initially 181/80,  repeat 157/82. We will coordinate care with primary care provider.  Patient advised to follow up with primary care for management.  Follow-up in clinic in 3 months or sooner in person.  This note was generated in part or whole with voice recognition software. Voice recognition is usually quite accurate but there are transcription errors that can and very often do occur. I apologize for any typographical errors that were not detected and corrected.      44, MD 05/08/2021, 8:30 AM

## 2021-05-07 NOTE — Telephone Encounter (Signed)
Attempted to contact patient multiple times to discuss sodium level being low.  Had to leave a voicemail.  Patient finally picked up, discussed with patient to hold the Trileptal due to sodium level being low.  Also advised to go to the nearest urgent care or contact primary care for management.  Discussed with patient all her other medications like her antidepressants could also have an impact on her sodium level.  And that she will need urgent management of the same.  She is currently asymptomatic.

## 2021-05-30 ENCOUNTER — Other Ambulatory Visit: Payer: Self-pay | Admitting: Psychiatry

## 2021-05-30 DIAGNOSIS — F3178 Bipolar disorder, in full remission, most recent episode mixed: Secondary | ICD-10-CM

## 2021-06-04 ENCOUNTER — Other Ambulatory Visit: Payer: Self-pay | Admitting: Orthopedic Surgery

## 2021-06-12 ENCOUNTER — Encounter
Admission: RE | Admit: 2021-06-12 | Discharge: 2021-06-12 | Disposition: A | Discharge status: Home / Self Care | Payer: Medicaid Other | Source: Ambulatory Visit | Attending: Orthopedic Surgery | Admitting: Orthopedic Surgery

## 2021-06-12 ENCOUNTER — Other Ambulatory Visit: Payer: Self-pay

## 2021-06-12 NOTE — Patient Instructions (Signed)
Your procedure is scheduled on: 06/18/21 Report to DAY SURGERY DEPARTMENT LOCATED ON 2ND FLOOR MEDICAL MALL ENTRANCE. To find out your arrival time please call [phone] between 1PM - 3PM on 06/15/21.  Remember: Instructions that are not followed completely Carda result in serious medical risk, up to and including death, or upon the discretion of your surgeon and anesthesiologist your surgery [place] need to be rescheduled.     _X__ 1. Do not eat food after midnight the night before your procedure.                 No gum chewing or hard candies. You Mcgreg[place] drink clear liquids up to 2 hours                 before you are scheduled to arrive for your surgery- DO not drink clear                 liquids within 2 hours of the start of your surgery.                 Clear Liquids include:  water, apple juice without pulp, clear carbohydrate                 drink such as Clearfast or Gatorade, Black Coffee or Tea (Do not add                 anything to coffee or tea). Diabetics water only  __X__2.  On the morning of surgery brush your teeth with toothpaste and water, you                 Cedrone rinse your mouth with mouthwash if you wish.  Do not swallow any              toothpaste of mouthwash.     _X__ 3.  No Alcohol for 24 hours before or after surgery.   _X__ 4.  Do Not Smoke or use e-cigarettes For 24 Hours Prior to Your Surgery.                 Do not use any chewable tobacco products for at least 6 hours prior to                 surgery.  ____  5.  Bring all medications with you on the day of surgery if instructed.   __X__  6.  Notify your doctor if there is any change in your medical condition      (cold, fever, infections).     Do not wear jewelry, make-up, hairpins, clips or nail polish. Do not wear lotions, powders, or perfumes. No Deodorant. Do not shave body hair 48 hours prior to surgery. Men Bernstein shave face and neck. Do not bring valuables to the hospital.    [facility] is not  responsible for any belongings or valuables.  Contacts, dentures/partials or body piercings [place] not be worn into surgery. Bring a case for your contacts, glasses or hearing aids, a denture cup will be supplied. Leave your suitcase in the car. After surgery it [place] be brought to your room. For patients admitted to the hospital, discharge time is determined by your treatment team.   Patients discharged the day of surgery will not be allowed to drive home.   Please read over the following fact sheets that you were given:   Incentive Spirometer  __X__ Take these medicines the morning of surgery with A SIP OF  WATER:    1. ARIPiprazole (ABILIFY) 20 MG tablet  2. busPIRone (BUSPAR) 30 MG tablet  3. gabapentin (NEURONTIN) 800 MG tablet  4. HYDROcodone-acetaminophen (NORCO) 7.5-325 MG tablet  5. methocarbamol (ROBAXIN) 750 MG tablet  6. venlafaxine XR (EFFEXOR XR) 187.5 MG 24 hr capsule  ____ Fleet Enema (as directed)   ____ Use CHG Soap/SAGE wipes as directed  [place] use DIAL "Orange bar" soap  ____ Use inhalers on the day of surgery  ____ Stop metformin/Janumet/Farxiga 2 days prior to surgery    ____ Take 1/2 of usual insulin dose the night before surgery. No insulin the morning          of surgery.   ____ Stop Blood Thinners Coumadin/Plavix/Xarelto/Pleta/Pradaxa/Eliquis/Effient/Aspirin  on   Or contact your Surgeon, Cardiologist or Medical Doctor regarding  ability to stop your blood thinners  __X__ Stop Anti-inflammatories 7 days before surgery such as Advil, Ibuprofen, Motrin,  BC or Goodies Powder, Naprosyn, Naproxen, Aleve, Aspirin    __X__ Stop all herbals and supplements, fish oil or vitamins  until after surgery.    ____ Bring C-Pap to the hospital.

## 2021-06-18 ENCOUNTER — Other Ambulatory Visit: Payer: Self-pay

## 2021-06-18 ENCOUNTER — Ambulatory Visit
Admission: RE | Admit: 2021-06-18 | Discharge: 2021-06-18 | Disposition: A | Discharge status: Home / Self Care | Payer: Medicaid Other | Attending: Orthopedic Surgery | Admitting: Orthopedic Surgery

## 2021-06-18 ENCOUNTER — Ambulatory Visit: Payer: Medicaid Other | Admitting: Anesthesiology

## 2021-06-18 ENCOUNTER — Ambulatory Visit: Payer: Medicaid Other

## 2021-06-18 ENCOUNTER — Encounter
Admission: RE | Disposition: A | Discharge status: Home / Self Care | Payer: Self-pay | Source: Home / Self Care | Attending: Orthopedic Surgery

## 2021-06-18 ENCOUNTER — Encounter: Payer: Self-pay | Admitting: Orthopedic Surgery

## 2021-06-18 DIAGNOSIS — J449 Chronic obstructive pulmonary disease, unspecified: Secondary | ICD-10-CM | POA: Insufficient documentation

## 2021-06-18 DIAGNOSIS — M797 Fibromyalgia: Secondary | ICD-10-CM | POA: Insufficient documentation

## 2021-06-18 DIAGNOSIS — Z419 Encounter for procedure for purposes other than remedying health state, unspecified: Secondary | ICD-10-CM

## 2021-06-18 DIAGNOSIS — F419 Anxiety disorder, unspecified: Secondary | ICD-10-CM | POA: Insufficient documentation

## 2021-06-18 DIAGNOSIS — M25812 Other specified joint disorders, left shoulder: Secondary | ICD-10-CM | POA: Diagnosis not present

## 2021-06-18 DIAGNOSIS — M65811 Other synovitis and tenosynovitis, right shoulder: Secondary | ICD-10-CM | POA: Diagnosis not present

## 2021-06-18 DIAGNOSIS — Z96653 Presence of artificial knee joint, bilateral: Secondary | ICD-10-CM | POA: Insufficient documentation

## 2021-06-18 DIAGNOSIS — F319 Bipolar disorder, unspecified: Secondary | ICD-10-CM | POA: Insufficient documentation

## 2021-06-18 DIAGNOSIS — Z96611 Presence of right artificial shoulder joint: Secondary | ICD-10-CM | POA: Insufficient documentation

## 2021-06-18 DIAGNOSIS — M19012 Primary osteoarthritis, left shoulder: Secondary | ICD-10-CM | POA: Diagnosis not present

## 2021-06-18 DIAGNOSIS — F17210 Nicotine dependence, cigarettes, uncomplicated: Secondary | ICD-10-CM | POA: Diagnosis not present

## 2021-06-18 DIAGNOSIS — S46212A Strain of muscle, fascia and tendon of other parts of biceps, left arm, initial encounter: Secondary | ICD-10-CM | POA: Diagnosis not present

## 2021-06-18 DIAGNOSIS — Z79891 Long term (current) use of opiate analgesic: Secondary | ICD-10-CM | POA: Diagnosis not present

## 2021-06-18 DIAGNOSIS — I10 Essential (primary) hypertension: Secondary | ICD-10-CM | POA: Insufficient documentation

## 2021-06-18 DIAGNOSIS — X58XXXA Exposure to other specified factors, initial encounter: Secondary | ICD-10-CM | POA: Insufficient documentation

## 2021-06-18 DIAGNOSIS — M75102 Unspecified rotator cuff tear or rupture of left shoulder, not specified as traumatic: Secondary | ICD-10-CM | POA: Insufficient documentation

## 2021-06-18 HISTORY — PX: SHOULDER ARTHROSCOPY WITH SUBACROMIAL DECOMPRESSION AND OPEN ROTATOR C: SHX5688

## 2021-06-18 SURGERY — SHOULDER ARTHROSCOPY WITH SUBACROMIAL DECOMPRESSION AND OPEN ROTATOR CUFF REPAIR, OPEN BICEPS TENDON REPAIR
Anesthesia: General | Site: Shoulder | Laterality: Left

## 2021-06-18 MED ORDER — CHLORHEXIDINE GLUCONATE 0.12 % MT SOLN
OROMUCOSAL | Status: AC
  Administered 2021-06-18: 15 mL via OROMUCOSAL
  Filled 2021-06-18: qty 15

## 2021-06-18 MED ORDER — FENTANYL CITRATE (PF) 100 MCG/2ML IJ SOLN
INTRAMUSCULAR | Status: AC
  Filled 2021-06-18: qty 2

## 2021-06-18 MED ORDER — ACETAMINOPHEN 10 MG/ML IV SOLN
INTRAVENOUS | Status: DC | PRN
  Administered 2021-06-18: 1000 mg via INTRAVENOUS

## 2021-06-18 MED ORDER — CEFAZOLIN SODIUM-DEXTROSE 2-4 GM/100ML-% IV SOLN
INTRAVENOUS | Status: AC
  Filled 2021-06-18: qty 100

## 2021-06-18 MED ORDER — LIDOCAINE HCL (CARDIAC) PF 100 MG/5ML IV SOSY
PREFILLED_SYRINGE | INTRAVENOUS | Status: DC | PRN
  Administered 2021-06-18: 20 mg via INTRAVENOUS

## 2021-06-18 MED ORDER — PHENYLEPHRINE HCL (PRESSORS) 10 MG/ML IV SOLN
INTRAVENOUS | Status: DC | PRN
Start: 2021-06-18 — End: 2021-06-18
  Administered 2021-06-18 (×3): 100 ug via INTRAVENOUS

## 2021-06-18 MED ORDER — LACTATED RINGERS IR SOLN
Status: DC | PRN
  Administered 2021-06-18: 33000 mL

## 2021-06-18 MED ORDER — MIDAZOLAM HCL 2 MG/2ML IJ SOLN: 1.0000 mg | INTRAMUSCULAR | Status: DC | PRN

## 2021-06-18 MED ORDER — LACTATED RINGERS IV SOLN
INTRAVENOUS | Status: DC
Start: 2021-06-18 — End: 2021-06-18

## 2021-06-18 MED ORDER — OXYCODONE HCL 5 MG PO TABS
ORAL_TABLET | ORAL | Status: AC
  Filled 2021-06-18: qty 1

## 2021-06-18 MED ORDER — FAMOTIDINE 20 MG PO TABS
ORAL_TABLET | ORAL | Status: AC
  Administered 2021-06-18: 20 mg via ORAL
  Filled 2021-06-18: qty 1

## 2021-06-18 MED ORDER — ORAL CARE MOUTH RINSE: 15.0000 mL | Freq: Once | OROMUCOSAL | Status: AC

## 2021-06-18 MED ORDER — OXYCODONE HCL 5 MG/5ML PO SOLN: 5.0000 mg | Freq: Once | ORAL | Status: AC | PRN

## 2021-06-18 MED ORDER — DEXAMETHASONE SODIUM PHOSPHATE 10 MG/ML IJ SOLN
INTRAMUSCULAR | Status: DC | PRN
  Administered 2021-06-18: 8 mg via INTRAVENOUS

## 2021-06-18 MED ORDER — CEFAZOLIN SODIUM-DEXTROSE 2-4 GM/100ML-% IV SOLN
2.0000 g | INTRAVENOUS | Status: AC
  Administered 2021-06-18: 2 g via INTRAVENOUS

## 2021-06-18 MED ORDER — GLYCOPYRROLATE 0.2 MG/ML IJ SOLN
INTRAMUSCULAR | Status: DC | PRN
  Administered 2021-06-18: .2 mg via INTRAVENOUS

## 2021-06-18 MED ORDER — LACTATED RINGERS IV SOLN
INTRAVENOUS | Status: DC | PRN
  Administered 2021-06-18: 12004 mL

## 2021-06-18 MED ORDER — DEXMEDETOMIDINE (PRECEDEX) IN NS 20 MCG/5ML (4 MCG/ML) IV SYRINGE
PREFILLED_SYRINGE | INTRAVENOUS | Status: DC | PRN
  Administered 2021-06-18: 12 ug via INTRAVENOUS
  Administered 2021-06-18: 8 ug via INTRAVENOUS

## 2021-06-18 MED ORDER — FENTANYL CITRATE (PF) 100 MCG/2ML IJ SOLN
INTRAMUSCULAR | Status: DC | PRN
  Administered 2021-06-18 (×4): 50 ug via INTRAVENOUS

## 2021-06-18 MED ORDER — ACETAMINOPHEN 500 MG PO TABS
1000.0000 mg | ORAL_TABLET | Freq: Three times a day (TID) | ORAL | 2 refills | Status: AC
Start: 2021-06-18 — End: 2022-06-18

## 2021-06-18 MED ORDER — OXYCODONE HCL 5 MG PO TABS
5.0000 mg | ORAL_TABLET | Freq: Once | ORAL | Status: AC | PRN
  Administered 2021-06-18: 5 mg via ORAL

## 2021-06-18 MED ORDER — FAMOTIDINE 20 MG PO TABS
20.0000 mg | ORAL_TABLET | Freq: Once | ORAL | Status: AC
Start: 2021-06-18 — End: 2021-06-18

## 2021-06-18 MED ORDER — BUPIVACAINE LIPOSOME 1.3 % IJ SUSP
INTRAMUSCULAR | Status: AC
  Filled 2021-06-18: qty 20

## 2021-06-18 MED ORDER — FENTANYL CITRATE PF 50 MCG/ML IJ SOSY
PREFILLED_SYRINGE | INTRAMUSCULAR | Status: AC
  Administered 2021-06-18: 50 ug via INTRAVENOUS
  Filled 2021-06-18: qty 1

## 2021-06-18 MED ORDER — ACETAMINOPHEN 10 MG/ML IV SOLN
INTRAVENOUS | Status: AC
  Filled 2021-06-18: qty 100

## 2021-06-18 MED ORDER — FENTANYL CITRATE (PF) 100 MCG/2ML IJ SOLN: 25.0000 ug | INTRAMUSCULAR | Status: DC | PRN

## 2021-06-18 MED ORDER — PROPOFOL 10 MG/ML IV BOLUS
INTRAVENOUS | Status: DC | PRN
  Administered 2021-06-18: 40 mg via INTRAVENOUS
  Administered 2021-06-18: 130 mg via INTRAVENOUS

## 2021-06-18 MED ORDER — EPINEPHRINE PF 1 MG/ML IJ SOLN
INTRAMUSCULAR | Status: AC
  Filled 2021-06-18: qty 4

## 2021-06-18 MED ORDER — SEVOFLURANE IN SOLN
RESPIRATORY_TRACT | Status: AC
  Filled 2021-06-18: qty 250

## 2021-06-18 MED ORDER — ALBUTEROL SULFATE HFA 108 (90 BASE) MCG/ACT IN AERS
INHALATION_SPRAY | RESPIRATORY_TRACT | Status: DC | PRN
Start: 2021-06-18 — End: 2021-06-18
  Administered 2021-06-18 (×2): 4 via RESPIRATORY_TRACT

## 2021-06-18 MED ORDER — LIDOCAINE HCL (PF) 1 % IJ SOLN
INTRAMUSCULAR | Status: AC
  Filled 2021-06-18: qty 5

## 2021-06-18 MED ORDER — ROCURONIUM BROMIDE 100 MG/10ML IV SOLN
INTRAVENOUS | Status: DC | PRN
  Administered 2021-06-18: 50 mg via INTRAVENOUS
  Administered 2021-06-18: 20 mg via INTRAVENOUS
  Administered 2021-06-18 (×3): 10 mg via INTRAVENOUS

## 2021-06-18 MED ORDER — ASPIRIN EC 325 MG PO TBEC: 325.0000 mg | DELAYED_RELEASE_TABLET | Freq: Every day | ORAL | 0 refills | Status: AC

## 2021-06-18 MED ORDER — BUPIVACAINE LIPOSOME 1.3 % IJ SUSP
INTRAMUSCULAR | Status: DC | PRN
  Administered 2021-06-18: 7 mL via PERINEURAL
  Administered 2021-06-18: 13 mL via PERINEURAL

## 2021-06-18 MED ORDER — SUGAMMADEX SODIUM 200 MG/2ML IV SOLN
INTRAVENOUS | Status: DC | PRN
  Administered 2021-06-18: 200 mg via INTRAVENOUS

## 2021-06-18 MED ORDER — FENTANYL CITRATE PF 50 MCG/ML IJ SOSY: 50.0000 ug | PREFILLED_SYRINGE | Freq: Once | INTRAMUSCULAR | Status: AC

## 2021-06-18 MED ORDER — ONDANSETRON HCL 4 MG/2ML IJ SOLN
INTRAMUSCULAR | Status: DC | PRN
  Administered 2021-06-18: 4 mg via INTRAVENOUS

## 2021-06-18 MED ORDER — MIDAZOLAM HCL 2 MG/2ML IJ SOLN
INTRAMUSCULAR | Status: AC
  Administered 2021-06-18: 1 mg via INTRAVENOUS
  Filled 2021-06-18: qty 2

## 2021-06-18 MED ORDER — OXYCODONE HCL 5 MG PO TABS: 5.0000 mg | ORAL_TABLET | ORAL | 0 refills | Status: DC | PRN

## 2021-06-18 MED ORDER — BUPIVACAINE HCL (PF) 0.5 % IJ SOLN
INTRAMUSCULAR | Status: DC | PRN
  Administered 2021-06-18: 7 mL via PERINEURAL
  Administered 2021-06-18: 3 mL via PERINEURAL

## 2021-06-18 MED ORDER — BUPIVACAINE HCL (PF) 0.5 % IJ SOLN
INTRAMUSCULAR | Status: AC
  Filled 2021-06-18: qty 10

## 2021-06-18 MED ORDER — 0.9 % SODIUM CHLORIDE (POUR BTL) OPTIME
TOPICAL | Status: DC | PRN
  Administered 2021-06-18: 500 mL

## 2021-06-18 MED ORDER — ONDANSETRON 4 MG PO TBDP: 4.0000 mg | ORAL_TABLET | Freq: Three times a day (TID) | ORAL | 0 refills | Status: DC | PRN

## 2021-06-18 MED ORDER — CHLORHEXIDINE GLUCONATE 0.12 % MT SOLN: 15.0000 mL | Freq: Once | OROMUCOSAL | Status: AC

## 2021-06-18 SURGICAL SUPPLY — 72 items
ADH SKN CLS APL DERMABOND .7 (GAUZE/BANDAGES/DRESSINGS) ×1
ANCH SUT 1.4 SUT TPE BLK/WHT (SUTURE) ×2
ANCH SUT 2 SWLK 19.1 CLS EYLT (Anchor) ×4 IMPLANT
ANCH SUT 2X2.3 TAPE (Anchor) ×3 IMPLANT
ANCH SUT SWLK 19.1X4.75 (Anchor) ×1 IMPLANT
ANCHOR 2.3 SP SGL 1.2 XBRAID (Anchor) ×3 IMPLANT
ANCHOR SUT BIO SW 4.75X19.1 (Anchor) ×1 IMPLANT
ANCHOR SWIVELOCK BIO 4.75X19.1 (Anchor) ×5 IMPLANT
APL PRP STRL LF DISP 70% ISPRP (MISCELLANEOUS) ×2
BLADE SHAVER 4.5X7 STR FR (MISCELLANEOUS) ×2 IMPLANT
BNDG ADH 2 X3.75 FABRIC TAN LF (GAUZE/BANDAGES/DRESSINGS) ×2 IMPLANT
BNDG ADH XL 3.75X2 STRCH LF (GAUZE/BANDAGES/DRESSINGS) ×1
CANNULA TWIST IN 8.25X7CM (CANNULA) ×1 IMPLANT
CHLORAPREP W/TINT 26 (MISCELLANEOUS) ×3 IMPLANT
DERMABOND ADVANCED (GAUZE/BANDAGES/DRESSINGS) ×1
DERMABOND ADVANCED .7 DNX12 (GAUZE/BANDAGES/DRESSINGS) IMPLANT
DRAPE 3/4 80X56 (DRAPES) ×2 IMPLANT
DRAPE IMP U-DRAPE 54X76 (DRAPES) ×4 IMPLANT
DRAPE INCISE IOBAN 66X45 STRL (DRAPES) ×2 IMPLANT
DRAPE U-SHAPE 47X51 STRL (DRAPES) ×4 IMPLANT
DRSG TEGADERM 4X4.75 (GAUZE/BANDAGES/DRESSINGS) ×6 IMPLANT
ELECT REM PT RETURN 9FT ADLT (ELECTROSURGICAL) ×2
ELECTRODE REM PT RTRN 9FT ADLT (ELECTROSURGICAL) ×1 IMPLANT
GAUZE SPONGE 4X4 12PLY STRL (GAUZE/BANDAGES/DRESSINGS) ×2 IMPLANT
GAUZE XEROFORM 1X8 LF (GAUZE/BANDAGES/DRESSINGS) ×2 IMPLANT
GLOVE SRG 8 PF TXTR STRL LF DI (GLOVE) ×2 IMPLANT
GLOVE SURG ENC MOIS LTX SZ7.5 (GLOVE) ×2 IMPLANT
GLOVE SURG ORTHO LTX SZ8 (GLOVE) ×2 IMPLANT
GLOVE SURG SYN 8.0 (GLOVE) ×2 IMPLANT
GLOVE SURG SYN 8.0 PF PI (GLOVE) ×1 IMPLANT
GLOVE SURG UNDER POLY LF SZ8 (GLOVE) ×4
GOWN STRL REUS W/ TWL LRG LVL3 (GOWN DISPOSABLE) ×2 IMPLANT
GOWN STRL REUS W/TWL LRG LVL3 (GOWN DISPOSABLE) ×4
GOWN STRL REUS W/TWL XL LVL4 (GOWN DISPOSABLE) ×2 IMPLANT
IV LACTATED RINGER IRRG 3000ML (IV SOLUTION) ×22
IV LR IRRIG 3000ML ARTHROMATIC (IV SOLUTION) ×4 IMPLANT
KIT STABILIZATION SHOULDER (MISCELLANEOUS) ×2 IMPLANT
KIT TURNOVER KIT A (KITS) ×2 IMPLANT
MANIFOLD NEPTUNE II (INSTRUMENTS) ×4 IMPLANT
MASK FACE SPIDER DISP (MASK) ×2 IMPLANT
MAT ABSORB  FLUID 56X50 GRAY (MISCELLANEOUS) ×2
MAT ABSORB FLUID 56X50 GRAY (MISCELLANEOUS) ×2 IMPLANT
NDL SAFETY ECLIPSE 18X1.5 (NEEDLE) ×1 IMPLANT
NEEDLE HYPO 18GX1.5 SHARP (NEEDLE) ×2
PACK ARTHROSCOPY SHOULDER (MISCELLANEOUS) ×2 IMPLANT
PAD ARMBOARD 7.5X6 YLW CONV (MISCELLANEOUS) ×2 IMPLANT
PAD WRAPON POLAR SHDR XLG (MISCELLANEOUS) ×1 IMPLANT
PASSER SUT FIRSTPASS SELF (INSTRUMENTS) ×2 IMPLANT
PASSER SUT SWIFTSTITCH HIP CRT (INSTRUMENTS) ×2 IMPLANT
PENCIL SMOKE EVACUATOR (MISCELLANEOUS) ×2 IMPLANT
SHAVER BLADE BONE CUTTER  5.5 (BLADE) ×1
SHAVER BLADE BONE CUTTER 5.5 (BLADE) ×1 IMPLANT
SLING ULTRA II M (MISCELLANEOUS) ×2 IMPLANT
SPONGE T-LAP 18X18 ~~LOC~~+RFID (SPONGE) ×2 IMPLANT
STRAP SAFETY 5IN WIDE (MISCELLANEOUS) ×2 IMPLANT
SUT ETHILON 3-0 (SUTURE) ×2 IMPLANT
SUT FIBERWIRE #2 38 BLUE 1/2 (SUTURE) ×2
SUT MNCRL 4-0 (SUTURE) ×2
SUT MNCRL 4-0 27XMFL (SUTURE) ×1
SUT VIC AB 0 CT1 36 (SUTURE) ×1 IMPLANT
SUT VIC AB 2-0 CT2 27 (SUTURE) ×1 IMPLANT
SUT XBRAID 1.4 BLK/WHT (SUTURE) ×2 IMPLANT
SUTURE FIBERWR #2 38 BLUE 1/2 (SUTURE) IMPLANT
SUTURE MNCRL 4-0 27XMF (SUTURE) IMPLANT
SUTURE TAPE 1.3 40 TPR END (SUTURE) IMPLANT
SUTURETAPE 1.3 40 TPR END (SUTURE) ×4
SYSTEM FBRTK BICEPS 1.9 DRILL (Anchor) ×2 IMPLANT
TUBING CONNECTING 10 (TUBING) ×1 IMPLANT
TUBING INFLOW SET DBFLO PUMP (TUBING) ×2 IMPLANT
TUBING OUTFLOW SET DBLFO PUMP (TUBING) ×2 IMPLANT
WAND WEREWOLF FLOW 90D (MISCELLANEOUS) ×2 IMPLANT
WRAPON POLAR PAD SHDR XLG (MISCELLANEOUS) ×2

## 2021-06-18 NOTE — Op Note (Signed)
SURGERY DATE:  06/18/2021   PRE-OP DIAGNOSIS:  1. Left rotator cuff tear (subscapularis, supraspinatus, infraspinatus) 2. Left subacromial impingement 3. Left biceps tendinopathy 4. Left acromioclavicular joint arthritis   POST-OP DIAGNOSIS: 1. Left rotator cuff tear (subscapularis, supraspinatus, infraspinatus) 2. Left subacromial impingement 3. Left biceps tendinopathy 4. Left acromioclavicular joint arthritis   PROCEDURES:  1. Left arthroscopic rotator cuff repair (subscapularis) 2. Left mini-open rotator cuff repair (supraspinatus and infraspinatus) 3. Left open biceps tenodesis 4. Left arthroscopic extensive debridement of shoulder (glenohumeral and subacromial spaces) 5. Left arthroscopic subacromial decompression 6. Left arthroscopic distal clavicle excision   SURGEON: Cato Mulligan, MD   ASSISTANT: Anitra Lauth, PA   ANESTHESIA: Gen with Exparel interscalene block   ESTIMATED BLOOD LOSS: 50cc   DRAINS:  none   TOTAL IV FLUIDS: per anesthesia      SPECIMENS: none   IMPLANTS: - Arthrex 4.59mm SwiveLock x 5 - Arthrex FiberTak Suture Anchor - Double Loaded - Iconix SPEED double loaded all suture anchor with 1.2 & 2.66mm tape     OPERATIVE FINDINGS:  Examination under anesthesia: A careful examination under anesthesia was performed.  Passive range of motion was: FF: 150; ER at side: 80; ER in abduction: 100; IR in abduction: 50.  Anterior load shift: NT.  Posterior load shift: NT.  Sulcus in neutral: NT.  Sulcus in ER: NT.     Intra-operative findings: A thorough arthroscopic examination of the shoulder was performed.  The findings are: 1. Biceps tendon: significant tendinopathy and split thickness tearing 2. Superior labrum: injected with surrounding synovitis 3. Posterior labrum and capsule: normal 4. Inferior capsule and inferior recess: normal 5. Glenoid cartilage surface: Normal 6. Supraspinatus attachment: full-thickness tear at the musculotendinous  junction anteriorly and along the lateral edge of the footprint posteriorly.  Tear extends to involve the anterior infraspinatus 7. Posterior rotator cuff attachment: Full-thickness tear of the anterior infraspinatus 8. Humeral head articular cartilage: normal 9. Rotator interval: significant synovitis 10: Subscapularis tendon: Full-thickness, retracted tear of the entire subscapularis 11. Anterior labrum: degenerative 12. IGHL: normal   OPERATIVE REPORT:    Indications for procedure:  Shelly Wright is a 51 y.o. female with over 1 year of significant left shoulder pain that had significantly worsened over the past 3 months.  She has had difficulty lifting his arm overhead.  She has failed extensive nonoperative management including physical therapy, medications, and activity modifications. Clinical exam and MRI were suggestive of rotator cuff tear including subscapularis, supraspinatus, and infraspinatus tears; subacromial impingement; acromioclavicular joint arthritis and biceps tendinopathy. Given these findings, we decided to proceed with surgical management. After discussion of risks, benefits, and alternatives to surgery, the patient elected to proceed.    Procedure in detail:   I identified Shelly Wright  in the pre-operative holding area.  I marked the operative shoulder with my initials. I reviewed the risks and benefits of the proposed surgical intervention, and the patient (and/or patient's guardian) wished to proceed.  Anesthesia was then performed with an Exparel interscalene block.  The patient was transferred to the operative suite and placed in the beach chair position.     SCDs were placed on the lower extremities. Appropriate IV antibiotics were administered prior to incision. The operative upper extremity was then prepped and draped in standard fashion. A time out was performed confirming the correct extremity, correct patient, and correct procedure.    I then created a standard  posterior portal with an 11 blade. The glenohumeral  joint was easily entered with a blunt trocar and the arthroscope introduced. The findings of diagnostic arthroscopy are described above.  A standard anterior portal was made.  I debrided degenerative tissue including the synovitic tissue about the rotator interval as well as the anterior and superior labrum. I then coagulated the inflamed synovium to obtain hemostasis and reduce the risk of post-operative swelling using an Arthrocare radiofrequency device. The biceps tendon was cut at its insertion on the superior labrum with arthroscopic scissors.  An ArthroCare wand was used to debride the proximal biceps tendon stump at the biceps anchor complex.    The subscapularis tear was identified.  A superior anterolateral portal was made under needle localization.  A 7 mm cannula was placed.  The, comma tissue indicating the superolateral border of the subscapularis was identified readily.  The tip of the coracoid as well as the conjoined tendon and coracoacromial ligaments were visualized after debriding rotator interval tissue.  Tissue about the subscapularis was released anteriorly, superiorly, and posteriorly to allow for improved mobilization.  The comma tissue was tagged with a FiberWire suture and tension on this allowed for appropriate mobilization of the subscapularis.  The lesser tuberosity footprint was prepared with a combination of electrocautery and burr. 2 suture tapes were passed in a mattress fashion.  The traction stitch was removed.  Inferior two strands of suture were then loaded onto a 4.75 mm SwiveLock anchor and placed inferiorly into the prepared footprint with the arm in a neutral position.  Similarly, this was performed for the two superior suture tapes as these were placed superiorly in the prepared footprint using another 4.69mm SwiveLock anchor. The knotless mechanism from the superior anchor was utilized to further reduce the upper border  of the subscapularis. This construct appropriately reduced the subscapularis tear.  The arm was then internally and externally rotated and the subscapularis was noted to move appropriately with rotation.  The remainder of the suture was then cut.   Next, the arthroscope was then introduced into the subacromial space.  An extensive subacromial bursectomy was performed using a combination of the shaver and Arthrocare wand. The entire acromial undersurface was exposed and the CA ligament was subperiosteally elevated to expose the anterior acromial hook. A 5.34mm barrel burr was used to create a flat anterior and lateral aspect of the acromion, converting it from a Type 2 to a Type 1 acromion. Care was made to keep the deltoid fascia intact.   I then turned my attention to the arthroscopic distal clavicle excision. I identified the acromioclavicular joint. Surrounding bursal tissue was debrided and the edges of the joint were identified. I used the 5.38mm barrel burr to remove the distal clavicle parallel to the edge of the acromion. I was able to fit two widths of the burr into the space between the distal clavicle and acromion, signifying that I had removed ~60mm of distal clavicle. This was confirmed by viewing anteriorly and introducing a probe with measuring marks from the lateral portal. Hemostasis was achieved with an Arthrocare wand. Fluid was evacuated from the shoulder.    A longitudinal incision from the anterolateral acromion ~6cm in length was made overlying the raphe between the anterior and middle heads of the deltoid.  This incision also incorporated the anterolateral portal.  The raphe was identified and it was incised. The subacromial space was identified. Any remaining bursa was excised. The rotator cuff tear involving the supraspinatus and the anterior infraspinatus was identified. It was a tear at  the musculotendinous junction anteriorly with remnant lateral anterior supraspinatus stump covering  the footprint. At the level of the posterior supraspinatus, this tear reached the lateral aspect of the greater tuberosity and extending to the infraspinatus.    We then turned our attention to the biceps tenodesis. The arm was externally rotated.  The bicipital groove was identified.  A 15 blade was used to make a cut overlying the biceps tendon, and the tendon was removed using a right angle clamp.  The base of the bicipital groove was identified and cleared of soft tissue.  A FiberTak anchor was placed in the bicipital groove.  The biceps tendon was held at the appropriate amount of tension.  One set of sutures was passed through the biceps anchor with one limb passed in a simple fashion and the second limb passed in a simple plus locking stitch pattern.  This construct allowed for shuttling the biceps tendon down to the bone.  The sutures were tied and cut.  The diseased portion of the proximal biceps was then excised.   The arm was then internally rotated.  The remaining supraspinatus and infraspinatus footprint was cleared of soft tissue given the poor quality tendon there. The footprint was smoothed and a rongeur was used to create a smooth, bleeding bed.  The medial rotator cuff was appropriately mobilized.  Using a rotator cuff grasper, I could reduce the medial rotator cuff to cover the entirety of the supraspinatus and infraspinatus footprint and allow for a side-to-side repair of the anterior supraspinatus at the musculotendinous junction, where there was good quality remnant tendon. A 4.75 mm SwiveLock anchors loaded with two suture tapes was placed just lateral to the articular margin anteriorly. The FiberWire sutures from this anchor was passed in a side-to-side fashion, 1 strand in the lateral tendon stump, and the other strand in the medial tendon.  Two Iconix SPEED double loaded anchors were placed posteriorly and centrally at the articular margin for further medial row sutures. All remaining  12 strands of medial row suture were passed through the torn rotator cuff. With the arm in slight abduction and holding the rotator cuff in a reduced position, side-to-side anterior FiberWire suture was tied, reducing the medial and lateral ends of the cuff anteriorly at the musculotendinous junction. Two SwiveLock anchors were placed for the lateral row anchors with one limb of each of the remaining medial row sutures passed through each anchor. The knotless mechanism from the posterior anchor was utilized to reduce a dogear posteriorly. This allowed for excellent reapproximation and compression of the rotator cuff over its footprint. The construct was stable with external and internal rotation.   The wound was thoroughly irrigated.  The deltoid split was closed with 0 Vicryl.  The subdermal layer was closed with 2-0 Vicryl.  The skin was closed with 4-0 Monocryl and Dermabond. The portals were closed with 3-0 Nylon. Xeroform was applied to the incisions. A sterile dressing was applied, followed by a Polar Care sleeve and a SlingShot shoulder immobilizer/sling. The patient was awakened from anesthesia without difficulty and was transferred to the PACU in stable condition.    Of note, assistance from a PA was essential to performing the surgery.  PA was present for the entire surgery.  PA assisted with patient positioning, retraction, instrumentation, and wound closure. The surgery would have been more difficult and had longer operative time without PA assistance.    COMPLICATIONS: none   DISPOSITION: plan for discharge home after recovery in PACU  POSTOPERATIVE PLAN: Remain in sling (except hygiene and elbow/wrist/hand RoM exercises as instructed by PT) x 6 weeks and NWB for this time. PT to begin 3-4 days after surgery.  Use large rotator cuff repair rehab protocol with subscapularis repair restrictions (no ER past neutral x 3 weeks and no ER past 30 degrees x 6 weeks).  ASA 325mg  daily x 2 weeks for  DVT ppx.

## 2021-06-18 NOTE — Transfer of Care (Signed)
Immediate Anesthesia Transfer of Care Note  Patient: Shelly Wright  Procedure(s) Performed: Left shoulder arthroscopy, Open rotator cuff repair, subacromial decompression, distal clavicle excision and, open biceps tenodesis (Left: Shoulder)  Patient Location: PACU  Anesthesia Type:General  Level of Consciousness: drowsy  Airway & Oxygen Therapy: Patient Spontanous Breathing and Patient connected to face mask oxygen  Post-op Assessment: Report given to RN  Post vital signs: stable  Last Vitals:  Vitals Value Taken Time  BP 141/93 06/18/21 1439  Temp    Pulse 102 06/18/21 1441  Resp 22 06/18/21 1441  SpO2 97 % 06/18/21 1441  Vitals shown include unvalidated device data.  Last Pain:  Vitals:   06/18/21 0847  TempSrc: Temporal  PainSc: 7          Complications: No notable events documented.

## 2021-06-18 NOTE — Anesthesia Procedure Notes (Signed)
Procedure Name: Intubation Date/Time: 06/18/2021 10:52 AM Performed by: Lerry Liner, CRNA Pre-anesthesia Checklist: Patient identified, Emergency Drugs available, Suction available and Patient being monitored Patient Re-evaluated:Patient Re-evaluated prior to induction Oxygen Delivery Method: Circle system utilized Preoxygenation: Pre-oxygenation with 100% oxygen Induction Type: IV induction Ventilation: Mask ventilation without difficulty Laryngoscope Size: McGraph and 3 Grade View: Grade I Tube type: Oral Tube size: 7.0 mm Number of attempts: 1 Airway Equipment and Method: Stylet and Oral airway Placement Confirmation: ETT inserted through vocal cords under direct vision, positive ETCO2 and breath sounds checked- equal and bilateral Secured at: 22 cm Tube secured with: Tape Dental Injury: Teeth and Oropharynx as per pre-operative assessment

## 2021-06-18 NOTE — H&P (Signed)
Paper H&P to be scanned into permanent record. H&P reviewed. No significant changes noted.  

## 2021-06-18 NOTE — Anesthesia Postprocedure Evaluation (Signed)
Anesthesia Post Note  Patient: Shelly Wright  Procedure(s) Performed: Left shoulder arthroscopy, Open rotator cuff repair, subacromial decompression, distal clavicle excision and, open biceps tenodesis (Left: Shoulder)  Patient location during evaluation: PACU Anesthesia Type: General Level of consciousness: awake and alert Pain management: pain level controlled Vital Signs Assessment: post-procedure vital signs reviewed and stable Respiratory status: spontaneous breathing, nonlabored ventilation, respiratory function stable and patient connected to nasal cannula oxygen Cardiovascular status: blood pressure returned to baseline and stable Postop Assessment: no apparent nausea or vomiting Anesthetic complications: no   No notable events documented.   Last Vitals:  Vitals:   06/18/21 1513 06/18/21 1526  BP: 125/78 (!) 144/72  Pulse: 81 87  Resp: 12 18  Temp: 36.5 C (!) 36.1 C  SpO2: 92% 95%    Last Pain:  Vitals:   06/18/21 1526  TempSrc: Temporal  PainSc: [place]

## 2021-06-18 NOTE — Anesthesia Procedure Notes (Signed)
Anesthesia Regional Block: Interscalene brachial plexus block   Pre-Anesthetic Checklist: , timeout performed,  Correct Patient, Correct Site, Correct Laterality,  Correct Procedure, Correct Position, site marked,  Risks and benefits discussed,  Surgical consent,  Pre-op evaluation,  At surgeon's request and post-op pain management  Laterality: Upper and Left  Prep: chloraprep       Needles:  Injection technique: Single-shot  Needle Type: Stimiplex     Needle Length: 9cm  Needle Gauge: 22     Additional Needles:   Procedures:,,,, ultrasound used (permanent image in chart),,    Narrative:  Start time: 06/18/2021 9:45 AM End time: 06/18/2021 9:47 AM Injection made incrementally with aspirations every 5 mL.  Performed by: Personally  Anesthesiologist: Mikail Goostree, Cleda Mccreedy, MD  Additional Notes: Patient consented for risk and benefits of nerve block including but not limited to nerve damage, failed block, bleeding and infection.  Patient voiced understanding.  Functioning IV was confirmed and monitors were applied.  Timeout done prior to procedure and prior to any sedation being given to the patient.  Patient confirmed procedure site prior to any sedation given to the patient.  A 30mm 22ga Stimuplex needle was used. Sterile prep,hand hygiene and sterile gloves were used.  Minimal sedation used for procedure.  No paresthesia endorsed by patient during the procedure.  Negative aspiration and negative test dose prior to incremental administration of local anesthetic. The patient tolerated the procedure well with no immediate complications.

## 2021-06-18 NOTE — Anesthesia Preprocedure Evaluation (Signed)
Anesthesia Evaluation  Patient identified by MRN, date of birth, ID band Patient awake    Reviewed: Allergy & Precautions, NPO status , Patient's Chart, lab work & pertinent test results  History of Anesthesia Complications Negative for: history of anesthetic complications  Airway Mallampati: III  TM Distance: >3 FB Neck ROM: full    Dental  (+) Chipped, Poor Dentition, Missing, Edentulous Upper   Pulmonary asthma , COPD, Current Smoker,    Pulmonary exam normal        Cardiovascular Exercise Tolerance: Good hypertension, (-) anginaNormal cardiovascular exam+ dysrhythmias      Neuro/Psych PSYCHIATRIC DISORDERS  Neuromuscular disease    GI/Hepatic negative GI ROS, Neg liver ROS,   Endo/Other  negative endocrine ROS  Renal/GU      Musculoskeletal  (+) Arthritis , Fibromyalgia -  Abdominal   Peds  Hematology negative hematology ROS (+)   Anesthesia Other Findings Past Medical History: 08/30/2014: Acute right-sided low back pain with right-sided sciatica No date: Anxiety No date: Arthritis No date: Asthma     Comment:  in past, no current inhalers No date: Bipolar disorder (HCC) 05/07/2012: Cervical spinal cord compression (HCC) No date: COPD (chronic obstructive pulmonary disease) (HCC) No date: DDD (degenerative disc disease), lumbar No date: Depression No date: Dyspnea     Comment:  with exertion No date: Dysrhythmia     Comment:  irregular-h/o in the past No date: Fibromyalgia No date: Hypertension 05/23/2014: Neuritis or radiculitis due to rupture of lumbar  intervertebral disc 07/31/2015: Periprosthetic fracture around prosthetic knee No date: Restless leg syndrome 01/24/2012: Soft tissue lesion of shoulder region 09/18/2015: Status post revision of total replacement of right knee 02/01/2015: Status post right knee replacement 04/08/2014: Syncope and collapse 05/07/2012: Thoracic and lumbosacral  neuritis 05/07/2012: Thoracic neuritis  Past Surgical History: No date: ABDOMINAL HYSTERECTOMY No date: Cervical Fusion X 2 01/2016: JOINT REPLACEMENT; Right     Comment:  Dr Gavin Potters 02/10/2019: KNEE ARTHROPLASTY; Left     Comment:  Procedure: COMPUTER ASSISTED TOTAL KNEE ARTHROPLASTY -               RNFA;  Surgeon: Donato Heinz, MD;  Location: [facility] ORS;              Service: Orthopedics;  Laterality: Left; No date: KNEE ARTHROSCOPY; Bilateral     Comment:  Right knee scope 1998, Left knee scope 03/12/2021: REVERSE SHOULDER ARTHROPLASTY; Right     Comment:  Procedure: Right reverse shoulder arthroplasty, biceps               tenodesis;  Surgeon: Signa Kell, MD;  Location: [facility]               ORS;  Service: Orthopedics;  Laterality: Right; No date: TONSILLECTOMY 02/01/2015: TOTAL KNEE ARTHROPLASTY; Right     Comment:  Procedure: TOTAL KNEE ARTHROPLASTY;  Surgeon: Erin Sons, MD;  Location: [facility] ORS;  Service: Orthopedics;              Laterality: Right; No date: TUBAL LIGATION  BMI    Body Mass Index: 37.12 kg/m      Reproductive/Obstetrics negative OB ROS                             Anesthesia Physical Anesthesia Plan  ASA: 3  Anesthesia Plan: General ETT   Post-op Pain Management: Regional block  Induction: Intravenous  PONV Risk Score and Plan: Ondansetron, Dexamethasone, Midazolam and Treatment Mucha vary due to age or medical condition  Airway Management Planned: Oral ETT  Additional Equipment:   Intra-op Plan:   Post-operative Plan: Extubation in OR  Informed Consent: I have reviewed the patients History and Physical, chart, labs and discussed the procedure including the risks, benefits and alternatives for the proposed anesthesia with the patient or authorized representative who has indicated his/her understanding and acceptance.     Dental Advisory Given  Plan Discussed with: Anesthesiologist, CRNA and  Surgeon  Anesthesia Plan Comments: (Patient consented for risks of anesthesia including but not limited to:  - adverse reactions to medications - damage to eyes, teeth, lips or other oral mucosa - nerve damage due to positioning  - sore throat or hoarseness - Damage to heart, brain, nerves, lungs, other parts of body or loss of life  Patient voiced understanding.)        Anesthesia Quick Evaluation

## 2021-06-18 NOTE — Discharge Instructions (Addendum)
Post-Op Instructions - Rotator Cuff Repair  1. Bracing: You will wear a shoulder immobilizer or sling for 6 weeks.   2. Driving: No driving for 3 weeks post-op. When driving, do not wear the immobilizer. Ideally, we recommend no driving for 6 weeks while sling is in place as one arm will be immobilized.   3. Activity: No active lifting for 2 months. Wrist, hand, and elbow motion only. Avoid lifting the upper arm away from the body except for hygiene. You are permitted to bend and straighten the elbow passively only (no active elbow motion). You [place] use your hand and wrist for typing, writing, and managing utensils (cutting food). Do not lift more than a coffee cup for 8 weeks.  When sleeping or resting, inclined positions (recliner chair or wedge pillow) and a pillow under the forearm for support [place] provide better comfort for up to 4 weeks.  Avoid long distance travel for 4 weeks.  Return to normal activities after rotator cuff repair repair normally takes 6 months on average. If rehab goes very well, [place] be able to do most activities at 4 months, except overhead or contact sports.  4. Physical Therapy: Begins 3-4 days after surgery, and proceed 1 time per week for the first 6 weeks, then 1-2 times per week from weeks 6-20 post-op.  5. Medications:  - You will be provided a prescription for narcotic pain medicine. After surgery, take 1-2 narcotic tablets every 4 hours if needed for severe pain.  - A prescription for anti-nausea medication will be provided in case the narcotic medicine causes nausea - take 1 tablet every 6 hours only if nauseated.   - Take tylenol 1000 mg (2 Extra Strength tablets or 3 regular strength) every 8 hours for pain.  [place] decrease or stop tylenol 5 days after surgery if you are having minimal pain. - Take ASA 325mg/day x 2 weeks to help prevent DVTs/PEs (blood clots).  - DO NOT take ANY nonsteroidal anti-inflammatory pain medications (Advil, Motrin, Ibuprofen, Aleve,  Naproxen, or Naprosyn). These medicines can inhibit healing of your shoulder repair.    If you are taking prescription medication for anxiety, depression, insomnia, muscle spasm, chronic pain, or for attention deficit disorder, you are advised that you are at a higher risk of adverse effects with use of narcotics post-op, including narcotic addiction/dependence, depressed breathing, death. If you use non-prescribed substances: alcohol, marijuana, cocaine, heroin, methamphetamines, etc., you are at a higher risk of adverse effects with use of narcotics post-op, including narcotic addiction/dependence, depressed breathing, death. You are advised that taking > 50 morphine milligram equivalents (MME) of narcotic pain medication per day results in twice the risk of overdose or death. For your prescription provided: oxycodone 5 mg - taking more than 6 tablets per day would result in > 50 morphine milligram equivalents (MME) of narcotic pain medication. Be advised that we will prescribe narcotics short-term, for acute post-operative pain only - 3 weeks for major operations such as shoulder repair/reconstruction surgeries.     6. Post-Op Appointment:  Your first post-op appointment will be 10-14 days post-op.  7. Work or School: For most, but not all procedures, we advise staying out of work or school for at least 1 to 2 weeks in order to recover from the stress of surgery and to allow time for healing.   If you need a work or school note this can be provided.   8. Smoking: If you are a smoker, you need to refrain from   smoking in the postoperative period. The nicotine in cigarettes will inhibit healing of your shoulder repair and decrease the chance of successful repair. Similarly, nicotine containing products (gum, patches) should be avoided.   Post-operative Brace: Apply and remove the brace you received as you were instructed to at the time of fitting and as described in detail as the brace's  instructions for use indicate.  Wear the brace for the period of time prescribed by your physician.  The brace can be cleaned with soap and water and allowed to air dry only.  Should the brace result in increased pain, decreased feeling (numbness/tingling), increased swelling or an overall worsening of your medical condition, please contact your doctor immediately.  If an emergency situation occurs as a result of wearing the brace after normal business hours, please dial 911 and seek immediate medical attention.  Let your doctor know if you have any further questions about the brace issued to you. Refer to the shoulder sling instructions for use if you have any questions regarding the correct fit of your shoulder sling.  [facility] Customer Care for Troubleshooting: [phone]  Video that illustrates how to properly use a shoulder sling: "Instructions for Proper Use of an Orthopaedic Sling" https://www.youtube.com/watch?v=AHZpn_Xo45w   AMBULATORY SURGERY  DISCHARGE INSTRUCTIONS   The drugs that you were given will stay in your system until tomorrow so for the next 24 hours you should not:  Drive an automobile Make any legal decisions Drink any alcoholic beverage   You [place] resume regular meals tomorrow.  Today it is better to start with liquids and gradually work up to solid foods.  You [place] eat anything you prefer, but it is better to start with liquids, then soup and crackers, and gradually work up to solid foods.   Please notify your doctor immediately if you have any unusual bleeding, trouble breathing, redness and pain at the surgery site, drainage, fever, or pain not relieved by medication.    Additional Instructions:   Please contact your physician with any problems or Same Day Surgery at [phone], Monday through Friday 6 am to 4 pm, or [place] [facility] [place] [facility] number at [phone].    

## 2021-06-19 ENCOUNTER — Encounter: Payer: Self-pay | Admitting: Orthopedic Surgery

## 2021-07-30 ENCOUNTER — Ambulatory Visit: Payer: Medicaid Other | Admitting: Psychiatry

## 2021-08-06 ENCOUNTER — Other Ambulatory Visit: Payer: Self-pay | Admitting: Psychiatry

## 2021-08-06 DIAGNOSIS — F3178 Bipolar disorder, in full remission, most recent episode mixed: Secondary | ICD-10-CM

## 2021-08-14 ENCOUNTER — Other Ambulatory Visit: Payer: Self-pay

## 2021-08-14 ENCOUNTER — Ambulatory Visit (INDEPENDENT_AMBULATORY_CARE_PROVIDER_SITE_OTHER): Payer: Medicaid Other | Admitting: Psychiatry

## 2021-08-14 ENCOUNTER — Encounter: Payer: Self-pay | Admitting: Psychiatry

## 2021-08-14 VITALS — BP 149/84 | HR 81 | Temp 98.5°F | Wt 243.8 lb

## 2021-08-14 DIAGNOSIS — F411 Generalized anxiety disorder: Secondary | ICD-10-CM | POA: Diagnosis not present

## 2021-08-14 DIAGNOSIS — F17200 Nicotine dependence, unspecified, uncomplicated: Secondary | ICD-10-CM

## 2021-08-14 DIAGNOSIS — F410 Panic disorder [episodic paroxysmal anxiety] without agoraphobia: Secondary | ICD-10-CM | POA: Diagnosis not present

## 2021-08-14 DIAGNOSIS — F3178 Bipolar disorder, in full remission, most recent episode mixed: Secondary | ICD-10-CM | POA: Diagnosis not present

## 2021-08-14 DIAGNOSIS — F1220 Cannabis dependence, uncomplicated: Secondary | ICD-10-CM

## 2021-08-14 MED ORDER — TRAZODONE HCL 100 MG PO TABS: 200.0000 mg | ORAL_TABLET | Freq: Every evening | ORAL | 0 refills | Status: DC | PRN

## 2021-08-14 MED ORDER — BUSPIRONE HCL 30 MG PO TABS: 30.0000 mg | ORAL_TABLET | Freq: Two times a day (BID) | ORAL | 0 refills | Status: DC

## 2021-08-14 MED ORDER — ARIPIPRAZOLE 5 MG PO TABS: 5.0000 mg | ORAL_TABLET | ORAL | 0 refills | Status: DC

## 2021-08-14 MED ORDER — VENLAFAXINE HCL ER 37.5 MG PO CP24: 37.5000 mg | ORAL_CAPSULE | Freq: Every day | ORAL | 0 refills | Status: DC

## 2021-08-14 NOTE — Progress Notes (Signed)
[facility] MD OP Progress Note ? ?08/14/2021 5:42 PM ?Shelly Wright  ?MRN:  [id number] ? ?Chief Complaint:  ?Chief Complaint  ?Patient presents with  ? Follow-up: 51 year old Caucasian female with history of bipolar disorder, GAD, panic disorder, cannabis use disorder, tobacco use disorder, presented for medication management.  ? ?HPI: Shelly Wright is a 51 year old Caucasian female, on SSI, has a history of bipolar disorder, GAD, panic disorder, cannabis use disorder, tobacco use disorder, married, lives in [place] was evaluated in office today. ? ?Patient status post rotator cuff repair surgery in February 2023.  Currently on pain medications like oxycodone, muscle relaxants like methocarbamol, undergoing physical therapy. ? ?Patient reports overall mood symptoms are stable.  Denies any manic episodes, irritability, anger issues or depression. ? ?Patient denies any anxiety symptoms. ? ?Reports sleep is good. ? ?Patient reports she will need a back surgery soon and hence needs to stop smoking.  Her primary provider started her on Wellbutrin SR 150 mg twice a day. ? ?Patient is currently on polypharmacy, she is on medications like Effexor, BuSpar, Abilify, Wellbutrin and trazodone.   ? ?Patient denies any side effects. ? ?Denies suicidality, homicidality or perceptual disturbances. ? ?Continues to smoke cannabis not interested in quitting. ? ?Denies any other concerns today. ? ?Visit Diagnosis:  ?  ICD-10-CM   ?1. Bipolar disorder, in full remission, most recent episode mixed (HCC)  F31.78 ARIPiprazole (ABILIFY) 5 MG tablet  ?  ?2. GAD (generalized anxiety disorder)  F41.1 busPIRone (BUSPAR) 30 MG tablet  ?  traZODone (DESYREL) 100 MG tablet  ?  venlafaxine XR (EFFEXOR-XR) 37.5 MG 24 hr capsule  ?  ?3. Panic disorder  F41.0   ?  ?4. Cannabis use disorder, moderate, dependence (HCC)  F12.20   ?  ?5. Tobacco use disorder  F17.200   ?  ? ? ?Past Psychiatric History: Reviewed past psychiatric history from progress note on  09/16/2018.  Past trials of venlafaxine, trazodone, Seroquel, hydroxyzine, Zoloft, Lexapro, duloxetine, Paxil. ? ?Past Medical History:  ?Past Medical History:  ?Diagnosis Date  ? Acute right-sided low back pain with right-sided sciatica 08/30/2014  ? Anxiety   ? Arthritis   ? Asthma   ? in past, no current inhalers  ? Bipolar disorder (HCC)   ? Cervical spinal cord compression (HCC) 05/07/2012  ? COPD (chronic obstructive pulmonary disease) (HCC)   ? DDD (degenerative disc disease), lumbar   ? Depression   ? Dyspnea   ? with exertion  ? Dysrhythmia   ? irregular-h/o in the past  ? Fibromyalgia   ? Hypertension   ? Neuritis or radiculitis due to rupture of lumbar intervertebral disc 05/23/2014  ? 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 Restless leg syndrome   ? Soft tissue lesion of shoulder region 01/24/2012  ? 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 Syncope and collapse 04/08/2014  ? Thoracic and lumbosacral neuritis 05/07/2012  ? Thoracic neuritis 05/07/2012  ?  ?Past Surgical History:  ?Procedure Laterality Date  ? ABDOMINAL HYSTERECTOMY    ? Cervical Fusion X 2    ? JOINT REPLACEMENT Right 01/2016  ? Dr Gavin Potters  ? KNEE ARTHROPLASTY Left 02/10/2019  ? Procedure: COMPUTER ASSISTED TOTAL KNEE ARTHROPLASTY - RNFA;  Surgeon: Donato Heinz, MD;  Location: [facility] OR[facility];  Service: Orthopedics;  Laterality: Left;  ? KNEE ARTHROSCOPY Bilateral   ? Right knee scope 1998, Left knee scope  ?  REVERSE SHOULDER ARTHROPLASTY Right 03/12/2021  ? Procedure: Right reverse shoulder arthroplasty, biceps tenodesis;  Surgeon: Signa Kell, MD;  Location: [facility] OR[facility];  Service: Orthopedics;  Laterality: Right;  ? SHOULDER ARTHROSCOPY WITH SUBACROMIAL DECOMPRESSION AND OPEN ROTATOR C Left 06/18/2021  ? Procedure: Left shoulder arthroscopy, Open rotator cuff repair, subacromial decompression, distal clavicle excision and, open biceps tenodesis;   Surgeon: Signa Kell, MD;  Location: [facility] OR[facility];  Service: Orthopedics;  Laterality: Left;  ? TONSILLECTOMY    ? TOTAL KNEE ARTHROPLASTY Right 02/01/2015  ? Procedure: TOTAL KNEE ARTHROPLASTY;  Surgeon: Erin Sons, MD;  Location: [facility] OR[facility];  Service: Orthopedics;  Laterality: Right;  ? TUBAL LIGATION    ? ? ?Family Psychiatric History: Reviewed family psychiatric history from progress note on 09/16/2018. ? ?Family History:  ?Family History  ?Problem Relation Age of Onset  ? Breast cancer Mother 31  ? Colon cancer Father   ? Heart attack Father   ? Bipolar disorder Sister   ? Depression Sister   ? Schizophrenia Sister   ? ? ?Social History: Reviewed social history from progress note on 09/16/2018. ?Social History  ? ?Socioeconomic History  ? Marital status: Married  ?  Spouse name: Not on file  ? Number of children: 1  ? Years of education: Not on file  ? Highest education level: High school graduate  ?Occupational History  ? Not on file  ?Tobacco Use  ? Smoking status: Every Day  ?  Packs/day: 1.00  ?  Years: 30.00  ?  Pack years: 30.00  ?  Types: Cigarettes  ? Smokeless tobacco: Never  ? Tobacco comments:  ?  States she restarted several months back and no interest in quitting at this time.   ?Vaping Use  ? Vaping Use: Never used  ?Substance and Sexual Activity  ? Alcohol use: No  ? Drug use: Yes  ?  Types: Marijuana  ?  Comment: every day  ? Sexual activity: Not Currently  ?Other Topics Concern  ? Not on file  ?Social History Narrative  ? Not on file  ? ?Social Determinants of Health  ? ?Financial Resource Strain: Not on file  ?Food Insecurity: Not on file  ?Transportation Needs: Not on file  ?Physical Activity: Not on file  ?Stress: Not on file  ?Social Connections: Not on file  ? ? ?Allergies: No Known Allergies ? ?Metabolic Disorder Labs: ?Lab Results  ?Component Value Date  ? HGBA1C 6.2 (H) 09/26/2020  ? MPG 131.24 09/26/2020  ? ?Lab Results  ?Component Value Date  ? PROLACTIN 6.4 09/26/2020  ? ?Lab Results   ?Component Value Date  ? CHOL 164 09/26/2020  ? TRIG 99 09/26/2020  ? HDL 44 09/26/2020  ? CHOLHDL 3.7 09/26/2020  ? VLDL 20 09/26/2020  ? LDLCALC 100 (H) 09/26/2020  ? ?Lab Results  ?Component Value Date  ? TSH 0.696 09/26/2020  ? ? ?Therapeutic Level Labs: ?No results found for: LITHIUM ?No results found for: VALPROATE ?No components found for:  CBMZ ? ?Current Medications: ?Current Outpatient Medications  ?Medication Sig Dispense Refill  ? acetaminophen (TYLENOL) 500 MG tablet Take 2 tablets (1,000 mg total) by mouth every 8 (eight) hours. 90 tablet 2  ? ADVAIR DISKUS 250-50 MCG/DOSE AEPB Inhale 1 puff into the lungs daily.    ? albuterol (VENTOLIN HFA) 108 (90 Base) MCG/ACT inhaler Inhale 2 puffs into the lungs every 6 (six) hours as needed for wheezing or shortness of breath.    ? ARIPiprazole (ABILIFY) 5 MG tablet  Take 1 tablet (5 mg total) by mouth as directed. Take 1 tablet daily for 2 weeks and then take every other day for 7 doses and stop 21 tablet 0  ? buPROPion (WELLBUTRIN SR) 150 MG 12 hr tablet Take 150 mg by mouth 2 (two) times daily.    ? gabapentin (NEURONTIN) 800 MG tablet Take 1 tablet (800 mg total) by mouth every 6 (six) hours. (Patient taking differently: Take 800 mg by mouth every 6 (six) hours as needed (pain.).) 120 tablet 5  ? lidocaine (XYLOCAINE) 5 % ointment Apply 1 application topically as needed for moderate pain or mild pain.    ? meloxicam (MOBIC) 7.5 MG tablet Take 7.5 mg by mouth 2 (two) times daily as needed.    ? methocarbamol (ROBAXIN) 750 MG tablet Take 1 tablet (750 mg total) by mouth 3 (three) times daily. Max: 3/day 90 tablet 5  ? ondansetron (ZOFRAN-ODT) 4 MG disintegrating tablet Take 1 tablet (4 mg total) by mouth every 8 (eight) hours as needed for nausea or vomiting. 20 tablet 0  ? oxyCODONE (ROXICODONE) 5 MG immediate release tablet Take 1-2 tablets (5-10 mg total) by mouth every 4 (four) hours as needed (pain). 30 tablet 0  ? simvastatin (ZOCOR) 20 MG tablet Take 20  mg by mouth at bedtime.     ? venlafaxine XR (EFFEXOR XR) 150 MG 24 hr capsule Take 1 capsule (150 mg total) by mouth See admin instructions. Take with 37.5 mg daily 90 capsule 1  ? Vibegron (GEMTESA) 75 MG

## 2021-09-07 ENCOUNTER — Other Ambulatory Visit: Payer: Self-pay | Admitting: Psychiatry

## 2021-09-07 DIAGNOSIS — F411 Generalized anxiety disorder: Secondary | ICD-10-CM

## 2021-09-19 ENCOUNTER — Other Ambulatory Visit: Payer: Self-pay | Admitting: Orthopedic Surgery

## 2021-09-19 DIAGNOSIS — M75122 Complete rotator cuff tear or rupture of left shoulder, not specified as traumatic: Secondary | ICD-10-CM

## 2021-09-26 ENCOUNTER — Other Ambulatory Visit: Payer: Self-pay | Admitting: Orthopedic Surgery

## 2021-09-26 DIAGNOSIS — M75122 Complete rotator cuff tear or rupture of left shoulder, not specified as traumatic: Secondary | ICD-10-CM

## 2021-10-04 ENCOUNTER — Ambulatory Visit
Admission: RE | Admit: 2021-10-04 | Discharge: 2021-10-04 | Disposition: A | Discharge status: Home / Self Care | Payer: Medicaid Other | Source: Ambulatory Visit | Attending: Orthopedic Surgery | Admitting: Orthopedic Surgery

## 2021-10-04 DIAGNOSIS — M75122 Complete rotator cuff tear or rupture of left shoulder, not specified as traumatic: Secondary | ICD-10-CM

## 2021-10-05 ENCOUNTER — Ambulatory Visit (INDEPENDENT_AMBULATORY_CARE_PROVIDER_SITE_OTHER): Payer: Medicaid Other | Admitting: Psychiatry

## 2021-10-05 ENCOUNTER — Encounter: Payer: Self-pay | Admitting: Psychiatry

## 2021-10-05 VITALS — BP 150/85 | HR 65 | Temp 98.5°F | Wt 246.4 lb

## 2021-10-05 DIAGNOSIS — F3178 Bipolar disorder, in full remission, most recent episode mixed: Secondary | ICD-10-CM

## 2021-10-05 DIAGNOSIS — F17200 Nicotine dependence, unspecified, uncomplicated: Secondary | ICD-10-CM

## 2021-10-05 DIAGNOSIS — Z630 Problems in relationship with spouse or partner: Secondary | ICD-10-CM | POA: Diagnosis not present

## 2021-10-05 DIAGNOSIS — F410 Panic disorder [episodic paroxysmal anxiety] without agoraphobia: Secondary | ICD-10-CM | POA: Diagnosis not present

## 2021-10-05 DIAGNOSIS — F1220 Cannabis dependence, uncomplicated: Secondary | ICD-10-CM

## 2021-10-05 DIAGNOSIS — F411 Generalized anxiety disorder: Secondary | ICD-10-CM

## 2021-10-05 HISTORY — DX: Problems in relationship with spouse or partner: Z63.0

## 2021-10-05 MED ORDER — BUSPIRONE HCL 30 MG PO TABS: 30.0000 mg | ORAL_TABLET | Freq: Two times a day (BID) | ORAL | 0 refills | Status: DC

## 2021-10-05 MED ORDER — VENLAFAXINE HCL ER 37.5 MG PO CP24: 37.5000 mg | ORAL_CAPSULE | Freq: Every day | ORAL | 0 refills | Status: DC

## 2021-10-05 MED ORDER — TRAZODONE HCL 100 MG PO TABS: 200.0000 mg | ORAL_TABLET | Freq: Every evening | ORAL | 0 refills | Status: DC | PRN

## 2021-10-05 MED ORDER — VENLAFAXINE HCL ER 150 MG PO CP24: 150.0000 mg | ORAL_CAPSULE | ORAL | 0 refills | Status: DC

## 2021-10-05 NOTE — Progress Notes (Signed)
BH MD OP Progress Note  10/05/2021 9:15 AM Shelly Wright  MRN:  [id number]  Chief Complaint:  Chief Complaint  Patient presents with   Follow-up: 51 year old Caucasian female with history of bipolar disorder, GAD, panic disorder, cannabis use disorder, tobacco use disorder, presented for medication management.   HPI: Shelly Wright is a 51 year old Caucasian female on SSI, has a history of bipolar disorder, GAD, panic disorder, cannabis use disorder, tobacco use disorder, married, lives in [place] was evaluated in office today.  Patient continues to struggle with chronic pain, reports left shoulder pain, status post surgery however Timoney need repeat surgery soon.  She also struggles with chronic back pain.  Patient is currently on tramadol which was recently prescribed which helps to some extent.  With the tramadol she rates her pain at a 5 out of 10, 10 being the worst.  Patient reports relationship struggles with her spouse.  She however declines counseling.  She reports she talks to her friends and stays busy doing gem art as well as going out with her friends.  That helps.  Patient denies any significant mood lability or anxiety.  Reports her current medications as beneficial.  She is currently also on Wellbutrin which was prescribed for smoking cessation by her primary care provider.  Patient was able to quit smoking last month.  She reports her appetite as high since she stopped smoking.  She Ciesla have gained a few pounds.  Patient denies any suicidality, homicidality or perceptual disturbances.  Patient denies any other concerns today.  Visit Diagnosis:    ICD-10-CM   1. Bipolar disorder, in full remission, most recent episode mixed (HCC)  F31.78 traZODone (DESYREL) 100 MG tablet    2. GAD (generalized anxiety disorder)  F41.1 venlafaxine XR (EFFEXOR-XR) 37.5 MG 24 hr capsule    venlafaxine XR (EFFEXOR XR) 150 MG 24 hr capsule    traZODone (DESYREL) 100 MG tablet    busPIRone (BUSPAR)  30 MG tablet    3. Panic disorder  F41.0 venlafaxine XR (EFFEXOR-XR) 37.5 MG 24 hr capsule    venlafaxine XR (EFFEXOR XR) 150 MG 24 hr capsule    busPIRone (BUSPAR) 30 MG tablet    4. Partner relational problem  Z63.0     5. Cannabis use disorder, moderate, dependence (HCC)  F12.20     6. Tobacco use disorder  F17.200    in remission      Past Psychiatric History: Reviewed past psychiatric history from progress note on 09/16/2018.  Past trials of venlafaxine, trazodone, Seroquel, hydroxyzine, Zoloft, Lexapro, duloxetine, Paxil.  Past Medical History:  Past Medical History:  Diagnosis Date   Acute right-sided low back pain with right-sided sciatica 08/30/2014   Anxiety    Arthritis    Asthma    in past, no current inhalers   Bipolar disorder (HCC)    Cervical spinal cord compression (HCC) 05/07/2012   COPD (chronic obstructive pulmonary disease) (HCC)    DDD (degenerative disc disease), lumbar    Depression    Dyspnea    with exertion   Dysrhythmia    irregular-h/o in the past   Fibromyalgia    Hypertension    Neuritis or radiculitis due to rupture of lumbar intervertebral disc 05/23/2014   Periprosthetic fracture around prosthetic knee 07/31/2015   Restless leg syndrome    Soft tissue lesion of shoulder region 01/24/2012   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  Syncope and collapse 04/08/2014   Thoracic and lumbosacral neuritis 05/07/2012   Thoracic neuritis 05/07/2012    Past Surgical History:  Procedure Laterality Date   ABDOMINAL HYSTERECTOMY     Cervical Fusion X 2     JOINT REPLACEMENT Right 01/2016   Dr Gavin Potters   KNEE ARTHROPLASTY Left 02/10/2019   Procedure: COMPUTER ASSISTED TOTAL KNEE ARTHROPLASTY - RNFA;  Surgeon: Donato Heinz, MD;  Location: [facility];  Service: Orthopedics;  Laterality: Left;   KNEE ARTHROSCOPY Bilateral    Right knee scope 1998, Left knee scope   REVERSE  SHOULDER ARTHROPLASTY Right 03/12/2021   Procedure: Right reverse shoulder arthroplasty, biceps tenodesis;  Surgeon: Signa Kell, MD;  Location: [facility];  Service: Orthopedics;  Laterality: Right;   SHOULDER ARTHROSCOPY WITH SUBACROMIAL DECOMPRESSION AND OPEN ROTATOR C Left 06/18/2021   Procedure: Left shoulder arthroscopy, Open rotator cuff repair, subacromial decompression, distal clavicle excision and, open biceps tenodesis;  Surgeon: Signa Kell, MD;  Location: [facility];  Service: Orthopedics;  Laterality: Left;   TONSILLECTOMY     TOTAL KNEE ARTHROPLASTY Right 02/01/2015   Procedure: TOTAL KNEE ARTHROPLASTY;  Surgeon: Erin Sons, MD;  Location: [facility];  Service: Orthopedics;  Laterality: Right;   TUBAL LIGATION      Family Psychiatric History: Reviewed family psychiatric history from progress note on 09/16/2018.  Family History:  Family History  Problem Relation Age of Onset   Breast cancer Mother 45   Colon cancer Father    Heart attack Father    Bipolar disorder Sister    Depression Sister    Schizophrenia Sister     Social History: Reviewed social history from progress note on 09/16/2018. Social History   Socioeconomic History   Marital status: Married    Spouse name: Not on file   Number of children: 1   Years of education: Not on file   Highest education level: High school graduate  Occupational History   Not on file  Tobacco Use   Smoking status: Former    Packs/day: 1.00    Years: 30.00    Pack years: 30.00    Types: Cigarettes    Quit date: 09/11/2021    Years since quitting: 0.0   Smokeless tobacco: Never  Vaping Use   Vaping Use: Never used  Substance and Sexual Activity   Alcohol use: No   Drug use: Yes    Types: Marijuana    Comment: every day   Sexual activity: Not Currently  Other Topics Concern   Not on file  Social History Narrative   Not on file   Social Determinants of Health   Financial Resource Strain: Not on file  Food  Insecurity: Not on file  Transportation Needs: Not on file  Physical Activity: Not on file  Stress: Not on file  Social Connections: Not on file    Allergies: No Known Allergies  Metabolic Disorder Labs: Lab Results  Component Value Date   HGBA1C 6.2 (H) 09/26/2020   MPG 131.24 09/26/2020   Lab Results  Component Value Date   PROLACTIN 6.4 09/26/2020   Lab Results  Component Value Date   CHOL 164 09/26/2020   TRIG 99 09/26/2020   HDL 44 09/26/2020   CHOLHDL 3.7 09/26/2020   VLDL 20 09/26/2020   LDLCALC 100 (H) 09/26/2020   Lab Results  Component Value Date   TSH 0.696 09/26/2020    Therapeutic Level Labs: No results found for: LITHIUM No results found for: VALPROATE No components found  for:  CBMZ  Current Medications: Current Outpatient Medications  Medication Sig Dispense Refill   acetaminophen (TYLENOL) 500 MG tablet Take 2 tablets (1,000 mg total) by mouth every 8 (eight) hours. 90 tablet 2   ADVAIR DISKUS 250-50 MCG/DOSE AEPB Inhale 1 puff into the lungs daily.     albuterol (VENTOLIN HFA) 108 (90 Base) MCG/ACT inhaler Inhale 2 puffs into the lungs every 6 (six) hours as needed for wheezing or shortness of breath.     buPROPion (WELLBUTRIN SR) 150 MG 12 hr tablet Take 150 mg by mouth 2 (two) times daily.     busPIRone (BUSPAR) 30 MG tablet Take 1 tablet (30 mg total) by mouth 2 (two) times daily. 180 tablet 0   gabapentin (NEURONTIN) 800 MG tablet Take 1 tablet (800 mg total) by mouth every 6 (six) hours. (Patient taking differently: Take 800 mg by mouth every 6 (six) hours as needed (pain.).) 120 tablet 5   lidocaine (XYLOCAINE) 5 % ointment Apply 1 application topically as needed for moderate pain or mild pain.     meloxicam (MOBIC) 7.5 MG tablet Take 7.5 mg by mouth 2 (two) times daily as needed.     methocarbamol (ROBAXIN) 750 MG tablet Take 1 tablet (750 mg total) by mouth 3 (three) times daily. Max: 3/day 90 tablet 5   simvastatin (ZOCOR) 20 MG tablet Take  20 mg by mouth at bedtime.      traMADol (ULTRAM) 50 MG tablet Take by mouth.     traZODone (DESYREL) 100 MG tablet Take 2 tablets (200 mg total) by mouth at bedtime as needed for sleep. 180 tablet 0   venlafaxine XR (EFFEXOR XR) 150 MG 24 hr capsule Take 1 capsule (150 mg total) by mouth See admin instructions. Take with 37.5 mg daily 90 capsule 0   venlafaxine XR (EFFEXOR-XR) 37.5 MG 24 hr capsule Take 1 capsule (37.5 mg total) by mouth daily with breakfast. Start taking along with 150 mg 90 capsule 0   Vibegron (GEMTESA) 75 MG TABS Take 75 mg by mouth in the morning.     No current facility-administered medications for this visit.     Musculoskeletal: Strength & Muscle Tone: within normal limits Gait & Station: Walks with cane  Patient leans: N/A  Psychiatric Specialty Exam: Review of Systems  Musculoskeletal:  Positive for back pain (Chronic).       Left shoulder pain-chronic  Psychiatric/Behavioral:         Reports she feels sad because of relationship struggles with spouse.  All other systems reviewed and are negative.  Blood pressure (!) 150/85, pulse 65, temperature 98.5 F (36.9 C), temperature source Temporal, weight 246 lb 6.4 oz (111.8 kg).Body mass index is 39.77 kg/m.  General Appearance: Casual  Eye Contact:  Good  Speech:  Clear and Coherent  Volume:  Normal  Mood:   Sad-situational  Affect:  Congruent  Thought Process:  Goal Directed and Descriptions of Associations: Intact  Orientation:  Full (Time, Place, and Person)  Thought Content: Logical   Suicidal Thoughts:  No  Homicidal Thoughts:  No  Memory:  Immediate;   Fair Recent;   Fair Remote;   Fair  Judgement:  Fair  Insight:  Fair  Psychomotor Activity:  Normal  Concentration:  Concentration: Fair and Attention Span: Fair  Recall:  [employer]  Fund of Knowledge: Fair  Language: Fair  Akathisia:  No  Handed:  Right  AIMS (if indicated): done  Assets:  Communication Skills Desire for  Improvement Housing Social Support  ADL's:  Intact  Cognition: WNL  Sleep:  Fair   Screenings: AIMS    Flowsheet Row Office Visit from 10/05/2021 in [facility] Office Visit from 11/27/2020 in [facility] Office Visit from 09/26/2020 in [facility]  AIMS Total Score 0 0 0      GAD-7    Flowsheet Row Office Visit from 08/14/2021 in [facility] Office Visit from 11/27/2020 in [facility] Office Visit from 09/26/2020 in [facility]  Total GAD-7 Score 0 4 5      PHQ2-9    Flowsheet Row Office Visit from 10/05/2021 in [facility] Office Visit from 05/07/2021 in [facility] Office Visit from 02/13/2021 in [facility] Office Visit from 11/27/2020 in [facility] Office Visit from 09/26/2020 in [facility]  PHQ-2 Total Score 2 0 PHQ-9 Total Score 4 -- Flowsheet Row Office Visit from 10/05/2021 in [facility] Pre-Admission Testing 45 from 06/12/2021 in [facility] Pre-Admission Testing 60 from 03/05/2021 in [facility]  C-SSRS RISK CATEGORY No Risk No Risk No Risk        Assessment and Plan: Shelly Wright is a 51 year old Caucasian female on SSI, history of bipolar disorder, tobacco use disorder, COPD was evaluated in office today.  Patient continues to struggle with chronic pain, currently awaiting another surgery of her shoulder.  Patient with relationship struggles will benefit from psychotherapy sessions however she declines.  Plan as noted below.  Plan Bipolar disorder in remission Continue gabapentin 800 mg 4 times a day-although prescribed for  pain is a mood stabilizer. Venlafaxine 187.5 mg p.o. daily BuSpar 30 mg p.o. twice daily.  We will consider reducing the dosage in future. Discontinue Abilify-was tapered off.  GAD/panic disorder-stable Venlafaxine 187.5 mg p.o. daily BuSpar 30 mg p.o. twice daily Propranolol 10 mg p.o. 3 times daily as needed  Cannabis use disorder moderate-unstable Provided counseling, patient not receptive.  Tobacco use disorder in remission Wellbutrin SR 150 mg p.o. twice daily.  Will continue the same.  She quit smoking a month ago.  Partner relational problem-unstable Provided counseling.  Discussed referral for psychotherapy, interpersonal therapy or family counseling-patient declines.  Patient with elevated blood pressure reading in session today advised to monitor and follow up with primary care provider.  Patient in pain likely could be contributing to the blood pressure being high.  Follow-up in clinic in 2 to 3 months or sooner if needed.  This note was generated in part or whole with voice recognition software. Voice recognition is usually quite accurate but there are transcription errors that can and very often do occur. I apologize for any typographical errors that were not detected and corrected.     Jomarie Longs, MD 10/05/2021, 9:15 AM

## 2021-12-04 ENCOUNTER — Other Ambulatory Visit: Payer: Self-pay | Admitting: Psychiatry

## 2021-12-04 DIAGNOSIS — F3178 Bipolar disorder, in full remission, most recent episode mixed: Secondary | ICD-10-CM

## 2021-12-04 DIAGNOSIS — F410 Panic disorder [episodic paroxysmal anxiety] without agoraphobia: Secondary | ICD-10-CM

## 2021-12-04 DIAGNOSIS — F411 Generalized anxiety disorder: Secondary | ICD-10-CM

## 2022-01-07 ENCOUNTER — Encounter: Payer: Self-pay | Admitting: Psychiatry

## 2022-01-07 ENCOUNTER — Ambulatory Visit (INDEPENDENT_AMBULATORY_CARE_PROVIDER_SITE_OTHER): Payer: Medicaid Other | Admitting: Psychiatry

## 2022-01-07 VITALS — BP 146/82 | HR 92 | Temp 98.7°F | Wt 240.8 lb

## 2022-01-07 DIAGNOSIS — F17200 Nicotine dependence, unspecified, uncomplicated: Secondary | ICD-10-CM

## 2022-01-07 DIAGNOSIS — F411 Generalized anxiety disorder: Secondary | ICD-10-CM

## 2022-01-07 DIAGNOSIS — F1220 Cannabis dependence, uncomplicated: Secondary | ICD-10-CM | POA: Diagnosis not present

## 2022-01-07 DIAGNOSIS — Z79899 Other long term (current) drug therapy: Secondary | ICD-10-CM

## 2022-01-07 DIAGNOSIS — F3178 Bipolar disorder, in full remission, most recent episode mixed: Secondary | ICD-10-CM

## 2022-01-07 DIAGNOSIS — F410 Panic disorder [episodic paroxysmal anxiety] without agoraphobia: Secondary | ICD-10-CM | POA: Diagnosis not present

## 2022-01-07 MED ORDER — BUSPIRONE HCL 30 MG PO TABS: 30.0000 mg | ORAL_TABLET | Freq: Two times a day (BID) | ORAL | 0 refills | Status: DC

## 2022-01-07 NOTE — Progress Notes (Signed)
BH MD OP Progress Note  01/07/2022 12:57 PM Shelly Wright  MRN:  [id number]  Chief Complaint:  Chief Complaint  Patient presents with   Follow-up: 51 year old Caucasian female with history of bipolar disorder, GAD, panic disorder, cannabis use disorder, tobacco use disorder, presented for medication management.   HPI: Shelly Wright is a 51 year old Caucasian female on SSI, has a history of bipolar disorder, GAD, panic disorder, cannabis use disorder, tobacco use disorder, married, lives in [place] was evaluated in office today.  Patient today reports she is currently struggling with shoulder joint pain and is scheduled for surgery.  She already had surgery in the past however she continues to struggle with pain.  Currently taking tramadol which does not help much.  Patient also currently going through psychosocial stressors of legal issues related to inherited property.  That does worry her.  Patient however reports overall medications as beneficial for her mood.  Denies any significant depression, manic episodes.  Denies any panic attacks.  Reports sleep is overall good.  Continues to use cannabis, not willing to quit.  Patient was able to quit smoking however restarted back again due to current stressors.  Patient denies any side effects to current psychotropics, compliant on them.  Denies any suicidality, homicidality or perceptual disturbances.  Patient denies any other concerns today.  Visit Diagnosis:    ICD-10-CM   1. Bipolar disorder, in full remission, most recent episode mixed (HCC)  F31.78 Platelet count    Sodium    TSH    2. GAD (generalized anxiety disorder)  F41.1 busPIRone (BUSPAR) 30 MG tablet    3. Panic disorder  F41.0 busPIRone (BUSPAR) 30 MG tablet    4. Cannabis use disorder, moderate, dependence (HCC)  F12.20     5. Tobacco use disorder  F17.200     6. High risk medication use  Z79.899 Platelet count    Sodium    TSH      Past Psychiatric  History: Reviewed past psychiatric history from progress note on 09/16/2018.  Past trials of venlafaxine, trazodone, Seroquel, hydroxyzine, Zoloft, Lexapro, duloxetine, Paxil.  Past Medical History:  Past Medical History:  Diagnosis Date   Acute right-sided low back pain with right-sided sciatica 08/30/2014   Anxiety    Arthritis    Asthma    in past, no current inhalers   Bipolar disorder (HCC)    Cervical spinal cord compression (HCC) 05/07/2012   COPD (chronic obstructive pulmonary disease) (HCC)    DDD (degenerative disc disease), lumbar    Depression    Dyspnea    with exertion   Dysrhythmia    irregular-h/o in the past   Fibromyalgia    Hypertension    Neuritis or radiculitis due to rupture of lumbar intervertebral disc 05/23/2014   Periprosthetic fracture around prosthetic knee 07/31/2015   Restless leg syndrome    Soft tissue lesion of shoulder region 01/24/2012   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   Syncope and collapse 04/08/2014   Thoracic and lumbosacral neuritis 05/07/2012   Thoracic neuritis 05/07/2012    Past Surgical History:  Procedure Laterality Date   ABDOMINAL HYSTERECTOMY     Cervical Fusion X 2     JOINT REPLACEMENT Right 01/2016   Dr Gavin Potters   KNEE ARTHROPLASTY Left 02/10/2019   Procedure: COMPUTER ASSISTED TOTAL KNEE ARTHROPLASTY - RNFA;  Surgeon: Donato Heinz, MD;  Location: [facility] ORS;  Service: Orthopedics;  Laterality: Left;   KNEE ARTHROSCOPY Bilateral    Right knee scope 1998, Left knee scope   REVERSE SHOULDER ARTHROPLASTY Right 03/12/2021   Procedure: Right reverse shoulder arthroplasty, biceps tenodesis;  Surgeon: Signa Kell, MD;  Location: [facility] ORS;  Service: Orthopedics;  Laterality: Right;   SHOULDER ARTHROSCOPY WITH SUBACROMIAL DECOMPRESSION AND OPEN ROTATOR C Left 06/18/2021   Procedure: Left shoulder arthroscopy, Open rotator cuff repair, subacromial  decompression, distal clavicle excision and, open biceps tenodesis;  Surgeon: Signa Kell, MD;  Location: [facility] ORS;  Service: Orthopedics;  Laterality: Left;   TONSILLECTOMY     TOTAL KNEE ARTHROPLASTY Right 02/01/2015   Procedure: TOTAL KNEE ARTHROPLASTY;  Surgeon: Erin Sons, MD;  Location: [facility] ORS;  Service: Orthopedics;  Laterality: Right;   TUBAL LIGATION      Family Psychiatric History: Reviewed family psychiatric history from progress note on 09/16/2018.  Family History:  Family History  Problem Relation Age of Onset   Breast cancer Mother 12   Colon cancer Father    Heart attack Father    Bipolar disorder Sister    Depression Sister    Schizophrenia Sister     Social History: Reviewed social history from progress note on 09/16/2018. Social History   Socioeconomic History   Marital status: Married    Spouse name: Not on file   Number of children: 1   Years of education: Not on file   Highest education level: High school graduate  Occupational History   Not on file  Tobacco Use   Smoking status: Former    Packs/day: 1.00    Years: 30.00    Total pack years: 30.00    Types: Cigarettes    Quit date: 09/11/2021    Years since quitting: 0.3   Smokeless tobacco: Never  Vaping Use   Vaping Use: Never used  Substance and Sexual Activity   Alcohol use: No   Drug use: Yes    Types: Marijuana    Comment: every day   Sexual activity: Not Currently  Other Topics Concern   Not on file  Social History Narrative   Not on file   Social Determinants of Health   Financial Resource Strain: Low Risk  (09/16/2018)   Overall Financial Resource Strain (CARDIA)    Difficulty of Paying Living Expenses: Not hard at all  Food Insecurity: No Food Insecurity (09/16/2018)   Hunger Vital Sign    Worried About Running Out of Food in the Last Year: Never true    Ran Out of Food in the Last Year: Never true  Transportation Needs: No Transportation Needs (09/16/2018)   PRAPARE -  Administrator, Civil Service (Medical): No    Lack of Transportation (Non-Medical): No  Physical Activity: Inactive (09/16/2018)   Exercise Vital Sign    Days of Exercise per Week: 0 days    Minutes of Exercise per Session: 0 min  Stress: Stress Concern Present (09/16/2018)   [employer] of Occupational Health - Occupational Stress Questionnaire    Feeling of Stress : To some extent  Social Connections: Unknown (09/16/2018)   Social Connection and Isolation Panel [NHANES]    Frequency of Communication with Friends and Family: Not on file    Frequency of Social Gatherings with Friends and Family: Not on file    Attends Religious Services: Never    Active Member of Clubs or Organizations: No    Attends Banker Meetings: Never    Marital Status: Separated  Allergies: No Known Allergies  Metabolic Disorder Labs: Lab Results  Component Value Date   HGBA1C 6.2 (H) 09/26/2020   MPG 131.24 09/26/2020   Lab Results  Component Value Date   PROLACTIN 6.4 09/26/2020   Lab Results  Component Value Date   CHOL 164 09/26/2020   TRIG 99 09/26/2020   HDL 44 09/26/2020   CHOLHDL 3.7 09/26/2020   VLDL 20 09/26/2020   LDLCALC 100 (H) 09/26/2020   Lab Results  Component Value Date   TSH 0.696 09/26/2020    Therapeutic Level Labs: No results found for: "LITHIUM" No results found for: "VALPROATE" No results found for: "CBMZ"  Current Medications: Current Outpatient Medications  Medication Sig Dispense Refill   acetaminophen (TYLENOL) 500 MG tablet Take 2 tablets (1,000 mg total) by mouth every 8 (eight) hours. 90 tablet 2   ADVAIR DISKUS 250-50 MCG/DOSE AEPB Inhale 1 puff into the lungs daily.     albuterol (VENTOLIN HFA) 108 (90 Base) MCG/ACT inhaler Inhale 2 puffs into the lungs every 6 (six) hours as needed for wheezing or shortness of breath.     buPROPion (WELLBUTRIN SR) 150 MG 12 hr tablet Take 150 mg by mouth 2 (two) times daily.     gabapentin  (NEURONTIN) 800 MG tablet Take 1 tablet (800 mg total) by mouth every 6 (six) hours. (Patient taking differently: Take 800 mg by mouth every 6 (six) hours as needed (pain.).) 120 tablet 5   lidocaine (XYLOCAINE) 5 % ointment Apply 1 application topically as needed for moderate pain or mild pain.     meloxicam (MOBIC) 7.5 MG tablet Take 7.5 mg by mouth 2 (two) times daily as needed.     methocarbamol (ROBAXIN) 750 MG tablet Take 1 tablet (750 mg total) by mouth 3 (three) times daily. Max: 3/day 90 tablet 5   simvastatin (ZOCOR) 20 MG tablet Take 20 mg by mouth at bedtime.      traMADol (ULTRAM) 50 MG tablet Take by mouth.     traZODone (DESYREL) 100 MG tablet TAKE 2 TABLETS BY MOUTH AT BEDTIME AS NEEDED FOR SLEEP 180 tablet 0   venlafaxine XR (EFFEXOR-XR) 150 MG 24 hr capsule TAKE 1 CAPSULE BY MOUTH DAILY ALONG WITHTHE 37.5MG  CAPSULE 90 capsule 0   venlafaxine XR (EFFEXOR-XR) 37.5 MG 24 hr capsule TAKE 1 CAPSULE BY MOUTH DAILY WITH BREAKFAST ALONG WITH THE 150MG  CAPSULE 90 capsule 0   Vibegron (GEMTESA) 75 MG TABS Take 75 mg by mouth in the morning.     busPIRone (BUSPAR) 30 MG tablet Take 1 tablet (30 mg total) by mouth 2 (two) times daily. 180 tablet 0   No current facility-administered medications for this visit.     Musculoskeletal: Strength & Muscle Tone: within normal limits Gait & Station: normal Patient leans: N/A  Psychiatric Specialty Exam: Review of Systems  Musculoskeletal:  Positive for arthralgias and back pain.       Shoulder pain- chronic  Psychiatric/Behavioral:  The patient is nervous/anxious.   All other systems reviewed and are negative.   Blood pressure (!) 146/82, pulse 92, temperature 98.7 F (37.1 C), temperature source Temporal, weight 240 lb 12.8 oz (109.2 kg).Body mass index is 38.87 kg/m.  General Appearance: Casual  Eye Contact:  Fair  Speech:  Clear and Coherent  Volume:  Normal  Mood:  Anxious  Affect:  Congruent  Thought Process:  Goal Directed and  Descriptions of Associations: Intact  Orientation:  Full (Time, Place, and Person)  Thought Content: Logical  Suicidal Thoughts:  No  Homicidal Thoughts:  No  Memory:  Immediate;   Fair Recent;   Fair Remote;   Fair  Judgement:  Fair  Insight:  Fair  Psychomotor Activity:  Normal  Concentration:  Concentration: Fair and Attention Span: Fair  Recall:  [employer] of Knowledge: Fair  Language: Fair  Akathisia:  No  Handed:  Right  AIMS (if indicated): done  Assets:  Communication Skills Desire for Improvement Housing Intimacy Social Support Talents/Skills  ADL's:  Intact  Cognition: WNL  Sleep:  Fair   Screenings: AIMS    Flowsheet Row Office Visit from 01/07/2022 in [facility] Office Visit from 10/05/2021 in [facility] Office Visit from 11/27/2020 in [facility] Office Visit from 09/26/2020 in [facility]  AIMS Total Score 0 0 0 0      GAD-7    Flowsheet Row Office Visit from 01/07/2022 in [facility] Office Visit from 08/14/2021 in [facility] Office Visit from 11/27/2020 in [facility] Office Visit from 09/26/2020 in [facility]  Total GAD-7 Score 4 0 4 5      PHQ2-9    Flowsheet Row Office Visit from 01/07/2022 in [facility] Office Visit from 10/05/2021 in [facility] Office Visit from 05/07/2021 in [facility] Office Visit from 02/13/2021 in [facility] Office Visit from 11/27/2020 in [facility]  PHQ-2 Total Score 1 2 0 2 2  PHQ-9 Total Score -- 4 -- 3 4      Flowsheet Row Office Visit from 01/07/2022 in [facility] Office Visit from 10/05/2021 in [facility] Pre-Admission Testing 45 from 06/12/2021 in [facility] PRE [facility]MISS[facility] TESTING  C-SSRS RISK CATEGORY No Risk No Risk No Risk        Assessment and Plan: Shelly Wright is a 51 year old Caucasian female on SSI, history of bipolar disorder, tobacco use disorder, COPD was evaluated in office today.  Patient continues to struggle with chronic pain, awaiting surgery of her shoulder.  Patient will benefit from the following plan.  Plan Bipolar disorder in remission Gabapentin 800 mg 4 times a day-although prescribed for pain, is a mood stabilizer. Venlafaxine extended release 187.5 mg p.o. daily BuSpar 30 mg p.o. twice daily  GAD/panic disorder-stable Venlafaxine extended release 187.5 mg p.o. daily BuSpar 30 mg p.o. twice daily Propranolol 10 mg p.o. 3 times daily as needed  Cannabis use disorder moderate-unstable Patient not willing to quit.  Tobacco use disorder-unstable Wellbutrin SR 150 mg p.o. twice daily Recently started smoking again.   Patient with elevated blood pressure reading in session, advised to follow up with primary care provider.  Follow-up in clinic in 3 months or sooner if needed.  This note was generated in part or whole with voice recognition software. Voice recognition is usually quite accurate but there are transcription errors that can and very often do occur. I apologize for any typographical errors that were not detected and corrected.    Jomarie Longs, MD 01/07/2022, 12:57 PM

## 2022-01-10 ENCOUNTER — Other Ambulatory Visit: Payer: Self-pay | Admitting: Orthopedic Surgery

## 2022-01-16 NOTE — Patient Instructions (Addendum)
Your procedure is scheduled on: 01/29/22 - Tuesday Report to the Registration Desk on the 1st floor of the [facility]. To find out your arrival time, please call [phone] between 1PM - 3PM on: 01/28/22 - Monday If your arrival time is 6:00 am, do not arrive prior to that time as the [facility] entrance doors do not open until 6:00 am.  REMEMBER: Instructions that are not followed completely Maynor result in serious medical risk, up to and including death; or upon the discretion of your surgeon and anesthesiologist your surgery Sweeting need to be rescheduled.  Do not eat food after midnight the night before surgery.  No gum chewing, lozengers or hard candies.  You Ehlert however, drink CLEAR liquids up to 2 hours before you are scheduled to arrive for your surgery. Do not drink anything within 2 hours of your scheduled arrival time.  Clear liquids include: - water  - apple juice without pulp - gatorade (not RED colors) - black coffee or tea (Do NOT add milk or creamers to the coffee or tea) Do NOT drink anything that is not on this list.  In addition, your doctor has ordered for you to drink the provided  Ensure Pre-Surgery Clear Carbohydrate Drink  Drinking this carbohydrate drink up to two hours before surgery helps to reduce insulin resistance and improve patient outcomes. Please complete drinking 2 hours prior to scheduled arrival time.  TAKE THESE MEDICATIONS THE MORNING OF SURGERY WITH A SIP OF WATER: - ADVAIR DISKUS  - albuterol (VENTOLIN ) - buPROPion (WELLBUTRIN SR)  - busPIRone (BUSPAR) - methocarbamol (ROBAXIN)  - venlafaxine XR (EFFEXOR-XR)  - Vibegron (GEMTESA)    One week prior to surgery: Stop Anti-inflammatories (NSAIDS) such as Advil, Aleve, Ibuprofen, Motrin, Naproxen, Naprosyn and Aspirin based products such as Excedrin, Goodys Powder, BC Powder.  Stop ANY OVER THE COUNTER supplements until after surgery.  You Herrin however, continue to take Tylenol if needed for  pain up until the day of surgery.  No Alcohol for 24 hours before or after surgery.  No Smoking including e-cigarettes for 24 hours prior to surgery.  No chewable tobacco products for at least 6 hours prior to surgery.  No nicotine patches on the day of surgery.  Do not use any "recreational" drugs for at least a week prior to your surgery.  Please be advised that the combination of cocaine and anesthesia Wessels have negative outcomes, up to and including death. If you test positive for cocaine, your surgery will be cancelled.  On the morning of surgery brush your teeth with toothpaste and water, you Rothert rinse your mouth with mouthwash if you wish. Do not swallow any toothpaste or mouthwash.  Use CHG Soap or wipes as directed on instruction sheet.  Do not wear jewelry, make-up, hairpins, clips or nail polish.  Do not wear lotions, powders, or perfumes.   Do not shave body from the neck down 48 hours prior to surgery just in case you cut yourself which could leave a site for infection.  Also, freshly shaved skin Jellison become irritated if using the CHG soap.  Contact lenses, hearing aids and dentures Arviso not be worn into surgery.  Do not bring valuables to the hospital. [facility] is not responsible for any missing/lost belongings or valuables.   Total Shoulder Arthroplasty:  use Benzolyl Peroxide 5% Gel as directed on instruction sheet.  Notify your doctor if there is any change in your medical condition (cold, fever, infection).  Wear  comfortable clothing (specific to your surgery type) to the hospital.  After surgery, you can help prevent lung complications by doing breathing exercises.  Take deep breaths and cough every 1-2 hours. Your doctor Eduardo order a device called an Incentive Spirometer to help you take deep breaths. When coughing or sneezing, hold a pillow firmly against your incision with both hands. This is called "splinting." Doing this helps protect your incision. It also  decreases belly discomfort.  If you are being admitted to the hospital overnight, leave your suitcase in the car. After surgery it Sedlar be brought to your room.  If you are being discharged the day of surgery, you will not be allowed to drive home. You will need a responsible adult (18 years or older) to drive you home and stay with you that night.   If you are taking public transportation, you will need to have a responsible adult (18 years or older) with you. Please confirm with your physician that it is acceptable to use public transportation.   Please call the Pre-admissions Testing Dept. at [phone] if you have any questions about these instructions.  Surgery Visitation Policy:  Patients undergoing a surgery or procedure Archbold have two family members or support persons with them as long as the person is not COVID-19 positive or experiencing its symptoms.   Inpatient Visitation:    Visiting hours are 7 a.m. to 8 p.m. Up to four visitors are allowed at one time in a patient room, including children. The visitors Calandro rotate out with other people during the day. One designated support person (adult) Liebman remain overnight.

## 2022-01-17 ENCOUNTER — Encounter (HOSPITAL_COMMUNITY): Payer: Self-pay | Admitting: Urgent Care

## 2022-01-17 ENCOUNTER — Encounter
Admission: RE | Admit: 2022-01-17 | Discharge: 2022-01-17 | Disposition: A | Discharge status: Home / Self Care | Payer: Medicaid Other | Source: Ambulatory Visit | Attending: Orthopedic Surgery | Admitting: Orthopedic Surgery

## 2022-01-17 ENCOUNTER — Other Ambulatory Visit: Payer: Self-pay

## 2022-01-17 VITALS — BP 142/79 | HR 75 | Resp 16 | Ht 66.0 in | Wt 235.2 lb

## 2022-01-17 DIAGNOSIS — Z01818 Encounter for other preprocedural examination: Secondary | ICD-10-CM | POA: Insufficient documentation

## 2022-01-17 DIAGNOSIS — Z22322 Carrier or suspected carrier of Methicillin resistant Staphylococcus aureus: Secondary | ICD-10-CM

## 2022-01-17 DIAGNOSIS — Z01812 Encounter for preprocedural laboratory examination: Secondary | ICD-10-CM

## 2022-01-17 DIAGNOSIS — Z01810 Encounter for preprocedural cardiovascular examination: Secondary | ICD-10-CM

## 2022-01-17 LAB — CBC WITH DIFFERENTIAL/PLATELET
Abs Immature Granulocytes: 0.01 10*3/uL (ref 0.00–0.07)
Basophils Absolute: 0.1 10*3/uL (ref 0.0–0.1)
Basophils Relative: 1 %
Eosinophils Absolute: 0.1 10*3/uL (ref 0.0–0.5)
Eosinophils Relative: 2 %
HCT: 47.1 % — ABNORMAL HIGH (ref 36.0–46.0)
Hemoglobin: 15.3 g/dL — ABNORMAL HIGH (ref 12.0–15.0)
Immature Granulocytes: 0 %
Lymphocytes Relative: 31 %
Lymphs Abs: 2.3 10*3/uL (ref 0.7–4.0)
MCH: 30.5 pg (ref 26.0–34.0)
MCHC: 32.5 g/dL (ref 30.0–36.0)
MCV: 93.8 fL (ref 80.0–100.0)
Monocytes Absolute: 0.5 10*3/uL (ref 0.1–1.0)
Monocytes Relative: 6 %
Neutro Abs: 4.4 10*3/uL (ref 1.7–7.7)
Neutrophils Relative %: 60 %
Platelets: 244 10*3/uL (ref 150–400)
RBC: 5.02 MIL/uL (ref 3.87–5.11)
RDW: 13 % (ref 11.5–15.5)
WBC: 7.4 10*3/uL (ref 4.0–10.5)
nRBC: 0 % (ref 0.0–0.2)

## 2022-01-17 LAB — COMPREHENSIVE METABOLIC PANEL
ALT: 12 U/L (ref 0–44)
AST: 15 U/L (ref 15–41)
Albumin: 4.5 g/dL (ref 3.5–5.0)
Alkaline Phosphatase: 77 U/L (ref 38–126)
Anion gap: 7 (ref 5–15)
BUN: 17 mg/dL (ref 6–20)
CO2: 28 mmol/L (ref 22–32)
Calcium: 9.5 mg/dL (ref 8.9–10.3)
Chloride: 105 mmol/L (ref 98–111)
Creatinine, Ser: 1.07 mg/dL — ABNORMAL HIGH (ref 0.44–1.00)
GFR, Estimated: >60 mL/min (ref >60)
Glucose, Bld: 118 mg/dL — ABNORMAL HIGH (ref 70–99)
Potassium: 5 mmol/L (ref 3.5–5.1)
Sodium: 140 mmol/L (ref 135–145)
Total Bilirubin: 0.5 mg/dL (ref 0.3–1.2)
Total Protein: 7.5 g/dL (ref 6.5–8.1)

## 2022-01-17 LAB — URINALYSIS, ROUTINE W REFLEX MICROSCOPIC
Bilirubin Urine: NEGATIVE
Glucose, UA: NEGATIVE mg/dL
Hgb urine dipstick: NEGATIVE
Ketones, ur: NEGATIVE mg/dL
Leukocytes,Ua: NEGATIVE
Nitrite: NEGATIVE
Protein, ur: NEGATIVE mg/dL
Specific Gravity, Urine: 1.021 (ref 1.005–1.030)
pH: 5 (ref 5.0–8.0)

## 2022-01-17 LAB — SURGICAL PCR SCREEN
MRSA, PCR: POSITIVE — AB
Staphylococcus aureus: POSITIVE — AB

## 2022-01-17 LAB — TYPE AND SCREEN
ABO/RH(D): AB POS
Antibody Screen: NEGATIVE

## 2022-01-17 MED ORDER — MUPIROCIN 2 % EX OINT: TOPICAL_OINTMENT | CUTANEOUS | 0 refills | Status: DC

## 2022-01-17 NOTE — Progress Notes (Signed)
  Perioperative Services  Abnormal Lab Notification and Treatment Plan of Care  Date: 01/17/22  Name: Shelly Wright MRN:   [id number]  Re: Abnormal labs noted during PAT appointment  Provider Notified: Signa Kell, MD Notification mode: Routed and/or faxed via [facility]  Labs of concern: Lab Results  Component Value Date   STAPHAUREUS POSITIVE (A) 01/17/2022   MRSAPCR POSITIVE (A) 01/17/2022    Notes: Patient is scheduled for a Left reverse shoulder arthroplasty, biceps tenodesis (Left: Shoulder) on 01/29/2022. She is scheduled to receive CEFAZOLIN pre-operatively. Surgical PCR (+) for MRSA; see above.  PLANS:  Review renal function. Estimated Creatinine Clearance: 75.2 mL/min (A) (by C-G formula based on SCr of 1.07 mg/dL (H)). Review allergies. No documented allergy to vancomycin. Order added for VANCOMYCIN 1 GRAM IV to current preoperative prophylactic regimen.  Patient with orders for both CEFAZOLIN + VANCOMYCIN to be given in the setting of documented MRSA (+) surgical PCR.   Guidelines suggest that a beta-lactam antibiotic (first or second generation cephalosporin) should be added for activity against gram-negative organisms.  Vancomycin appears to be less effective than cefazolin for preventing SSIs caused by MSSA. For this reason, the use of vancomycin in combination with cefazolin is favored for prevention of SSI due to MRSA and coagulase-negative staphylococci.  Rx sent to pharmacy as follows per request from surgeon. Patient unable to be reached. LMOM with results and need to pick up Rx from local pharmacy and use as prescribed due to (+) MRSA nasal swab.   Meds ordered this encounter  Medications   mupirocin ointment (BACTROBAN) 2 %    Sig: Apply small (pea size) amount to inside of BILATERAL nares TWICE a day for the next 5 days.    Dispense:  15 g    Refill:  0   Notify primary attending surgeon of (+) MRSA result, Rx for mupirocin sent, and that order has been placed  for the Vancomycin by perioperative APP.   [name], MSN, APRN, FNP-C, CEN [facility]  Peri-operative Services Nurse Practitioner Phone: [phone] 01/17/22 11:49 AM

## 2022-01-18 NOTE — Progress Notes (Signed)
  Perioperative Services Pre-Admission/Anesthesia Testing    Date: 01/18/22  Name: Shelly Wright MRN:   [id number]  Re: GLP-1 clearance and provider recommendations   Planned Surgical Procedure(s):    Case: [id number] Date/Time: 01/29/22 0700   Procedure: Left reverse shoulder arthroplasty, biceps tenodesis (Left: Shoulder)   Anesthesia type: Choice   Pre-op diagnosis: Complete tear of left rotator cuff, unspecified whether traumatic  M75.122   Location: [facility] OR ROOM 01 / [facility] ORS FOR ANESTHESIA GROUP   Surgeons: Signa Kell, MD   Clinical Notes:  Patient is scheduled for the above procedure on 01/29/2022 with Dr. Signa Kell, MD. In review of her medication reconciliation it was noted that patient is on a prescribed GLP-1 medication. Per guidelines issued by the American Society of Anesthesiologists (ASA), it is recommended that these medications be held for 7 days prior to the patient undergoing any type of elective surgical procedure. The patient is taking the following GLP-1 medication:  [x]  SEMAGLUTIDE (Ozempic, Rybelsus)  []  EXENATIDE (Bydureon, Byetta) []  LIRAGLUTIDE (Victoza, Saxenda)  []  LIXISENATIDE (Adalyxin) []  DULAGLUTIDE (Trulicity)    []  OTHER GLP-1 medication: _______________  out to prescribing provider , NP-C) to make them aware of the guidelines from anesthesia. Given that this patient takes the prescribed GLP-1 medication for her  diabetes diagnosis, rather than for weight loss, recommendations from the prescribing provider were solicited. Prescribing provider made aware of the following so that informed decision/POC can be developed for this patient that [place] be taking medications belonging to these drug classes:  Oral GLP-1 medications will be held 1 day prior to surgery.  Injectable GLP-1 medications will be held 7 days prior to surgery.  Metformin is routinely held 48 hours prior to surgery due to renal concerns, potential need for contrasted  imaging perioperatively, and the potential for tissue hypoxia leading to drug induced lactic acidosis.  All SGLT2i medications are held 72 hours prior to surgery as they can be associated with the increased potential for developing euglycemic diabetic ketoacidosis (EDKA).   Impression and Plan:  Shelly Wright is on a prescribed GLP-1 medication, which induces the known side effect of decreased gastric emptying. Efforts are bring made to mitigate the risk of perioperative hyperglycemic events, as elevated blood glucose levels have been found to contribute to intra/postoperative complications. Additionally, hyperglycemic extremes can potentially necessitate the postponing of a patient's elective case in order to better optimize perioperative glycemic control, again with the aforementioned guidelines in place. With this in mind, recommendations have been sought from the prescribing provider, who has cleared patient to proceed with holding the prescribed GLP-1 as per the guidelines from the ASA.   Provider recommending: no further recommendations received from the prescribing provider.  Copy of signed clearance and recommendations placed on patient's chart for inclusion in their medical record and for review by the surgical/anesthetic team on the day of her procedure.   , MSN, APRN, FNP-C, CEN [facility]  Peri-operative Services Nurse Practitioner Phone: [phone] 01/18/22 7:52 AM  NOTE: This note has been prepared using Dragon dictation software. Despite my best ability to proofread, there is always the potential that unintentional transcriptional errors Stennett still occur from this process.

## 2022-01-29 ENCOUNTER — Encounter: Admission: RE | Payer: Self-pay | Source: Home / Self Care

## 2022-01-29 ENCOUNTER — Ambulatory Visit: Admission: RE | Admit: 2022-01-29 | Payer: Medicaid Other | Source: Home / Self Care | Admitting: Orthopedic Surgery

## 2022-01-29 SURGERY — ARTHROPLASTY, SHOULDER, TOTAL, REVERSE
Anesthesia: Choice | Site: Shoulder | Laterality: Left

## 2022-04-08 ENCOUNTER — Ambulatory Visit: Payer: Medicaid Other | Admitting: Psychiatry

## 2022-07-08 IMAGING — CT CT SHOULDER*L* W/O CM
2 of 3 series · 9 of 14 positions shown, 10 images · non-contrast
Comparison: 12/16/2007

CLINICAL DATA: Left shoulder pain and limited range of motion

EXAM:
CT OF THE UPPER LEFT EXTREMITY WITHOUT CONTRAST
TECHNIQUE: Multidetector CT imaging of the upper left extremity was performed
according to the standard protocol.
RADIATION DOSE REDUCTION: This exam was performed according to the
departmental dose-optimization program which includes automated
exposure control, adjustment of the mA and/or kV according to
patient size and/or use of iterative reconstruction technique.

[Series 3: thin bone · axial · 0.49mm/px · z∈[-487,-338]mm · 6 of 419 slices shown]
[im 60/419  bone]
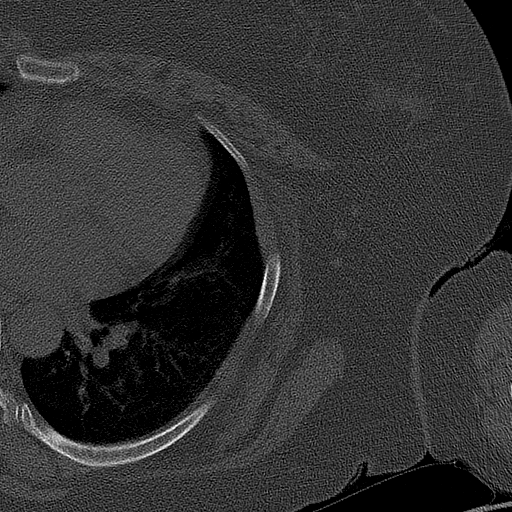
[im 120/419  bone]
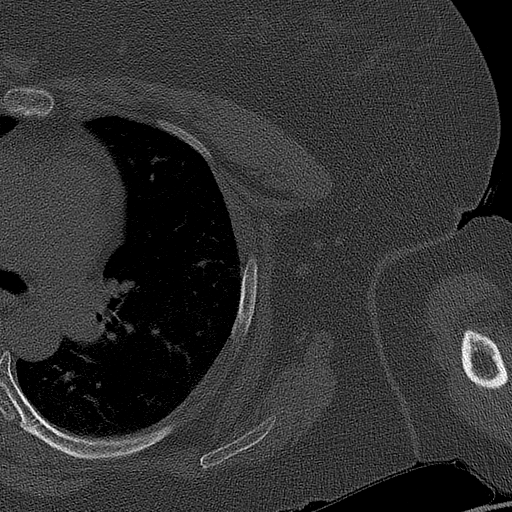
[im 180/419  bone]
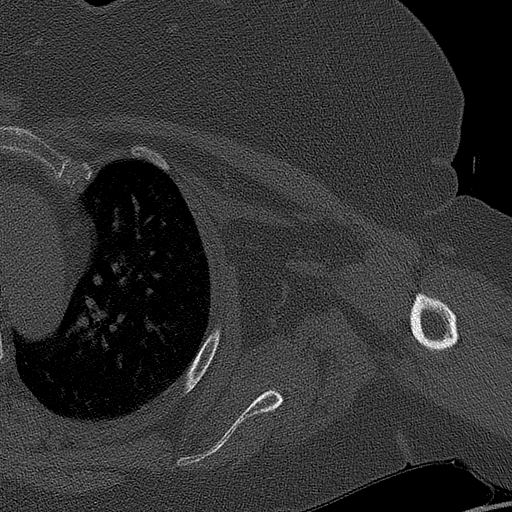
[im 239/419  bone]
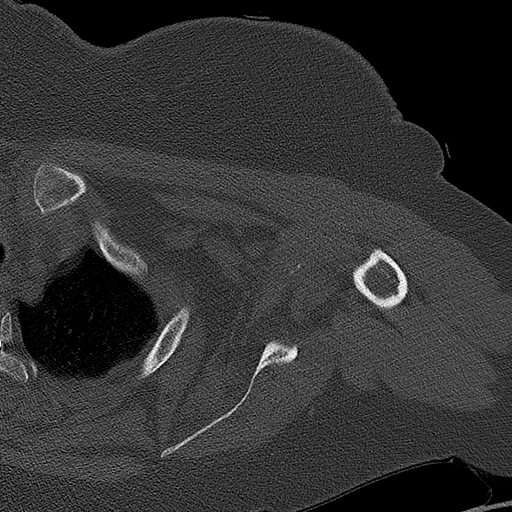
[im 299/419  bone]
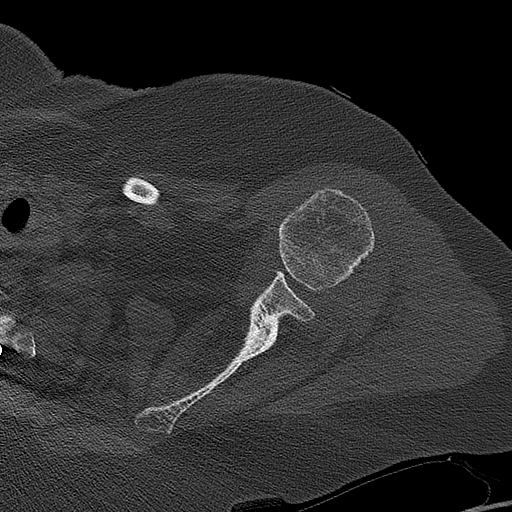
[im 359/419  bone]
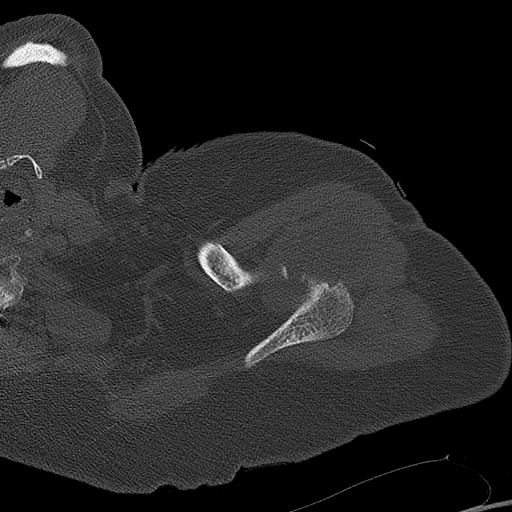

[Series 4: ax bone · axial · 0.44mm/px · z∈[-600,-343]mm · 3 of 147 slices shown, 4 images]
[im 1/147  soft-tissue]
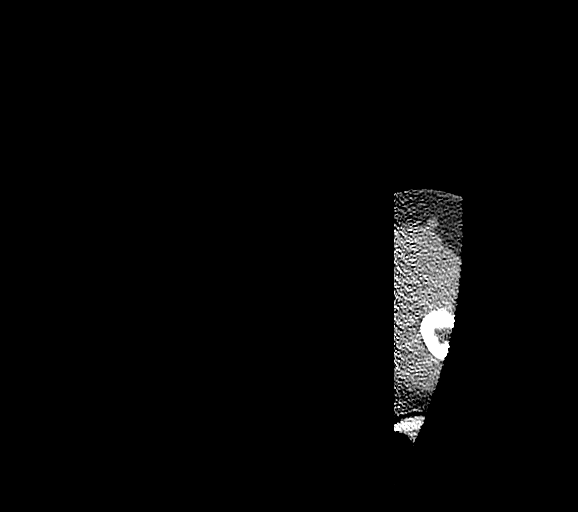
[im 1/147  bone]
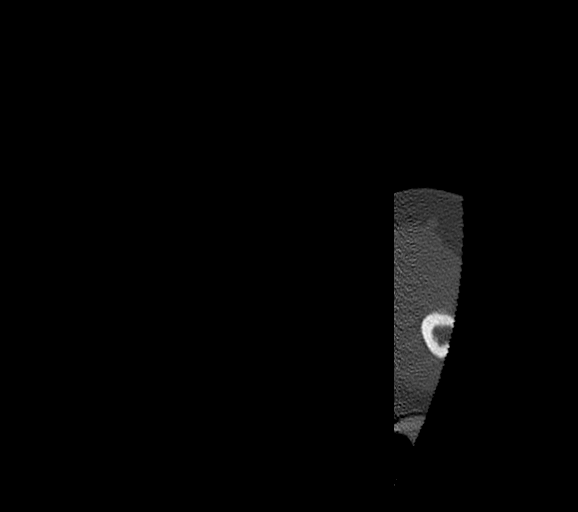
[im 74/147  bone]
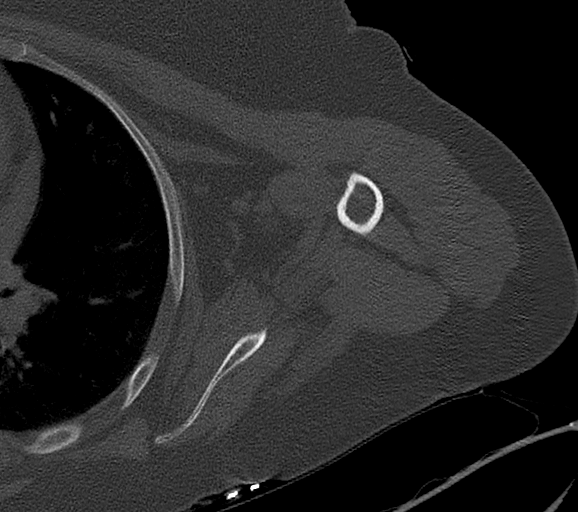
[im 147/147  bone]
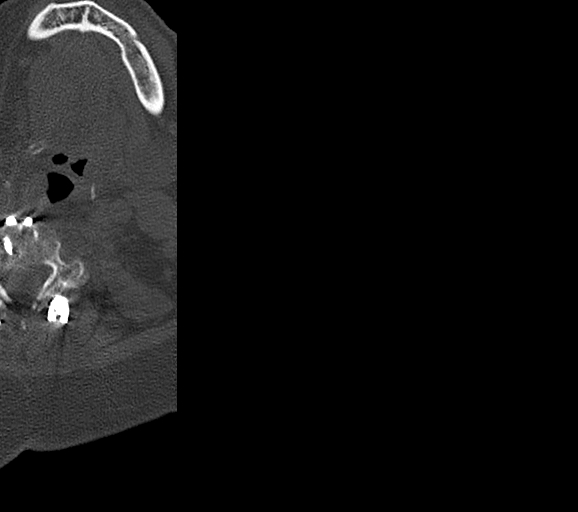

[9 of 14 positions shown; findings below may reference images not displayed]

FINDINGS: Bones/Joint/Cartilage

No acute fracture. No dislocation. Moderate-severe glenohumeral
joint osteoarthritis manifested by joint space narrowing,
subchondral sclerosis/cystic change, and marginal osteophyte
formation. Humeral head is high-riding relative to the glenoid and
anteriorly translated with humeral head abutting the coracoid
process. A large glenohumeral joint effusion is evident.

Moderate degenerative changes of the acromioclavicular joint.
Chronic appearing fragmentation of the tip of the acromion process.
Large amount of fluid within the subacromial-subdeltoid bursa,
likely communicating with the glenohumeral joint. Remaining
visualized osseous structures appear grossly intact.

Ligaments

Suboptimally assessed by CT.

Muscles and Tendons

Presumed complete subscapularis tendon tear based on anterior
translation of the humeral head. Supraspinatus and infraspinatus
tendons are also presumably torn given elevation of the humeral
head. Subscapularis muscle is atrophic. Supraspinatus muscle is
mildly atrophic.

Soft tissues

No soft tissue fluid collection or hematoma. No axillary
lymphadenopathy. Visualized lung field is clear.
IMPRESSION: 1. Moderate-severe glenohumeral joint osteoarthritis with large
joint effusion and subacromial-subdeltoid bursitis.
2. Presumed massive rotator cuff tears.

## 2022-09-09 ENCOUNTER — Telehealth: Payer: Self-pay | Admitting: Psychiatry

## 2022-09-09 DIAGNOSIS — F3178 Bipolar disorder, in full remission, most recent episode mixed: Secondary | ICD-10-CM

## 2022-09-09 DIAGNOSIS — F410 Panic disorder [episodic paroxysmal anxiety] without agoraphobia: Secondary | ICD-10-CM

## 2022-09-09 DIAGNOSIS — F411 Generalized anxiety disorder: Secondary | ICD-10-CM

## 2022-09-09 MED ORDER — TRAZODONE HCL 100 MG PO TABS: ORAL_TABLET | ORAL | 0 refills | Status: DC

## 2022-09-09 MED ORDER — VENLAFAXINE HCL ER 37.5 MG PO CP24: ORAL_CAPSULE | ORAL | 0 refills | Status: DC

## 2022-09-09 MED ORDER — VENLAFAXINE HCL ER 150 MG PO CP24: ORAL_CAPSULE | ORAL | 0 refills | Status: DC

## 2022-09-09 MED ORDER — BUSPIRONE HCL 30 MG PO TABS: 30.0000 mg | ORAL_TABLET | Freq: Two times a day (BID) | ORAL | 0 refills | Status: DC

## 2022-09-09 NOTE — Telephone Encounter (Signed)
Patient called to schedule appointment and mentioned she needs med refills, she is scheduled for June. Please advised

## 2022-09-09 NOTE — Telephone Encounter (Signed)
Called to inform patient of the message  no answer left voicemail for patient to return call to office 

## 2022-09-09 NOTE — Telephone Encounter (Signed)
I have sent venlafaxine, BuSpar, [facility].  No future refills will be provided without keeping the appointment.

## 2022-09-10 ENCOUNTER — Encounter: Payer: Self-pay | Admitting: Psychiatry

## 2022-09-10 ENCOUNTER — Ambulatory Visit (INDEPENDENT_AMBULATORY_CARE_PROVIDER_SITE_OTHER): Payer: Medicaid Other | Admitting: Psychiatry

## 2022-09-10 VITALS — BP 137/76 | HR 76 | Temp 98.1°F | Ht 66.0 in | Wt 254.8 lb

## 2022-09-10 DIAGNOSIS — F410 Panic disorder [episodic paroxysmal anxiety] without agoraphobia: Secondary | ICD-10-CM | POA: Diagnosis not present

## 2022-09-10 DIAGNOSIS — F17200 Nicotine dependence, unspecified, uncomplicated: Secondary | ICD-10-CM

## 2022-09-10 DIAGNOSIS — F1220 Cannabis dependence, uncomplicated: Secondary | ICD-10-CM | POA: Diagnosis not present

## 2022-09-10 DIAGNOSIS — F3178 Bipolar disorder, in full remission, most recent episode mixed: Secondary | ICD-10-CM | POA: Diagnosis not present

## 2022-09-10 DIAGNOSIS — F411 Generalized anxiety disorder: Secondary | ICD-10-CM | POA: Diagnosis not present

## 2022-09-10 DIAGNOSIS — Z79899 Other long term (current) drug therapy: Secondary | ICD-10-CM

## 2022-09-10 NOTE — Progress Notes (Unsigned)
[facility] MD OP Progress Note  09/10/2022 5:04 PM Shelly Wright  MRN:  [id number]  Chief Complaint:  Chief Complaint  Patient presents with   Follow-up   Anxiety   mood swings   Medication Refill   HPI: Shelly Wright is a 52 year old Caucasian female on SSI, has a history of bipolar disorder, GAD, panic disorder, cannabis use disorder, tobacco use disorder, married, lives in [place], was evaluated in office today.  Patient's last visit was on 01/07/2022.  Patient at that visit was advised to follow-up in 3 months.  Patient however today returns reporting that she had relocated to [place] for 7 months.  Patient reports she did not want to stay there anymore and hence decided to return to her spouse here in [place].  Patient reports when she was at [place] she was seen by a provider who prescribed her her medications.  Patient reports she was prescribed Abilify as well as a higher dosage of venlafaxine while in [place].  Patient reports as long as she is compliant on her medications her mood symptoms are okay.    Patient continues to be in a lot of pain.  She is planning to get an appointment with her primary provider or a pain provider to get back on medications to manage it.  Patient reports she has been using cannabis, a bowl on and off.  Did not elaborate further.  Patient reports she will not be able to do a urine drug screen today since she will be positive for cannabis.  She agrees to stay away from cannabis and get it done soon.  Patient also with a history of cardiac problems was advised to follow-up with cardiology once a year.  She missed her appointment in the summer 2023 since she was in [place].  Increase reach out.  Patient currently denies any suicidality, homicidality or perceptual disturbances.  Patient denies any other concerns today. Visit Diagnosis:    ICD-10-CM   1. Bipolar disorder, in full remission, most recent episode mixed  F31.78     2. GAD (generalized  anxiety disorder)  F41.1     3. Panic disorder  F41.0     4. Cannabis use disorder, moderate, dependence  F12.20 Urine drugs of abuse scrn w alc, routine (Ref Lab)    5. Tobacco use disorder  F17.200     6. High risk medication use  Z79.899 Urine drugs of abuse scrn w alc, routine (Ref Lab)      Past Psychiatric History: I have reviewed past psychiatric history from progress note on 09/16/2018.  Past trials of venlafaxine, trazodone, Seroquel, hydroxyzine, Zoloft, Lexapro, duloxetine, Paxil, Abilify, Wellbutrin  Past Medical History:  Past Medical History:  Diagnosis Date   Acute right-sided low back pain with right-sided sciatica 08/30/2014   Anxiety    Arthritis    Asthma    in past, no current inhalers   Bipolar disorder    Cervical spinal cord compression 05/07/2012   COPD (chronic obstructive pulmonary disease)    DDD (degenerative disc disease), lumbar    Depression    Dyspnea    with exertion   Dysrhythmia    irregular-h/o in the past   Fibromyalgia    Hypertension    Neuritis or radiculitis due to rupture of lumbar intervertebral disc 05/23/2014   Periprosthetic fracture around prosthetic knee 07/31/2015   Restless leg syndrome    Soft tissue lesion of shoulder region 01/24/2012   Status post revision of total replacement of  right knee 09/18/2015   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   Syncope and collapse 04/08/2014   Thoracic and lumbosacral neuritis 05/07/2012   Thoracic neuritis 05/07/2012    Past Surgical History:  Procedure Laterality Date   ABDOMINAL HYSTERECTOMY     Cervical Fusion X 2     JOINT REPLACEMENT Right 01/2016   Dr Gavin Potters   KNEE ARTHROPLASTY Left 02/10/2019   Procedure: COMPUTER ASSISTED TOTAL KNEE ARTHROPLASTY - RNFA;  Surgeon: Donato Heinz, MD;  Location: [facility];  Service: Orthopedics;  Laterality: Left;   KNEE ARTHROSCOPY Bilateral    Right knee scope 1998, Left knee scope   REVERSE SHOULDER ARTHROPLASTY Right  03/12/2021   Procedure: Right reverse shoulder arthroplasty, biceps tenodesis;  Surgeon: Signa Kell, MD;  Location: [facility];  Service: Orthopedics;  Laterality: Right;   SHOULDER ARTHROSCOPY WITH SUBACROMIAL DECOMPRESSION AND OPEN ROTATOR C Left 06/18/2021   Procedure: Left shoulder arthroscopy, Open rotator cuff repair, subacromial decompression, distal clavicle excision and, open biceps tenodesis;  Surgeon: Signa Kell, MD;  Location: [facility];  Service: Orthopedics;  Laterality: Left;   TONSILLECTOMY     TOTAL KNEE ARTHROPLASTY Right 02/01/2015   Procedure: TOTAL KNEE ARTHROPLASTY;  Surgeon: Erin Sons, MD;  Location: [facility];  Service: Orthopedics;  Laterality: Right;   TUBAL LIGATION      Family Psychiatric History: Reviewed family psychiatric history from progress note on 09/16/2018.  Family History:  Family History  Problem Relation Age of Onset   Breast cancer Mother 36   Colon cancer Father    Heart attack Father    Bipolar disorder Sister    Depression Sister    Schizophrenia Sister     Social History: Review of social history from progress note on 09/16/2018. Social History   Socioeconomic History   Marital status: Married    Spouse name: Shelly Wright   Number of children: 1   Years of education: Not on file   Highest education level: High school graduate  Occupational History   Not on file  Tobacco Use   Smoking status: Every Day    Packs/day: 1.00    Years: 30.00    Additional pack years: 0.00    Total pack years: 30.00    Types: Cigarettes    Last attempt to quit: 09/11/2021    Years since quitting: 0.9   Smokeless tobacco: Never  Vaping Use   Vaping Use: Never used  Substance and Sexual Activity   Alcohol use: No   Drug use: Yes    Types: Marijuana    Comment: every day   Sexual activity: Not Currently  Other Topics Concern   Not on file  Social History Narrative   Not on file   Social Determinants of Health   Financial Resource Strain: Low  Risk  (09/16/2018)   Overall Financial Resource Strain (CARDIA)    Difficulty of Paying Living Expenses: Not hard at all  Food Insecurity: No Food Insecurity (09/16/2018)   Hunger Vital Sign    Worried About Running Out of Food in the Last Year: Never true    Ran Out of Food in the Last Year: Never true  Transportation Needs: No Transportation Needs (09/16/2018)   PRAPARE - Administrator, Civil Service (Medical): No    Lack of Transportation (Non-Medical): No  Physical Activity: Inactive (09/16/2018)   Exercise Vital Sign    Days of Exercise per Week: 0 days    Minutes of Exercise per Session: 0 min  Stress: Stress Concern Present (09/16/2018)   [employer] of Occupational Health - Occupational Stress Questionnaire    Feeling of Stress : To some extent  Social Connections: Unknown (09/16/2018)   Social Connection and Isolation Panel [NHANES]    Frequency of Communication with Friends and Family: Not on file    Frequency of Social Gatherings with Friends and Family: Not on file    Attends Religious Services: Never    Active Member of Clubs or Organizations: No    Attends Engineer, structural: Never    Marital Status: Separated    Allergies: No Known Allergies  Metabolic Disorder Labs: Lab Results  Component Value Date   HGBA1C 6.2 (H) 09/26/2020   MPG 131.24 09/26/2020   Lab Results  Component Value Date   PROLACTIN 6.4 09/26/2020   Lab Results  Component Value Date   CHOL 164 09/26/2020   TRIG 99 09/26/2020   HDL 44 09/26/2020   CHOLHDL 3.7 09/26/2020   VLDL 20 09/26/2020   LDLCALC 100 (H) 09/26/2020   Lab Results  Component Value Date   TSH 0.696 09/26/2020    Therapeutic Level Labs: No results found for: "LITHIUM" No results found for: "VALPROATE" No results found for: "CBMZ"  Current Medications: Current Outpatient Medications  Medication Sig Dispense Refill   ADVAIR DISKUS 250-50 MCG/DOSE AEPB Inhale 1 puff into the lungs daily.      albuterol (VENTOLIN HFA) 108 (90 Base) MCG/ACT inhaler Inhale 2 puffs into the lungs every 6 (six) hours as needed for wheezing or shortness of breath.     amLODipine-benazepril (LOTREL) 5-20 MG capsule Take 1 capsule by mouth daily.     busPIRone (BUSPAR) 30 MG tablet Take 1 tablet (30 mg total) by mouth 2 (two) times daily. 180 tablet 0   cyclobenzaprine (FLEXERIL) 10 MG tablet Take 10 mg by mouth 3 (three) times daily as needed for muscle spasms.     HYDROcodone-acetaminophen (NORCO) 10-325 MG tablet Take 1 tablet by mouth every 8 (eight) hours as needed.     lidocaine (XYLOCAINE) 5 % ointment Apply 1 application topically as needed for moderate pain or mild pain.     meloxicam (MOBIC) 15 MG tablet Take 15 mg by mouth daily as needed.     mupirocin ointment (BACTROBAN) 2 % Apply small (pea size) amount to inside of BILATERAL nares TWICE a day for the next 5 days. 15 g 0   Semaglutide,0.25 or 0.5MG /DOS, (OZEMPIC, 0.25 OR 0.5 MG/DOSE,) 2 MG/1.5ML SOPN Inject into the skin once a week.     simvastatin (ZOCOR) 20 MG tablet Take 20 mg by mouth at bedtime.      traZODone (DESYREL) 100 MG tablet TAKE 2 TABLETS BY MOUTH AT BEDTIME AS NEEDED FOR SLEEP 180 tablet 0   venlafaxine XR (EFFEXOR-XR) 150 MG 24 hr capsule TAKE 1 CAPSULE BY MOUTH DAILY ALONG WITHTHE 37.5MG  CAPSULE 90 capsule 0   venlafaxine XR (EFFEXOR-XR) 75 MG 24 hr capsule Take 75 mg by mouth daily.     gabapentin (NEURONTIN) 800 MG tablet Take 1 tablet (800 mg total) by mouth every 6 (six) hours. (Patient taking differently: Take 800 mg by mouth every 6 (six) hours as needed (pain.).) 120 tablet 5   No current facility-administered medications for this visit.     Musculoskeletal: Strength & Muscle Tone: within normal limits Gait & Station:  walks with cane Patient leans: N/A  Psychiatric Specialty Exam: Review of Systems  Musculoskeletal:  Positive for back pain.  Psychiatric/Behavioral:  The patient is nervous/anxious.         Mood swings  All other systems reviewed and are negative.   Blood pressure 137/76, pulse 76, temperature 98.1 F (36.7 C), temperature source Skin, height  (1.676 m), weight 254 lb 12.8 oz (115.6 kg).Body mass index is 41.13 kg/m.  General Appearance: Casual  Eye Contact:  Fair  Speech:  Clear and Coherent  Volume:  Normal  Mood:  Anxious and mood swings improving on medications  Affect:  Appropriate  Thought Process:  Goal Directed and Descriptions of Associations: Intact  Orientation:  Full (Time, Place, and Person)  Thought Content: Logical   Suicidal Thoughts:  No  Homicidal Thoughts:  No  Memory:  Immediate;   Fair Recent;   Fair Remote;   Fair  Judgement:  Other:  limited  Insight:  Shallow  Psychomotor Activity:  Normal  Concentration:  Concentration: Fair and Attention Span: Fair  Recall:  [employer] of Knowledge: Fair  Language: Fair  Akathisia:  No  Handed:  Right  AIMS (if indicated): not done  Assets:  Communication Skills Desire for Improvement Housing Social Support  ADL's:  Intact  Cognition: WNL  Sleep:  Fair   Screenings: Midwife Visit from 01/07/2022 in [facility] Office Visit from 10/05/2021 in [facility] Visit from 11/27/2020 in [facility] Visit from 09/26/2020 in [facility]  AIMS Total Score 0 0 0 0      GAD-7    Flowsheet Row Office Visit from 01/07/2022 in [facility] Office Visit from 08/14/2021 in [facility] Office Visit from 11/27/2020 in [facility] Office Visit from 09/26/2020 in [facility]  Total GAD-7 Score 4 0 4 5      PHQ2-9    Flowsheet Row Office Visit from 01/07/2022 in  [facility] Office Visit from 10/05/2021 in [facility] Office Visit from 05/07/2021 in [facility] Office Visit from 02/13/2021 in [facility] Office Visit from 11/27/2020 in [facility]  PHQ-2 Total Score 1 2 0 2 2  PHQ-9 Total Score -- 4 -- 3 4      Flowsheet Row Office Visit from 01/07/2022 in [facility] Office Visit from 10/05/2021 in [facility] Pre-Admission Testing 43 from 06/12/2021 in [facility]ION TEST[facility]  C-SSRS RISK CATEGORY No Risk No Risk No Risk        Assessment and Plan: Khyla Mccumbers Baker is a 52 year old Caucasian female on SSI, history of bipolar disorder, tobacco use disorder, noncompliance with follow-up appointments recently relocated back from [place], presents today to reestablish care.  Patient with continued cannabis use, as well as significant back pain, with continued mood symptoms although with good response to medications, will benefit from the following plan.  Plan Bipolar disorder intermission most recent episode mixed Will not refill Abilify which she reports was prescribed by a provider in [place]. Patient advised to sign an ROI to obtain medical records. Patient currently using cannabis, discussed  interaction with medications and cannabis, advised to stop using cannabis.  Patient will also need an EKG prior to her considering  prescribing Abilify.  Patient agrees to contact cardiology for an appointment. Continue gabapentin 800 mg 4 times a day.  Although prescribed for pain is also a mood stabilizer.  GAD/panic disorder-improving Increase venlafaxine extended release 225 mg p.o. daily BuSpar 30 mg p.o. twice daily   Cannabis use disorder  moderate-unstable Provided counseling.  Tobacco use disorder-unstable Will monitor closely  High risk medication use-patient will benefit from labs including TSH, lipid panel, hemoglobin A1c, prolactin, CMP, CBC.  Patient agrees to sign an ROI to obtain most recent medical records from [place]. Patient also agrees to get in touch with cardiology to repeat EKG. Will order a urine drug screen-patient provided lab slip. Once we are able to get records and get all these labs and EKG will consider starting an atypical antipsychotic medication as needed.  This was discussed with the patient.  Patient also encouraged to get establish care with a provider for management of pain.  Follow-up in clinic in 1 month or sooner if needed.  Collaboration of Care: Collaboration of Care: Other patient signed an ROI to obtain medical records from [place].  Patient/Guardian was advised Release of Information must be obtained prior to any record release in order to collaborate their care with an outside provider. Patient/Guardian was advised if they have not already done so to contact the registration department to sign all necessary forms in order for [place] to release information regarding their care.   Consent: Patient/Guardian gives verbal consent for treatment and assignment of benefits for services provided during this visit. Patient/Guardian expressed understanding and agreed to proceed.   This note was generated in part or whole with voice recognition software. Voice recognition is usually quite accurate but there are transcription errors that can and very often do occur. I apologize for any typographical errors that were not detected and corrected.    Jomarie Longs, MD 09/10/2022, 5:04 PM

## 2022-09-12 ENCOUNTER — Ambulatory Visit: Payer: Medicaid Other | Admitting: Family

## 2022-09-12 VITALS — BP 138/70 | HR 102 | Ht 67.0 in | Wt 256.0 lb

## 2022-09-12 DIAGNOSIS — F3162 Bipolar disorder, current episode mixed, moderate: Secondary | ICD-10-CM

## 2022-09-12 DIAGNOSIS — M797 Fibromyalgia: Secondary | ICD-10-CM | POA: Diagnosis not present

## 2022-09-12 DIAGNOSIS — M792 Neuralgia and neuritis, unspecified: Secondary | ICD-10-CM | POA: Diagnosis not present

## 2022-09-12 DIAGNOSIS — E559 Vitamin D deficiency, unspecified: Secondary | ICD-10-CM

## 2022-09-12 DIAGNOSIS — F410 Panic disorder [episodic paroxysmal anxiety] without agoraphobia: Secondary | ICD-10-CM | POA: Diagnosis not present

## 2022-09-12 DIAGNOSIS — F411 Generalized anxiety disorder: Secondary | ICD-10-CM | POA: Diagnosis not present

## 2022-09-12 DIAGNOSIS — E782 Mixed hyperlipidemia: Secondary | ICD-10-CM

## 2022-09-12 DIAGNOSIS — I493 Ventricular premature depolarization: Secondary | ICD-10-CM

## 2022-09-12 DIAGNOSIS — M4696 Unspecified inflammatory spondylopathy, lumbar region: Secondary | ICD-10-CM

## 2022-09-12 DIAGNOSIS — E538 Deficiency of other specified B group vitamins: Secondary | ICD-10-CM

## 2022-09-12 DIAGNOSIS — G894 Chronic pain syndrome: Secondary | ICD-10-CM

## 2022-09-12 DIAGNOSIS — R7303 Prediabetes: Secondary | ICD-10-CM

## 2022-09-12 DIAGNOSIS — R5383 Other fatigue: Secondary | ICD-10-CM

## 2022-09-12 MED ORDER — FEXOFENADINE HCL 180 MG PO TABS: 180.0000 mg | ORAL_TABLET | Freq: Every day | ORAL | 1 refills | Status: DC

## 2022-09-12 MED ORDER — AMLODIPINE BESY-BENAZEPRIL HCL 5-20 MG PO CAPS
1.0000 | ORAL_CAPSULE | Freq: Every day | ORAL | 1 refills | Status: DC
Start: 2022-09-12 — End: 2022-12-18

## 2022-09-12 MED ORDER — SEMAGLUTIDE(0.25 OR 0.5MG/DOS) 2 MG/3ML ~~LOC~~ SOPN: 0.2500 mg | PEN_INJECTOR | SUBCUTANEOUS | 3 refills | Status: DC

## 2022-09-12 MED ORDER — ALBUTEROL SULFATE HFA 108 (90 BASE) MCG/ACT IN AERS
2.0000 | INHALATION_SPRAY | Freq: Four times a day (QID) | RESPIRATORY_TRACT | 5 refills | Status: DC | PRN
Start: 2022-09-12 — End: 2023-11-19

## 2022-09-12 MED ORDER — LIDOCAINE 5 % EX OINT: 1.0000 | TOPICAL_OINTMENT | CUTANEOUS | 5 refills | Status: DC | PRN

## 2022-09-12 MED ORDER — HYDROCODONE-ACETAMINOPHEN 10-325 MG PO TABS: 1.0000 | ORAL_TABLET | Freq: Three times a day (TID) | ORAL | 0 refills | Status: DC | PRN

## 2022-09-12 MED ORDER — GABAPENTIN 800 MG PO TABS
800.0000 mg | ORAL_TABLET | Freq: Three times a day (TID) | ORAL | 5 refills | Status: DC
Start: 2022-09-12 — End: 2023-04-04

## 2022-09-12 MED ORDER — CYCLOBENZAPRINE HCL 10 MG PO TABS: 10.0000 mg | ORAL_TABLET | Freq: Three times a day (TID) | ORAL | 1 refills | Status: DC | PRN

## 2022-09-12 MED ORDER — SIMVASTATIN 20 MG PO TABS: 20.0000 mg | ORAL_TABLET | Freq: Every day | ORAL | 1 refills | Status: DC

## 2022-09-12 MED ORDER — MELOXICAM 15 MG PO TABS: 15.0000 mg | ORAL_TABLET | Freq: Every day | ORAL | 1 refills | Status: DC | PRN

## 2022-09-12 NOTE — Progress Notes (Cosign Needed)
Established Patient Office Visit  Subjective:  Patient ID: Shelly Wright, female    DOB: May 28, 1970  Age: 52 y.o. MRN: [id number]  Chief Complaint  Patient presents with   Follow-up    Med refills    Patient is here for her follow up appointment.  She moved back from [place] again and is needing refills on her medications. .  I have made the patient aware of the changes to prescribing practices, and she is in agreement that she will be set up with a pain clinic. I have agreed only to cover her pain medication until then.   Needs labs   No other concerns at this time.   Past Medical History:  Diagnosis Date   Acute right-sided low back pain with right-sided sciatica 08/30/2014   Anxiety    Arthritis    Asthma    in past, no current inhalers   Bipolar disorder (HCC)    Cervical spinal cord compression (HCC) 05/07/2012   COPD (chronic obstructive pulmonary disease) (HCC)    DDD (degenerative disc disease), lumbar    Depression    Dyspnea    with exertion   Dysrhythmia    irregular-h/o in the past   Fibromyalgia    Hypertension    Neuritis or radiculitis due to rupture of lumbar intervertebral disc 05/23/2014   Periprosthetic fracture around prosthetic knee 07/31/2015   Restless leg syndrome    Soft tissue lesion of shoulder region 01/24/2012   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   Syncope and collapse 04/08/2014   Thoracic and lumbosacral neuritis 05/07/2012   Thoracic neuritis 05/07/2012    Past Surgical History:  Procedure Laterality Date   ABDOMINAL HYSTERECTOMY     Cervical Fusion X 2     JOINT REPLACEMENT Right 01/2016   Dr Gavin Potters   KNEE ARTHROPLASTY Left 02/10/2019   Procedure: COMPUTER ASSISTED TOTAL KNEE ARTHROPLASTY - RNFA;  Surgeon: Donato Heinz, MD;  Location: [facility];  Service: Orthopedics;  Laterality: Left;   KNEE ARTHROSCOPY Bilateral    Right knee scope 1998,  Left knee scope   REVERSE SHOULDER ARTHROPLASTY Right 03/12/2021   Procedure: Right reverse shoulder arthroplasty, biceps tenodesis;  Surgeon: Signa Kell, MD;  Location: [facility];  Service: Orthopedics;  Laterality: Right;   SHOULDER ARTHROSCOPY WITH SUBACROMIAL DECOMPRESSION AND OPEN ROTATOR C Left 06/18/2021   Procedure: Left shoulder arthroscopy, Open rotator cuff repair, subacromial decompression, distal clavicle excision and, open biceps tenodesis;  Surgeon: Signa Kell, MD;  Location: [facility];  Service: Orthopedics;  Laterality: Left;   TONSILLECTOMY     TOTAL KNEE ARTHROPLASTY Right 02/01/2015   Procedure: TOTAL KNEE ARTHROPLASTY;  Surgeon: Erin Sons, MD;  Location: [facility];  Service: Orthopedics;  Laterality: Right;   TUBAL LIGATION      Social History   Socioeconomic History   Marital status: Married    Spouse name: Molly Maduro   Number of children: 1   Years of education: Not on file   Highest education level: High school graduate  Occupational History   Not on file  Tobacco Use   Smoking status: Every Day    Packs/day: 1.00    Years: 30.00    Additional pack years: 0.00    Total pack years: 30.00    Types: Cigarettes    Last attempt to quit: 09/11/2021    Years since quitting: 1.0   Smokeless tobacco: Never  Vaping Use  Vaping Use: Never used  Substance and Sexual Activity   Alcohol use: No   Drug use: Yes    Types: Marijuana    Comment: every day   Sexual activity: Not Currently  Other Topics Concern   Not on file  Social History Narrative   Not on file   Social Determinants of Health   Financial Resource Strain: Low Risk  (09/16/2018)   Overall Financial Resource Strain (CARDIA)    Difficulty of Paying Living Expenses: Not hard at all  Food Insecurity: No Food Insecurity (09/16/2018)   Hunger Vital Sign    Worried About Running Out of Food in the Last Year: Never true    Ran Out of Food in the Last Year: Never true  Transportation Needs: No  Transportation Needs (09/16/2018)   PRAPARE - Administrator, Civil Service (Medical): No    Lack of Transportation (Non-Medical): No  Physical Activity: Inactive (09/16/2018)   Exercise Vital Sign    Days of Exercise per Week: 0 days    Minutes of Exercise per Session: 0 min  Stress: Stress Concern Present (09/16/2018)   [employer] of Occupational Health - Occupational Stress Questionnaire    Feeling of Stress : To some extent  Social Connections: Unknown (09/16/2018)   Social Connection and Isolation Panel [NHANES]    Frequency of Communication with Friends and Family: Not on file    Frequency of Social Gatherings with Friends and Family: Not on file    Attends Religious Services: Never    Active Member of Clubs or Organizations: No    Attends Banker Meetings: Never    Marital Status: Separated  Intimate Partner Violence: Not At Risk (09/16/2018)   Humiliation, Afraid, Rape, and Kick questionnaire    Fear of Current or Ex-Partner: No    Emotionally Abused: No    Physically Abused: No    Sexually Abused: No    Family History  Problem Relation Age of Onset   Breast cancer Mother 47   Colon cancer Father    Heart attack Father    Bipolar disorder Sister    Depression Sister    Schizophrenia Sister     No Known Allergies  Review of Systems  All other systems reviewed and are negative.      Objective:   BP 138/70   Pulse (!) 102   Ht 5\' 7"  (1.702 m)   Wt 256 lb (116.1 kg)   SpO2 94%   BMI 40.10 kg/m   Vitals:   09/12/22 1432  BP: 138/70  Pulse: (!) 102  Height: 5\' 7"  (1.702 m)  Weight: 256 lb (116.1 kg)  SpO2: 94%  BMI (Calculated): 40.09    Physical Exam Vitals and nursing note reviewed.  Constitutional:      Appearance: Normal appearance. She is normal weight.  HENT:     Head: Normocephalic.  Eyes:     Pupils: Pupils are equal, round, and reactive to light.  Cardiovascular:     Rate and Rhythm: Normal rate.   Pulmonary:     Effort: Pulmonary effort is normal.  Neurological:     Mental Status: She is alert.      Results for orders placed or performed in visit on 09/12/22  Lipid panel  Result Value Ref Range   Cholesterol, Total 168 100 - 199 mg/dL   Triglycerides 409 0 - 149 mg/dL   HDL 52 >81 mg/dL   VLDL Cholesterol Cal 22 5 - 40 mg/dL  LDL Chol Calc (NIH) 94 0 - 99 mg/dL   Chol/HDL Ratio 3.2 0.0 - 4.4 ratio  VITAMIN D 25 Hydroxy (Vit-D Deficiency, Fractures)  Result Value Ref Range   Vit D, 25-Hydroxy 37.9 30.0 - 100.0 ng/mL  CBC With Differential  Result Value Ref Range   WBC 8.2 3.4 - 10.8 x10E3/uL   RBC 4.59 3.77 - 5.28 x10E6/uL   Hemoglobin 13.7 11.1 - 15.9 g/dL   Hematocrit 16.1 09.6 - 46.6 %   MCV 90 79 - 97 fL   MCH 29.8 26.6 - 33.0 pg   MCHC 33.1 31.5 - 35.7 g/dL   RDW 04.5 40.9 - 81.1 %   Neutrophils 60 Not Estab. %   Lymphs 30 Not Estab. %   Monocytes 7 Not Estab. %   Eos 2 Not Estab. %   Basos 1 Not Estab. %   Neutrophils Absolute 4.9 1.4 - 7.0 x10E3/uL   Lymphocytes Absolute 2.5 0.7 - 3.1 x10E3/uL   Monocytes Absolute 0.6 0.1 - 0.9 x10E3/uL   EOS (ABSOLUTE) 0.1 0.0 - 0.4 x10E3/uL   Basophils Absolute 0.1 0.0 - 0.2 x10E3/uL   Immature Granulocytes 0 Not Estab. %   Immature Grans (Abs) 0.0 0.0 - 0.1 x10E3/uL  CMP14+EGFR  Result Value Ref Range   Glucose 85 70 - 99 mg/dL   BUN 22 6 - 24 mg/dL   Creatinine, Ser 9.14 0.57 - 1.00 mg/dL   eGFR 79 >78 GN/FAO/1.30   BUN/Creatinine Ratio 25 (H) 9 - 23   Sodium 141 134 - 144 mmol/L   Potassium 5.5 (H) 3.5 - 5.2 mmol/L   Chloride 100 96 - 106 mmol/L   CO2 24 20 - 29 mmol/L   Calcium 9.3 8.7 - 10.2 mg/dL   Total Protein 6.9 6.0 - 8.5 g/dL   Albumin 4.5 3.8 - 4.9 g/dL   Globulin, Total 2.4 1.5 - 4.5 g/dL   Albumin/Globulin Ratio 1.9 1.2 - 2.2   Bilirubin Total <0.2 0.0 - 1.2 mg/dL   Alkaline Phosphatase 105 44 - 121 IU/L   AST 16 0 - 40 IU/L   ALT 15 0 - 32 IU/L  TSH  Result Value Ref Range   TSH 0.412  (L) 0.450 - 4.500 uIU/mL  Hemoglobin A1c  Result Value Ref Range   Hgb A1c MFr Bld 6.0 (H) 4.8 - 5.6 %   Est. average glucose Bld gHb Est-mCnc 126 mg/dL  Vitamin Q65  Result Value Ref Range   Vitamin B-12 423 232 - 1,245 pg/mL    Recent Results (from the past 2160 hour(s))  Lipid panel     Status: None   Collection Time: 09/12/22  3:35 PM  Result Value Ref Range   Cholesterol, Total 168 100 - 199 mg/dL   Triglycerides 784 0 - 149 mg/dL   HDL 52 >69 mg/dL   VLDL Cholesterol Cal 22 5 - 40 mg/dL   LDL Chol Calc (NIH) 94 0 - 99 mg/dL   Chol/HDL Ratio 3.2 0.0 - 4.4 ratio    Comment:                                   T. Chol/HDL Ratio  Men  Women                               1/2 Avg.Risk  3.4    3.3                                   Avg.Risk  5.0    4.4                                2X Avg.Risk  9.6    7.1                                3X Avg.Risk 23.4   11.0   VITAMIN D 25 Hydroxy (Vit-D Deficiency, Fractures)     Status: None   Collection Time: 09/12/22  3:35 PM  Result Value Ref Range   Vit D, 25-Hydroxy 37.9 30.0 - 100.0 ng/mL    Comment: Vitamin D deficiency has been defined by the Institute of Medicine and an Endocrine Society practice guideline as a level of serum 25-OH vitamin D less than 20 ng/mL (1,2). The Endocrine Society went on to further define vitamin D insufficiency as a level between 21 and 29 ng/mL (2). 1. IOM (Institute of Medicine). 2010. Dietary reference    intakes for calcium and D. [place]: The    [employer]. 2. Holick MF, Binkley [place], Bischoff-Ferrari HA, et al.    Evaluation, treatment, and prevention of vitamin D    deficiency: an Endocrine Society clinical practice    guideline. JCEM. 2011 Jul; 96(7):1911-30.   CBC With Differential     Status: None   Collection Time: 09/12/22  3:35 PM  Result Value Ref Range   WBC 8.2 3.4 - 10.8 x10E3/uL   RBC 4.59 3.77 - 5.28 x10E6/uL    Hemoglobin 13.7 11.1 - 15.9 g/dL   Hematocrit 21.3 08.6 - 46.6 %   MCV 90 79 - 97 fL   MCH 29.8 26.6 - 33.0 pg   MCHC 33.1 31.5 - 35.7 g/dL   RDW 57.8 46.9 - 62.9 %   Neutrophils 60 Not Estab. %   Lymphs 30 Not Estab. %   Monocytes 7 Not Estab. %   Eos 2 Not Estab. %   Basos 1 Not Estab. %   Neutrophils Absolute 4.9 1.4 - 7.0 x10E3/uL   Lymphocytes Absolute 2.5 0.7 - 3.1 x10E3/uL   Monocytes Absolute 0.6 0.1 - 0.9 x10E3/uL   EOS (ABSOLUTE) 0.1 0.0 - 0.4 x10E3/uL   Basophils Absolute 0.1 0.0 - 0.2 x10E3/uL   Immature Granulocytes 0 Not Estab. %   Immature Grans (Abs) 0.0 0.0 - 0.1 x10E3/uL  CMP14+EGFR     Status: Abnormal   Collection Time: 09/12/22  3:35 PM  Result Value Ref Range   Glucose 85 70 - 99 mg/dL   BUN 22 6 - 24 mg/dL   Creatinine, Ser 5.28 0.57 - 1.00 mg/dL   eGFR 79 >41 LK/GMW/1.02   BUN/Creatinine Ratio 25 (H) 9 - 23   Sodium 141 134 - 144 mmol/L   Potassium 5.5 (H) 3.5 - 5.2 mmol/L   Chloride 100 96 - 106 mmol/L   CO2 24 20 - 29 mmol/L   Calcium 9.3 8.7 - 10.2 mg/dL  Total Protein 6.9 6.0 - 8.5 g/dL   Albumin 4.5 3.8 - 4.9 g/dL   Globulin, Total 2.4 1.5 - 4.5 g/dL   Albumin/Globulin Ratio 1.9 1.2 - 2.2   Bilirubin Total <0.2 0.0 - 1.2 mg/dL   Alkaline Phosphatase 105 44 - 121 IU/L   AST 16 0 - 40 IU/L   ALT 15 0 - 32 IU/L  TSH     Status: Abnormal   Collection Time: 09/12/22  3:35 PM  Result Value Ref Range   TSH 0.412 (L) 0.450 - 4.500 uIU/mL  Hemoglobin A1c     Status: Abnormal   Collection Time: 09/12/22  3:35 PM  Result Value Ref Range   Hgb A1c MFr Bld 6.0 (H) 4.8 - 5.6 %    Comment:          Prediabetes: 5.7 - 6.4          Diabetes: >6.4          Glycemic control for adults with diabetes: <7.0    Est. average glucose Bld gHb Est-mCnc 126 mg/dL  Vitamin Z61     Status: None   Collection Time: 09/12/22  3:35 PM  Result Value Ref Range   Vitamin B-12 423 232 - 1,245 pg/mL       Assessment & Plan:   Problem List Items Addressed This Visit        Other   Chronic pain syndrome (Chronic)   Relevant Medications   cyclobenzaprine (FLEXERIL) 10 MG tablet   meloxicam (MOBIC) 15 MG tablet   gabapentin (NEURONTIN) 800 MG tablet   HYDROcodone-acetaminophen (NORCO) 10-325 MG tablet   Other Relevant Orders   CBC With Differential (Completed)   CMP14+EGFR (Completed)   Fibromyalgia (Chronic)   Relevant Medications   cyclobenzaprine (FLEXERIL) 10 MG tablet   meloxicam (MOBIC) 15 MG tablet   gabapentin (NEURONTIN) 800 MG tablet   HYDROcodone-acetaminophen (NORCO) 10-325 MG tablet   Other Relevant Orders   CBC With Differential (Completed)   CMP14+EGFR (Completed)   Vitamin D deficiency   Relevant Orders   CBC With Differential (Completed)   CMP14+EGFR (Completed)   Hyperlipidemia   Relevant Medications   amLODipine-benazepril (LOTREL) 5-20 MG capsule   simvastatin (ZOCOR) 20 MG tablet   Other Relevant Orders   Lipid panel (Completed)   CBC With Differential (Completed)   CMP14+EGFR (Completed)   Neurogenic pain (Chronic)   Relevant Medications   gabapentin (NEURONTIN) 800 MG tablet   Other Relevant Orders   CBC With Differential (Completed)   CMP14+EGFR (Completed)   GAD (generalized anxiety disorder)   Relevant Orders   CBC With Differential (Completed)   CMP14+EGFR (Completed)   Panic disorder - Primary   Relevant Orders   CBC With Differential (Completed)   CMP14+EGFR (Completed)   Other Visit Diagnoses     Vitamin D deficiency, unspecified       Relevant Orders   VITAMIN D 25 Hydroxy (Vit-D Deficiency, Fractures) (Completed)   CBC With Differential (Completed)   CMP14+EGFR (Completed)   Prediabetes       Relevant Orders   CBC With Differential (Completed)   CMP14+EGFR (Completed)   Hemoglobin A1c (Completed)   B12 deficiency due to diet       Relevant Orders   CBC With Differential (Completed)   CMP14+EGFR (Completed)   Vitamin B12 (Completed)   Other fatigue       Relevant Orders   CBC With  Differential (Completed)   CMP14+EGFR (Completed)   TSH (Completed)  Return in about 3 months (around 12/12/2022) for F/U.   Total time spent: 30 minutes  Miki Kins, FNP  09/12/2022

## 2022-09-14 LAB — CMP14+EGFR
ALT: 15 IU/L (ref 0–32)
AST: 16 IU/L (ref 0–40)
Albumin/Globulin Ratio: 1.9 (ref 1.2–2.2)
Albumin: 4.5 g/dL (ref 3.8–4.9)
Alkaline Phosphatase: 105 IU/L (ref 44–121)
BUN/Creatinine Ratio: 25 — ABNORMAL HIGH (ref 9–23)
BUN: 22 mg/dL (ref 6–24)
Bilirubin Total: <0.2 mg/dL (ref 0.0–1.2)
CO2: 24 mmol/L (ref 20–29)
Calcium: 9.3 mg/dL (ref 8.7–10.2)
Chloride: 100 mmol/L (ref 96–106)
Creatinine, Ser: 0.88 mg/dL (ref 0.57–1.00)
Globulin, Total: 2.4 g/dL (ref 1.5–4.5)
Glucose: 85 mg/dL (ref 70–99)
Potassium: 5.5 mmol/L — ABNORMAL HIGH (ref 3.5–5.2)
Sodium: 141 mmol/L (ref 134–144)
Total Protein: 6.9 g/dL (ref 6.0–8.5)
eGFR: 79 mL/min/{1.73_m2} (ref >59)

## 2022-09-14 LAB — CBC WITH DIFFERENTIAL
Basophils Absolute: 0.1 10*3/uL (ref 0.0–0.2)
Basos: 1 %
EOS (ABSOLUTE): 0.1 10*3/uL (ref 0.0–0.4)
Eos: 2 %
Hematocrit: 41.4 % (ref 34.0–46.6)
Hemoglobin: 13.7 g/dL (ref 11.1–15.9)
Immature Grans (Abs): 0 10*3/uL (ref 0.0–0.1)
Immature Granulocytes: 0 %
Lymphocytes Absolute: 2.5 10*3/uL (ref 0.7–3.1)
Lymphs: 30 %
MCH: 29.8 pg (ref 26.6–33.0)
MCHC: 33.1 g/dL (ref 31.5–35.7)
MCV: 90 fL (ref 79–97)
Monocytes Absolute: 0.6 10*3/uL (ref 0.1–0.9)
Monocytes: 7 %
Neutrophils Absolute: 4.9 10*3/uL (ref 1.4–7.0)
Neutrophils: 60 %
RBC: 4.59 x10E6/uL (ref 3.77–5.28)
RDW: 12.3 % (ref 11.7–15.4)
WBC: 8.2 10*3/uL (ref 3.4–10.8)

## 2022-09-14 LAB — LIPID PANEL
Chol/HDL Ratio: 3.2 ratio (ref 0.0–4.4)
Cholesterol, Total: 168 mg/dL (ref 100–199)
HDL: 52 mg/dL (ref >39)
LDL Chol Calc (NIH): 94 mg/dL (ref 0–99)
Triglycerides: 122 mg/dL (ref 0–149)
VLDL Cholesterol Cal: 22 mg/dL (ref 5–40)

## 2022-09-14 LAB — HEMOGLOBIN A1C
Est. average glucose Bld gHb Est-mCnc: 126 mg/dL
Hgb A1c MFr Bld: 6 % — ABNORMAL HIGH (ref 4.8–5.6)

## 2022-09-14 LAB — VITAMIN D 25 HYDROXY (VIT D DEFICIENCY, FRACTURES): Vit D, 25-Hydroxy: 37.9 ng/mL (ref 30.0–100.0)

## 2022-09-14 LAB — VITAMIN B12: Vitamin B-12: 423 pg/mL (ref 232–1245)

## 2022-09-14 LAB — TSH: TSH: 0.412 u[IU]/mL — ABNORMAL LOW (ref 0.450–4.500)

## 2022-09-15 ENCOUNTER — Encounter: Payer: Self-pay | Admitting: Family

## 2022-09-15 DIAGNOSIS — M4696 Unspecified inflammatory spondylopathy, lumbar region: Secondary | ICD-10-CM | POA: Insufficient documentation

## 2022-09-15 NOTE — Assessment & Plan Note (Signed)
Patient is well managed.  She will find a pain provider and let me know where to send her.

## 2022-09-15 NOTE — Assessment & Plan Note (Signed)
Patient is well controlled with current medications.  She sees psychiatry so I will continue to defer to them for this.

## 2022-09-16 ENCOUNTER — Encounter: Payer: Self-pay | Admitting: Family

## 2022-09-16 ENCOUNTER — Telehealth: Payer: Self-pay

## 2022-09-16 MED ORDER — LORATADINE 10 MG PO TABS: 10.0000 mg | ORAL_TABLET | Freq: Every day | ORAL | 1 refills | Status: DC

## 2022-09-16 NOTE — Telephone Encounter (Signed)
Pt called and left vm regarding rx for allergies not being covered by insurance, looks like cetirizine or loratadine is covered please advise

## 2022-09-16 NOTE — Addendum Note (Signed)
Addended by: Grayling Congress on: 09/16/2022 04:57 PM   Modules accepted: Orders

## 2022-09-17 ENCOUNTER — Telehealth: Payer: Self-pay | Admitting: Family

## 2022-09-17 NOTE — Telephone Encounter (Signed)
Patient called stating that she needs a letter from [place] stating that she needs a new set of top dentures because she is disabled and needs them to eat.   Letter needs to be faxed to [facility] and her dentist.  Dentist fax [phone]

## 2022-09-18 LAB — SPECIMEN STATUS REPORT

## 2022-09-18 LAB — T3, FREE: T3, Free: 3.1 pg/mL (ref 2.0–4.4)

## 2022-09-18 LAB — T4: T4, Total: 6.5 ug/dL (ref 4.5–12.0)

## 2022-09-19 ENCOUNTER — Telehealth: Payer: Self-pay | Admitting: Family

## 2022-09-19 DIAGNOSIS — I459 Conduction disorder, unspecified: Secondary | ICD-10-CM

## 2022-09-19 NOTE — Telephone Encounter (Signed)
Patient called in stating that she still needs a letter to get her dentures. Patient will pick up letter when its ready.  The letter needs to contain these things: Weight loss (specific dates and weights) All medical conditions and medications for those conditions Upper denture is medically necessary Signed by an MD

## 2022-10-02 ENCOUNTER — Telehealth: Payer: Self-pay | Admitting: Psychiatry

## 2022-10-02 NOTE — Telephone Encounter (Signed)
I have reviewed medical records received from [facility] , [place], [place]  Date range 03/18/2022 - 09/17/2022.  Psychiatrist-[facility].  Patient was being treated for chronic pain, neuropathic pain, long-term current use of opioid analgesics drug, obesity.  Patient was prescribed medications like Abilify 10 mg p.o. daily, Wellbutrin 75 mg 3 times a day, Wellbutrin SR 150 mg daily, BuSpar 30 mg twice daily, gabapentin 800 mg 3 times a day, hydrocodone acetaminophen 10-325 mg every 8 hours as needed, trazodone 100 mg p.o. daily, venlafaxine 75 mg twice daily, venlafaxine 150 mg with the 75 mg to make it to 25 mg, venlafaxine 37.5 mg p.o. daily. ( Multiple duplicate medications as noted above )  I have also reviewed labs-dated 07/05/2022-CMP-within normal limits including AST/ALT  Lipid panel-LDL elevated at 106, cholesterol 185-normal, HDL-57-normal, triglycerides-121-normal.  Hemoglobin A1c-5.7-within normal limits.

## 2022-10-18 ENCOUNTER — Telehealth: Payer: Self-pay | Admitting: Family

## 2022-10-18 NOTE — Telephone Encounter (Signed)
Patient called in stating that she told Marchelle Folks about her right foot and ankle have been hurting and she wants to know if Marchelle Folks can order an x-ray for her. Please advise.

## 2022-10-23 ENCOUNTER — Encounter: Payer: Self-pay | Admitting: Family

## 2022-10-23 ENCOUNTER — Other Ambulatory Visit: Payer: Self-pay | Admitting: Family

## 2022-10-23 DIAGNOSIS — M25571 Pain in right ankle and joints of right foot: Secondary | ICD-10-CM

## 2022-10-23 DIAGNOSIS — M79671 Pain in right foot: Secondary | ICD-10-CM

## 2022-10-25 ENCOUNTER — Telehealth: Payer: Self-pay | Admitting: Psychiatry

## 2022-10-25 DIAGNOSIS — F410 Panic disorder [episodic paroxysmal anxiety] without agoraphobia: Secondary | ICD-10-CM

## 2022-10-25 DIAGNOSIS — F411 Generalized anxiety disorder: Secondary | ICD-10-CM

## 2022-10-25 DIAGNOSIS — F3178 Bipolar disorder, in full remission, most recent episode mixed: Secondary | ICD-10-CM

## 2022-10-25 MED ORDER — VENLAFAXINE HCL ER 150 MG PO CP24: 150.0000 mg | ORAL_CAPSULE | Freq: Every day | ORAL | 0 refills | Status: DC

## 2022-10-25 MED ORDER — BUSPIRONE HCL 30 MG PO TABS
30.0000 mg | ORAL_TABLET | Freq: Two times a day (BID) | ORAL | 0 refills | Status: DC
Start: 2022-10-25 — End: 2023-03-10

## 2022-10-25 MED ORDER — TRAZODONE HCL 100 MG PO TABS: ORAL_TABLET | ORAL | 0 refills | Status: DC

## 2022-10-25 MED ORDER — VENLAFAXINE HCL ER 75 MG PO CP24: 75.0000 mg | ORAL_CAPSULE | Freq: Every day | ORAL | 0 refills | Status: DC

## 2022-10-25 NOTE — Telephone Encounter (Signed)
Patient was rescheduled from June 19 to July 26. Patient requested refills on all medication before next appointment-Please advise

## 2022-10-25 NOTE — Telephone Encounter (Signed)
I have sent BuSpar, trazodone and venlafaxine to pharmacy.

## 2022-10-28 NOTE — Telephone Encounter (Signed)
Pt.notified

## 2022-11-01 ENCOUNTER — Ambulatory Visit: Payer: Medicaid Other | Admitting: Psychiatry

## 2022-11-04 ENCOUNTER — Ambulatory Visit
Admission: RE | Admit: 2022-11-04 | Discharge: 2022-11-04 | Disposition: A | Discharge status: Home / Self Care | Payer: Medicaid Other | Attending: Family | Admitting: Family

## 2022-11-04 ENCOUNTER — Ambulatory Visit
Admission: RE | Admit: 2022-11-04 | Discharge: 2022-11-04 | Disposition: A | Discharge status: Home / Self Care | Payer: Medicaid Other | Source: Ambulatory Visit | Attending: Family | Admitting: Family

## 2022-11-04 DIAGNOSIS — M25571 Pain in right ankle and joints of right foot: Secondary | ICD-10-CM | POA: Diagnosis present

## 2022-11-04 DIAGNOSIS — M79671 Pain in right foot: Secondary | ICD-10-CM | POA: Insufficient documentation

## 2022-11-06 ENCOUNTER — Telehealth: Payer: Self-pay | Admitting: Family

## 2022-11-06 ENCOUNTER — Ambulatory Visit: Payer: Medicaid Other | Admitting: Psychiatry

## 2022-11-06 NOTE — Telephone Encounter (Signed)
Patient left VM requesting results of her foot x-ray from Monday. Please advise.

## 2022-11-18 ENCOUNTER — Telehealth: Payer: Self-pay

## 2022-11-18 NOTE — Telephone Encounter (Signed)
Patient called asking for the increased dose of her ozempic to be called in

## 2022-11-19 ENCOUNTER — Telehealth: Payer: Self-pay | Admitting: Family

## 2022-11-19 NOTE — Telephone Encounter (Signed)
Patient left VM about needing MRI order for [facility]. Please advise.

## 2022-11-22 ENCOUNTER — Telehealth: Payer: Self-pay | Admitting: Family

## 2022-11-22 NOTE — Telephone Encounter (Signed)
Patient left VM that she needs an Rx for the increased dose of Ozempic, she wants a prescription for Lidocaine ointment, and she wants to discuss [facility] for her ankle.

## 2022-11-25 ENCOUNTER — Other Ambulatory Visit: Payer: Self-pay | Admitting: Family

## 2022-11-25 NOTE — Telephone Encounter (Signed)
Patient called again - please see message below.  She wants an order entered for MRI at [facility] for her ankle.

## 2022-11-26 ENCOUNTER — Other Ambulatory Visit: Payer: Self-pay

## 2022-11-28 ENCOUNTER — Other Ambulatory Visit: Payer: Self-pay

## 2022-12-12 ENCOUNTER — Encounter: Payer: Self-pay | Admitting: Family

## 2022-12-12 ENCOUNTER — Ambulatory Visit: Payer: Medicaid Other | Admitting: Family

## 2022-12-12 VITALS — BP 140/65 | HR 79 | Ht 67.0 in | Wt 258.2 lb

## 2022-12-12 DIAGNOSIS — M79643 Pain in unspecified hand: Secondary | ICD-10-CM

## 2022-12-12 DIAGNOSIS — M25571 Pain in right ankle and joints of right foot: Secondary | ICD-10-CM

## 2022-12-12 DIAGNOSIS — F3162 Bipolar disorder, current episode mixed, moderate: Secondary | ICD-10-CM | POA: Diagnosis not present

## 2022-12-12 DIAGNOSIS — E559 Vitamin D deficiency, unspecified: Secondary | ICD-10-CM | POA: Diagnosis not present

## 2022-12-12 DIAGNOSIS — I10 Essential (primary) hypertension: Secondary | ICD-10-CM

## 2022-12-12 DIAGNOSIS — M4696 Unspecified inflammatory spondylopathy, lumbar region: Secondary | ICD-10-CM

## 2022-12-12 MED ORDER — SEMAGLUTIDE (1 MG/DOSE) 4 MG/3ML ~~LOC~~ SOPN: 1.0000 mg | PEN_INJECTOR | SUBCUTANEOUS | 3 refills | Status: DC

## 2022-12-12 NOTE — Assessment & Plan Note (Signed)
Patient stable.  Well controlled with current therapy.   Continue current meds.  

## 2022-12-12 NOTE — Assessment & Plan Note (Signed)
Patient is seen by Ortho, who manage this condition.  She is well controlled with current therapy.   Will defer to them for further changes to plan of care.

## 2022-12-12 NOTE — Progress Notes (Signed)
Established Patient Office Visit  Subjective:  Patient ID: Shelly Wright, female    DOB: 1970/12/25  Age: 52 y.o. MRN: [id number]  Chief Complaint  Patient presents with   Follow-up    3 mo F/U    Patient is here today for her 3 months follow up.  She has been feeling fairly well since last appointment.   She does have additional concerns to discuss today.  She is continuing to have significant pain in her right ankle, which she was complaining of at her last appointment.  The x-ray done at that time showed nothing abnormal to explain her pain.  She says that she is still having trouble bearing weight on the right.   Labs are due today. She needs refills.   I have reviewed her active problem list, medication list, allergies, notes from last encounter, lab results for her appointment today.    No other concerns at this time.   Past Medical History:  Diagnosis Date   Acute right-sided low back pain with right-sided sciatica 08/30/2014   Anxiety    Arthritis    Asthma    in past, no current inhalers   Bipolar disorder (HCC)    Breathlessness on exertion 04/20/2014   Cervical spinal cord compression (HCC) 05/07/2012   COPD (chronic obstructive pulmonary disease) (HCC)    DDD (degenerative disc disease), lumbar    Depression    Dyspnea    with exertion   Dysrhythmia    irregular-h/o in the past   Fibromyalgia    H/O syncope 04/25/2014   High risk medication use 06/23/2019   Hypertension    Hyponatremia 05/07/2021   Long term current use of anticoagulant therapy (Lovenox) 05/06/2016   Neuritis or radiculitis due to rupture of lumbar intervertebral disc 05/23/2014   Noncompliance with treatment regimen 05/01/2020   Partner relational problem 10/05/2021   Periprosthetic fracture around prosthetic knee 07/31/2015   Personal history of perinatal problems 04/25/2014   Pharmacologic therapy 11/30/2019   Restless leg syndrome    Soft tissue lesion of shoulder region  01/24/2012   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   Syncope and collapse 04/08/2014   Thoracic and lumbosacral neuritis 05/07/2012   Thoracic neuritis 05/07/2012    Past Surgical History:  Procedure Laterality Date   ABDOMINAL HYSTERECTOMY     Cervical Fusion X 2     JOINT REPLACEMENT Right 01/2016   Dr Gavin Potters   KNEE ARTHROPLASTY Left 02/10/2019   Procedure: COMPUTER ASSISTED TOTAL KNEE ARTHROPLASTY - RNFA;  Surgeon: Donato Heinz, MD;  Location: [facility];  Service: Orthopedics;  Laterality: Left;   KNEE ARTHROSCOPY Bilateral    Right knee scope 1998, Left knee scope   REVERSE SHOULDER ARTHROPLASTY Right 03/12/2021   Procedure: Right reverse shoulder arthroplasty, biceps tenodesis;  Surgeon: Signa Kell, MD;  Location: [facility];  Service: Orthopedics;  Laterality: Right;   SHOULDER ARTHROSCOPY WITH SUBACROMIAL DECOMPRESSION AND OPEN ROTATOR C Left 06/18/2021   Procedure: Left shoulder arthroscopy, Open rotator cuff repair, subacromial decompression, distal clavicle excision and, open biceps tenodesis;  Surgeon: Signa Kell, MD;  Location: [facility];  Service: Orthopedics;  Laterality: Left;   TONSILLECTOMY     TOTAL KNEE ARTHROPLASTY Right 02/01/2015   Procedure: TOTAL KNEE ARTHROPLASTY;  Surgeon: Erin Sons, MD;  Location: [facility];  Service: Orthopedics;  Laterality: Right;   TUBAL LIGATION      Social History  Socioeconomic History   Marital status: Married    Spouse name: Molly Maduro   Number of children: 1   Years of education: Not on file   Highest education level: High school graduate  Occupational History   Not on file  Tobacco Use   Smoking status: Every Day    Current packs/day: 0.00    Average packs/day: 1 pack/day for 30.0 years (30.0 ttl pk-yrs)    Types: Cigarettes    Start date: 09/12/1991    Last attempt to quit: 09/11/2021    Years since quitting: 1.2   Smokeless tobacco:  Never  Vaping Use   Vaping status: Never Used  Substance and Sexual Activity   Alcohol use: No   Drug use: Yes    Types: Marijuana    Comment: every day   Sexual activity: Not Currently  Other Topics Concern   Not on file  Social History Narrative   Not on file   Social Determinants of Health   Financial Resource Strain: Low Risk  (09/16/2018)   Overall Financial Resource Strain (CARDIA)    Difficulty of Paying Living Expenses: Not hard at all  Food Insecurity: No Food Insecurity (09/16/2018)   Hunger Vital Sign    Worried About Running Out of Food in the Last Year: Never true    Ran Out of Food in the Last Year: Never true  Transportation Needs: No Transportation Needs (09/16/2018)   PRAPARE - Administrator, Civil Service (Medical): No    Lack of Transportation (Non-Medical): No  Physical Activity: Inactive (09/16/2018)   Exercise Vital Sign    Days of Exercise per Week: 0 days    Minutes of Exercise per Session: 0 min  Stress: Stress Concern Present (09/16/2018)   [employer] of Occupational Health - Occupational Stress Questionnaire    Feeling of Stress : To some extent  Social Connections: Unknown (09/16/2018)   Social Connection and Isolation Panel [NHANES]    Frequency of Communication with Friends and Family: Not on file    Frequency of Social Gatherings with Friends and Family: Not on file    Attends Religious Services: Never    Active Member of Clubs or Organizations: No    Attends Banker Meetings: Never    Marital Status: Separated  Intimate Partner Violence: Not At Risk (09/16/2018)   Humiliation, Afraid, Rape, and Kick questionnaire    Fear of Current or Ex-Partner: No    Emotionally Abused: No    Physically Abused: No    Sexually Abused: No    Family History  Problem Relation Age of Onset   Breast cancer Mother 61   Colon cancer Father    Heart attack Father    Bipolar disorder Sister    Depression Sister    Schizophrenia  Sister     No Known Allergies  Review of Systems  Musculoskeletal:  Positive for joint pain.  All other systems reviewed and are negative.      Objective:   BP (!) 140/65   Pulse 79   Ht 5\' 7"  (1.702 m)   Wt 258 lb 3.2 oz (117.1 kg)   SpO2 97%   BMI 40.44 kg/m   Vitals:   12/12/22 0926  BP: (!) 140/65  Pulse: 79  Height: 5\' 7"  (1.702 m)  Weight: 258 lb 3.2 oz (117.1 kg)  SpO2: 97%  BMI (Calculated): 40.43    Physical Exam Vitals and nursing note reviewed.  Constitutional:      Appearance:  Normal appearance. She is normal weight.  HENT:     Head: Normocephalic.  Eyes:     Pupils: Pupils are equal, round, and reactive to light.  Cardiovascular:     Rate and Rhythm: Normal rate.  Pulmonary:     Effort: Pulmonary effort is normal.  Musculoskeletal:     Right ankle: Swelling present. Tenderness present over the lateral malleolus. Decreased range of motion. Normal pulse.  Neurological:     General: No focal deficit present.     Mental Status: She is alert and oriented to person, place, and time. Mental status is at baseline.  Psychiatric:        Mood and Affect: Mood normal.        Behavior: Behavior normal.        Thought Content: Thought content normal.        Judgment: Judgment normal.      No results found for any visits on 12/12/22.  No results found for this or any previous visit (from the past 2160 hour(s)).     Assessment & Plan:   Problem List Items Addressed This Visit       Active Problems   BP (high blood pressure)    Blood pressure well controlled with current medications.  Continue current therapy.  Will reassess at follow up.       Vitamin D deficiency - Primary    Patient stable.  Well controlled with current therapy.   Continue current meds.        Bipolar disorder, current episode mixed, moderate (HCC)    Patient is seen by psychiatry, who manage this condition.  She is well controlled with current therapy.   Will defer  to them for further changes to plan of care.       Unspecified inflammatory spondylopathy, lumbar region [facility])    Patient is seen by Ortho, who manage this condition.  She is well controlled with current therapy.   Will defer to them for further changes to plan of care.       Other Visit Diagnoses     Acute right ankle pain       Given that the ankle pain continues, and she has not had any improvement with the meds we sent or with her exercises, I think we need an MRI - ordering today   Relevant Orders   MR ANKLE RIGHT WO CONTRAST       Return in about 3 months (around 03/14/2023).   Total time spent: 30 minutes  Miki Kins, FNP  12/12/2022   This document [place] have been prepared by [facility] Voice Recognition software and as such Safi include unintentional dictation errors.

## 2022-12-12 NOTE — Assessment & Plan Note (Signed)
Blood pressure well controlled with current medications.  Continue current therapy.  Will reassess at follow up.  

## 2022-12-13 ENCOUNTER — Telehealth: Payer: Medicaid Other | Admitting: Psychiatry

## 2022-12-13 ENCOUNTER — Encounter: Payer: Self-pay | Admitting: Psychiatry

## 2022-12-13 DIAGNOSIS — Z79899 Other long term (current) drug therapy: Secondary | ICD-10-CM

## 2022-12-13 DIAGNOSIS — F17200 Nicotine dependence, unspecified, uncomplicated: Secondary | ICD-10-CM

## 2022-12-13 DIAGNOSIS — F411 Generalized anxiety disorder: Secondary | ICD-10-CM | POA: Diagnosis not present

## 2022-12-13 DIAGNOSIS — F3178 Bipolar disorder, in full remission, most recent episode mixed: Secondary | ICD-10-CM

## 2022-12-13 DIAGNOSIS — F1220 Cannabis dependence, uncomplicated: Secondary | ICD-10-CM

## 2022-12-13 DIAGNOSIS — F17210 Nicotine dependence, cigarettes, uncomplicated: Secondary | ICD-10-CM

## 2022-12-13 DIAGNOSIS — F410 Panic disorder [episodic paroxysmal anxiety] without agoraphobia: Secondary | ICD-10-CM | POA: Diagnosis not present

## 2022-12-13 NOTE — Progress Notes (Signed)
Virtual Visit via Video Note  I connected with Shelly Wright on 12/13/22 at  9:30 AM EDT by a video enabled telemedicine application and verified that I am speaking with the correct person using two identifiers.  Location Provider Location : [facility] Patient Location : Home  Participants: Patient , Provider   I discussed the limitations of evaluation and management by telemedicine and the availability of in person appointments. The patient expressed understanding and agreed to proceed.   I discussed the assessment and treatment plan with the patient. The patient was provided an opportunity to ask questions and all were answered. The patient agreed with the plan and demonstrated an understanding of the instructions.   The patient was advised to call back or seek an in-person evaluation if the symptoms worsen or if the condition fails to improve as anticipated.   BH MD OP Progress Note  12/13/2022 12:28 PM Kale Millstein Ramsaran  MRN:  [id number]  Chief Complaint:  Chief Complaint  Patient presents with   Follow-up   Depression   Medication Refill   HPI: Shelly Wright is a 52 year old Caucasian female on SSI, has a history of bipolar disorder, GAD, panic disorder, cannabis use disorder, tobacco use disorder, married, lives in [place] was evaluated by telemedicine today.  Patient today reports she is currently anxious about the fact that she inherited some property but is having trouble acquiring it due to inability to pay back taxes and pay for an attorney.  That does worry her.  Patient however reports she however has been managing okay, has been reaching out to her support system.  Patient otherwise denies any significant mood swings, mania or hypomanic symptoms.  Denies any depression symptoms.  Denies any psychosis.  Patient reports sleep is overall good.  Patient denies any suicidality, homicidality or perceptual disturbances.  Reports she is currently cutting back on using cannabis.   Continues to smoke cigarettes.  Not ready to cut back on the cigarettes at this time.  Patient denies any side effects to her medications including the venlafaxine, BuSpar, trazodone.  She is compliant on them.  Reviewed and discussed labs, as noted below.  Patient denies any other concerns today.  Visit Diagnosis:    ICD-10-CM   1. Bipolar disorder, in full remission, most recent episode mixed (HCC)  F31.78     2. GAD (generalized anxiety disorder)  F41.1     3. Panic disorder  F41.0     4. Cannabis use disorder, moderate, dependence (HCC)  F12.20     5. Tobacco use disorder  F17.200     6. High risk medication use  Z79.899       Past Psychiatric History: I have reviewed past psychiatric history from progress note on 09/16/2018.  Past trials of venlafaxine, trazodone, Seroquel, hydroxyzine, Zoloft, Lexapro, duloxetine, Paxil, Abilify, Wellbutrin.  Past Medical History:  Past Medical History:  Diagnosis Date   Acute right-sided low back pain with right-sided sciatica 08/30/2014   Anxiety    Arthritis    Asthma    in past, no current inhalers   Bipolar disorder (HCC)    Breathlessness on exertion 04/20/2014   Cervical spinal cord compression (HCC) 05/07/2012   COPD (chronic obstructive pulmonary disease) (HCC)    DDD (degenerative disc disease), lumbar    Depression    Dyspnea    with exertion   Dysrhythmia    irregular-h/o in the past   Fibromyalgia    H/O syncope 04/25/2014   High risk  medication use 06/23/2019   Hypertension    Hyponatremia 05/07/2021   Long term current use of anticoagulant therapy (Lovenox) 05/06/2016   Neuritis or radiculitis due to rupture of lumbar intervertebral disc 05/23/2014   Noncompliance with treatment regimen 05/01/2020   Partner relational problem 10/05/2021   Periprosthetic fracture around prosthetic knee 07/31/2015   Personal history of perinatal problems 04/25/2014   Pharmacologic therapy 11/30/2019   Restless leg syndrome     Soft tissue lesion of shoulder region 01/24/2012   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   Syncope and collapse 04/08/2014   Thoracic and lumbosacral neuritis 05/07/2012   Thoracic neuritis 05/07/2012    Past Surgical History:  Procedure Laterality Date   ABDOMINAL HYSTERECTOMY     Cervical Fusion X 2     JOINT REPLACEMENT Right 01/2016   Dr Gavin Potters   KNEE ARTHROPLASTY Left 02/10/2019   Procedure: COMPUTER ASSISTED TOTAL KNEE ARTHROPLASTY - RNFA;  Surgeon: Donato Heinz, MD;  Location: [facility];  Service: Orthopedics;  Laterality: Left;   KNEE ARTHROSCOPY Bilateral    Right knee scope 1998, Left knee scope   REVERSE SHOULDER ARTHROPLASTY Right 03/12/2021   Procedure: Right reverse shoulder arthroplasty, biceps tenodesis;  Surgeon: Signa Kell, MD;  Location: [facility];  Service: Orthopedics;  Laterality: Right;   SHOULDER ARTHROSCOPY WITH SUBACROMIAL DECOMPRESSION AND OPEN ROTATOR C Left 06/18/2021   Procedure: Left shoulder arthroscopy, Open rotator cuff repair, subacromial decompression, distal clavicle excision and, open biceps tenodesis;  Surgeon: Signa Kell, MD;  Location: [facility];  Service: Orthopedics;  Laterality: Left;   TONSILLECTOMY     TOTAL KNEE ARTHROPLASTY Right 02/01/2015   Procedure: TOTAL KNEE ARTHROPLASTY;  Surgeon: Erin Sons, MD;  Location: [facility];  Service: Orthopedics;  Laterality: Right;   TUBAL LIGATION      Family Psychiatric History: Reviewed family psychiatric history from progress note on 09/16/2018.  Family History:  Family History  Problem Relation Age of Onset   Breast cancer Mother 54   Colon cancer Father    Heart attack Father    Bipolar disorder Sister    Depression Sister    Schizophrenia Sister     Social History: Reviewed social history from progress note on 09/16/2018. Social History   Socioeconomic History   Marital status: Married    Spouse name:  Molly Maduro   Number of children: 1   Years of education: Not on file   Highest education level: High school graduate  Occupational History   Not on file  Tobacco Use   Smoking status: Every Day    Current packs/day: 0.00    Average packs/day: 1 pack/day for 30.0 years (30.0 ttl pk-yrs)    Types: Cigarettes    Start date: 09/12/1991    Last attempt to quit: 09/11/2021    Years since quitting: 1.2   Smokeless tobacco: Never  Vaping Use   Vaping status: Never Used  Substance and Sexual Activity   Alcohol use: No   Drug use: Yes    Types: Marijuana    Comment: every day   Sexual activity: Not Currently  Other Topics Concern   Not on file  Social History Narrative   Not on file   Social Determinants of Health   Financial Resource Strain: Low Risk  (09/16/2018)   Overall Financial Resource Strain (CARDIA)    Difficulty of Paying Living Expenses: Not hard at all  Food Insecurity: No Food  Insecurity (09/16/2018)   Hunger Vital Sign    Worried About Running Out of Food in the Last Year: Never true    Ran Out of Food in the Last Year: Never true  Transportation Needs: No Transportation Needs (09/16/2018)   PRAPARE - Administrator, Civil Service (Medical): No    Lack of Transportation (Non-Medical): No  Physical Activity: Inactive (09/16/2018)   Exercise Vital Sign    Days of Exercise per Week: 0 days    Minutes of Exercise per Session: 0 min  Stress: Stress Concern Present (09/16/2018)   [employer] of Occupational Health - Occupational Stress Questionnaire    Feeling of Stress : To some extent  Social Connections: Unknown (09/16/2018)   Social Connection and Isolation Panel [NHANES]    Frequency of Communication with Friends and Family: Not on file    Frequency of Social Gatherings with Friends and Family: Not on file    Attends Religious Services: Never    Active Member of Clubs or Organizations: No    Attends Engineer, structural: Never    Marital  Status: Separated    Allergies: No Known Allergies  Metabolic Disorder Labs: Lab Results  Component Value Date   HGBA1C 6.0 (H) 09/12/2022   MPG 131.24 09/26/2020   Lab Results  Component Value Date   PROLACTIN 6.4 09/26/2020   Lab Results  Component Value Date   CHOL 168 09/12/2022   TRIG 122 09/12/2022   HDL 52 09/12/2022   CHOLHDL 3.2 09/12/2022   VLDL 20 09/26/2020   LDLCALC 94 09/12/2022   LDLCALC 100 (H) 09/26/2020   Lab Results  Component Value Date   TSH 0.412 (L) 09/12/2022   TSH 0.696 09/26/2020    Therapeutic Level Labs: No results found for: "LITHIUM" No results found for: "VALPROATE" No results found for: "CBMZ"  Current Medications: Current Outpatient Medications  Medication Sig Dispense Refill   acetaminophen (TYLENOL) 500 MG tablet Take 500 mg by mouth every 6 (six) hours as needed.     ADVAIR DISKUS 250-50 MCG/DOSE AEPB Inhale 1 puff into the lungs daily.     albuterol (VENTOLIN HFA) 108 (90 Base) MCG/ACT inhaler Inhale 2 puffs into the lungs every 6 (six) hours as needed for wheezing or shortness of breath. 18 g 5   amLODipine-benazepril (LOTREL) 5-20 MG capsule Take 1 capsule by mouth daily. 90 capsule 1   busPIRone (BUSPAR) 30 MG tablet Take 1 tablet (30 mg total) by mouth 2 (two) times daily. 180 tablet 0   cyclobenzaprine (FLEXERIL) 10 MG tablet TAKE ONE TABLET BY MOUTH 3 TIMES DAILY AS NEEDED FOR MUSCLE SPASM 90 tablet 1   gabapentin (NEURONTIN) 800 MG tablet Take 1 tablet (800 mg total) by mouth 3 (three) times daily. 90 tablet 5   lidocaine (XYLOCAINE) 5 % ointment Apply 1 Application topically as needed for moderate pain or mild pain. 35.44 g 5   meloxicam (MOBIC) 15 MG tablet Take 1 tablet (15 mg total) by mouth daily as needed. 90 tablet 1   mupirocin ointment (BACTROBAN) 2 % Apply small (pea size) amount to inside of BILATERAL nares TWICE a day for the next 5 days. 15 g 0   Semaglutide, 1 MG/DOSE, 4 MG/3ML SOPN Inject 1 mg into the skin  once a week. 3 mL 3   simvastatin (ZOCOR) 20 MG tablet Take 1 tablet (20 mg total) by mouth at bedtime. 90 tablet 1   traZODone (DESYREL) 100 MG tablet TAKE 2  TABLETS BY MOUTH AT BEDTIME AS NEEDED FOR SLEEP 180 tablet 0   venlafaxine XR (EFFEXOR-XR) 150 MG 24 hr capsule Take 1 capsule (150 mg total) by mouth daily with breakfast. TAKE 1 CAPSULE BY MOUTH DAILY ALONG WITH 75 MG CAPSULE 90 capsule 0   venlafaxine XR (EFFEXOR-XR) 75 MG 24 hr capsule Take 1 capsule (75 mg total) by mouth daily. Take along with 150 mg daily 90 capsule 0   No current facility-administered medications for this visit.     Musculoskeletal: Strength & Muscle Tone:  UTA Gait & Station:  Seated Patient leans: N/A  Psychiatric Specialty Exam: Review of Systems  Psychiatric/Behavioral:  The patient is nervous/anxious.     There were no vitals taken for this visit.There is no height or weight on file to calculate BMI.  General Appearance: Fairly Groomed  Eye Contact:  Fair  Speech:  Clear and Coherent  Volume:  Normal  Mood:  Anxious  Affect:  Congruent  Thought Process:  Goal Directed and Descriptions of Associations: Intact  Orientation:  Full (Time, Place, and Person)  Thought Content: Logical   Suicidal Thoughts:  No  Homicidal Thoughts:  No  Memory:  Immediate;   Fair Recent;   Fair Remote;   Fair  Judgement:  Fair  Insight:  Fair  Psychomotor Activity:  Normal  Concentration:  Concentration: Fair and Attention Span: Fair  Recall:  [employer] of Knowledge: Fair  Language: Fair  Akathisia:  No  Handed:  Right  AIMS (if indicated): not done  Assets:  Communication Skills Desire for Improvement Housing Social Support  ADL's:  Intact  Cognition: WNL  Sleep:  Fair   Screenings: Midwife Visit from 01/07/2022 in [facility] Office Visit from 10/05/2021 in [facility] Office Visit from 11/27/2020 in  [facility] Office Visit from 09/26/2020 in [facility]  AIMS Total Score 0 0 0 0      GAD-7    Flowsheet Row Office Visit from 09/10/2022 in [facility] Office Visit from 01/07/2022 in [facility] Office Visit from 08/14/2021 in [facility] Office Visit from 11/27/2020 in [facility] Office Visit from 09/26/2020 in [facility]  Total GAD-7 Score 2 4 0 4 5      PHQ2-9    Flowsheet Row Office Visit from 09/10/2022 in [facility] Office Visit from 01/07/2022 in [facility] Office Visit from 10/05/2021 in [facility] Office Visit from 05/07/2021 in [facility] Office Visit from 02/13/2021 in [facility]  PHQ-2 Total Score 0 1 2 0 2  PHQ-9 Total Score 2 -- 4 -- 3      Flowsheet Row Video Visit from 12/13/2022 in [facility] Office Visit from 09/10/2022 in [facility] Office Visit from 01/07/2022 in [facility]  C-SSRS RISK CATEGORY No Risk No Risk No Risk        Assessment and Plan: Garna Rotundo Brewbaker is a 52 year old Caucasian female on SSI, history of bipolar disorder, tobacco use disorder, was evaluated by telemedicine today.  Patient is currently stable although does have situational stresses, managing  it well.  Plan as noted below.  Plan Bipolar disorder most recent episode mixed in remission Continue gabapentin 800 mg 4 times daily prescribed for pain however it is also a mood stabilizer. Could  consider adding Abilify in the future as needed.  GAD/panic disorder-stable Venlafaxine extended release 225 mg p.o. daily BuSpar 30 mg p.o. twice daily  Cannabis use disorder moderate-improving Provided counseling patient is currently cutting back.  Tobacco use disorder-unstable Patient is not ready to quit.  High risk medication use-I have reviewed labs including TSH-09/12/2022-low although T3-T4 was within normal limits.  Patient to reach out to primary care provider to repeat this. I have reviewed hemoglobin A1c-down trending, lipid panel-within normal limits.  Follow-up in clinic in 3 months or sooner if needed.   Collaboration of Care: Collaboration of Care: Primary Care Provider AEB patient encouraged to reach out to primary care provider to repeat thyroid panel.  Patient/Guardian was advised Release of Information must be obtained prior to any record release in order to collaborate their care with an outside provider. Patient/Guardian was advised if they have not already done so to contact the registration department to sign all necessary forms in order for [place] to release information regarding their care.   Consent: Patient/Guardian gives verbal consent for treatment and assignment of benefits for services provided during this visit. Patient/Guardian expressed understanding and agreed to proceed.   This note was generated in part or whole with voice recognition software. Voice recognition is usually quite accurate but there are transcription errors that can and very often do occur. I apologize for any typographical errors that were not detected and corrected.    Jomarie Longs, MD 12/13/2022, 12:28 PM

## 2022-12-17 ENCOUNTER — Other Ambulatory Visit: Payer: Self-pay | Admitting: Family

## 2022-12-22 ENCOUNTER — Encounter: Payer: Self-pay | Admitting: Family

## 2022-12-22 NOTE — Assessment & Plan Note (Signed)
Patient is seen by psychiatry, who manage this condition.  She is well controlled with current therapy.   Will defer to them for further changes to plan of care.

## 2022-12-24 ENCOUNTER — Telehealth: Payer: Self-pay | Admitting: Family

## 2022-12-24 NOTE — Telephone Encounter (Signed)
Patient left VM wanting to know if there is anything Marchelle Folks can do for her about her hormones being up. States that her "head doctor" told her that her hormones are up. She would like a call back. Please advise.

## 2022-12-25 NOTE — Telephone Encounter (Signed)
Spoke with pt who verbalized she is fine waiting on having lab work done at her next appointment.

## 2023-01-01 ENCOUNTER — Telehealth: Payer: Self-pay

## 2023-01-01 ENCOUNTER — Other Ambulatory Visit: Payer: Self-pay

## 2023-01-02 MED ORDER — LIDOCAINE 5 % EX OINT: 1.0000 | TOPICAL_OINTMENT | CUTANEOUS | 5 refills | Status: AC | PRN

## 2023-01-02 NOTE — Telephone Encounter (Signed)
Spoke to pt who informed me that she has reached out to [facility] and was informed that they do not have an order for an MRI. After reviewing I was able to find where order was sent to [facility] outpatient clinic. Pt was educated on how to contact office and if any other problems or concerns to reach out.

## 2023-01-04 ENCOUNTER — Ambulatory Visit
Admission: RE | Admit: 2023-01-04 | Discharge: 2023-01-04 | Disposition: A | Discharge status: Home / Self Care | Payer: Medicaid Other | Source: Ambulatory Visit | Attending: Family | Admitting: Family

## 2023-01-04 DIAGNOSIS — M25571 Pain in right ankle and joints of right foot: Secondary | ICD-10-CM

## 2023-01-07 ENCOUNTER — Telehealth: Payer: Self-pay | Admitting: Family

## 2023-01-07 NOTE — Telephone Encounter (Signed)
Patient left VM that she needs the letter for her dentures emailed to her and to [facility]. Dentist has not received the letter and she cannot get her dentures until they do.

## 2023-01-08 ENCOUNTER — Telehealth: Payer: Self-pay | Admitting: Family

## 2023-01-08 NOTE — Telephone Encounter (Signed)
Patient left VM requesting a referral to [facility] for her hand pain. States she is having hand pain into her palm. Please advse.

## 2023-01-14 NOTE — Addendum Note (Signed)
Addended by: Grayling Congress on: 01/14/2023 09:06 AM   Modules accepted: Orders

## 2023-01-21 NOTE — Telephone Encounter (Signed)
Patient already referred to Ortho

## 2023-01-27 ENCOUNTER — Other Ambulatory Visit: Payer: Self-pay | Admitting: Psychiatry

## 2023-01-27 DIAGNOSIS — F411 Generalized anxiety disorder: Secondary | ICD-10-CM

## 2023-01-27 DIAGNOSIS — F410 Panic disorder [episodic paroxysmal anxiety] without agoraphobia: Secondary | ICD-10-CM

## 2023-01-28 ENCOUNTER — Telehealth: Payer: Self-pay | Admitting: Family

## 2023-01-28 NOTE — Telephone Encounter (Signed)
Patient called in stating that she received a denial letter yesterday from her insurance company stating that they denied her dentures stating that they did not receive the doctors note. She is going to have to do an appeal.   Insurance fax # to send note to is [phone].  We need to print the doctors note and fax to the above number.

## 2023-02-03 ENCOUNTER — Other Ambulatory Visit: Payer: Self-pay | Admitting: Orthopedic Surgery

## 2023-02-03 DIAGNOSIS — M75122 Complete rotator cuff tear or rupture of left shoulder, not specified as traumatic: Secondary | ICD-10-CM

## 2023-02-05 ENCOUNTER — Other Ambulatory Visit: Payer: Self-pay | Admitting: Orthopedic Surgery

## 2023-02-05 ENCOUNTER — Encounter: Payer: Self-pay | Admitting: Orthopedic Surgery

## 2023-02-06 ENCOUNTER — Telehealth: Payer: Self-pay

## 2023-02-07 ENCOUNTER — Ambulatory Visit
Admission: RE | Admit: 2023-02-07 | Discharge: 2023-02-07 | Disposition: A | Discharge status: Home / Self Care | Payer: Medicaid Other | Source: Ambulatory Visit | Attending: Orthopedic Surgery

## 2023-02-07 DIAGNOSIS — M75122 Complete rotator cuff tear or rupture of left shoulder, not specified as traumatic: Secondary | ICD-10-CM

## 2023-02-10 NOTE — Telephone Encounter (Signed)
Patient informed to make appt

## 2023-02-17 ENCOUNTER — Other Ambulatory Visit: Payer: Self-pay | Admitting: Family

## 2023-02-19 ENCOUNTER — Telehealth: Payer: Self-pay | Admitting: Family

## 2023-02-19 ENCOUNTER — Encounter
Admission: RE | Admit: 2023-02-19 | Discharge: 2023-02-19 | Disposition: A | Discharge status: Home / Self Care | Payer: Medicaid Other | Source: Ambulatory Visit | Attending: Orthopedic Surgery | Admitting: Orthopedic Surgery

## 2023-02-19 VITALS — BP 122/77 | HR 72 | Resp 14 | Ht 67.0 in | Wt 253.1 lb

## 2023-02-19 DIAGNOSIS — E119 Type 2 diabetes mellitus without complications: Secondary | ICD-10-CM | POA: Insufficient documentation

## 2023-02-19 DIAGNOSIS — I10 Essential (primary) hypertension: Secondary | ICD-10-CM | POA: Insufficient documentation

## 2023-02-19 DIAGNOSIS — Z01818 Encounter for other preprocedural examination: Secondary | ICD-10-CM | POA: Diagnosis present

## 2023-02-19 DIAGNOSIS — Z01812 Encounter for preprocedural laboratory examination: Secondary | ICD-10-CM

## 2023-02-19 DIAGNOSIS — Z01810 Encounter for preprocedural cardiovascular examination: Secondary | ICD-10-CM | POA: Diagnosis not present

## 2023-02-19 HISTORY — DX: Other forms of dyspnea: R06.09

## 2023-02-19 HISTORY — DX: Prediabetes: R73.03

## 2023-02-19 HISTORY — DX: Vitamin D deficiency, unspecified: E55.9

## 2023-02-19 HISTORY — DX: Abnormal electrocardiogram (ECG) (EKG): R94.31

## 2023-02-19 HISTORY — DX: Obesity, unspecified: E66.9

## 2023-02-19 HISTORY — DX: Cannabis use, unspecified, uncomplicated: F12.90

## 2023-02-19 HISTORY — DX: Chronic pain syndrome: G89.4

## 2023-02-19 HISTORY — DX: Nicotine dependence, unspecified, uncomplicated: F17.200

## 2023-02-19 HISTORY — DX: Spinal stenosis, lumbar region without neurogenic claudication: M48.061

## 2023-02-19 HISTORY — DX: Hyperlipidemia, unspecified: E78.5

## 2023-02-19 HISTORY — DX: Hypokalemia: E87.6

## 2023-02-19 LAB — CBC WITH DIFFERENTIAL/PLATELET
Abs Immature Granulocytes: 0.02 10*3/uL (ref 0.00–0.07)
Basophils Absolute: 0 10*3/uL (ref 0.0–0.1)
Basophils Relative: 1 %
Eosinophils Absolute: 0.2 10*3/uL (ref 0.0–0.5)
Eosinophils Relative: 2 %
HCT: 42 % (ref 36.0–46.0)
Hemoglobin: 14.7 g/dL (ref 12.0–15.0)
Immature Granulocytes: 0 %
Lymphocytes Relative: 31 %
Lymphs Abs: 2.3 10*3/uL (ref 0.7–4.0)
MCH: 30.9 pg (ref 26.0–34.0)
MCHC: 35 g/dL (ref 30.0–36.0)
MCV: 88.2 fL (ref 80.0–100.0)
Monocytes Absolute: 0.4 10*3/uL (ref 0.1–1.0)
Monocytes Relative: 6 %
Neutro Abs: 4.3 10*3/uL (ref 1.7–7.7)
Neutrophils Relative %: 60 %
Platelets: 242 10*3/uL (ref 150–400)
RBC: 4.76 MIL/uL (ref 3.87–5.11)
RDW: 12.6 % (ref 11.5–15.5)
WBC: 7.3 10*3/uL (ref 4.0–10.5)
nRBC: 0 % (ref 0.0–0.2)

## 2023-02-19 LAB — URINALYSIS, ROUTINE W REFLEX MICROSCOPIC
Bilirubin Urine: NEGATIVE
Glucose, UA: NEGATIVE mg/dL
Hgb urine dipstick: NEGATIVE
Ketones, ur: NEGATIVE mg/dL
Leukocytes,Ua: NEGATIVE
Nitrite: NEGATIVE
Protein, ur: NEGATIVE mg/dL
Specific Gravity, Urine: 1.004 — ABNORMAL LOW (ref 1.005–1.030)
pH: 6 (ref 5.0–8.0)

## 2023-02-19 LAB — COMPREHENSIVE METABOLIC PANEL
ALT: 20 U/L (ref 0–44)
AST: 22 U/L (ref 15–41)
Albumin: 4.3 g/dL (ref 3.5–5.0)
Alkaline Phosphatase: 79 U/L (ref 38–126)
Anion gap: 5 (ref 5–15)
BUN: 16 mg/dL (ref 6–20)
CO2: 27 mmol/L (ref 22–32)
Calcium: 9.2 mg/dL (ref 8.9–10.3)
Chloride: 104 mmol/L (ref 98–111)
Creatinine, Ser: 1.01 mg/dL — ABNORMAL HIGH (ref 0.44–1.00)
GFR, Estimated: >60 mL/min (ref >60)
Glucose, Bld: 107 mg/dL — ABNORMAL HIGH (ref 70–99)
Potassium: 4.1 mmol/L (ref 3.5–5.1)
Sodium: 136 mmol/L (ref 135–145)
Total Bilirubin: 0.6 mg/dL (ref 0.3–1.2)
Total Protein: 7.2 g/dL (ref 6.5–8.1)

## 2023-02-19 LAB — HEMOGLOBIN A1C
Hgb A1c MFr Bld: 6.1 % — ABNORMAL HIGH (ref 4.8–5.6)
Mean Plasma Glucose: 128.37 mg/dL

## 2023-02-19 LAB — SURGICAL PCR SCREEN
MRSA, PCR: NEGATIVE
Staphylococcus aureus: NEGATIVE

## 2023-02-19 LAB — TYPE AND SCREEN
ABO/RH(D): AB POS
Antibody Screen: NEGATIVE

## 2023-02-19 NOTE — Telephone Encounter (Signed)
Patient called in requesting pain meds for her shoulder. States she is having surgery on 10/15. She does NOT want tramadol. States the surgeon will not send her pain meds before surgery except tramadol and they did not send her that in. She has an appt with you tomorrow at 9:45.

## 2023-02-19 NOTE — Patient Instructions (Addendum)
Your procedure is scheduled on:03-04-23 Tuesday Report to the Registration Desk on the 1st floor of the [facility].Then proceed to the 2nd floor Surgery Desk To find out your arrival time, please call [phone] between 1PM - 3PM on:03-03-23 Monday If your arrival time is 6:00 am, do not arrive before that time as the [facility] entrance doors do not open until 6:00 am.  REMEMBER: Instructions that are not followed completely Besser result in serious medical risk, up to and including death; or upon the discretion of your surgeon and anesthesiologist your surgery Nabers need to be rescheduled.  Do not eat food after midnight the night before surgery.  No gum chewing or hard candies.  You Martelle however, drink Water up to 2 hours before you are scheduled to arrive for your surgery. Do not drink anything within 2 hours of your scheduled arrival time.  In addition, your doctor has ordered for you to drink the provided:  Gatorade G2 Drinking this carbohydrate drink up to two hours before surgery helps to reduce insulin resistance and improve patient outcomes. Please complete drinking 2 hours before scheduled arrival time.  One week prior to surgery:Last dose on 02-24-23 Stop Anti-inflammatories (NSAIDS) such as Advil, Aleve, Ibuprofen, Motrin, Naproxen, Naprosyn and Aspirin based products such as Excedrin, Goody's Powder, BC Powder. Stop ANY OVER THE COUNTER supplements/vitamins 7 days prior to surgery You Castronovo however, continue to take Tylenol if needed for pain up until the day of surgery.  Continue taking all prescribed medications with the exception of the following: -meloxicam (MOBIC)-Stop 7 days prior to surgery-Last dose will be on 02-24-23 Monday -Semaglutide (Ozempic)-Stop 7 days prior to surgery-Last dose will be on 02-20-23 Thursday -Do NOT take again until AFTER surgery  TAKE ONLY THESE MEDICATIONS THE MORNING OF SURGERY WITH A SIP OF WATER: -busPIRone (BUSPAR)  -cyclobenzaprine  (FLEXERIL)  -gabapentin (NEURONTIN)  -venlafaxine XR (EFFEXOR-XR)   Use your Albuterol Inhaler the day of surgery and bring your Albuterol Inhaler to the hospital  No Alcohol for 24 hours before or after surgery.  No Smoking including e-cigarettes for 24 hours before surgery.  No chewable tobacco products for at least 6 hours before surgery.  No nicotine patches on the day of surgery.  Do not use any "recreational" drugs for at least a week (preferably 2 weeks) before your surgery.  Please be advised that the combination of cocaine and anesthesia Elahi have negative outcomes, up to and including death. If you test positive for cocaine, your surgery will be cancelled.  On the morning of surgery brush your teeth with toothpaste and water, you Gaffey rinse your mouth with mouthwash if you wish. Do not swallow any toothpaste or mouthwash.  Use CHG Soap as directed on instruction sheet.  Do not wear jewelry, make-up, hairpins, clips or nail polish.  For welded (permanent) jewelry: bracelets, anklets, waist bands, etc.  Please have this removed prior to surgery.  If it is not removed, there is a chance that hospital personnel will need to cut it off on the day of surgery.  Do not wear lotions, powders, or perfumes.   Do not shave body hair from the neck down 48 hours before surgery.  Contact lenses, hearing aids and dentures Daffin not be worn into surgery.  Do not bring valuables to the hospital. [facility] is not responsible for any missing/lost belongings or valuables.   Total Shoulder Arthroplasty:  use Benzoyl Peroxide 5% Gel as directed on instruction sheet.  Notify your  doctor if there is any change in your medical condition (cold, fever, infection).  Wear comfortable clothing (specific to your surgery type) to the hospital.  After surgery, you can help prevent lung complications by doing breathing exercises.  Take deep breaths and cough every 1-2 hours. Your doctor Cuellar order a  device called an Incentive Spirometer to help you take deep breaths. When coughing or sneezing, hold a pillow firmly against your incision with both hands. This is called "splinting." Doing this helps protect your incision. It also decreases belly discomfort.  If you are being admitted to the hospital overnight, leave your suitcase in the car. After surgery it Crossen be brought to your room.  In case of increased patient census, it Rockman be necessary for you, the patient, to continue your postoperative care in the Same Day Surgery department.  If you are being discharged the day of surgery, you will not be allowed to drive home. You will need a responsible individual to drive you home and stay with you for 24 hours after surgery.   If you are taking public transportation, you will need to have a responsible individual with you.  Please call the Pre-admissions Testing Dept. at [phone] if you have any questions about these instructions.  Surgery Visitation Policy:  Patients having surgery or a procedure Candy have two visitors.  Children under the age of 48 must have an adult with them who is not the patient.  Inpatient Visitation:    Visiting hours are 7 a.m. to 8 p.m. Up to four visitors are allowed at one time in a patient room. The visitors Blasdell rotate out with other people during the day.  One visitor age 33 or older Dechellis stay with the patient overnight and must be in the room by 8 p.m.    Pre-operative 5 CHG Bath Instructions   You can play a key role in reducing the risk of infection after surgery. Your skin needs to be as free of germs as possible. You can reduce the number of germs on your skin by washing with CHG (chlorhexidine gluconate) soap before surgery. CHG is an antiseptic soap that kills germs and continues to kill germs even after washing.   DO NOT use if you have an allergy to chlorhexidine/CHG or antibacterial soaps. If your skin becomes reddened or irritated, stop  using the CHG and notify one of our RNs at [phone].   Please shower with the CHG soap starting 4 days before surgery using the following schedule:     Please keep in mind the following:  DO NOT shave, including legs and underarms, starting the day of your first shower.   You Manning shave your face at any point before/day of surgery.  Place clean sheets on your bed the day you start using CHG soap. Use a clean washcloth (not used since being washed) for each shower. DO NOT sleep with pets once you start using the CHG.   CHG Shower Instructions:  If you choose to wash your hair and private area, wash first with your normal shampoo/soap.  After you use shampoo/soap, rinse your hair and body thoroughly to remove shampoo/soap residue.  Turn the water OFF and apply about 3 tablespoons (45 ml) of CHG soap to a CLEAN washcloth.  Apply CHG soap ONLY FROM YOUR NECK DOWN TO YOUR TOES (washing for 3-5 minutes)  DO NOT use CHG soap on face, private areas, open wounds, or sores.  Pay special attention to the area  where your surgery is being performed.  If you are having back surgery, having someone wash your back for you Cristo be helpful. Wait 2 minutes after CHG soap is applied, then you Westerlund rinse off the CHG soap.  Pat dry with a clean towel  Put on clean clothes/pajamas   If you choose to wear lotion, please use ONLY the CHG-compatible lotions on the back of this paper.     Additional instructions for the day of surgery: DO NOT APPLY any lotions, deodorants, cologne, or perfumes.   Put on clean/comfortable clothes.  Brush your teeth.  Ask your nurse before applying any prescription medications to the skin.      CHG Compatible Lotions   Aveeno Moisturizing lotion  Cetaphil Moisturizing Cream  Cetaphil Moisturizing Lotion  Clairol Herbal Essence Moisturizing Lotion, Dry Skin  Clairol Herbal Essence Moisturizing Lotion, Extra Dry Skin  Clairol Herbal Essence Moisturizing Lotion, Normal  Skin  Curel Age Defying Therapeutic Moisturizing Lotion with Alpha Hydroxy  Curel Extreme Care Body Lotion  Curel Soothing Hands Moisturizing Hand Lotion  Curel Therapeutic Moisturizing Cream, Fragrance-Free  Curel Therapeutic Moisturizing Lotion, Fragrance-Free  Curel Therapeutic Moisturizing Lotion, Original Formula  Eucerin Daily Replenishing Lotion  Eucerin Dry Skin Therapy Plus Alpha Hydroxy Crme  Eucerin Dry Skin Therapy Plus Alpha Hydroxy Lotion  Eucerin Original Crme  Eucerin Original Lotion  Eucerin Plus Crme Eucerin Plus Lotion  Eucerin TriLipid Replenishing Lotion  Keri Anti-Bacterial Hand Lotion  Keri Deep Conditioning Original Lotion Dry Skin Formula Softly Scented  Keri Deep Conditioning Original Lotion, Fragrance Free Sensitive Skin Formula  Keri Lotion Fast Absorbing Fragrance Free Sensitive Skin Formula  Keri Lotion Fast Absorbing Softly Scented Dry Skin Formula  Keri Original Lotion  Keri Skin Renewal Lotion Keri Silky Smooth Lotion  Keri Silky Smooth Sensitive Skin Lotion  Nivea Body Creamy Conditioning Oil  Nivea Body Extra Enriched Lotion  Nivea Body Original Lotion  Nivea Body Sheer Moisturizing Lotion Nivea Crme  Nivea Skin Firming Lotion  NutraDerm 30 Skin Lotion  NutraDerm Skin Lotion  NutraDerm Therapeutic Skin Cream  NutraDerm Therapeutic Skin Lotion  ProShield Protective Hand Cream  Provon moisturizing lotion  Preparing for Total Shoulder Arthroplasty  Before surgery, you can play an important role by reducing the number of germs on your skin by using the following products:  Benzoyl Peroxide Gel  o Reduces the number of germs present on the skin  o Applied twice a day to shoulder area starting two days before surgery  Chlorhexidine Gluconate (CHG) Soap  o An antiseptic cleaner that kills germs and bonds with the skin to continue killing germs even after washing  o Used for showering the night before surgery and morning of  surgery  BENZOYL PEROXIDE 5% GEL  Please do not use if you have an allergy to benzoyl peroxide. If your skin becomes reddened/irritated stop using the benzoyl peroxide.  Starting two days before surgery, apply as follows:  1. Apply benzoyl peroxide in the morning and at night. Apply after taking a shower. If you are not taking a shower, clean entire shoulder front, back, and side along with the armpit with a clean wet washcloth.  2. Place a quarter-sized dollop on your shoulder and rub in thoroughly, making sure to cover the front, back, and side of your shoulder, along with the armpit.  2 days before ____ AM ____ PM 1 day before ____ AM ____ PM  3. Do this twice a day for two days. (Last application is  the night before surgery, AFTER using the CHG soap).  4. Do NOT apply benzoyl peroxide gel on the day of surgery.  How to Use an Incentive Spirometer An incentive spirometer is a tool that measures how well you are filling your lungs with each breath. Learning to take long, deep breaths using this tool can help you keep your lungs clear and active. This Corpus help to reverse or lessen your chance of developing breathing (pulmonary) problems, especially infection. You Utsey be asked to use a spirometer: After a surgery. If you have a lung problem or a history of smoking. After a long period of time when you have been unable to move or be active. If the spirometer includes an indicator to show the highest number that you have reached, your health care provider or respiratory therapist will help you set a goal. Keep a log of your progress as told by your health care provider. What are the risks? Breathing too quickly Dutko cause dizziness or cause you to pass out. Take your time so you do not get dizzy or light-headed. If you are in pain, you Phillips need to take pain medicine before doing incentive spirometry. It is harder to take a deep breath if you are having pain. How to use your incentive  spirometer  Sit up on the edge of your bed or on a chair. Hold the incentive spirometer so that it is in an upright position. Before you use the spirometer, breathe out normally. Place the mouthpiece in your mouth. Make sure your lips are closed tightly around it. Breathe in slowly and as deeply as you can through your mouth, causing the piston or the ball to rise toward the top of the chamber. Hold your breath for 3-5 seconds, or for as long as possible. If the spirometer includes a coach indicator, use this to guide you in breathing. Slow down your breathing if the indicator goes above the marked areas. Remove the mouthpiece from your mouth and breathe out normally. The piston or ball will return to the bottom of the chamber. Rest for a few seconds, then repeat the steps 10 or more times. Take your time and take a few normal breaths between deep breaths so that you do not get dizzy or light-headed. Do this every 1-2 hours when you are awake. If the spirometer includes a goal marker to show the highest number you have reached (best effort), use this as a goal to work toward during each repetition. After each set of 10 deep breaths, cough a few times. This will help to make sure that your lungs are clear. If you have an incision on your chest or abdomen from surgery, place a pillow or a rolled-up towel firmly against the incision when you cough. This can help to reduce pain while taking deep breaths and coughing. General tips When you are able to get out of bed: Walk around often. Continue to take deep breaths and cough in order to clear your lungs. Keep using the incentive spirometer until your health care provider says it is okay to stop using it. If you have been in the hospital, you Rovira be told to keep using the spirometer at home. Contact a health care provider if: You are having difficulty using the spirometer. You have trouble using the spirometer as often as instructed. Your pain  medicine is not giving enough relief for you to use the spirometer as told. You have a fever. Get help right away if:  You develop shortness of breath. You develop a cough with bloody mucus from the lungs. You have fluid or blood coming from an incision site after you cough. Summary An incentive spirometer is a tool that can help you learn to take long, deep breaths to keep your lungs clear and active. You Devery be asked to use a spirometer after a surgery, if you have a lung problem or a history of smoking, or if you have been inactive for a long period of time. Use your incentive spirometer as instructed every 1-2 hours while you are awake. If you have an incision on your chest or abdomen, place a pillow or a rolled-up towel firmly against your incision when you cough. This will help to reduce pain. Get help right away if you have shortness of breath, you cough up bloody mucus, or blood comes from your incision when you cough. This information is not intended to replace advice given to you by your health care provider. Make sure you discuss any questions you have with your health care provider. Document Revised: 07/26/2019 Document Reviewed: 07/26/2019 Elsevier Patient Education  2024 [employer].

## 2023-02-20 ENCOUNTER — Ambulatory Visit: Payer: Medicaid Other | Admitting: Family

## 2023-02-20 VITALS — BP 110/70 | HR 80 | Ht 67.0 in | Wt 253.4 lb

## 2023-02-20 DIAGNOSIS — R7303 Prediabetes: Secondary | ICD-10-CM | POA: Diagnosis not present

## 2023-02-20 DIAGNOSIS — Z1159 Encounter for screening for other viral diseases: Secondary | ICD-10-CM

## 2023-02-20 DIAGNOSIS — E538 Deficiency of other specified B group vitamins: Secondary | ICD-10-CM

## 2023-02-20 DIAGNOSIS — E559 Vitamin D deficiency, unspecified: Secondary | ICD-10-CM | POA: Diagnosis not present

## 2023-02-20 DIAGNOSIS — I10 Essential (primary) hypertension: Secondary | ICD-10-CM

## 2023-02-20 DIAGNOSIS — N3946 Mixed incontinence: Secondary | ICD-10-CM

## 2023-02-20 DIAGNOSIS — Z114 Encounter for screening for human immunodeficiency virus [HIV]: Secondary | ICD-10-CM

## 2023-02-20 DIAGNOSIS — D485 Neoplasm of uncertain behavior of skin: Secondary | ICD-10-CM

## 2023-02-20 DIAGNOSIS — M4696 Unspecified inflammatory spondylopathy, lumbar region: Secondary | ICD-10-CM

## 2023-02-20 DIAGNOSIS — E782 Mixed hyperlipidemia: Secondary | ICD-10-CM

## 2023-02-20 DIAGNOSIS — R5383 Other fatigue: Secondary | ICD-10-CM

## 2023-02-20 DIAGNOSIS — F3162 Bipolar disorder, current episode mixed, moderate: Secondary | ICD-10-CM

## 2023-02-20 LAB — POC CREATINE & ALBUMIN,URINE
Albumin/Creatinine Ratio, Urine, POC: <30
Creatinine, POC: 80 mg/dL

## 2023-02-20 MED ORDER — HYDROCODONE-ACETAMINOPHEN 5-325 MG PO TABS
1.0000 | ORAL_TABLET | Freq: Four times a day (QID) | ORAL | 0 refills | Status: AC | PRN
Start: 2023-02-20 — End: 2023-02-25

## 2023-02-20 MED ORDER — GEMTESA 75 MG PO TABS: 75.0000 mg | ORAL_TABLET | Freq: Every day | ORAL | 5 refills | Status: DC

## 2023-02-20 NOTE — Telephone Encounter (Signed)
Will discuss at visit

## 2023-02-20 NOTE — Progress Notes (Signed)
Established Patient Office Visit  Subjective:  Patient ID: Shelly Wright, female    DOB: October 20, 1970  Age: 52 y.o. MRN: [id number]  Chief Complaint  Patient presents with   Follow-up    Discuss derm referral    Needs dermatology referral - skin in groin/perineal area - has multiple moles which get irritated because of her using incontinence products.  Also has concerns with her facial skin, says that she has "pockets" under her skin that will sometimes "leak clear stuff and blood and that's it".   Is set up for her surgery for her shoulder, having significant pain.  Needs her gemtesa back,  Asks if we can increase her dose for her ozempic.   No other concerns today.       No other concerns at this time.   Past Medical History:  Diagnosis Date   Abnormal EKG    Acute right-sided low back pain with right-sided sciatica 08/30/2014   Anxiety    Arthritis    Asthma    in past, no current inhalers   Bipolar disorder (HCC)    Breathlessness on exertion 04/20/2014   Cannabis use disorder    Cervical spinal cord compression (HCC) 05/07/2012   Chronic pain syndrome    COPD (chronic obstructive pulmonary disease) (HCC)    DDD (degenerative disc disease), lumbar    Depression    DOE (dyspnea on exertion)    Dysrhythmia    irregular-h/o in the past   Extreme obesity    Fibromyalgia    H/O syncope 04/25/2014   High risk medication use 06/23/2019   Hyperlipidemia    Hypertension    Hypokalemia    Hyponatremia 05/07/2021   Long term current use of anticoagulant therapy (Lovenox) 05/06/2016   Lumbar canal stenosis    Marijuana smoker    Neuritis or radiculitis due to rupture of lumbar intervertebral disc 05/23/2014   Noncompliance with treatment regimen 05/01/2020   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   a.) presurgical PCR (+) 02/02/2019 prior to LEFT TKA; b.) presurgical PCR (+) 03/06/2019 prior to RIGHT REVERSE SHOULDER/BICEPS TENODESIS; c.) presurgical PCR (+)  01/17/2022 prior to LEFT SHOULDER ARTHROSCOPY/OPEN ROTATOR CUFF REPAIR/SUBACROMIAL DECOMPRESSION/DISTAL CLAVICLE EXCISION/OPEN BICEPS TENODESIS   Partner relational problem 10/05/2021   Periprosthetic fracture around prosthetic knee 07/31/2015   Personal history of perinatal problems 04/25/2014   Pharmacologic therapy 11/30/2019   Pre-diabetes    Restless leg syndrome    Soft tissue lesion of shoulder region 01/24/2012   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   Syncope and collapse 04/08/2014   last was 2023   Thoracic and lumbosacral neuritis 05/07/2012   Thoracic neuritis 05/07/2012   Tobacco use disorder    Vitamin D deficiency     Past Surgical History:  Procedure Laterality Date   ABDOMINAL HYSTERECTOMY     Cervical Fusion X 2     JOINT REPLACEMENT Right 01/2016   Dr Gavin Potters   KNEE ARTHROPLASTY Left 02/10/2019   Procedure: COMPUTER ASSISTED TOTAL KNEE ARTHROPLASTY - RNFA;  Surgeon: Donato Heinz, MD;  Location: [facility] ORS;  Service: Orthopedics;  Laterality: Left;   KNEE ARTHROSCOPY Bilateral    Right knee scope 1998, Left knee scope   REVERSE SHOULDER ARTHROPLASTY Right 03/12/2021   Procedure: Right reverse shoulder arthroplasty, biceps tenodesis;  Surgeon: Signa Kell, MD;  Location: [facility] ORS;  Service: Orthopedics;  Laterality: Right;   SHOULDER ARTHROSCOPY WITH SUBACROMIAL DECOMPRESSION  AND OPEN ROTATOR C Left 06/18/2021   Procedure: Left shoulder arthroscopy, Open rotator cuff repair, subacromial decompression, distal clavicle excision and, open biceps tenodesis;  Surgeon: Signa Kell, MD;  Location: [facility] ORS;  Service: Orthopedics;  Laterality: Left;   TONSILLECTOMY     TOTAL KNEE ARTHROPLASTY Right 02/01/2015   Procedure: TOTAL KNEE ARTHROPLASTY;  Surgeon: Erin Sons, MD;  Location: [facility] ORS;  Service: Orthopedics;  Laterality: Right;   TUBAL LIGATION      Social History    Socioeconomic History   Marital status: Married    Spouse name: Molly Maduro   Number of children: 1   Years of education: Not on file   Highest education level: High school graduate  Occupational History   Not on file  Tobacco Use   Smoking status: Every Day    Current packs/day: 0.00    Average packs/day: 1 pack/day for 30.0 years (30.0 ttl pk-yrs)    Types: Cigarettes    Start date: 09/12/1991    Last attempt to quit: 09/11/2021    Years since quitting: 1.4   Smokeless tobacco: Never  Vaping Use   Vaping status: Never Used  Substance and Sexual Activity   Alcohol use: No   Drug use: Yes    Types: Marijuana    Comment: every day   Sexual activity: Not Currently  Other Topics Concern   Not on file  Social History Narrative   Not on file   Social Determinants of Health   Financial Resource Strain: Low Risk  (09/16/2018)   Overall Financial Resource Strain (CARDIA)    Difficulty of Paying Living Expenses: Not hard at all  Food Insecurity: No Food Insecurity (09/16/2018)   Hunger Vital Sign    Worried About Running Out of Food in the Last Year: Never true    Ran Out of Food in the Last Year: Never true  Transportation Needs: No Transportation Needs (09/16/2018)   PRAPARE - Administrator, Civil Service (Medical): No    Lack of Transportation (Non-Medical): No  Physical Activity: Inactive (09/16/2018)   Exercise Vital Sign    Days of Exercise per Week: 0 days    Minutes of Exercise per Session: 0 min  Stress: Stress Concern Present (09/16/2018)   [employer] of Occupational Health - Occupational Stress Questionnaire    Feeling of Stress : To some extent  Social Connections: Unknown (09/16/2018)   Social Connection and Isolation Panel [NHANES]    Frequency of Communication with Friends and Family: Not on file    Frequency of Social Gatherings with Friends and Family: Not on file    Attends Religious Services: Never    Active Member of Clubs or Organizations:  No    Attends Banker Meetings: Never    Marital Status: Separated  Intimate Partner Violence: Not At Risk (09/16/2018)   Humiliation, Afraid, Rape, and Kick questionnaire    Fear of Current or Ex-Partner: No    Emotionally Abused: No    Physically Abused: No    Sexually Abused: No    Family History  Problem Relation Age of Onset   Breast cancer Mother 41   Colon cancer Father    Heart attack Father    Bipolar disorder Sister    Depression Sister    Schizophrenia Sister     No Known Allergies  ROS     Objective:   BP 110/70   Pulse 80   Ht 5\' 7"  (1.702 m)   Wt 253  lb 6.4 oz (114.9 kg)   SpO2 96%   BMI 39.69 kg/m   Vitals:   02/20/23 0931  BP: 110/70  Pulse: 80  Height: 5\' 7"  (1.702 m)  Weight: 253 lb 6.4 oz (114.9 kg)  SpO2: 96%  BMI (Calculated): 39.68    Physical Exam   Results for orders placed or performed in visit on 02/20/23  Lipid panel  Result Value Ref Range   Cholesterol, Total 170 100 - 199 mg/dL   Triglycerides 151 0 - 149 mg/dL   HDL 41 >76 mg/dL   VLDL Cholesterol Cal 26 5 - 40 mg/dL   LDL Chol Calc (NIH) 160 (H) 0 - 99 mg/dL   Chol/HDL Ratio 4.1 0.0 - 4.4 ratio  VITAMIN D 25 Hydroxy (Vit-D Deficiency, Fractures)  Result Value Ref Range   Vit D, 25-Hydroxy 42.8 30.0 - 100.0 ng/mL  CMP14+EGFR  Result Value Ref Range   Glucose 97 70 - 99 mg/dL   BUN 17 6 - 24 mg/dL   Creatinine, Ser 7.37 (H) 0.57 - 1.00 mg/dL   eGFR 64 >10 GY/IRS/8.54   BUN/Creatinine Ratio 16 9 - 23   Sodium 139 134 - 144 mmol/L   Potassium 4.7 3.5 - 5.2 mmol/L   Chloride 101 96 - 106 mmol/L   CO2 25 20 - 29 mmol/L   Calcium 9.9 8.7 - 10.2 mg/dL   Total Protein 6.7 6.0 - 8.5 g/dL   Albumin 4.4 3.8 - 4.9 g/dL   Globulin, Total 2.3 1.5 - 4.5 g/dL   Bilirubin Total 0.3 0.0 - 1.2 mg/dL   Alkaline Phosphatase 97 44 - 121 IU/L   AST 18 0 - 40 IU/L   ALT 18 0 - 32 IU/L  TSH  Result Value Ref Range   TSH 0.531 0.450 - 4.500 uIU/mL  Hemoglobin A1c   Result Value Ref Range   Hgb A1c MFr Bld 6.1 (H) 4.8 - 5.6 %   Est. average glucose Bld gHb Est-mCnc 128 mg/dL  Vitamin O27  Result Value Ref Range   Vitamin B-12 438 232 - 1,245 pg/mL  Hepatitis C Ab reflex to Quant PCR  Result Value Ref Range   HCV Ab Non Reactive Non Reactive  HIV antibody (with reflex)  Result Value Ref Range   HIV Screen 4th Generation wRfx Non Reactive Non Reactive  Interpretation:  Result Value Ref Range   HCV Interp 1: Comment   POC CREATINE & ALBUMIN,URINE  Result Value Ref Range   Creatinine, POC 80 mg/dL   Albumin/Creatinine Ratio, Urine, POC <30     Recent Results (from the past 2160 hour(s))  Hemoglobin A1c     Status: Abnormal   Collection Time: 02/19/23 10:45 AM  Result Value Ref Range   Hgb A1c MFr Bld 6.1 (H) 4.8 - 5.6 %    Comment: (NOTE) Pre diabetes:          5.7%-6.4%  Diabetes:              >6.4%  Glycemic control for   <7.0% adults with diabetes    Mean Plasma Glucose 128.37 mg/dL    Comment: Performed at [facility], [address] [place], [place] [address]  CBC WITH DIFFERENTIAL     Status: None   Collection Time: 02/19/23 10:45 AM  Result Value Ref Range   WBC 7.3 4.0 - 10.5 K/uL   RBC 4.76 3.87 - 5.11 MIL/uL   Hemoglobin 14.7 12.0 - 15.0 g/dL   HCT  42.0 36.0 - 46.0 %   MCV 88.2 80.0 - 100.0 fL   MCH 30.9 26.0 - 34.0 pg   MCHC 35.0 30.0 - 36.0 g/dL   RDW 19.1 47.8 - 29.5 %   Platelets 242 150 - 400 K/uL   nRBC 0.0 0.0 - 0.2 %   Neutrophils Relative % 60 %   Neutro Abs 4.3 1.7 - 7.7 K/uL   Lymphocytes Relative 31 %   Lymphs Abs 2.3 0.7 - 4.0 K/uL   Monocytes Relative 6 %   Monocytes Absolute 0.4 0.1 - 1.0 K/uL   Eosinophils Relative 2 %   Eosinophils Absolute 0.2 0.0 - 0.5 K/uL   Basophils Relative 1 %   Basophils Absolute 0.0 0.0 - 0.1 K/uL   Immature Granulocytes 0 %   Abs Immature Granulocytes 0.02 0.00 - 0.07 K/uL    Comment: Performed at [facility], [address] [place], [place]  [address]  Comprehensive metabolic panel     Status: Abnormal   Collection Time: 02/19/23 10:45 AM  Result Value Ref Range   Sodium 136 135 - 145 mmol/L   Potassium 4.1 3.5 - 5.1 mmol/L   Chloride 104 98 - 111 mmol/L   CO2 27 22 - 32 mmol/L   Glucose, Bld 107 (H) 70 - 99 mg/dL    Comment: Glucose reference range applies only to samples taken after fasting for at least 8 hours.   BUN 16 6 - 20 mg/dL   Creatinine, Ser 8.65 (H) 0.44 - 1.00 mg/dL   Calcium 9.2 8.9 - 78.4 mg/dL   Total Protein 7.2 6.5 - 8.1 g/dL   Albumin 4.3 3.5 - 5.0 g/dL   AST 22 15 - 41 U/L   ALT 20 0 - 44 U/L   Alkaline Phosphatase 79 38 - 126 U/L   Total Bilirubin 0.6 0.3 - 1.2 mg/dL   GFR, Estimated >69 >62 mL/min    Comment: (NOTE) Calculated using the CKD-EPI Creatinine Equation (2021)    Anion gap 5 5 - 15    Comment: Performed at [facility], [address] [place], [place] [address]  Urinalysis, Routine w reflex microscopic -Urine, Clean Catch     Status: Abnormal   Collection Time: 02/19/23 10:45 AM  Result Value Ref Range   Color, Urine STRAW (A) YELLOW   APPearance CLEAR (A) CLEAR   Specific Gravity, Urine 1.004 (L) 1.005 - 1.030   pH 6.0 5.0 - 8.0   Glucose, UA NEGATIVE NEGATIVE mg/dL   Hgb urine dipstick NEGATIVE NEGATIVE   Bilirubin Urine NEGATIVE NEGATIVE   Ketones, ur NEGATIVE NEGATIVE mg/dL   Protein, ur NEGATIVE NEGATIVE mg/dL   Nitrite NEGATIVE NEGATIVE   Leukocytes,Ua NEGATIVE NEGATIVE    Comment: Performed at [facility], [address] [place], [place] [address]  Type and screen Order type and screen if day of surgery is less than 15 days from draw of preadmission visit or order morning of surgery if day of surgery is greater than 6 days from preadmission visit.     Status: None   Collection Time: 02/19/23 10:45 AM  Result Value Ref Range   ABO/RH(D) AB POS    Antibody Screen NEG    Sample Expiration 03/05/2023,2359    Extend sample reason      NO TRANSFUSIONS OR  PREGNANCY IN THE PAST 3 MONTHS Performed at [facility], [address] [place], [place] [address]   Surgical pcr screen     Status:  None   Collection Time: 02/19/23 10:46 AM   Specimen: Nasal Mucosa; Nasal Swab  Result Value Ref Range   MRSA, PCR NEGATIVE NEGATIVE   Staphylococcus aureus NEGATIVE NEGATIVE    Comment: (NOTE) The Xpert SA Assay (FDA approved for NASAL specimens in patients 9 years of age and older), is one component of a comprehensive surveillance program. It is not intended to diagnose infection nor to guide or monitor treatment. Performed at [facility], [address] [place], [place] [address]   Lipid panel     Status: Abnormal   Collection Time: 02/20/23 10:17 AM  Result Value Ref Range   Cholesterol, Total 170 100 - 199 mg/dL   Triglycerides 151 0 - 149 mg/dL   HDL 41 >76 mg/dL   VLDL Cholesterol Cal 26 5 - 40 mg/dL   LDL Chol Calc (NIH) 160 (H) 0 - 99 mg/dL   Chol/HDL Ratio 4.1 0.0 - 4.4 ratio    Comment:                                   T. Chol/HDL Ratio                                             Men  Women                               1/2 Avg.Risk  3.4    3.3                                   Avg.Risk  5.0    4.4                                2X Avg.Risk  9.6    7.1                                3X Avg.Risk 23.4   11.0   VITAMIN D 25 Hydroxy (Vit-D Deficiency, Fractures)     Status: None   Collection Time: 02/20/23 10:17 AM  Result Value Ref Range   Vit D, 25-Hydroxy 42.8 30.0 - 100.0 ng/mL    Comment: Vitamin D deficiency has been defined by the Institute of Medicine and an Endocrine Society practice guideline as a level of serum 25-OH vitamin D less than 20 ng/mL (1,2). The Endocrine Society went on to further define vitamin D insufficiency as a level between 21 and 29 ng/mL (2). 1. IOM (Institute of Medicine). 2010. Dietary reference    intakes for calcium and D. [place]: The    [employer]. 2.  Holick MF, Binkley , Bischoff-Ferrari HA, et al.    Evaluation, treatment, and prevention of vitamin D    deficiency: an Endocrine Society clinical practice    guideline. JCEM. 2011 Jul; 96(7):1911-30.   CMP14+EGFR     Status: Abnormal   Collection Time: 02/20/23 10:17 AM  Result Value Ref Range   Glucose 97 70 - 99 mg/dL   BUN 17 6 - 24 mg/dL   Creatinine, Ser  1.04 (H) 0.57 - 1.00 mg/dL   eGFR 64 >82 NF/AOZ/3.08   BUN/Creatinine Ratio 16 9 - 23   Sodium 139 134 - 144 mmol/L   Potassium 4.7 3.5 - 5.2 mmol/L   Chloride 101 96 - 106 mmol/L   CO2 25 20 - 29 mmol/L   Calcium 9.9 8.7 - 10.2 mg/dL   Total Protein 6.7 6.0 - 8.5 g/dL   Albumin 4.4 3.8 - 4.9 g/dL   Globulin, Total 2.3 1.5 - 4.5 g/dL   Bilirubin Total 0.3 0.0 - 1.2 mg/dL   Alkaline Phosphatase 97 44 - 121 IU/L   AST 18 0 - 40 IU/L   ALT 18 0 - 32 IU/L  TSH     Status: None   Collection Time: 02/20/23 10:17 AM  Result Value Ref Range   TSH 0.531 0.450 - 4.500 uIU/mL  Hemoglobin A1c     Status: Abnormal   Collection Time: 02/20/23 10:17 AM  Result Value Ref Range   Hgb A1c MFr Bld 6.1 (H) 4.8 - 5.6 %    Comment:          Prediabetes: 5.7 - 6.4          Diabetes: >6.4          Glycemic control for adults with diabetes: <7.0    Est. average glucose Bld gHb Est-mCnc 128 mg/dL  Vitamin M57     Status: None   Collection Time: 02/20/23 10:17 AM  Result Value Ref Range   Vitamin B-12 438 232 - 1,245 pg/mL  Hepatitis C Ab reflex to Quant PCR     Status: None   Collection Time: 02/20/23 10:17 AM  Result Value Ref Range   HCV Ab Non Reactive Non Reactive  HIV antibody (with reflex)     Status: None   Collection Time: 02/20/23 10:17 AM  Result Value Ref Range   HIV Screen 4th Generation wRfx Non Reactive Non Reactive    Comment: HIV-1/HIV-2 antibodies and HIV-1 p24 antigen were NOT detected. There is no laboratory evidence of HIV infection. HIV Negative   Interpretation:     Status: None   Collection Time: 02/20/23  10:17 AM  Result Value Ref Range   HCV Interp 1: Comment     Comment: Not infected with HCV unless early or acute infection is suspected (which [place] be delayed in an immunocompromised individual), or other evidence exists to indicate HCV infection.   POC CREATINE & ALBUMIN,URINE     Status: None   Collection Time: 02/20/23 11:42 AM  Result Value Ref Range   Creatinine, POC 80 mg/dL   Albumin/Creatinine Ratio, Urine, POC <30        Assessment & Plan:   Problem List Items Addressed This Visit       Active Problems   Essential hypertension, benign    Blood pressure well controlled with current medications.  Continue current therapy.  Will reassess at follow up.       Relevant Orders   CMP14+EGFR (Completed)   CBC with Differential/Platelet   Vitamin D deficiency, unspecified - Primary    Checking labs today.  Will continue supplements as needed.       Relevant Orders   VITAMIN D 25 Hydroxy (Vit-D Deficiency, Fractures) (Completed)   CMP14+EGFR (Completed)   CBC with Differential/Platelet   Hyperlipidemia    Checking labs today.  Continue current therapy for lipid control. Will modify as needed based on labwork results.       Relevant  Orders   Lipid panel (Completed)   CMP14+EGFR (Completed)   CBC with Differential/Platelet   Bipolar disorder, current episode mixed, moderate (HCC)    Patient is seen by psychiatry, who manage this condition.  She is well controlled with current therapy.   Will defer to them for further changes to plan of care.       Unspecified inflammatory spondylopathy, lumbar region [facility])    Patient is seen by Ortho, who manage this condition.  She is well controlled with current therapy.   Will defer to them for further changes to plan of care.       Mixed stress and urge urinary incontinence    Will send refills for patient for Gemtesa, as this has been the only effective medication in the past for her urinary incontinence.   Reassess  at follow up.       Relevant Medications   Vibegron (GEMTESA) 75 MG TABS   Prediabetes    A1C Continues to be in prediabetic ranges.  Will reassess at follow up after next lab check.  Patient counseled on dietary choices and verbalized understanding.  Patient educated on foods that contain carbohydrates and the need to decrease intake.  We discussed prediabetes, and what it means and the need for strict dietary control to prevent progression to type 2 diabetes.  Advised to decrease intake of sugary drinks, including sodas, sweet tea, and some juices, and of starch and sugar heavy foods (ie., potatoes, rice, bread, pasta, desserts). She verbalizes understanding and agreement with the changes discussed today.        Relevant Orders   CMP14+EGFR (Completed)   Hemoglobin A1c (Completed)   CBC with Differential/Platelet   POC CREATINE & ALBUMIN,URINE (Completed)   Other Visit Diagnoses     B12 deficiency due to diet       Relevant Orders   CMP14+EGFR (Completed)   Vitamin B12 (Completed)   CBC with Differential/Platelet   Other fatigue       Relevant Orders   CMP14+EGFR (Completed)   TSH (Completed)   CBC with Differential/Platelet   Screening for HIV without presence of risk factors       test ordered today, will call with results   Relevant Orders   CMP14+EGFR (Completed)   CBC with Differential/Platelet   HIV antibody (with reflex) (Completed)   Need for hepatitis C screening test       test ordered today, will call with results   Relevant Orders   CMP14+EGFR (Completed)   CBC with Differential/Platelet   Hepatitis C Ab reflex to Quant PCR (Completed)   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  Will defer to them for further treatment changes.   Relevant Orders   Ambulatory referral to Dermatology       Return in about 3 months (around 05/23/2023) for F/U.   Total time spent: 30 minutes  Miki Kins, FNP  02/20/2023   This document [place] have been prepared by [facility] Voice Recognition software and as such [place] include unintentional dictation errors.

## 2023-02-21 LAB — LIPID PANEL
Chol/HDL Ratio: 4.1 {ratio} (ref 0.0–4.4)
Cholesterol, Total: 170 mg/dL (ref 100–199)
HDL: 41 mg/dL (ref >39)
LDL Chol Calc (NIH): 103 mg/dL — ABNORMAL HIGH (ref 0–99)
Triglycerides: 149 mg/dL (ref 0–149)
VLDL Cholesterol Cal: 26 mg/dL (ref 5–40)

## 2023-02-21 LAB — CMP14+EGFR
ALT: 18 [IU]/L (ref 0–32)
AST: 18 [IU]/L (ref 0–40)
Albumin: 4.4 g/dL (ref 3.8–4.9)
Alkaline Phosphatase: 97 [IU]/L (ref 44–121)
BUN/Creatinine Ratio: 16 (ref 9–23)
BUN: 17 mg/dL (ref 6–24)
Bilirubin Total: 0.3 mg/dL (ref 0.0–1.2)
CO2: 25 mmol/L (ref 20–29)
Calcium: 9.9 mg/dL (ref 8.7–10.2)
Chloride: 101 mmol/L (ref 96–106)
Creatinine, Ser: 1.04 mg/dL — ABNORMAL HIGH (ref 0.57–1.00)
Globulin, Total: 2.3 g/dL (ref 1.5–4.5)
Glucose: 97 mg/dL (ref 70–99)
Potassium: 4.7 mmol/L (ref 3.5–5.2)
Sodium: 139 mmol/L (ref 134–144)
Total Protein: 6.7 g/dL (ref 6.0–8.5)
eGFR: 64 mL/min/{1.73_m2} (ref >59)

## 2023-02-21 LAB — HCV INTERPRETATION

## 2023-02-21 LAB — VITAMIN B12: Vitamin B-12: 438 pg/mL (ref 232–1245)

## 2023-02-21 LAB — HIV ANTIBODY (ROUTINE TESTING W REFLEX): HIV Screen 4th Generation wRfx: NONREACTIVE

## 2023-02-21 LAB — HCV AB W REFLEX TO QUANT PCR: HCV Ab: NONREACTIVE

## 2023-02-21 LAB — TSH: TSH: 0.531 u[IU]/mL (ref 0.450–4.500)

## 2023-02-21 LAB — HEMOGLOBIN A1C
Est. average glucose Bld gHb Est-mCnc: 128 mg/dL
Hgb A1c MFr Bld: 6.1 % — ABNORMAL HIGH (ref 4.8–5.6)

## 2023-02-21 LAB — VITAMIN D 25 HYDROXY (VIT D DEFICIENCY, FRACTURES): Vit D, 25-Hydroxy: 42.8 ng/mL (ref 30.0–100.0)

## 2023-02-22 NOTE — Progress Notes (Signed)
Perioperative Services Pre-Admission/Anesthesia Testing    Date: 02/22/23  Name: Shelly Wright MRN:   [id number]  Re: GLP-1 clearance and provider recommendations   Planned Surgical Procedure(s):    Case: [id number] Date/Time: 03/04/23 1200   Procedures:      Left reverse shoulder arthroplasty, biceps tenodesis; open distal clavicle excision and AC joint cyst excision (Left: Shoulder)     Left reverse shoulder arthroplasty, biceps tenodesis; open distal clavicle excision and AC joint cyst excision (Left: Shoulder)     Left reverse shoulder arthroplasty, biceps tenodesis; open distal clavicle excision and AC joint cyst excision (Left: Shoulder)   Anesthesia type: Choice   Pre-op diagnosis: Complete tear of left rotator cuff, unspecified whether traumatic M75.122   Location: [facility] OR ROOM 01 / [facility] AN[facility]   Surgeons: Signa Kell, MD      Clinical Notes:  Patient is scheduled for the above procedure with the indicated provider/surgeon. In review of her medication reconciliation it was noted that patient is on a prescribed GLP-1 medication. Per guidelines issued by the American Society of Anesthesiologists (ASA), it is recommended that these medications be held for 7 days prior to the patient undergoing any type of elective surgical procedure. The patient is taking the following GLP-1 medication:  [x]  SEMAGLUTIDE   []  EXENATIDE  []  LIRAGLUTIDE   []  LIXISENATIDE  []  DULAGLUTIDE     []  TIRZEPATIDE (GLP-1/GIP)  Reached out to prescribing provider Shelly Forest, FNP-C) to make them aware of the guidelines from anesthesia. Given that this patient takes the prescribed GLP-1 medication for her  diabetes diagnosis, rather than for weight loss, recommendations from the prescribing provider were solicited. Prescribing provider made aware of the following so that informed decision/POC can be developed for this patient that [name] be taking medications belonging to these drug  classes:  Oral GLP-1 medications will be held 1 day prior to surgery.  Injectable GLP-1 medications will be held 7 days prior to surgery.  Metformin is routinely held 48 hours prior to surgery due to renal concerns, potential need for contrasted imaging perioperatively, and the potential for tissue hypoxia leading to drug induced lactic acidosis.  All SGLT2i medications are held 72 hours prior to surgery as they can be associated with the increased potential for developing euglycemic diabetic ketoacidosis (EDKA).   Impression and Plan:  Shelly Wright is on a prescribed GLP-1 medication, which induces the known side effect of decreased gastric emptying. Efforts are bring made to mitigate the risk of perioperative hyperglycemic events, as elevated blood glucose levels have been found to contribute to intra/postoperative complications. Additionally, hyperglycemic extremes can potentially necessitate the postponing of a patient's elective case in order to better optimize perioperative glycemic control, again with the aforementioned guidelines in place. With this in mind, recommendations have been sought from the prescribing provider, who has cleared patient to proceed with holding the prescribed GLP-1 as per the guidelines from the ASA.   Provider recommending: no further recommendations received from the prescribing provider.  Copy of signed clearance and recommendations placed on patient's chart for inclusion in their medical record and for review by the surgical/anesthetic team on the day of her procedure.   [name], MSN, APRN, FNP-C, CEN [facility]  Perioperative Services Nurse Practitioner Phone: [phone] Fax: [phone] 02/22/23 4:10 PM  NOTE: This note has been prepared using Dragon dictation software. Despite my best ability to proofread, there is always the potential that unintentional transcriptional errors [name] still  occur from this process.

## 2023-03-02 ENCOUNTER — Encounter: Payer: Self-pay | Admitting: Family

## 2023-03-02 DIAGNOSIS — R7303 Prediabetes: Secondary | ICD-10-CM | POA: Insufficient documentation

## 2023-03-02 DIAGNOSIS — N3946 Mixed incontinence: Secondary | ICD-10-CM | POA: Insufficient documentation

## 2023-03-02 NOTE — Assessment & Plan Note (Signed)
Blood pressure well controlled with current medications.  Continue current therapy.  Will reassess at follow up.

## 2023-03-02 NOTE — Assessment & Plan Note (Signed)
Checking labs today.  Continue current therapy for lipid control. Will modify as needed based on labwork results.  

## 2023-03-02 NOTE — Assessment & Plan Note (Signed)
A1C Continues to be in prediabetic ranges.  Will reassess at follow up after next lab check.  Patient counseled on dietary choices and verbalized understanding.  Patient educated on foods that contain carbohydrates and the need to decrease intake.  We discussed prediabetes, and what it means and the need for strict dietary control to prevent progression to type 2 diabetes.  Advised to decrease intake of sugary drinks, including sodas, sweet tea, and some juices, and of starch and sugar heavy foods (ie., potatoes, rice, bread, pasta, desserts). She verbalizes understanding and agreement with the changes discussed today.

## 2023-03-02 NOTE — Assessment & Plan Note (Signed)
Patient is seen by Ortho, who manage this condition.  She is well controlled with current therapy.   Will defer to them for further changes to plan of care.

## 2023-03-02 NOTE — Assessment & Plan Note (Signed)
Patient is seen by psychiatry, who manage this condition.  She is well controlled with current therapy.   Will defer to them for further changes to plan of care.

## 2023-03-02 NOTE — Assessment & Plan Note (Signed)
Checking labs today.  Will continue supplements as needed.  

## 2023-03-02 NOTE — Assessment & Plan Note (Signed)
Will send refills for patient for Gemtesa, as this has been the only effective medication in the past for her urinary incontinence.   Reassess at follow up.

## 2023-03-04 ENCOUNTER — Ambulatory Visit
Admission: RE | Admit: 2023-03-04 | Discharge: 2023-03-04 | Disposition: A | Discharge status: Home / Self Care | Payer: Medicaid Other | Attending: Orthopedic Surgery | Admitting: Orthopedic Surgery

## 2023-03-04 ENCOUNTER — Ambulatory Visit: Payer: Medicaid Other

## 2023-03-04 ENCOUNTER — Encounter
Admission: RE | Disposition: A | Discharge status: Home / Self Care | Payer: Self-pay | Source: Home / Self Care | Attending: Orthopedic Surgery

## 2023-03-04 ENCOUNTER — Other Ambulatory Visit: Payer: Self-pay

## 2023-03-04 ENCOUNTER — Ambulatory Visit: Payer: Medicaid Other | Admitting: Urgent Care

## 2023-03-04 ENCOUNTER — Encounter: Payer: Self-pay | Admitting: Orthopedic Surgery

## 2023-03-04 DIAGNOSIS — F319 Bipolar disorder, unspecified: Secondary | ICD-10-CM | POA: Diagnosis not present

## 2023-03-04 DIAGNOSIS — M67412 Ganglion, left shoulder: Secondary | ICD-10-CM | POA: Diagnosis not present

## 2023-03-04 DIAGNOSIS — M75122 Complete rotator cuff tear or rupture of left shoulder, not specified as traumatic: Secondary | ICD-10-CM | POA: Insufficient documentation

## 2023-03-04 DIAGNOSIS — J4489 Other specified chronic obstructive pulmonary disease: Secondary | ICD-10-CM | POA: Diagnosis not present

## 2023-03-04 DIAGNOSIS — F419 Anxiety disorder, unspecified: Secondary | ICD-10-CM | POA: Diagnosis not present

## 2023-03-04 DIAGNOSIS — M1990 Unspecified osteoarthritis, unspecified site: Secondary | ICD-10-CM | POA: Diagnosis not present

## 2023-03-04 DIAGNOSIS — Z79891 Long term (current) use of opiate analgesic: Secondary | ICD-10-CM | POA: Insufficient documentation

## 2023-03-04 DIAGNOSIS — I10 Essential (primary) hypertension: Secondary | ICD-10-CM | POA: Insufficient documentation

## 2023-03-04 DIAGNOSIS — F17210 Nicotine dependence, cigarettes, uncomplicated: Secondary | ICD-10-CM | POA: Insufficient documentation

## 2023-03-04 DIAGNOSIS — M797 Fibromyalgia: Secondary | ICD-10-CM | POA: Diagnosis not present

## 2023-03-04 DIAGNOSIS — Z01818 Encounter for other preprocedural examination: Secondary | ICD-10-CM

## 2023-03-04 HISTORY — PX: BICEPT TENODESIS: SHX5116

## 2023-03-04 HISTORY — PX: RESECTION DISTAL CLAVICAL: SHX5053

## 2023-03-04 HISTORY — PX: REVERSE SHOULDER ARTHROPLASTY: SHX5054

## 2023-03-04 SURGERY — ARTHROPLASTY, SHOULDER, TOTAL, REVERSE
Anesthesia: General | Site: Shoulder | Laterality: Left

## 2023-03-04 MED ORDER — CEFAZOLIN SODIUM-DEXTROSE 2-4 GM/100ML-% IV SOLN
INTRAVENOUS | Status: AC
  Filled 2023-03-04: qty 100

## 2023-03-04 MED ORDER — CHLORHEXIDINE GLUCONATE 0.12 % MT SOLN
OROMUCOSAL | Status: AC
  Filled 2023-03-04: qty 15

## 2023-03-04 MED ORDER — LIDOCAINE HCL (CARDIAC) PF 100 MG/5ML IV SOSY
PREFILLED_SYRINGE | INTRAVENOUS | Status: DC | PRN
  Administered 2023-03-04: 100 mg via INTRAVENOUS

## 2023-03-04 MED ORDER — EPHEDRINE 5 MG/ML INJ
INTRAVENOUS | Status: AC
  Filled 2023-03-04: qty 5

## 2023-03-04 MED ORDER — VANCOMYCIN HCL 1000 MG IV SOLR
INTRAVENOUS | Status: DC | PRN
  Administered 2023-03-04: 1000 mg via TOPICAL

## 2023-03-04 MED ORDER — DEXMEDETOMIDINE HCL IN NACL 80 MCG/20ML IV SOLN
INTRAVENOUS | Status: AC
  Filled 2023-03-04: qty 20

## 2023-03-04 MED ORDER — MIDAZOLAM HCL 2 MG/2ML IJ SOLN
INTRAMUSCULAR | Status: AC
  Filled 2023-03-04: qty 2

## 2023-03-04 MED ORDER — PHENYLEPHRINE 80 MCG/ML (10ML) SYRINGE FOR IV PUSH (FOR BLOOD PRESSURE SUPPORT)
PREFILLED_SYRINGE | INTRAVENOUS | Status: DC | PRN
  Administered 2023-03-04: 80 ug via INTRAVENOUS

## 2023-03-04 MED ORDER — BUPIVACAINE LIPOSOME 1.3 % IJ SUSP
INTRAMUSCULAR | Status: AC
  Filled 2023-03-04: qty 20

## 2023-03-04 MED ORDER — MIDAZOLAM HCL 2 MG/2ML IJ SOLN
1.0000 mg | INTRAMUSCULAR | Status: DC | PRN
  Administered 2023-03-04: 2 mg via INTRAVENOUS

## 2023-03-04 MED ORDER — ALBUMIN HUMAN 5 % IV SOLN
INTRAVENOUS | Status: DC | PRN
Start: 2023-03-04 — End: 2023-03-04

## 2023-03-04 MED ORDER — ALBUMIN HUMAN 5 % IV SOLN
INTRAVENOUS | Status: AC
  Filled 2023-03-04: qty 250

## 2023-03-04 MED ORDER — BUPIVACAINE LIPOSOME 1.3 % IJ SUSP
INTRAMUSCULAR | Status: DC | PRN
Start: 2023-03-04 — End: 2023-03-04
  Administered 2023-03-04: 20 mL

## 2023-03-04 MED ORDER — PHENYLEPHRINE HCL-NACL 20-0.9 MG/250ML-% IV SOLN
INTRAVENOUS | Status: DC | PRN
Start: 2023-03-04 — End: 2023-03-04
  Administered 2023-03-04: 20 ug/min via INTRAVENOUS

## 2023-03-04 MED ORDER — NOREPINEPHRINE BITARTRATE 1 MG/ML IV SOLN
INTRAVENOUS | Status: DC | PRN
  Administered 2023-03-04: 1 mL via INTRAVENOUS

## 2023-03-04 MED ORDER — ONDANSETRON HCL 4 MG/2ML IJ SOLN
INTRAMUSCULAR | Status: AC
  Filled 2023-03-04: qty 2

## 2023-03-04 MED ORDER — ORAL CARE MOUTH RINSE: 15.0000 mL | Freq: Once | OROMUCOSAL | Status: AC

## 2023-03-04 MED ORDER — 0.9 % SODIUM CHLORIDE (POUR BTL) OPTIME
TOPICAL | Status: DC | PRN
  Administered 2023-03-04: 500 mL

## 2023-03-04 MED ORDER — GLYCOPYRROLATE 0.2 MG/ML IJ SOLN
INTRAMUSCULAR | Status: DC | PRN
  Administered 2023-03-04: .2 mg via INTRAVENOUS

## 2023-03-04 MED ORDER — SUCCINYLCHOLINE CHLORIDE 200 MG/10ML IV SOSY
PREFILLED_SYRINGE | INTRAVENOUS | Status: DC | PRN
  Administered 2023-03-04: 100 mg via INTRAVENOUS

## 2023-03-04 MED ORDER — SUCCINYLCHOLINE CHLORIDE 200 MG/10ML IV SOSY
PREFILLED_SYRINGE | INTRAVENOUS | Status: AC
  Filled 2023-03-04: qty 10

## 2023-03-04 MED ORDER — TRANEXAMIC ACID-NACL 1000-0.7 MG/100ML-% IV SOLN
1000.0000 mg | INTRAVENOUS | Status: AC
  Administered 2023-03-04: 1000 mg via INTRAVENOUS

## 2023-03-04 MED ORDER — CHLORHEXIDINE GLUCONATE 0.12 % MT SOLN
15.0000 mL | Freq: Once | OROMUCOSAL | Status: AC
  Administered 2023-03-04: 15 mL via OROMUCOSAL

## 2023-03-04 MED ORDER — TRANEXAMIC ACID-NACL 1000-0.7 MG/100ML-% IV SOLN
INTRAVENOUS | Status: AC
  Filled 2023-03-04: qty 100

## 2023-03-04 MED ORDER — FENTANYL CITRATE (PF) 100 MCG/2ML IJ SOLN
INTRAMUSCULAR | Status: DC | PRN
  Administered 2023-03-04: 50 ug via INTRAVENOUS

## 2023-03-04 MED ORDER — LACTATED RINGERS IV SOLN: INTRAVENOUS | Status: DC | PRN

## 2023-03-04 MED ORDER — DEXMEDETOMIDINE HCL IN NACL 80 MCG/20ML IV SOLN
INTRAVENOUS | Status: DC | PRN
Start: 2023-03-04 — End: 2023-03-04
  Administered 2023-03-04 (×2): 4 ug via INTRAVENOUS

## 2023-03-04 MED ORDER — ACETAMINOPHEN 500 MG PO TABS
1000.0000 mg | ORAL_TABLET | Freq: Three times a day (TID) | ORAL | 2 refills | Status: DC
Start: 2023-03-04 — End: 2023-04-14

## 2023-03-04 MED ORDER — FENTANYL CITRATE (PF) 100 MCG/2ML IJ SOLN
INTRAMUSCULAR | Status: AC
  Filled 2023-03-04: qty 2

## 2023-03-04 MED ORDER — PROPOFOL 10 MG/ML IV BOLUS
INTRAVENOUS | Status: AC
  Filled 2023-03-04: qty 40

## 2023-03-04 MED ORDER — FAMOTIDINE 20 MG PO TABS
ORAL_TABLET | ORAL | Status: AC
  Filled 2023-03-04: qty 1

## 2023-03-04 MED ORDER — FAMOTIDINE 20 MG PO TABS
20.0000 mg | ORAL_TABLET | Freq: Once | ORAL | Status: AC
  Administered 2023-03-04: 20 mg via ORAL

## 2023-03-04 MED ORDER — KETAMINE HCL 50 MG/5ML IJ SOSY
PREFILLED_SYRINGE | INTRAMUSCULAR | Status: AC
  Filled 2023-03-04: qty 5

## 2023-03-04 MED ORDER — PHENYLEPHRINE 80 MCG/ML (10ML) SYRINGE FOR IV PUSH (FOR BLOOD PRESSURE SUPPORT)
PREFILLED_SYRINGE | INTRAVENOUS | Status: AC
  Filled 2023-03-04: qty 10

## 2023-03-04 MED ORDER — DEXAMETHASONE SODIUM PHOSPHATE 10 MG/ML IJ SOLN
INTRAMUSCULAR | Status: AC
  Filled 2023-03-04: qty 1

## 2023-03-04 MED ORDER — LIDOCAINE HCL (PF) 1 % IJ SOLN
INTRAMUSCULAR | Status: AC
  Filled 2023-03-04: qty 5

## 2023-03-04 MED ORDER — ALBUTEROL SULFATE HFA 108 (90 BASE) MCG/ACT IN AERS
INHALATION_SPRAY | RESPIRATORY_TRACT | Status: DC | PRN
Start: 2023-03-04 — End: 2023-03-04
  Administered 2023-03-04 (×2): 6 via RESPIRATORY_TRACT

## 2023-03-04 MED ORDER — DEXAMETHASONE SODIUM PHOSPHATE 10 MG/ML IJ SOLN
INTRAMUSCULAR | Status: DC | PRN
  Administered 2023-03-04 (×2): 10 mg via INTRAVENOUS

## 2023-03-04 MED ORDER — SODIUM CHLORIDE 0.9 % IV SOLN: INTRAVENOUS | Status: DC

## 2023-03-04 MED ORDER — NOREPINEPHRINE 4 MG/250ML-% IV SOLN
INTRAVENOUS | Status: AC
  Filled 2023-03-04: qty 250

## 2023-03-04 MED ORDER — MIDAZOLAM HCL 2 MG/2ML IJ SOLN
INTRAMUSCULAR | Status: DC | PRN
  Administered 2023-03-04 (×2): 1 mg via INTRAVENOUS

## 2023-03-04 MED ORDER — CEFAZOLIN SODIUM-DEXTROSE 2-4 GM/100ML-% IV SOLN
2.0000 g | INTRAVENOUS | Status: AC
  Administered 2023-03-04: 2 g via INTRAVENOUS

## 2023-03-04 MED ORDER — SODIUM CHLORIDE 0.9 % IR SOLN
Status: DC | PRN
  Administered 2023-03-04: 3000 mL

## 2023-03-04 MED ORDER — OXYCODONE HCL 5 MG/5ML PO SOLN: 5.0000 mg | Freq: Once | ORAL | Status: DC | PRN

## 2023-03-04 MED ORDER — VANCOMYCIN HCL 1000 MG IV SOLR
INTRAVENOUS | Status: AC
  Filled 2023-03-04: qty 20

## 2023-03-04 MED ORDER — NOREPINEPHRINE 4 MG/250ML-% IV SOLN
INTRAVENOUS | Status: DC | PRN
Start: 2023-03-04 — End: 2023-03-04
  Administered 2023-03-04: 4 ug/min via INTRAVENOUS

## 2023-03-04 MED ORDER — GLYCOPYRROLATE 0.2 MG/ML IJ SOLN
INTRAMUSCULAR | Status: AC
  Filled 2023-03-04: qty 1

## 2023-03-04 MED ORDER — VASOPRESSIN 20 UNIT/ML IV SOLN
INTRAVENOUS | Status: DC | PRN
Start: 2023-03-04 — End: 2023-03-04
  Administered 2023-03-04: 1 [IU] via INTRAVENOUS
  Administered 2023-03-04: 2 [IU] via INTRAVENOUS
  Administered 2023-03-04: 1 [IU] via INTRAVENOUS
  Administered 2023-03-04: 2 [IU] via INTRAVENOUS

## 2023-03-04 MED ORDER — EPHEDRINE SULFATE-NACL 50-0.9 MG/10ML-% IV SOSY
PREFILLED_SYRINGE | INTRAVENOUS | Status: DC | PRN
  Administered 2023-03-04: 5 mg via INTRAVENOUS
  Administered 2023-03-04 (×2): 10 mg via INTRAVENOUS
  Administered 2023-03-04: 5 mg via INTRAVENOUS

## 2023-03-04 MED ORDER — ONDANSETRON HCL 4 MG/2ML IJ SOLN
INTRAMUSCULAR | Status: DC | PRN
  Administered 2023-03-04: 4 mg via INTRAVENOUS

## 2023-03-04 MED ORDER — VASOPRESSIN 20 UNIT/ML IV SOLN
INTRAVENOUS | Status: AC
  Filled 2023-03-04: qty 1

## 2023-03-04 MED ORDER — FENTANYL CITRATE (PF) 100 MCG/2ML IJ SOLN: 25.0000 ug | INTRAMUSCULAR | Status: DC | PRN

## 2023-03-04 MED ORDER — ONDANSETRON 4 MG PO TBDP: 4.0000 mg | ORAL_TABLET | Freq: Three times a day (TID) | ORAL | 0 refills | Status: DC | PRN

## 2023-03-04 MED ORDER — KETAMINE HCL 50 MG/5ML IJ SOSY
PREFILLED_SYRINGE | INTRAMUSCULAR | Status: DC | PRN
Start: 2023-03-04 — End: 2023-03-04
  Administered 2023-03-04 (×2): 25 mg via INTRAVENOUS

## 2023-03-04 MED ORDER — NEOMYCIN-POLYMYXIN B GU 40-200000 IR SOLN
Status: AC
  Filled 2023-03-04: qty 1

## 2023-03-04 MED ORDER — OXYCODONE HCL 5 MG PO TABS
5.0000 mg | ORAL_TABLET | ORAL | 0 refills | Status: DC | PRN
Start: 2023-03-04 — End: 2023-05-09

## 2023-03-04 MED ORDER — LIDOCAINE HCL (PF) 1 % IJ SOLN
INTRAMUSCULAR | Status: DC | PRN
Start: 2023-03-04 — End: 2023-03-04
  Administered 2023-03-04: 3 mL

## 2023-03-04 MED ORDER — LIDOCAINE HCL (PF) 2 % IJ SOLN
INTRAMUSCULAR | Status: AC
  Filled 2023-03-04: qty 5

## 2023-03-04 MED ORDER — PROPOFOL 10 MG/ML IV BOLUS
INTRAVENOUS | Status: DC | PRN
  Administered 2023-03-04: 200 mg via INTRAVENOUS
  Administered 2023-03-04: 25 ug/kg/min via INTRAVENOUS
  Administered 2023-03-04: 50 mg via INTRAVENOUS

## 2023-03-04 MED ORDER — ASPIRIN 325 MG PO TBEC
325.0000 mg | DELAYED_RELEASE_TABLET | Freq: Every day | ORAL | 0 refills | Status: DC
Start: 2023-03-05 — End: 2023-04-14

## 2023-03-04 MED ORDER — CEFAZOLIN SODIUM-DEXTROSE 2-4 GM/100ML-% IV SOLN
2.0000 g | Freq: Once | INTRAVENOUS | Status: AC
  Administered 2023-03-04: 2 g via INTRAVENOUS

## 2023-03-04 MED ORDER — OXYCODONE HCL 5 MG PO TABS: 5.0000 mg | ORAL_TABLET | Freq: Once | ORAL | Status: DC | PRN

## 2023-03-04 SURGICAL SUPPLY — 87 items
ADH SKN CLS APL DERMABOND .7 (GAUZE/BANDAGES/DRESSINGS) ×1
ANCH SUT .5 CRC TPR CT 40X40 (SUTURE)
ANCH SUT 1.4 SUT TPE BLK/WHT (SUTURE)
APL PRP STRL LF DISP 70% ISPRP (MISCELLANEOUS) ×1
BASEPLATE P2 COATD GLND 6.5X30 (Shoulder) IMPLANT
BIT DRILL 4 DIA CALIBRATED (BIT) IMPLANT
BIT DRILL GUIDE PATIENT MATCH (MISCELLANEOUS) IMPLANT
BLADE SAGITTAL WIDE XTHICK NO (BLADE) ×1 IMPLANT
BSPLAT GLND 30 STRL LF SHLDR (Shoulder) ×1 IMPLANT
CHLORAPREP W/TINT 26 (MISCELLANEOUS) ×1 IMPLANT
CNTNR URN SCR LID CUP LEK RST (MISCELLANEOUS) IMPLANT
CONT SPEC 4OZ STRL OR WHT (MISCELLANEOUS) ×1
COOLER POLAR GLACIER W/PUMP (MISCELLANEOUS) ×1 IMPLANT
DERMABOND ADVANCED .7 DNX12 (GAUZE/BANDAGES/DRESSINGS) IMPLANT
DRAPE INCISE IOBAN 66X45 STRL (DRAPES) ×2 IMPLANT
DRAPE SHEET LG 3/4 BI-LAMINATE (DRAPES) ×2 IMPLANT
DRAPE TABLE BACK 80X90 (DRAPES) ×1 IMPLANT
DRAPE U-SHAPE 47X51 STRL (DRAPES) ×1 IMPLANT
DRILL GUIDE PATIENT MATCH (MISCELLANEOUS) ×1
DRSG OPSITE POSTOP 3X4 (GAUZE/BANDAGES/DRESSINGS) IMPLANT
DRSG OPSITE POSTOP 4X6 (GAUZE/BANDAGES/DRESSINGS) IMPLANT
DRSG OPSITE POSTOP 4X8 (GAUZE/BANDAGES/DRESSINGS) IMPLANT
DRSG TEGADERM 2-3/8X2-3/4 SM (GAUZE/BANDAGES/DRESSINGS) IMPLANT
ELECT REM PT RETURN 9FT ADLT (ELECTROSURGICAL) ×1
ELECTRODE REM PT RTRN 9FT ADLT (ELECTROSURGICAL) ×1 IMPLANT
EVACUATOR 1/8 PVC DRAIN (DRAIN) IMPLANT
GAUZE SPONGE 2X2 STRL 8-PLY (GAUZE/BANDAGES/DRESSINGS) IMPLANT
GAUZE XEROFORM 1X8 LF (GAUZE/BANDAGES/DRESSINGS) IMPLANT
GLOVE BIOGEL PI IND STRL 8 (GLOVE) ×2 IMPLANT
GLOVE PI ULTRA LF STRL 7.5 (GLOVE) ×2 IMPLANT
GLOVE SURG ORTHO 8.0 STRL STRW (GLOVE) ×2 IMPLANT
GLOVE SURG SYN 8.0 (GLOVE) ×1
GLOVE SURG SYN 8.0 PF PI (GLOVE) ×1 IMPLANT
GOWN STRL REUS W/ TWL LRG LVL3 (GOWN DISPOSABLE) ×2 IMPLANT
GOWN STRL REUS W/ TWL XL LVL3 (GOWN DISPOSABLE) ×1 IMPLANT
GOWN STRL REUS W/TWL LRG LVL3 (GOWN DISPOSABLE) ×2
GOWN STRL REUS W/TWL XL LVL3 (GOWN DISPOSABLE) ×1
HOOD PEEL AWAY T7 (MISCELLANEOUS) ×2 IMPLANT
HUMERA STEM SM SHELL SHOU 10 (Miscellaneous) ×1 IMPLANT
INSERT HUMERAL SM SOCKET 32 +4 (Joint) IMPLANT
MANIFOLD NEPTUNE II (INSTRUMENTS) ×1 IMPLANT
MASK FACE SPIDER DISP (MASK) ×1 IMPLANT
MAT ABSORB FLUID 56X50 GRAY (MISCELLANEOUS) ×1 IMPLANT
NDL REVERSE CUT 1/2 CRC (NEEDLE) IMPLANT
NDL SPNL 20GX3.5 QUINCKE YW (NEEDLE) IMPLANT
NEEDLE REVERSE CUT 1/2 CRC (NEEDLE) ×1
NEEDLE SPNL 20GX3.5 QUINCKE YW (NEEDLE) ×1
NS IRRIG 1000ML POUR BTL (IV SOLUTION) ×1 IMPLANT
P2 COATDE GLNOID BSEPLT 6.5X30 (Shoulder) ×1 IMPLANT
PACK ARTHROSCOPY SHOULDER (MISCELLANEOUS) ×1 IMPLANT
PAD WRAPON POLAR SHDR XLG (MISCELLANEOUS) ×1 IMPLANT
PULSAVAC PLUS IRRIG FAN TIP (DISPOSABLE) ×1
SCREW BONE LOCKING RSP 5.0X30 (Screw) ×1 IMPLANT
SCREW BONE RSP LOCK 5X18 (Screw) IMPLANT
SCREW BONE RSP LOCK 5X26 (Screw) IMPLANT
SCREW BONE RSP LOCK 5X30 (Screw) IMPLANT
SCREW BONE RSP LOCKING 18MM LG (Screw) ×1 IMPLANT
SCREW BONE RSP LOCKING 5.0X26 (Screw) ×1 IMPLANT
SCREW RETAIN W/HEAD 4MM OFFSET (Shoulder) IMPLANT
SLING ULTRA II LG (MISCELLANEOUS) IMPLANT
SLING ULTRA II M (MISCELLANEOUS) IMPLANT
SPONGE T-LAP 18X18 ~~LOC~~+RFID (SPONGE) ×1 IMPLANT
STAPLER SKIN PROX 35W (STAPLE) IMPLANT
STEM HUMERAL SM SHELL SHOU 10 (Miscellaneous) IMPLANT
STRAP SAFETY 5IN WIDE (MISCELLANEOUS) ×1 IMPLANT
SUT ETHIBOND 5-0 MS/4 CCS GRN (SUTURE) ×1
SUT FIBERWIRE #2 38 BLUE 1/2 (SUTURE)
SUT MNCRL 4-0 (SUTURE) ×2
SUT MNCRL 4-0 27XMFL (SUTURE) ×2
SUT MNCRL AB 4-0 PS2 18 (SUTURE) IMPLANT
SUT PROLENE 6 0 P 1 18 (SUTURE) IMPLANT
SUT TICRON 2-0 30IN 311381 (SUTURE) ×2 IMPLANT
SUT VIC AB 0 CT1 36 (SUTURE) ×1 IMPLANT
SUT VIC AB 2-0 CT1 (SUTURE) IMPLANT
SUT VIC AB 2-0 CT2 27 (SUTURE) ×2 IMPLANT
SUT XBRAID 1.4 BLK/WHT (SUTURE) IMPLANT
SUT XBRAID 1.4 BLUE (SUTURE) IMPLANT
SUT XBRAID 1.4 WHITE/BLUE (SUTURE) IMPLANT
SUT XBRAID 2 BLACK/BLUE (SUTURE) IMPLANT
SUTURE ETHBND 5-0 MS/4 CCS GRN (SUTURE) ×1 IMPLANT
SUTURE FIBERWR #2 38 BLUE 1/2 (SUTURE) ×1 IMPLANT
SUTURE MNCRL 4-0 27XMF (SUTURE) IMPLANT
SYR 30ML LL (SYRINGE) IMPLANT
TIP FAN IRRIG PULSAVAC PLUS (DISPOSABLE) ×1 IMPLANT
TRAP FLUID SMOKE EVACUATOR (MISCELLANEOUS) ×1 IMPLANT
WATER STERILE IRR 500ML POUR (IV SOLUTION) ×1 IMPLANT
WRAPON POLAR PAD SHDR XLG (MISCELLANEOUS) ×1

## 2023-03-04 NOTE — Evaluation (Signed)
Physical Therapy Evaluation Patient Details Name: Chantel Norr Spagnuolo MRN: [id number] DOB: 12/02/1970 Today's Date: 03/04/2023  History of Present Illness  Pt is 52 y/o s/p L RSA. PmHx includes: Asthma, COPD, and HTN.  Clinical Impression  Pt is a 52 y.o. female Post-Op Day0 for Left RSA. Pt received in recliner and agreed to PT session. Pt performed STS from recliner with supervision. Pt amb from room to stairs with the use of her personal SPC, CGA. Stair negotiation was performed without hand rails CGA. Pt returned back to room and ended session in recliner. Pt tolerated Tx well today and will continue to benefit from skilled PT sessions to improve shoulder ROM, strength, and functional mobility following d/c.        If plan is discharge home, recommend the following: A little help with bathing/dressing/bathroom;Assistance with cooking/housework   Can travel by private vehicle        Equipment Recommendations    Recommendations for Other Services       Functional Status Assessment Patient has had a recent decline in their functional status and demonstrates the ability to make significant improvements in function in a reasonable and predictable amount of time.     Precautions / Restrictions Precautions Precautions: Shoulder Type of Shoulder Precautions: RSA Shoulder Interventions: Don joy ultra sling;Shoulder abduction pillow;At all times;Off for dressing/bathing/exercises Precaution Booklet Issued: Yes (comment) Precaution Comments: Pt education was provided to discuss saftey and spacial awareness while arm is in sling. Required Braces or Orthoses: Sling Restrictions Weight Bearing Restrictions: Yes LUE Weight Bearing: Non weight bearing      Mobility  Bed Mobility               General bed mobility comments: seated in recliner chair    Transfers Overall transfer level: Needs assistance Equipment used: None Transfers: Sit to/from Stand Sit to Stand: Supervision            General transfer comment: Pt performed STS from recliner with supervision.    Ambulation/Gait Ambulation/Gait assistance: Contact guard assist Gait Distance (Feet): 100 Feet Assistive device: Straight cane Gait Pattern/deviations: Step-through pattern       General Gait Details: Pt amb from room to stairs with SPC on her Right side with CGA.  Stairs Stairs: Yes Stairs assistance: Contact guard assist Stair Management: No rails Number of Stairs: 4 General stair comments: Pt performed stair negotiation without rails CGA. Pt reports this is the same way that they perform stairs at home prior to RSA.  Wheelchair Mobility     Tilt Bed    Modified Rankin (Stroke Patients Only)       Balance Overall balance assessment: Modified Independent                                           Pertinent Vitals/Pain Pain Assessment Pain Assessment: No/denies pain    Home Living Family/patient expects to be discharged to:: Private residence Living Arrangements: Spouse/significant other Available Help at Discharge: Family;Available 24 hours/day Type of Home: Mobile home Home Access: Stairs to enter Entrance Stairs-Rails: None Entrance Stairs-Number of Steps: 3-4   Home Layout: One level Home Equipment: Cane - single point      Prior Function Prior Level of Function : Independent/Modified Independent             Mobility Comments: For PLOF, pt uses a SPC.  Extremity/Trunk Assessment   Upper Extremity Assessment Upper Extremity Assessment: LUE deficits/detail LUE Deficits / Details: NWB LUE: Unable to fully assess due to immobilization            Communication   Communication Communication: No apparent difficulties  Cognition Arousal: Alert Behavior During Therapy: WFL for tasks assessed/performed Overall Cognitive Status: Within Functional Limits for tasks assessed                                 General  Comments: AOx4. Pt pleasant and willing to participate in session.        General Comments      Exercises     Assessment/Plan    PT Assessment Patient needs continued PT services  PT Problem List Decreased strength;Decreased range of motion;Decreased mobility       PT Treatment Interventions DME instruction;Gait training;Stair training    PT Goals (Current goals can be found in the Care Plan section)  Acute Rehab PT Goals Patient Stated Goal: To go home PT Goal Formulation: With patient Time For Goal Achievement: 03/18/23 Potential to Achieve Goals: Good    Frequency BID     Co-evaluation               AM-PAC PT "6 Clicks" Mobility  Outcome Measure Help needed turning from your back to your side while in a flat bed without using bedrails?: A Little Help needed moving from lying on your back to sitting on the side of a flat bed without using bedrails?: A Little Help needed moving to and from a bed to a chair (including a wheelchair)?: None Help needed standing up from a chair using your arms (e.g., wheelchair or bedside chair)?: None Help needed to walk in hospital room?: None Help needed climbing 3-5 steps with a railing? : None 6 Click Score: 22    End of Session Equipment Utilized During Treatment: Gait belt Activity Tolerance: Patient tolerated treatment well Patient left: in chair;with family/visitor present;with call bell/phone within reach Nurse Communication: Mobility status PT Visit Diagnosis: Muscle weakness (generalized) (M62.81)    Time: 6213-0865 PT Time Calculation (min) (ACUTE ONLY): 14 min   Charges:                [employer] SPT, LAT, ATC   Virjean Boman Sauvignon-Howard 03/04/2023, 4:02 PM

## 2023-03-04 NOTE — Transfer of Care (Signed)
Immediate Anesthesia Transfer of Care Note  Patient: Shelly Wright  Procedure(s) Performed: Left reverse shoulder arthroplasty, biceps tenodesis; open distal clavicle excision and AC joint cyst excision (Left: Shoulder) Left reverse shoulder arthroplasty, biceps tenodesis; open distal clavicle excision and AC joint cyst excision (Left: Shoulder) Left reverse shoulder arthroplasty, biceps tenodesis; open distal clavicle excision and AC joint cyst excision (Left: Shoulder)  Patient Location: PACU  Anesthesia Type:General  Level of Consciousness: awake, alert , and oriented  Airway & Oxygen Therapy: Patient Spontanous Breathing and Patient connected to face mask oxygen  Post-op Assessment: Report given to RN and Post -op Vital signs reviewed and stable  Post vital signs: Reviewed and stable  Last Vitals:  Vitals Value Taken Time  BP 140/38 03/04/23 1137  Temp 36.2 C 03/04/23 1135  Pulse 84 03/04/23 1144  Resp 21 03/04/23 1144  SpO2 97 % 03/04/23 1144  Vitals shown include unfiled device data.  Last Pain:  Vitals:   03/04/23 0626  PainSc: 5          Complications: No notable events documented.

## 2023-03-04 NOTE — Anesthesia Postprocedure Evaluation (Signed)
Anesthesia Post Note  Patient: Shelly Wright  Procedure(s) Performed: Left reverse shoulder arthroplasty, biceps tenodesis; open distal clavicle excision and AC joint cyst excision (Left: Shoulder) Left reverse shoulder arthroplasty, biceps tenodesis; open distal clavicle excision and AC joint cyst excision (Left: Shoulder) Left reverse shoulder arthroplasty, biceps tenodesis; open distal clavicle excision and AC joint cyst excision (Left: Shoulder)  Patient location during evaluation: PACU Anesthesia Type: General and Regional Level of consciousness: awake and alert Pain management: pain level controlled Vital Signs Assessment: post-procedure vital signs reviewed and stable Respiratory status: spontaneous breathing, nonlabored ventilation, respiratory function stable and patient connected to nasal cannula oxygen Cardiovascular status: blood pressure returned to baseline and stable Postop Assessment: no apparent nausea or vomiting Anesthetic complications: no   No notable events documented.   Last Vitals:  Vitals:   03/04/23 1135 03/04/23 1145  BP: (!) 144/23 (!) 152/54  Pulse: 84 88  Resp: (!) 21 16  Temp: (!) 36.2 C   SpO2: 98% 98%    Last Pain:  Vitals:   03/04/23 1135  PainSc: 0-No pain                 Louie Boston

## 2023-03-04 NOTE — Evaluation (Signed)
Occupational Therapy Evaluation Patient Details Name: Shelly Wright MRN: [id number] DOB: Cornell 06, 1972 Today's Date: 03/04/2023   History of Present Illness Pt is 52 y/o s/p L RSA.   Clinical Impression   Upon entering the room, pt supine in bed and agreeable to OT evaluation. Pt's significant other present in room for hands on education. Pt verbalized precautions correctly. Pt reports being mod I at baseline with use of SPC for mobility and self care needs. Pt was observed ambulating from bathroom with staff with CGA for balance without use of AD. OT educates and demonstrated how to doff sling and polar care with significant other in room returning demonstrations after OT assists her with donning UB clothing item. Caregiver needing min verbal cues for correct technique but returns demonstrations. Handout also provided for pt and family to refer to at home as needed. OT educated pt on hand/wrist/elbow exercises and she was familiar with these from prior shoulder surgery. She feels confident about education and plans for discharge today with no further questions during this session. All education completed and OT to sign off at this time.       If plan is discharge home, recommend the following: A little help with bathing/dressing/bathroom;Assistance with cooking/housework;Assist for transportation;Help with stairs or ramp for entrance    Functional Status Assessment  Patient has had a recent decline in their functional status and demonstrates the ability to make significant improvements in function in a reasonable and predictable amount of time.  Equipment Recommendations  None recommended by OT       Precautions / Restrictions Precautions Precautions: Shoulder Shoulder Interventions: Roe Coombs joy ultra sling;Shoulder abduction pillow;At all times;Off for dressing/bathing/exercises Precaution Booklet Issued: Yes (comment) Restrictions Weight Bearing Restrictions: Yes LUE Weight Bearing: Non  weight bearing      Mobility Bed Mobility               General bed mobility comments: seated in recliner chair    Transfers Overall transfer level: Needs assistance   Transfers: Sit to/from Stand Sit to Stand: Supervision                      ADL either performed or assessed with clinical judgement   ADL Overall ADL's : Needs assistance/impaired                         Toilet Transfer: Supervision/safety;Contact guard Actor;Ambulation   Toileting- Clothing Manipulation and Hygiene: Contact guard assist         General ADL Comments: Pt needing mod A for UB dressing, polar care placement, and sling instructions     Vision Patient Visual Report: No change from baseline              Pertinent Vitals/Pain Pain Assessment Pain Assessment: No/denies pain     Extremity/Trunk Assessment Upper Extremity Assessment Upper Extremity Assessment: LUE deficits/detail LUE Deficits / Details: NWB LUE: Unable to fully assess due to immobilization           Communication Communication Communication: No apparent difficulties   Cognition Arousal: Alert Behavior During Therapy: WFL for tasks assessed/performed Overall Cognitive Status: Within Functional Limits for tasks assessed  Home Living Family/patient expects to be discharged to:: Private residence Living Arrangements: Spouse/significant other Available Help at Discharge: Family;Available 24 hours/day Type of Home: Mobile home Home Access: Stairs to enter Entrance Stairs-Number of Steps: 3-4 Entrance Stairs-Rails: None Home Layout: One level     Bathroom Shower/Tub: Producer, television/film/video: Standard Bathroom Accessibility: No   Home Equipment: Cane - single point          Prior Functioning/Environment Prior Level of Function : Independent/Modified Independent                                  OT Goals(Current goals can be found in the care plan section) Acute Rehab OT Goals Patient Stated Goal: to go home OT Goal Formulation: With patient/family Time For Goal Achievement: 03/04/23 Potential to Achieve Goals: Fair  OT Frequency:         AM-PAC OT "6 Clicks" Daily Activity     Outcome Measure Help from another person eating meals?: None Help from another person taking care of personal grooming?: None Help from another person toileting, which includes using toliet, bedpan, or urinal?: A Little Help from another person bathing (including washing, rinsing, drying)?: A Little Help from another person to put on and taking off regular upper body clothing?: A Lot Help from another person to put on and taking off regular lower body clothing?: A Little 6 Click Score: 19   End of Session Nurse Communication: Mobility status  Activity Tolerance: Patient tolerated treatment well Patient left: in chair;with family/visitor present;with bed alarm set                   Time: 7425-9563 OT Time Calculation (min): 15 min Charges:  OT General Charges $OT Visit: 1 Visit OT Evaluation $OT Eval Low Complexity: 1 Low  Jackquline Denmark, MS, OTR/L , CBIS ascom [phone]  03/04/23, 2:43 PM

## 2023-03-04 NOTE — H&P (Signed)
Paper H&P to be scanned into permanent record. H&P reviewed. No significant changes noted.  

## 2023-03-04 NOTE — Anesthesia Procedure Notes (Signed)
Anesthesia Regional Block: Interscalene brachial plexus block   Pre-Anesthetic Checklist: , timeout performed,  Correct Patient, Correct Site, Correct Laterality,  Correct Procedure, Correct Position, site marked,  Risks and benefits discussed,  Surgical consent,  Pre-op evaluation,  At surgeon's request and post-op pain management  Laterality: Left and Upper  Prep: alcohol swabs       Needles:  Injection technique: Single-shot  Needle Type: Echogenic Needle     Needle Length: 4cm  Needle Gauge: 25     Additional Needles:   Procedures:,,,, ultrasound used (permanent image in chart),,    Narrative:  Start time: 03/04/2023 7:33 AM End time: 03/04/2023 7:37 AM Injection made incrementally with aspirations every 5 mL.  Performed by: Personally  Anesthesiologist: Louie Boston, MD  Additional Notes: Patient's chart reviewed and they were deemed appropriate candidate for procedure, at surgeon's request. Patient educated about risks, benefits, and alternatives of the block including but not limited to: temporary or permanent nerve damage, bleeding, infection, damage to surround tissues, pneumothorax, hemidiaphragmatic paralysis, unilateral Horner's syndrome, block failure, local anesthetic toxicity. Patient expressed understanding. A formal time-out was conducted consistent with institution rules.  Monitors were applied, and minimal sedation used (see nursing record). The site was prepped with skin prep and allowed to dry, and sterile gloves were used. A high frequency linear ultrasound probe with probe cover was utilized throughout. C5-7 nerve roots located and appeared anatomically normal, local anesthetic injected around them, and echogenic block needle trajectory was monitored throughout. Aspiration performed every 5ml. Lung and blood vessels were avoided. All injections were performed without resistance and free of blood and paresthesias. The patient tolerated the procedure  well.  Injectate: 20ml exparel

## 2023-03-04 NOTE — Op Note (Signed)
SURGERY DATE: 03/04/2023   PRE-OP DIAGNOSIS:  1. Left shoulder rotator cuff arthropathy 2. Left shoulder ganglion cysts   POST-OP DIAGNOSIS:  1. Left shoulder rotator cuff arthropathy 2. Left shoulder ganglion cysts   PROCEDURES:  1. Left reverse total shoulder arthroplasty 2. Left shoulder ganglion cyst excision about AC joint and anterior chest wall 3. Left open distal clavicle excision   SURGEON: Rosealee Albee, MD  ASSISTANTS: Dedra Skeens, PA   ANESTHESIA: Gen + interscalene block   ESTIMATED BLOOD LOSS: 200cc   TOTAL IV FLUIDS: per anesthesia record  IMPLANTS: [employer]: RSP Glenoid Head w/Retaining screw 32-4; Monoblock Reverse Shoulder Baseplate with 6.23mm central screw; 3 locking screws into baseplate (superior, posterior, inferior); Small Shell Short Humeral Stem 10 x 48mm; +4 retentive Small Socket Insert    INDICATION(S):  Shelly Wright is a 52 y.o. female with chronic shoulder pain with inability to lift arm overhead. Imaging consistent with massive, irreparable rotator cuff tear. She has had prior rotator cuff repair by me on 06/18/21, but suffered a rotator cuff re-tear. Conservative measures including medications and cortisone injections have not provided adequate relief. Patient was also found to have painful ganglion cysts about the [facility] joint and anterior chest well. After discussion of risks, benefits, and alternatives to surgery, the patient elected to proceed with reverse shoulder arthroplasty and ganglion cyst excision.   OPERATIVE FINDINGS: massive rotator cuff tear (complete supraspinatus, infraspinatus, subscapularis); AC joint cyst measuring ~4 x 3 x 2 cm. Anterior chest wall cyst measuring 5 x 2 x 2 cm.    OPERATIVE REPORT:   I identified Shelly Wright in the pre-operative holding area. Informed consent was obtained and the surgical site was marked. I reviewed the risks and benefits of the proposed surgical intervention and the patient (and/or patient's  guardian) wished to proceed. An interscalene block with Exparel was administered by the Anesthesia team. The patient was transferred to the operative suite and general anesthesia was administered. The patient was placed in the beach chair position with the head of the bed elevated approximately 45 degrees. All down side pressure points were appropriately padded. Pre-op exam under anesthesia confirmed some stiffness and crepitus. Appropriate IV antibiotics were administered. The extremity was then prepped and draped in standard fashion. A time out was performed confirming the correct extremity, correct patient, and correct procedure.   Attention was first turned towards the cyst excisions approximately 6 cm incision over the [facility] joint was made.  Dissection was carried down through the subcutaneous tissue.  The walls of the cyst were identified.  There was approximately a ~4 x 3 x 2 cm cyst.  It was removed in its entirety.  The cyst walls were sent for pathology.  The fluid within the cyst itself was consistent with a ganglion cyst.  The capsule of the [facility] joint was already evaluated and so it was opened in its entirety with a longitudinal incision.  A rongeur was used to remove the end of the distal clavicle until there is approximately 1 cm about the [facility] joint.  Appropriate hemostasis was achieved.  The capsule was then oversewn with 0 Vicryl such that there was complete closure of the [facility] joint capsule.  2-0 Vicryl was then used to reapproximate the subdermal tissue.  4-0 Monocryl was used to close the skin.  Next, the cyst over the anterior chest wall was palpated.  It was not accessible from the planned deltopectoral incision so a separate 4 cm incision was  made over the cyst.  Dissection was carried down through the subcutaneous tissue, and the walls of the cyst were identified.  This was an approximately 5 x 2 x 2 cm cyst.  It was also removed in entirety and the cyst walls were sent for pathology.  The fluid  within the cyst itself was consistent with a ganglion cyst.  Appropriate hemostasis was achieved.  Subdermal tissue was then reapproximated using 2-0 Vicryl.  4-0 Monocryl was used to close the skin.  Attention was then turned towards performing the reverse shoulder arthroplasty.  We used the standard deltopectoral incision from the coracoid to ~12cm distal. We found the cephalic vein and took it laterally.  There was a small nick in the cephalic vein.  A 6-0 Prolene suture was used to form a repair of the cephalic vein.  There is no overt bleeding and the remainder of the vein appeared patent proximally after repair.  We opened the deltopectoral interval widely and placed retractors under the CA ligament in the subacromial space and under the deltoid tendon at its insertion. We then abducted and internally rotated the arm and released the underlying bursa between these retractors, taking care not to damage the circumflex branch of the axillary nerve.   Next, we brought the arm back in adduction at slight forward flexion with external rotation. We opened the clavipectoral fascia lateral to the conjoint tendon. We gently palpated the axillary nerve and verified its position and continuity on both sides of the humerus with a Tug test. This test was repeated multiple times during the procedure for nerve localization and confirmed to be intact at the end of the case. We then cauterized the anterior humeral circumflex ("Three sisters") vessels. The arm was then internally rotated, we cut the falciform ligament at approximately 1 cm of the upper portion of the pectoralis major insertion. Next we unroofed the bicipital groove.  Biceps tendon was not visualized.   At this point, we could see that the supraspinatus was completely torn with a bald humeral head superiorly. The subscapularis also was complete torn and retracted.  It was also scarred with the undersurface of the conjoined tendon.. We performed a  subscapularis peel using electrocautery to remove the anterior capsule and subscapularis off of the humeral head. We released the inferior capsule from the humerus all the way to the posterior band of the inferior glenohumeral ligament. When this was complete we gently dislocated the shoulder up into the wound. We removed any osteophytes and made our cut with the appropriate inclination in 20 degrees of retroversion  We then turned our attention back to the glenoid. The proximal humerus was retracted posteriorly. The anterior capsule was dissected free from the subscapularis. The anterior capsule was then excised, exposing the anterior glenoid. We then grasped the labrum and removed it circumferentially. During the glenoid exposure, the axillary nerve was protected the entire time.    A patient-specific guide was used to drill the central guidepin. A tap was placed, matching the trajectory of the guidepin. An appropriately sized reamer was used to ream the glenoid. The monoblock baseplate was inserted and excellent fixation was achieved such that the entire scapula rotated with further attempted seating of the baseplate. The 3 peripheral screws were drilled, measured, and placed. A 32-4 glenosphere was then placed and tightened.   We then turned our attention back to the humerus. We sized for a small shell prosthesis. We sequentially used larger diameter canal finders until we met significant  resistance and sequential broaching was performed to this size listed above. We trialed both a 0 and +4 poly. The humerus was trialed and noted to have satisfactory stability, motion, and deltoid tension with a +4 retentive poly. The trial implants were removed. The humeral canal was pulse lavaged.  Quality of the subscapularis was extremely poor so it was not reapproximated.  Next, the implant was placed with the appropriate retroversion. Stability was confirmed. We placed a +4 retentive poly. The humerus was reduced and  motion, tension, and stability were satisfactory. A Hemovac drain was placed.   We closed the deltopectoral interval deep to the cephalic vein with a running, 0-Vicryl suture. The skin was closed with 2-0 Vicryl and 4-0 Monocryl. Xeroform and Honeycomb dressing was applied. A PolarCare unit and sling were placed. Patient was extubated, transferred to a stretcher bed and to the post antesthesia care unit in stable condition.   Of note, assistance from a PA was essential to performing the surgery.  PA was present for the entire surgery.  PA assisted with patient positioning, retraction, instrumentation, and wound closure. The surgery would have been more difficult and had longer operative time without PA assistance.    POSTOPERATIVE PLAN: The patient will be admitted with plan for discharge home on POD#1. Operative arm to remain in sling at all times except RoM exercises and hygiene. Can perform pendulums, elbow/wrist/hand RoM exercises. Passive RoM allowed to 90 FF and 30 ER. ASA 325mg  x 6 weeks for DVT ppx. Plan for PT starting on POD #3-4. Patient to return to clinic in ~2 weeks for post-operative appointment.

## 2023-03-04 NOTE — Progress Notes (Signed)
Patient notified nurse that hemovac came out on its own while she was using the restroom. Hemovac tip intact. Dr. [name] notified and ok for her to d/c if antibiotic was administered and patient was seen by OT.

## 2023-03-04 NOTE — Anesthesia Preprocedure Evaluation (Signed)
Anesthesia Evaluation  Patient identified by MRN, date of birth, ID band Patient awake    Reviewed: Allergy & Precautions, NPO status , Patient's Chart, lab work & pertinent test results  History of Anesthesia Complications Negative for: history of anesthetic complications  Airway Mallampati: III  TM Distance: >3 FB Neck ROM: full    Dental  (+) Chipped   Pulmonary asthma , COPD,  COPD inhaler, Current Smoker   Pulmonary exam normal        Cardiovascular hypertension, On Medications negative cardio ROS Normal cardiovascular exam     Neuro/Psych  PSYCHIATRIC DISORDERS Anxiety Depression Bipolar Disorder    Neuromuscular disease    GI/Hepatic negative GI ROS, Neg liver ROS,,,  Endo/Other    Morbid obesity  Renal/GU      Musculoskeletal  (+) Arthritis ,  Fibromyalgia -, narcotic dependent  Abdominal   Peds  Hematology negative hematology ROS (+)   Anesthesia Other Findings Past Medical History: No date: Abnormal EKG 08/30/2014: Acute right-sided low back pain with right-sided sciatica No date: Anxiety No date: Arthritis No date: Asthma     Comment:  in past, no current inhalers No date: Bipolar disorder (HCC) 04/20/2014: Breathlessness on exertion No date: Cannabis use disorder 05/07/2012: Cervical spinal cord compression (HCC) No date: Chronic pain syndrome No date: COPD (chronic obstructive pulmonary disease) (HCC) No date: DDD (degenerative disc disease), lumbar No date: Depression No date: DOE (dyspnea on exertion) No date: Dysrhythmia     Comment:  irregular-h/o in the past No date: Extreme obesity No date: Fibromyalgia 04/25/2014: H/O syncope 06/23/2019: High risk medication use No date: Hyperlipidemia No date: Hypertension No date: Hypokalemia 05/07/2021: Hyponatremia 05/06/2016: Long term current use of anticoagulant therapy (Lovenox) No date: Lumbar canal stenosis No date: Marijuana  smoker 05/23/2014: Neuritis or radiculitis due to rupture of lumbar  intervertebral disc 05/01/2020: Noncompliance with treatment regimen 02/02/2019: Nose colonized with MRSA     Comment:  a.) presurgical PCR (+) 02/02/2019 prior to LEFT TKA;               b.) presurgical PCR (+) 03/06/2019 prior to RIGHT REVERSE              SHOULDER/BICEPS TENODESIS; c.) presurgical PCR (+)               01/17/2022 prior to LEFT SHOULDER ARTHROSCOPY/OPEN               ROTATOR CUFF REPAIR/SUBACROMIAL DECOMPRESSION/DISTAL               CLAVICLE EXCISION/OPEN BICEPS TENODESIS 10/05/2021: Partner relational problem 07/31/2015: Periprosthetic fracture around prosthetic knee 04/25/2014: Personal history of perinatal problems 11/30/2019: Pharmacologic therapy No date: Pre-diabetes No date: Restless leg syndrome 01/24/2012: Soft tissue lesion of shoulder region 09/18/2015: Status post revision of total replacement of right knee 02/01/2015: Status post right knee replacement 04/08/2014: Syncope and collapse     Comment:  last was 2023 05/07/2012: Thoracic and lumbosacral neuritis 05/07/2012: Thoracic neuritis No date: Tobacco use disorder No date: Vitamin D deficiency  Past Surgical History: No date: ABDOMINAL HYSTERECTOMY No date: Cervical Fusion X 2 01/2016: JOINT REPLACEMENT; Right     Comment:  Dr Gavin Potters 02/10/2019: KNEE ARTHROPLASTY; Left     Comment:  Procedure: COMPUTER ASSISTED TOTAL KNEE ARTHROPLASTY -               RNFA;  Surgeon: Donato Heinz, MD;  Location: [facility] ORS;  Service: Orthopedics;  Laterality: Left; No date: KNEE ARTHROSCOPY; Bilateral     Comment:  Right knee scope 1998, Left knee scope 03/12/2021: REVERSE SHOULDER ARTHROPLASTY; Right     Comment:  Procedure: Right reverse shoulder arthroplasty, biceps               tenodesis;  Surgeon: Signa Kell, MD;  Location: [facility]               ORS;  Service: Orthopedics;  Laterality: Right; 06/18/2021: SHOULDER  ARTHROSCOPY WITH SUBACROMIAL DECOMPRESSION AND  OPEN ROTATOR C; Left     Comment:  Procedure: Left shoulder arthroscopy, Open rotator cuff               repair, subacromial decompression, distal clavicle               excision and, open biceps tenodesis;  Surgeon: Signa Kell, MD;  Location: [facility] ORS;  Service: Orthopedics;                Laterality: Left; No date: TONSILLECTOMY 02/01/2015: TOTAL KNEE ARTHROPLASTY; Right     Comment:  Procedure: TOTAL KNEE ARTHROPLASTY;  Surgeon: Erin Sons, MD;  Location: [facility] ORS;  Service: Orthopedics;              Laterality: Right; No date: TUBAL LIGATION     Reproductive/Obstetrics negative OB ROS                             Anesthesia Physical Anesthesia Plan  ASA: 3  Anesthesia Plan: General ETT   Post-op Pain Management: Regional block*   Induction: Intravenous  PONV Risk Score and Plan: 3 and Ondansetron, Dexamethasone, Midazolam and Treatment Pressly vary due to age or medical condition  Airway Management Planned: Oral ETT  Additional Equipment:   Intra-op Plan:   Post-operative Plan: Extubation in OR  Informed Consent: I have reviewed the patients History and Physical, chart, labs and discussed the procedure including the risks, benefits and alternatives for the proposed anesthesia with the patient or authorized representative who has indicated his/her understanding and acceptance.     Dental Advisory Given  Plan Discussed with: Anesthesiologist, CRNA and Surgeon  Anesthesia Plan Comments: (Patient consented for risks of anesthesia including but not limited to:  - adverse reactions to medications - damage to eyes, teeth, lips or other oral mucosa - nerve damage due to positioning  - sore throat or hoarseness - Damage to heart, brain, nerves, lungs, other parts of body or loss of life  Patient voiced understanding and assent.)        Anesthesia Quick  Evaluation

## 2023-03-04 NOTE — Discharge Instructions (Addendum)
SHOULDER SLING IMMOBILIZER   VIDEO Slingshot 2 Shoulder Brace Application - YouTube ---[email or web address]://www.porter.info/  INSTRUCTIONS While supporting the injured arm, slide the forearm into the sling. Wrap the adjustable shoulder strap around the neck and shoulders and attach the strap end to the sling using  the "alligator strap tab."  Adjust the shoulder strap to the required length. Position the shoulder pad behind the neck. To secure the shoulder pad location (optional), pull the shoulder strap away from the shoulder pad, unfold the hook material on the top of the pad, then press the shoulder strap back onto the hook material to secure the pad in place. Attach the closure strap across the open top of the sling. Position the strap so that it holds the arm securely in the sling. Next, attach the thumb strap to the open end of the sling between the thumb and fingers. After sling has been fit, it Capley be easily removed and reapplied using the quick release buckle on shoulder strap. If a neutral pillow or 15 abduction pillow is included, place the pillow at the waistline. Attach the sling to the pillow, lining up hook material on the pillow with the loop on sling. Adjust the waist strap to fit.  If waist strap is too long, cut it to fit. Use the small piece of double sided hook material (located on top of the pillow) to secure the strap end. Place the double sided hook material on the inside of the cut strap end and secure it to the waist strap.     If no pillow is included, attach the waist strap to the sling and adjust to fit.    Washing Instructions: Straps and sling must be removed and cleaned regularly depending on your activity level and perspiration. Hand wash straps and sling in cold water with mild detergent, rinse, air dry   POLAR CARE INFORMATION  MassAdvertisement.it  How to use [facility] Polar Care [facility] Therapy System?  YouTube    [email or web address].co.[email or web address]  OPERATING INSTRUCTIONS  Start the product With dry hands, connect the transformer to the electrical connection located on the top of the cooler. Next, plug the transformer into an appropriate electrical outlet. The unit will automatically start running at this point.  To stop the pump, disconnect electrical power.  Unplug to stop the product when not in use. Unplugging the Polar Care unit turns it off. Always unplug immediately after use. Never leave it plugged in while unattended. Remove pad.    FIRST ADD WATER TO FILL LINE, THEN ICE---Replace ice when existing ice is almost melted  1 Discuss Treatment with your Licensed Health Care Practitioner and Use Only as Prescribed 2 Apply Insulation Barrier & Cold Therapy Pad 3 Check for Moisture 4 Inspect Skin Regularly  Tips and Trouble Shooting Usage Tips 1. Use cubed or chunked ice for optimal performance. 2. It is recommended to drain the Pad between uses. To drain the pad, hold the Pad upright with the hose pointed toward the ground. Depress the black plunger and allow water to drain out. 3. You Gilday disconnect the Pad from the unit without removing the pad from the affected area by depressing the silver tabs on the hose coupling and gently pulling the hoses apart. The Pad and unit will seal itself and will not leak. Note: Some dripping during release is normal. 4. DO NOT RUN PUMP WITHOUT WATER! The pump in this unit is designed to run with water. Running the unit without water will cause permanent damage  to the pump. 5. Unplug unit before removing lid.  TROUBLESHOOTING GUIDE Pump not running, Water not flowing to the pad, Pad is not getting cold 1. Make sure the transformer is plugged into the wall outlet. 2. Confirm that the ice and water are filled to the indicated levels. 3. Make sure there are no kinks in the pad. 4. Gently pull on the blue tube to make sure the tube/pad junction is  straight. 5. Remove the pad from the treatment site and ll it while the pad is lying at; then reapply. 6. Confirm that the pad couplings are securely attached to the unit. Listen for the double clicks (Figure 1) to confirm the pad couplings are securely attached.  Leaks    Note: Some condensation on the lines, controller, and pads is unavoidable, especially in warmer climates. 1. If using a [facility] Polar Care Cold Therapy unit with a detachable Cold Therapy Pad, and a leak exists (other than condensation on the lines) disconnect the pad couplings. Make sure the silver tabs on the couplings are depressed before reconnecting the pad to the pump hose; then confirm both sides of the coupling are properly clicked in. 2. If the coupling continues to leak or a leak is detected in the pad itself, stop using it and call [employer] Customer Care at [phone].  Cleaning After use, empty and dry the unit with a soft cloth. Warm water and mild detergent Rottenberg be used occasionally to clean the pump and tubes.  WARNING: The Polar Care Cube can be cold enough to cause serious injury, including full skin necrosis. Follow these Operating Instructions, and carefully read the Product Insert (see pouch on side of unit) and the Cold Therapy Pad Fitting Instructions (provided with each Cold Therapy Pad) prior to use.       Rosealee Albee, MD  [facility]  Phone: [phone]  Fax: [phone]   Discharge Instructions after Reverse Shoulder Replacement    1. Activity/Sling: You are to be non-weight bearing on operative extremity. A sling/shoulder immobilizer has been provided for you. Only remove the sling to perform elbow, wrist, and hand RoM exercises and hygiene/dressing. Active reaching and lifting are not permitted. You will be given further instructions on sling use at your first physical therapy visit and postoperative visit with Dr. [name].   2. Dressings: Dressing Sheffler be removed at 1st physical therapy  visit (~3-4 days after surgery). Afterwards, you Mcavoy either leave open to air (if no drainage) or cover with dry, sterile dressing. If you have steri-strips on your wound, please do not remove them. They will fall off on their own. You Perlstein shower 5 days after surgery. Please pat incision dry. Do not rub or place any shear forces across incision. If there is drainage or any opening of incision after 5 days, please notify our offices immediately.    3. Driving:  Plan on not driving for six weeks. Please note that you are advised NOT to drive while taking narcotic pain medications as you Kerlin be impaired and unsafe to drive.   4. Medications:  - You have been provided a prescription for narcotic pain medicine (usually oxycodone). After surgery, take 1-2 narcotic tablets every 4 hours if needed for severe pain. Please start this as soon as you begin to start having pain (if you received a nerve block, start taking as soon as this wears off).  - A prescription for anti-nausea medication will be provided in case the narcotic medicine causes nausea -  take 1 tablet every 6 hours only if nauseated.  - Take enteric coated aspirin 325 mg once daily for 6 weeks to prevent blood clots. Do not take aspirin if you have an aspirin sensitivity/allergy or asthma or are on an anticoagulant (blood thinner) already. If so, then your home anticoagulant will be resume and managed - do not take aspirin. -Take tylenol 1000mg  (2 Extra strength or 3 regular strength tablets) every 8 hours for pain. This will reduce the amount of narcotic medication needed. Obeso stop tylenol when you are having minimal pain. - Take a stool softener (Colace, Dulcolax or Senakot) if you are using narcotic pain medications to help with constipation that is associated with narcotic use.   If you are taking prescription medication for anxiety, depression, insomnia, muscle spasm, chronic pain, or for attention deficit disorder you are advised that you are  at a higher risk of adverse effects with use of narcotics post-op, including narcotic addiction/dependence, depressed breathing, death. If you use non-prescribed substances: alcohol, marijuana, cocaine, heroin, methamphetamines, etc., you are at a higher risk of adverse effects with use of narcotics post-op, including narcotic addiction/dependence, depressed breathing, death. You are advised that taking > 50 morphine milligram equivalents (MME) of narcotic pain medication per day results in twice the risk of overdose or death. For your prescription provided: oxycodone 5 mg - taking more than 6 tablets per day after the first few days of surgery.   5. Physical Therapy: 1-2 times per week for ~12 weeks. Therapy typically starts on post operative Day 3 or 4. You have been provided an order for physical therapy. The therapist will provide home exercises. Please contact our offices if this appointment has not been scheduled.    6. Work: [place] do light duty/desk job in approximately 2 weeks when off of narcotics, pain is well-controlled, and swelling has decreased if able to function with one arm in sling. Full work [place] take 6 weeks if light motions and function of both arms is required. Lifting jobs [place] require 12 weeks.   7. Post-Op Appointments: Your first post-op appointment will be with Dr. [name] in approximately 2 weeks time.    If you find that they have not been scheduled please call the Orthopaedic Appointment front desk at [phone].  AMBULATORY SURGERY  DISCHARGE INSTRUCTIONS   The drugs that you were given will stay in your system until tomorrow so for the next 24 hours you should not:  Drive an automobile Make any legal decisions Drink any alcoholic beverage   You Nipper resume regular meals tomorrow.  Today it is better to start with liquids and gradually work up to solid foods.  You Dorin eat anything you prefer, but it is better to start with liquids, then soup and crackers, and  gradually work up to solid foods.   Please notify your doctor immediately if you have any unusual bleeding, trouble breathing, redness and pain at the surgery site, drainage, fever, or pain not relieved by medication.    Additional Instructions: PLEASE LEAVE EXPAREL (TEAL) ARMBAND ON FOR 4 DAYS   Please contact your physician with any problems or Same Day Surgery at [phone], Monday through Friday 6 am to 4 pm, or [place] at [facility] number at [phone].                               Rosealee Albee, MD [facility] Phone: [phone] Fax: [phone]  REVERSE SHOULDER ARTHROPLASTY REHAB GUIDELINES   These guidelines should be tailored to individual patients based on their rehab goals, age, precautions, quality of repair, etc.  Progression should be based on patient progress and approval by the referring physician.  PHASE 1 - Day 1 through Week 2  GENERAL GUIDELINES AND PRECAUTIONS Sling wear 24/7 except during grooming and home exercises (3 to 5 times daily) Avoid shoulder extension such that the arm is posterior the frontal plane.  When patients recline, a pillow should be placed behind the upper arm and sling should be on.  They should be advised to always be able to see the elbow Avoid combined IR/ADD/EXT, such as hand behind back to prevent dislocation Avoid combined IR and ADD such as reaching across the chest to prevent dislocation No AROM No submersion in pool/water for 4 weeks No weight bearing through operative arm (as in transfers, walker use, etc.)  GOALS Maintain integrity of joint replacement; protect soft tissue healing Increase PROM for elevation to 120 and ER to 30 (will remain the goal for first 6 weeks) Optimize distal UE circulation and muscle activity (elbow, wrist and hand) Instruct in use of sling for proper fit, polar care device for ice application after HEP, signs/symptoms of  infection  EXERCISES Active elbow, wrist and hand Passive forward elevation in scapular plane to 90-120 max motion; ER in scapular plane to 30 Active scapular retraction with arms resting in neutral position  CRITERIA TO PROGRESS TO PHASE 2 Low pain (less than 3/10) with shoulder PROM Healing of incision without signs of infection Clearance by MD to advance after 2 week MD check up  PHASE 2 - 2 weeks - 6 weeks  GENERAL GUIDELINES AND PRECAUTIONS Sling [place] be removed while at home; worn in community without abduction pillow Rosinski use arm for light activities of daily living (such as feeding, brushing teeth, dressing.) with elbow near  the side of the body  and arm in front of the body- no active lifting of the arm See submerge in water (tub, pool, [place], etc.) after 4 weeks Continue to avoid WBing through the operative arm Continue to avoid combined IR/EXT/ADD (hand behind the back) and IR/ADD  (reaching across chest) for dislocation precautions  GOALS  Achieve passive elevation to 120 and ER to 30  Low (less than 3/10) to no pain  Ability to fire all heads of the deltoid  EXERCISES Kallal discontinue grip, and active elbow and wrist exercises since using the arm in ADL's  with sling removed around the home Continue passive elevation to 120 and ER to 30, both in scapular plane with arm supported on table top Add submaximal isometrics, pain free effort, for all functional heads of deltoid (anterior, posterior, middle)  Ensure that with posterior deltoid isometric the shoulder does not move into extension and the arm remains anterior the frontal plane At 4 weeks:  begin to place arm in balanced position of 90 deg elevation in supine; when patient able to hold this position with ease, Bartok begin reverse pendulums clockwise and counterclockwise  CRITERIA TO PROGRESS TO PHASE 3 Passive forward elevation in scapular plane to 120; passive ER in scapular plane to 30 Ability to fire isometrically  all heads of the deltoid muscle without pain Ability to place and hold the arm in balanced position (90 deg elevation in supine)  PHASE 3 - 6 weeks to 3 months  GENERAL GUIDELINES AND PRECAUTIONS Discontinue use of sling Avoid forcing end range motion in  any direction to prevent dislocation  Burridge advance use of the arm actively in ADL's without being restricted to arm by the side of the body, however, avoid heavy lifting and sports (forever!) Mostafa initiate functional IR behind the back gently NO UPPER BODY ERGOMETER   GOALS Optimize PROM for elevation and ER in scapular plane with realistic expectation that max  mobility for elevation is usually around 145-160 passively; ER 40 to 50 passively; functional IR to L1 Recover AROM to approach as close to PROM available as possible; [place] expect 135-150 deg active elevation; 30 deg active ER; active functional IR to L1 Establish dynamic stability of the shoulder with deltoid and periscapular muscle gradual strengthening  EXERCISES Forward elevation in scapular plane active progression: supine to incline, to vertical; short to long lever arm Balanced position long lever arm AROM Active ER/IR with arm at side Scapular retraction with light band resistance Functional IR with hand slide up back - very gentle and gradual NO UPPER BODY ERGOMETER     CRITERIA TO PROGRESS TO PHASE 4  AROM equals/approaches PROM with good mechanics for elevation   No pain  Higher level demand on shoulder than ADL functions   PHASE 4 12 months and beyond  GENERAL GUIDELINES AND PRECAUTIONS No heavy lifting and no overhead sports No heavy pushing activity Gradually increase strength of deltoid and scapular stabilizers; also the rotator cuff if present with weights not to exceed 5 lbs NO UPPER BODY ERGOMETER   GOALS  Optimize functional use of the operative UE to meet the desired demands  Gradual increase in deltoid, scapular muscle, and rotator cuff strength   Pain free functional activities   EXERCISES Add light hand weights for deltoid up to and not to exceed 3 lbs for anterior and posterior with long arm lift against gravity; elbow bent to 90 deg for abduction in scapular plane Theraband progression for extension to hip with scapular depression/retraction Theraband progression for serratus anterior punches in supine; avoid wall, incline or prone pressups for serratus anterior End range stretching gently without forceful overpressure in all planes (elevation in scapular plane, ER in scapular plane, functional IR) with stretching done for life as part of a daily routine NO UPPER BODY ERGOMETER     CRITERIA FOR DISCHARGE FROM SKILLED PHYSICAL THERAPY  Pain free AROM for shoulder elevation (expect around 135-150)  Functional strength for all ADL's, work tasks, and hobbies approved by Careers adviser  Independence with home maintenance program   NOTES: 1. With proper exercise, motion, strength, and function continue to improve even after one year. 2. The complication rate after surgery is 5 - 8%. Complications include infection, fracture, heterotopic bone formation, nerve injury, instability, rotator cuff tear, and tuberosity nonunion. Please look for clinical signs, unusual symptoms, or lack of progress with therapy and report those to Dr. [name]. Prefer more communication than less.  3. The therapy plan above only serves as a guide. Please be aware of specific individualized patient instructions as written on the prescription or through discussions with the surgeon. 4. Please call Dr. [name] if you have any specific questions or concerns [phone]

## 2023-03-04 NOTE — Anesthesia Procedure Notes (Signed)
Procedure Name: Intubation Date/Time: 03/04/2023 7:52 AM  Performed by: Elisabeth Pigeon, CRNAPre-anesthesia Checklist: Patient identified, Patient being monitored, Timeout performed, Emergency Drugs available and Suction available Patient Re-evaluated:Patient Re-evaluated prior to induction Oxygen Delivery Method: Circle system utilized Preoxygenation: Pre-oxygenation with 100% oxygen Induction Type: IV induction Ventilation: Mask ventilation without difficulty Laryngoscope Size: Mac, 3 and McGraph Grade View: Grade I Tube type: Oral Tube size: 7.0 mm Number of attempts: 1 Airway Equipment and Method: Stylet Placement Confirmation: ETT inserted through vocal cords under direct vision, positive ETCO2 and breath sounds checked- equal and bilateral Secured at: 22 cm Tube secured with: Tape Dental Injury: Teeth and Oropharynx as per pre-operative assessment

## 2023-03-05 ENCOUNTER — Encounter: Payer: Self-pay | Admitting: Orthopedic Surgery

## 2023-03-06 LAB — SURGICAL PATHOLOGY

## 2023-03-10 ENCOUNTER — Ambulatory Visit (INDEPENDENT_AMBULATORY_CARE_PROVIDER_SITE_OTHER): Payer: Medicaid Other | Admitting: Psychiatry

## 2023-03-10 ENCOUNTER — Encounter: Payer: Self-pay | Admitting: Psychiatry

## 2023-03-10 VITALS — BP 147/78 | HR 92 | Temp 98.2°F | Ht 67.0 in | Wt 252.6 lb

## 2023-03-10 DIAGNOSIS — F1220 Cannabis dependence, uncomplicated: Secondary | ICD-10-CM | POA: Diagnosis not present

## 2023-03-10 DIAGNOSIS — F17200 Nicotine dependence, unspecified, uncomplicated: Secondary | ICD-10-CM

## 2023-03-10 DIAGNOSIS — F410 Panic disorder [episodic paroxysmal anxiety] without agoraphobia: Secondary | ICD-10-CM

## 2023-03-10 DIAGNOSIS — F3178 Bipolar disorder, in full remission, most recent episode mixed: Secondary | ICD-10-CM

## 2023-03-10 DIAGNOSIS — F411 Generalized anxiety disorder: Secondary | ICD-10-CM

## 2023-03-10 DIAGNOSIS — G4701 Insomnia due to medical condition: Secondary | ICD-10-CM

## 2023-03-10 MED ORDER — BUSPIRONE HCL 30 MG PO TABS
30.0000 mg | ORAL_TABLET | Freq: Two times a day (BID) | ORAL | 1 refills | Status: DC
Start: 2023-03-10 — End: 2023-08-17

## 2023-03-10 MED ORDER — MELATONIN 5 MG PO CAPS: 5.0000 mg | ORAL_CAPSULE | Freq: Every day | ORAL | Status: AC

## 2023-03-10 MED ORDER — TRAZODONE HCL 100 MG PO TABS
ORAL_TABLET | ORAL | 0 refills | Status: DC
Start: 2023-03-10 — End: 2023-05-09

## 2023-03-10 NOTE — Progress Notes (Unsigned)
BH MD OP Progress Note  03/11/2023 1:30 PM Shelly Wright  MRN:  [id number]  Chief Complaint:  Chief Complaint  Patient presents with   Follow-up   Depression   Anxiety   Medication Refill   HPI: Shelly Wright is a 52 year old Caucasian female on SSI, has a history of bipolar disorder, GAD, panic disorder, cannabis use disorder, tobacco use disorder, married, lives in [place] was evaluated in office today.  Patient today reports she is currently improving with regards to her mood symptoms.  She reports she is trying to work with an attorney regarding her inherited property.  If she is able to get it done and possibly sell it or write it off to a trust, she will be able to get some kind of income on a monthly basis.  Patient reports overall anxiety symptoms have improved.  Denies any mood swings, mania or hypomanic symptoms.  Patient currently struggles with sleep.  She had her shoulder surgery, left sided.  She reports she sleeps on a recliner.  She is also on opioid pain medications.  She reports the pain and discomfort affects her sleep.  She does have trazodone available which helps to some extent.  Agreeable to trial of melatonin along with the trazodone.  Patient currently compliant on all her medications including BuSpar, venlafaxine, denies side effects.  Patient denies any suicidality, homicidality or perceptual disturbances.  Patient denies any other concerns today.  Visit Diagnosis:    ICD-10-CM   1. Bipolar disorder, in full remission, most recent episode mixed (HCC)  F31.78 traZODone (DESYREL) 100 MG tablet    2. GAD (generalized anxiety disorder)  F41.1 traZODone (DESYREL) 100 MG tablet    busPIRone (BUSPAR) 30 MG tablet    3. Panic disorder  F41.0 busPIRone (BUSPAR) 30 MG tablet    4. Cannabis use disorder, moderate, dependence (HCC)  F12.20     5. Tobacco use disorder  F17.200     6. Insomnia due to medical condition  G47.01 Melatonin 5 MG CAPS      Past  Psychiatric History: I have past psychiatric history from progress note on 09/16/2018.  Past trials of venlafaxine, trazodone, Seroquel, hydroxyzine, Zoloft, Lexapro, duloxetine, Paxil, Abilify, Wellbutrin.  Past Medical History:  Past Medical History:  Diagnosis Date   Abnormal EKG    Acute right-sided low back pain with right-sided sciatica 08/30/2014   Anxiety    Arthritis    Asthma    in past, no current inhalers   Bipolar disorder (HCC)    Breathlessness on exertion 04/20/2014   Cannabis use disorder    Cervical spinal cord compression (HCC) 05/07/2012   Chronic pain syndrome    COPD (chronic obstructive pulmonary disease) (HCC)    DDD (degenerative disc disease), lumbar    Depression    DOE (dyspnea on exertion)    Dysrhythmia    irregular-h/o in the past   Extreme obesity    Fibromyalgia    H/O syncope 04/25/2014   High risk medication use 06/23/2019   Hyperlipidemia    Hypertension    Hypokalemia    Hyponatremia 05/07/2021   Long term current use of anticoagulant therapy (Lovenox) 05/06/2016   Lumbar canal stenosis    Marijuana smoker    Neuritis or radiculitis due to rupture of lumbar intervertebral disc 05/23/2014   Noncompliance with treatment regimen 05/01/2020   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   a.) presurgical PCR (+) 02/02/2019 prior to LEFT TKA; b.) presurgical PCR (+) 03/06/2019 prior  to RIGHT REVERSE SHOULDER/BICEPS TENODESIS; c.) presurgical PCR (+) 01/17/2022 prior to LEFT SHOULDER ARTHROSCOPY/OPEN ROTATOR CUFF REPAIR/SUBACROMIAL DECOMPRESSION/DISTAL CLAVICLE EXCISION/OPEN BICEPS TENODESIS   Partner relational problem 10/05/2021   Periprosthetic fracture around prosthetic knee 07/31/2015   Personal history of perinatal problems 04/25/2014   Pharmacologic therapy 11/30/2019   Pre-diabetes    Restless leg syndrome    Soft tissue lesion of shoulder region 01/24/2012   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/01/2015   Syncope and collapse 04/08/2014   last was 2023   Thoracic and lumbosacral neuritis 05/07/2012   Thoracic neuritis 05/07/2012   Tobacco use disorder    Vitamin D deficiency     Past Surgical History:  Procedure Laterality Date   ABDOMINAL HYSTERECTOMY     BICEPT TENODESIS Left 03/04/2023   Procedure: Left reverse shoulder arthroplasty, biceps tenodesis; open distal clavicle excision and AC joint cyst excision;  Surgeon: Signa Kell, MD;  Location: [facility];  Service: Orthopedics;  Laterality: Left;   Cervical Fusion X 2     JOINT REPLACEMENT Right 01/2016   Dr Gavin Potters   KNEE ARTHROPLASTY Left 02/10/2019   Procedure: COMPUTER ASSISTED TOTAL KNEE ARTHROPLASTY - RNFA;  Surgeon: Donato Heinz, MD;  Location: [facility];  Service: Orthopedics;  Laterality: Left;   KNEE ARTHROSCOPY Bilateral    Right knee scope 1998, Left knee scope   RESECTION DISTAL CLAVICAL Left 03/04/2023   Procedure: Left reverse shoulder arthroplasty, biceps tenodesis; open distal clavicle excision and AC joint cyst excision;  Surgeon: Signa Kell, MD;  Location: [facility];  Service: Orthopedics;  Laterality: Left;   REVERSE SHOULDER ARTHROPLASTY Right 03/12/2021   Procedure: Right reverse shoulder arthroplasty, biceps tenodesis;  Surgeon: Signa Kell, MD;  Location: [facility];  Service: Orthopedics;  Laterality: Right;   REVERSE SHOULDER ARTHROPLASTY Left 03/04/2023   Procedure: Left reverse shoulder arthroplasty, biceps tenodesis; open distal clavicle excision and AC joint cyst excision;  Surgeon: Signa Kell, MD;  Location: [facility];  Service: Orthopedics;  Laterality: Left;   SHOULDER ARTHROSCOPY WITH SUBACROMIAL DECOMPRESSION AND OPEN ROTATOR C Left 06/18/2021   Procedure: Left shoulder arthroscopy, Open rotator cuff repair, subacromial decompression, distal clavicle excision and, open biceps tenodesis;  Surgeon: Signa Kell, MD;  Location: [facility];  Service: Orthopedics;   Laterality: Left;   TONSILLECTOMY     TOTAL KNEE ARTHROPLASTY Right 02/01/2015   Procedure: TOTAL KNEE ARTHROPLASTY;  Surgeon: Erin Sons, MD;  Location: [facility];  Service: Orthopedics;  Laterality: Right;   TUBAL LIGATION      Family Psychiatric History: I have reviewed family psychiatric history from progress note on 09/16/2018.  Family History:  Family History  Problem Relation Age of Onset   Breast cancer Mother 58   Colon cancer Father    Heart attack Father    Bipolar disorder Sister    Depression Sister    Schizophrenia Sister     Social History: I have reviewed social history from progress note on 09/16/2018. Social History   Socioeconomic History   Marital status: Married    Spouse name: Shelly Maduro   Number of children: 1   Years of education: Not on file   Highest education level: High school graduate  Occupational History   Not on file  Tobacco Use   Smoking status: Every Day    Current packs/day: 0.00    Average packs/day: 1 pack/day for 30.0 years (30.0 ttl pk-yrs)    Types:  Cigarettes    Start date: 09/12/1991    Last attempt to quit: 09/11/2021    Years since quitting: 1.4   Smokeless tobacco: Never  Vaping Use   Vaping status: Never Used  Substance and Sexual Activity   Alcohol use: No   Drug use: Yes    Types: Marijuana    Comment: every day   Sexual activity: Not Currently  Other Topics Concern   Not on file  Social History Narrative   Not on file   Social Determinants of Health   Financial Resource Strain: Low Risk  (09/16/2018)   Overall Financial Resource Strain (CARDIA)    Difficulty of Paying Living Expenses: Not hard at all  Food Insecurity: No Food Insecurity (09/16/2018)   Hunger Vital Sign    Worried About Running Out of Food in the Last Year: Never true    Ran Out of Food in the Last Year: Never true  Transportation Needs: No Transportation Needs (09/16/2018)   PRAPARE - Administrator, Civil Service (Medical): No     Lack of Transportation (Non-Medical): No  Physical Activity: Inactive (09/16/2018)   Exercise Vital Sign    Days of Exercise per Week: 0 days    Minutes of Exercise per Session: 0 min  Stress: Stress Concern Present (09/16/2018)   [employer] of Occupational Health - Occupational Stress Questionnaire    Feeling of Stress : To some extent  Social Connections: Unknown (09/16/2018)   Social Connection and Isolation Panel [NHANES]    Frequency of Communication with Friends and Family: Not on file    Frequency of Social Gatherings with Friends and Family: Not on file    Attends Religious Services: Never    Active Member of Clubs or Organizations: No    Attends Engineer, structural: Never    Marital Status: Separated    Allergies: No Known Allergies  Metabolic Disorder Labs: Lab Results  Component Value Date   HGBA1C 6.1 (H) 02/20/2023   MPG 128.37 02/19/2023   MPG 131.24 09/26/2020   Lab Results  Component Value Date   PROLACTIN 6.4 09/26/2020   Lab Results  Component Value Date   CHOL 170 02/20/2023   TRIG 149 02/20/2023   HDL 41 02/20/2023   CHOLHDL 4.1 02/20/2023   VLDL 20 09/26/2020   LDLCALC 103 (H) 02/20/2023   LDLCALC 94 09/12/2022   Lab Results  Component Value Date   TSH 0.531 02/20/2023   TSH 0.412 (L) 09/12/2022    Therapeutic Level Labs: No results found for: "LITHIUM" No results found for: "VALPROATE" No results found for: "CBMZ"  Current Medications: Current Outpatient Medications  Medication Sig Dispense Refill   acetaminophen (TYLENOL) 500 MG tablet Take 1,000 mg by mouth 3 (three) times daily.     acetaminophen (TYLENOL) 500 MG tablet Take 2 tablets (1,000 mg total) by mouth every 8 (eight) hours. 90 tablet 2   albuterol (VENTOLIN HFA) 108 (90 Base) MCG/ACT inhaler Inhale 2 puffs into the lungs every 6 (six) hours as needed for wheezing or shortness of breath. 18 g 5   amLODipine-benazepril (LOTREL) 5-20 MG capsule TAKE 1 CAPSULE BY  MOUTH EVERY DAY (Patient taking differently: Take 1 capsule by mouth every morning.) 90 capsule 1   aspirin EC 325 MG tablet Take 1 tablet (325 mg total) by mouth daily. 42 tablet 0   cyclobenzaprine (FLEXERIL) 10 MG tablet TAKE ONE TABLET BY MOUTH 3 TIMES DAILY AS NEEDED FOR MUSCLE SPASM (Patient taking differently:  Take 10 mg by mouth 2 (two) times daily.) 90 tablet 1   gabapentin (NEURONTIN) 800 MG tablet Take 1 tablet (800 mg total) by mouth 3 (three) times daily. 90 tablet 5   lidocaine (XYLOCAINE) 5 % ointment Apply 1 Application topically as needed for moderate pain or mild pain. (Patient taking differently: Apply 1 Application topically daily at 6 (six) AM. Knees, back, shoulders) 35.44 g 5   loratadine (CLARITIN) 10 MG tablet Take 10 mg by mouth daily as needed for allergies.     Melatonin 5 MG CAPS Take 1 capsule (5 mg total) by mouth at bedtime.     meloxicam (MOBIC) 15 MG tablet Take 1 tablet (15 mg total) by mouth daily as needed. (Patient taking differently: Take 15 mg by mouth daily.) 90 tablet 1   oxyCODONE (ROXICODONE) 5 MG immediate release tablet Take 1-2 tablets (5-10 mg total) by mouth every 4 (four) hours as needed (pain). 30 tablet 0   Semaglutide, 1 MG/DOSE, 4 MG/3ML SOPN Inject 1 mg into the skin once a week. (Patient taking differently: Inject 1 mg into the skin once a week. Thursdays) 3 mL 3   simvastatin (ZOCOR) 20 MG tablet Take 1 tablet (20 mg total) by mouth at bedtime. 90 tablet 1   venlafaxine XR (EFFEXOR-XR) 150 MG 24 hr capsule TAKE 1 CAPSULE BY MOUTH EVERY DAY WITH BREAKFAST ALONG WITH 75 MG (Patient taking differently: 150 mg daily with breakfast. Takes with 75 mg Effexor=225 mg) 90 capsule 1   venlafaxine XR (EFFEXOR-XR) 75 MG 24 hr capsule TAKE 1 CAPSULE BY MOUTH EVERY DAY ALONG WITH 150 MG (Patient taking differently: 75 mg daily with breakfast. Takes with 150 mg Effexor=225 mg) 90 capsule 1   Vibegron (GEMTESA) 75 MG TABS Take 1 tablet (75 mg total) by mouth  daily. 30 tablet 5   busPIRone (BUSPAR) 30 MG tablet Take 1 tablet (30 mg total) by mouth 2 (two) times daily. 180 tablet 1   ondansetron (ZOFRAN-ODT) 4 MG disintegrating tablet Take 1 tablet (4 mg total) by mouth every 8 (eight) hours as needed for nausea or vomiting. (Patient not taking: Reported on 03/10/2023) 20 tablet 0   oxyCODONE-acetaminophen (PERCOCET/ROXICET) 5-325 MG tablet Take 2 tablets by mouth every 4 (four) hours as needed for severe pain (pain score 7-10). (Patient not taking: Reported on 03/10/2023)     traZODone (DESYREL) 100 MG tablet TAKE 2 TABLETS BY MOUTH AT BEDTIME AS NEEDED FOR SLEEP 180 tablet 0   No current facility-administered medications for this visit.     Musculoskeletal: Strength & Muscle Tone:  S/P surgery shoulder in a sling  Gait & Station:  uses a cane Patient leans: N/A  Psychiatric Specialty Exam: Review of Systems  Psychiatric/Behavioral:  Positive for sleep disturbance.     Blood pressure (!) 147/78, pulse 92, temperature 98.2 F (36.8 C), temperature source Skin, height 5\' 7"  (1.702 m), weight 252 lb 9.6 oz (114.6 kg).Body mass index is 39.56 kg/m.  General Appearance: Casual  Eye Contact:  Fair  Speech:  Clear and Coherent  Volume:  Normal  Mood:  Euthymic  Affect:  Congruent  Thought Process:  Goal Directed and Descriptions of Associations: Intact  Orientation:  Full (Time, Place, and Person)  Thought Content: Logical   Suicidal Thoughts:  No  Homicidal Thoughts:  No  Memory:  Immediate;   Fair Recent;   Fair Remote;   Fair  Judgement:  Fair  Insight:  Fair  Psychomotor Activity:  Normal  Concentration:  Concentration: Fair and Attention Span: Fair  Recall:  [employer] of Knowledge: Fair  Language: Fair  Akathisia:  No  Handed:  Right  AIMS (if indicated): not done  Assets:  Communication Skills Desire for Improvement Housing Intimacy Social Support  ADL's:  Intact  Cognition: WNL  Sleep:  Poor   Screenings: Probation officer Office Visit from 01/07/2022 in [facility] Office Visit from 10/05/2021 in [facility] Office Visit from 11/27/2020 in [facility] Office Visit from 09/26/2020 in [facility]  AIMS Total Score 0 0 0 0      GAD-7    Flowsheet Row Office Visit from 03/10/2023 in [facility] Office Visit from 09/10/2022 in [facility] Office Visit from 01/07/2022 in [facility] Office Visit from 08/14/2021 in [facility] Office Visit from 11/27/2020 in [facility]  Total GAD-7 Score 10 2 4  0 4      PHQ2-9    Flowsheet Row Office Visit from 03/10/2023 in [facility] Office Visit from 09/10/2022 in [facility] Office Visit from 01/07/2022 in [facility] Office Visit from 10/05/2021 in [facility] Office Visit from 05/07/2021 in [facility]  PHQ-2 Total Score 3 0 1 2 0  PHQ-9 Total Score 7 2 -- 4 --      Flowsheet Row Office Visit from 03/10/2023 in [facility] Admission (Discharged) from 03/04/2023 in [facility] Video Visit from 12/13/2022 in [facility]  C-SSRS RISK CATEGORY No Risk No Risk No Risk        Assessment and Plan: Aniaya Dural Connon is a 52 year old Caucasian female on SSI, history of bipolar disorder, tobacco use disorder was evaluated in office today.  Patient is currently struggling with sleep likely  multifactorial including pain, status post shoulder surgery, will benefit from the following plan.  Plan Bipolar disorder most recent episode mixed in remission Gabapentin 800 mg p.o. 4 times daily prescribed for pain Continue venlafaxine as prescribed  GAD/panic disorder-stable Venlafaxine extended release 225 mg p.o. daily BuSpar 30 mg p.o. twice daily  Cannabis use disorder moderate-improving Patient to continue to cut back  Tobacco use disorder-unstable Patient not ready to quit  Insomnia-unstable Likely multifactorial including pain.  She will need sufficient management of pain. Continue trazodone 200 mg at bedtime as needed Start melatonin 5 mg p.o. nightly. Discussed sleep hygiene techniques.     Consent: Patient/Guardian gives verbal consent for treatment and assignment of benefits for services provided during this visit. Patient/Guardian expressed understanding and agreed to proceed.  Follow-up in clinic in 8 weeks or sooner if needed.  This note was generated in part or whole with voice recognition software. Voice recognition is usually quite accurate but there are transcription errors that can and very often do occur. I apologize for any typographical errors that were not detected and corrected.     Jomarie Longs, MD 03/11/2023, 1:30 PM

## 2023-03-13 ENCOUNTER — Telehealth: Payer: Self-pay

## 2023-03-27 ENCOUNTER — Other Ambulatory Visit: Payer: Self-pay | Admitting: Family

## 2023-04-02 ENCOUNTER — Ambulatory Visit: Payer: Medicaid Other | Admitting: Dermatology

## 2023-04-02 DIAGNOSIS — S40861A Insect bite (nonvenomous) of right upper arm, initial encounter: Secondary | ICD-10-CM | POA: Diagnosis not present

## 2023-04-02 DIAGNOSIS — S40862A Insect bite (nonvenomous) of left upper arm, initial encounter: Secondary | ICD-10-CM

## 2023-04-02 DIAGNOSIS — T148XXA Other injury of unspecified body region, initial encounter: Secondary | ICD-10-CM

## 2023-04-02 DIAGNOSIS — S50862A Insect bite (nonvenomous) of left forearm, initial encounter: Secondary | ICD-10-CM

## 2023-04-02 DIAGNOSIS — A630 Anogenital (venereal) warts: Secondary | ICD-10-CM | POA: Diagnosis not present

## 2023-04-02 DIAGNOSIS — S50861A Insect bite (nonvenomous) of right forearm, initial encounter: Secondary | ICD-10-CM

## 2023-04-02 DIAGNOSIS — W57XXXA Bitten or stung by nonvenomous insect and other nonvenomous arthropods, initial encounter: Secondary | ICD-10-CM

## 2023-04-02 MED ORDER — TRIAMCINOLONE ACETONIDE 0.1 % EX CREA
1.0000 | TOPICAL_CREAM | Freq: Two times a day (BID) | CUTANEOUS | 11 refills | Status: DC | PRN
Start: 2023-04-02 — End: 2023-08-27

## 2023-04-02 MED ORDER — EUCRISA 2 % EX OINT: TOPICAL_OINTMENT | CUTANEOUS | 11 refills | Status: AC

## 2023-04-02 MED ORDER — HPV 9-VALENT RECOMB VACCINE IM SUSY: 0.5000 mL | PREFILLED_SYRINGE | Freq: Once | INTRAMUSCULAR | 2 refills | Status: AC

## 2023-04-02 NOTE — Patient Instructions (Signed)

## 2023-04-02 NOTE — Progress Notes (Signed)
   New Patient Visit   Subjective  Shelly Wright is a 52 y.o. female who presents for the following:  Patient here has moles, incontinence, pads rub on and make moles hurt. Some spots on face she is concerned about.  Patient also reports she has bed bugs and has had them for some time. Would like to know if we have any treatment.   Patient thinks has some skin cancer history if dads side.   The patient has spots, moles and lesions to be evaluated, some Mancusi be new or changing and the patient Ambrosius have concern these could be cancer.   The following portions of the chart were reviewed this encounter and updated as appropriate: medications, allergies, medical history  Review of Systems:  No other skin or systemic complaints except as noted in HPI or Assessment and Plan.  Objective  Well appearing patient in no apparent distress; mood and affect are within normal limits.   A focused examination was performed of the following areas: Face, genital area, arms  Relevant exam findings are noted in the Assessment and Plan.  genital warts Verrucous papules on inguinal creases    Assessment & Plan    EXCORIATIONs Exam: Excoriations on perioral skin  Treatment Plan: Recommend vaseline. Call if not resolving. Given sample of Eucrisa ointment - apply topically to affected areas of face twice daily.   Rx sent to [facility]    Bite reaction at body Exam: numerous scaly pink excoriated papules on upper arms and forearms  Treatment Plan: Patient is concerned she has bed bug infestion Must control infestation or new lesions will form Start triamcinolone 1% cream  apply twice daily to bug bites at body until skin feels smooth.   Avoid applying to face, groin, and axilla. Use as directed. Long-term use can cause thinning of the skin.  Topical steroids (such as triamcinolone, fluocinolone, fluocinonide, mometasone, clobetasol, halobetasol, betamethasone, hydrocortisone) can  cause thinning and lightening of the skin if they are used for too long in the same area. Your physician has selected the right strength medicine for your problem and area affected on the body. Please use your medication only as directed by your physician to prevent side effects.    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  Multiple treatments and methods Muckey be required to clear warts and it is possible treatment Schnake not be successful.  Treatment risks include discoloration; scarring and there is still potential for wart recurrence.  Discussed sending HPV 3 series vaccine  into local pharmacy. Rx sent to [facility] in [place]. Contact us if pharmacist will not administer due to age     hpv 9-valent vaccine (GARDASIL 9) prefilled syringe - genital warts Inject 0.5 mLs into the muscle once for 1 dose.  Bug bite, initial encounter  Related Medications triamcinolone cream (KENALOG) 0.1 % Apply 1 Application topically 2 (two) times daily as needed (Rash). To bug bites until skin feels smooth.  Excoriation    Return for 9 - 12 month condyloma follow up .  I, Asher Muir, CMA, am acting as scribe for Elie Goody, MD.   Documentation: I have reviewed the above documentation for accuracy and completeness, and I agree with the above.  Elie Goody, MD

## 2023-04-03 ENCOUNTER — Other Ambulatory Visit: Payer: Self-pay | Admitting: Family

## 2023-04-03 DIAGNOSIS — M792 Neuralgia and neuritis, unspecified: Secondary | ICD-10-CM

## 2023-04-03 DIAGNOSIS — M797 Fibromyalgia: Secondary | ICD-10-CM

## 2023-04-04 ENCOUNTER — Telehealth: Payer: Self-pay

## 2023-04-05 ENCOUNTER — Encounter: Payer: Self-pay | Admitting: Dermatology

## 2023-04-14 ENCOUNTER — Ambulatory Visit: Payer: Medicaid Other | Admitting: Family

## 2023-04-14 ENCOUNTER — Encounter: Payer: Self-pay | Admitting: Family

## 2023-04-14 VITALS — BP 140/70 | HR 83 | Ht 66.0 in | Wt 248.8 lb

## 2023-04-14 DIAGNOSIS — M792 Neuralgia and neuritis, unspecified: Secondary | ICD-10-CM

## 2023-04-14 DIAGNOSIS — E782 Mixed hyperlipidemia: Secondary | ICD-10-CM

## 2023-04-14 DIAGNOSIS — E559 Vitamin D deficiency, unspecified: Secondary | ICD-10-CM

## 2023-04-14 DIAGNOSIS — M797 Fibromyalgia: Secondary | ICD-10-CM

## 2023-04-14 DIAGNOSIS — Z1211 Encounter for screening for malignant neoplasm of colon: Secondary | ICD-10-CM

## 2023-04-14 DIAGNOSIS — R7303 Prediabetes: Secondary | ICD-10-CM | POA: Diagnosis not present

## 2023-04-14 DIAGNOSIS — E538 Deficiency of other specified B group vitamins: Secondary | ICD-10-CM

## 2023-04-14 DIAGNOSIS — N3946 Mixed incontinence: Secondary | ICD-10-CM

## 2023-04-14 DIAGNOSIS — I10 Essential (primary) hypertension: Secondary | ICD-10-CM

## 2023-04-14 DIAGNOSIS — Z122 Encounter for screening for malignant neoplasm of respiratory organs: Secondary | ICD-10-CM

## 2023-04-14 DIAGNOSIS — R5383 Other fatigue: Secondary | ICD-10-CM

## 2023-04-14 MED ORDER — LORATADINE 10 MG PO TABS: 10.0000 mg | ORAL_TABLET | Freq: Every day | ORAL | 3 refills | Status: DC | PRN

## 2023-04-14 MED ORDER — GABAPENTIN 800 MG PO TABS
800.0000 mg | ORAL_TABLET | Freq: Three times a day (TID) | ORAL | 5 refills | Status: DC
Start: 2023-04-14 — End: 2024-04-05

## 2023-04-14 MED ORDER — CYCLOBENZAPRINE HCL 10 MG PO TABS: 10.0000 mg | ORAL_TABLET | Freq: Three times a day (TID) | ORAL | 1 refills | Status: DC | PRN

## 2023-04-14 MED ORDER — RYBELSUS 7 MG PO TABS: 7.0000 mg | ORAL_TABLET | Freq: Every day | ORAL | 1 refills | Status: DC

## 2023-04-14 MED ORDER — AMLODIPINE BESY-BENAZEPRIL HCL 5-20 MG PO CAPS: 1.0000 | ORAL_CAPSULE | Freq: Every day | ORAL | 1 refills | Status: DC

## 2023-04-14 NOTE — Assessment & Plan Note (Signed)
Checking labs today.  Continue current therapy for lipid control. Will modify as needed based on labwork results.  

## 2023-04-14 NOTE — Assessment & Plan Note (Signed)
Patient stable.  Well controlled with current therapy.   Continue current meds.  

## 2023-04-14 NOTE — Progress Notes (Signed)
Established Patient Office Visit  Subjective:  Patient ID: Maury Guerino Yankey, female    DOB: [birth year]  Age: 52 y.o. MRN: [id number]  Chief Complaint  Patient presents with   Follow-up    4 mo    HPI  No other concerns at this time.   Past Medical History:  Diagnosis Date   Abnormal EKG    Acute right-sided low back pain with right-sided sciatica 08/30/2014   Anxiety    Arthritis    Asthma    in past, no current inhalers   Bipolar disorder (HCC)    Breathlessness on exertion 04/20/2014   Cannabis use disorder    Cervical spinal cord compression (HCC) 05/07/2012   Chronic pain syndrome    COPD (chronic obstructive pulmonary disease) (HCC)    DDD (degenerative disc disease), lumbar    Depression    DOE (dyspnea on exertion)    Dysrhythmia    irregular-h/o in the past   Extreme obesity    Fibromyalgia    H/O syncope 04/25/2014   High risk medication use 06/23/2019   Hyperlipidemia    Hypertension    Hypokalemia    Hyponatremia 05/07/2021   Long term current use of anticoagulant therapy (Lovenox) 05/06/2016   Lumbar canal stenosis    Marijuana smoker    Neuritis or radiculitis due to rupture of lumbar intervertebral disc 05/23/2014   Noncompliance with treatment regimen 05/01/2020   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   a.) presurgical PCR (+) 02/02/2019 prior to LEFT TKA; b.) presurgical PCR (+) 03/06/2019 prior to RIGHT REVERSE SHOULDER/BICEPS TENODESIS; c.) presurgical PCR (+) 01/17/2022 prior to LEFT SHOULDER ARTHROSCOPY/OPEN ROTATOR CUFF REPAIR/SUBACROMIAL DECOMPRESSION/DISTAL CLAVICLE EXCISION/OPEN BICEPS TENODESIS   Partner relational problem 10/05/2021   Periprosthetic fracture around prosthetic knee 07/31/2015   Personal history of perinatal problems 04/25/2014   Pharmacologic therapy 11/30/2019   Pre-diabetes    Restless leg syndrome    Soft tissue lesion of shoulder region 01/24/2012   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   Syncope and collapse 04/08/2014   last was 2023   Thoracic and lumbosacral neuritis 05/07/2012   Thoracic neuritis 05/07/2012   Tobacco use disorder    Vitamin D deficiency     Past Surgical History:  Procedure Laterality Date   ABDOMINAL HYSTERECTOMY     BICEPT TENODESIS Left 03/04/2023   Procedure: Left reverse shoulder arthroplasty, biceps tenodesis; open distal clavicle excision and AC joint cyst excision;  Surgeon: Signa Kell, MD;  Location: [facility];  Service: Orthopedics;  Laterality: Left;   Cervical Fusion X 2     JOINT REPLACEMENT Right 01/2016   Dr Gavin Potters   KNEE ARTHROPLASTY Left 02/10/2019   Procedure: COMPUTER ASSISTED TOTAL KNEE ARTHROPLASTY - RNFA;  Surgeon: Donato Heinz, MD;  Location: [facility];  Service: Orthopedics;  Laterality: Left;   KNEE ARTHROSCOPY Bilateral    Right knee scope 1998, Left knee scope   RESECTION DISTAL CLAVICAL Left 03/04/2023   Procedure: Left reverse shoulder arthroplasty, biceps tenodesis; open distal clavicle excision and AC joint cyst excision;  Surgeon: Signa Kell, MD;  Location: [facility];  Service: Orthopedics;  Laterality: Left;   REVERSE SHOULDER ARTHROPLASTY Right 03/12/2021   Procedure: Right reverse shoulder arthroplasty, biceps tenodesis;  Surgeon: Signa Kell, MD;  Location: [facility];  Service: Orthopedics;  Laterality: Right;   REVERSE SHOULDER ARTHROPLASTY Left 03/04/2023   Procedure: Left reverse shoulder arthroplasty, biceps tenodesis; open  distal clavicle excision and AC joint cyst excision;  Surgeon: Signa Kell, MD;  Location: [facility];  Service: Orthopedics;  Laterality: Left;   SHOULDER ARTHROSCOPY WITH SUBACROMIAL DECOMPRESSION AND OPEN ROTATOR C Left 06/18/2021   Procedure: Left shoulder arthroscopy, Open rotator cuff repair, subacromial decompression, distal clavicle excision and, open biceps tenodesis;  Surgeon: Signa Kell, MD;  Location: [facility];  Service:  Orthopedics;  Laterality: Left;   TONSILLECTOMY     TOTAL KNEE ARTHROPLASTY Right 02/01/2015   Procedure: TOTAL KNEE ARTHROPLASTY;  Surgeon: Erin Sons, MD;  Location: [facility];  Service: Orthopedics;  Laterality: Right;   TUBAL LIGATION      Social History   Socioeconomic History   Marital status: Married    Spouse name: Molly Maduro   Number of children: 1   Years of education: Not on file   Highest education level: High school graduate  Occupational History   Not on file  Tobacco Use   Smoking status: Every Day    Current packs/day: 0.00    Average packs/day: 1 pack/day for 30.0 years (30.0 ttl pk-yrs)    Types: Cigarettes    Start date: 09/12/1991    Last attempt to quit: 09/11/2021    Years since quitting: 1.5   Smokeless tobacco: Never  Vaping Use   Vaping status: Never Used  Substance and Sexual Activity   Alcohol use: No   Drug use: Yes    Types: Marijuana    Comment: every day   Sexual activity: Not Currently  Other Topics Concern   Not on file  Social History Narrative   Not on file   Social Determinants of Health   Financial Resource Strain: Low Risk  (09/16/2018)   Overall Financial Resource Strain (CARDIA)    Difficulty of Paying Living Expenses: Not hard at all  Food Insecurity: No Food Insecurity (09/16/2018)   Hunger Vital Sign    Worried About Running Out of Food in the Last Year: Never true    Ran Out of Food in the Last Year: Never true  Transportation Needs: No Transportation Needs (09/16/2018)   PRAPARE - Administrator, Civil Service (Medical): No    Lack of Transportation (Non-Medical): No  Physical Activity: Inactive (09/16/2018)   Exercise Vital Sign    Days of Exercise per Week: 0 days    Minutes of Exercise per Session: 0 min  Stress: Stress Concern Present (09/16/2018)   [employer] of Occupational Health - Occupational Stress Questionnaire    Feeling of Stress : To some extent  Social Connections: Unknown (09/16/2018)    Social Connection and Isolation Panel [NHANES]    Frequency of Communication with Friends and Family: Not on file    Frequency of Social Gatherings with Friends and Family: Not on file    Attends Religious Services: Never    Active Member of Clubs or Organizations: No    Attends Banker Meetings: Never    Marital Status: Separated  Intimate Partner Violence: Not At Risk (09/16/2018)   Humiliation, Afraid, Rape, and Kick questionnaire    Fear of Current or Ex-Partner: No    Emotionally Abused: No    Physically Abused: No    Sexually Abused: No    Family History  Problem Relation Age of Onset   Breast cancer Mother 34   Colon cancer Father    Heart attack Father    Bipolar disorder Sister    Depression Sister    Schizophrenia Sister  No Known Allergies  ROS     Objective:   BP (!) 140/70   Pulse 83   Ht 5\' 6"  (1.676 m)   Wt 248 lb 12.8 oz (112.9 kg)   SpO2 99%   BMI 40.16 kg/m   Vitals:   04/14/23 0849  BP: (!) 140/70  Pulse: 83  Height: 5\' 6"  (1.676 m)  Weight: 248 lb 12.8 oz (112.9 kg)  SpO2: 99%  BMI (Calculated): 40.18    Physical Exam   No results found for any visits on 04/14/23.  Recent Results (from the past 2160 hour(s))  Hemoglobin A1c     Status: Abnormal   Collection Time: 02/19/23 10:45 AM  Result Value Ref Range   Hgb A1c MFr Bld 6.1 (H) 4.8 - 5.6 %    Comment: (NOTE) Pre diabetes:          5.7%-6.4%  Diabetes:              >6.4%  Glycemic control for   <7.0% adults with diabetes    Mean Plasma Glucose 128.37 mg/dL    Comment: Performed at [facility], [address] [place], [place] [address]  CBC WITH DIFFERENTIAL     Status: None   Collection Time: 02/19/23 10:45 AM  Result Value Ref Range   WBC 7.3 4.0 - 10.5 K/uL   RBC 4.76 3.87 - 5.11 MIL/uL   Hemoglobin 14.7 12.0 - 15.0 g/dL   HCT 60.4 54.0 - 98.1 %   MCV 88.2 80.0 - 100.0 fL   MCH 30.9 26.0 - 34.0 pg   MCHC 35.0 30.0 - 36.0 g/dL   RDW 19.1 47.8  - 29.5 %   Platelets 242 150 - 400 K/uL   nRBC 0.0 0.0 - 0.2 %   Neutrophils Relative % 60 %   Neutro Abs 4.3 1.7 - 7.7 K/uL   Lymphocytes Relative 31 %   Lymphs Abs 2.3 0.7 - 4.0 K/uL   Monocytes Relative 6 %   Monocytes Absolute 0.4 0.1 - 1.0 K/uL   Eosinophils Relative 2 %   Eosinophils Absolute 0.2 0.0 - 0.5 K/uL   Basophils Relative 1 %   Basophils Absolute 0.0 0.0 - 0.1 K/uL   Immature Granulocytes 0 %   Abs Immature Granulocytes 0.02 0.00 - 0.07 K/uL    Comment: Performed at [facility], [address] [place], [place] [address]  Comprehensive metabolic panel     Status: Abnormal   Collection Time: 02/19/23 10:45 AM  Result Value Ref Range   Sodium 136 135 - 145 mmol/L   Potassium 4.1 3.5 - 5.1 mmol/L   Chloride 104 98 - 111 mmol/L   CO2 27 22 - 32 mmol/L   Glucose, Bld 107 (H) 70 - 99 mg/dL    Comment: Glucose reference range applies only to samples taken after fasting for at least 8 hours.   BUN 16 6 - 20 mg/dL   Creatinine, Ser 8.65 (H) 0.44 - 1.00 mg/dL   Calcium 9.2 8.9 - 78.4 mg/dL   Total Protein 7.2 6.5 - 8.1 g/dL   Albumin 4.3 3.5 - 5.0 g/dL   AST 22 15 - 41 U/L   ALT 20 0 - 44 U/L   Alkaline Phosphatase 79 38 - 126 U/L   Total Bilirubin 0.6 0.3 - 1.2 mg/dL   GFR, Estimated >69 >62 mL/min    Comment: (NOTE) Calculated using the CKD-EPI Creatinine Equation (2021)    Anion gap 5 5 -  15    Comment: Performed at [facility], [address] [place], [place] [address]  Urinalysis, Routine w reflex microscopic -Urine, Clean Catch     Status: Abnormal   Collection Time: 02/19/23 10:45 AM  Result Value Ref Range   Color, Urine STRAW (A) YELLOW   APPearance CLEAR (A) CLEAR   Specific Gravity, Urine 1.004 (L) 1.005 - 1.030   pH 6.0 5.0 - 8.0   Glucose, UA NEGATIVE NEGATIVE mg/dL   Hgb urine dipstick NEGATIVE NEGATIVE   Bilirubin Urine NEGATIVE NEGATIVE   Ketones, ur NEGATIVE NEGATIVE mg/dL   Protein, ur NEGATIVE NEGATIVE mg/dL   Nitrite  NEGATIVE NEGATIVE   Leukocytes,Ua NEGATIVE NEGATIVE    Comment: Performed at [facility], [address], [place], [place] [address]  Type and screen Order type and screen if day of surgery is less than 15 days from draw of preadmission visit or order morning of surgery if day of surgery is greater than 6 days from preadmission visit.     Status: None   Collection Time: 02/19/23 10:45 AM  Result Value Ref Range   ABO/RH(D) AB POS    Antibody Screen NEG    Sample Expiration 03/05/2023,2359    Extend sample reason      NO TRANSFUSIONS OR PREGNANCY IN THE PAST 3 MONTHS Performed at [facility], [address], [place], [place] [address]   Surgical pcr screen     Status: None   Collection Time: 02/19/23 10:46 AM   Specimen: Nasal Mucosa; Nasal Swab  Result Value Ref Range   MRSA, PCR NEGATIVE NEGATIVE   Staphylococcus aureus NEGATIVE NEGATIVE    Comment: (NOTE) The Xpert SA Assay (FDA approved for NASAL specimens in patients 62 years of age and older), is one component of a comprehensive surveillance program. It is not intended to diagnose infection nor to guide or monitor treatment. Performed at [facility], [address] [place], [place] [address]   Lipid panel     Status: Abnormal   Collection Time: 02/20/23 10:17 AM  Result Value Ref Range   Cholesterol, Total 170 100 - 199 mg/dL   Triglycerides 841 0 - 149 mg/dL   HDL 41 >32 mg/dL   VLDL Cholesterol Cal 26 5 - 40 mg/dL   LDL Chol Calc (NIH) 440 (H) 0 - 99 mg/dL   Chol/HDL Ratio 4.1 0.0 - 4.4 ratio    Comment:                                   T. Chol/HDL Ratio                                             Men  Women                               1/2 Avg.Risk  3.4    3.3                                   Avg.Risk  5.0    4.4  2X Avg.Risk  9.6    7.1                                3X Avg.Risk 23.4   11.0   VITAMIN D 25 Hydroxy (Vit-D Deficiency, Fractures)      Status: None   Collection Time: 02/20/23 10:17 AM  Result Value Ref Range   Vit D, 25-Hydroxy 42.8 30.0 - 100.0 ng/mL    Comment: Vitamin D deficiency has been defined by the Institute of Medicine and an Endocrine Society practice guideline as a level of serum 25-OH vitamin D less than 20 ng/mL (1,2). The Endocrine Society went on to further define vitamin D insufficiency as a level between 21 and 29 ng/mL (2). 1. IOM (Institute of Medicine). 2010. Dietary reference    intakes for calcium and D. [place]: The    [employer]. 2. Holick MF, Binkley [place], Bischoff-Ferrari HA, et al.    Evaluation, treatment, and prevention of vitamin D    deficiency: an Endocrine Society clinical practice    guideline. JCEM. 2011 Jul; 96(7):1911-30.   CMP14+EGFR     Status: Abnormal   Collection Time: 02/20/23 10:17 AM  Result Value Ref Range   Glucose 97 70 - 99 mg/dL   BUN 17 6 - 24 mg/dL   Creatinine, Ser 4.09 (H) 0.57 - 1.00 mg/dL   eGFR 64 >81 XB/JYN/8.29   BUN/Creatinine Ratio 16 9 - 23   Sodium 139 134 - 144 mmol/L   Potassium 4.7 3.5 - 5.2 mmol/L   Chloride 101 96 - 106 mmol/L   CO2 25 20 - 29 mmol/L   Calcium 9.9 8.7 - 10.2 mg/dL   Total Protein 6.7 6.0 - 8.5 g/dL   Albumin 4.4 3.8 - 4.9 g/dL   Globulin, Total 2.3 1.5 - 4.5 g/dL   Bilirubin Total 0.3 0.0 - 1.2 mg/dL   Alkaline Phosphatase 97 44 - 121 IU/L   AST 18 0 - 40 IU/L   ALT 18 0 - 32 IU/L  TSH     Status: None   Collection Time: 02/20/23 10:17 AM  Result Value Ref Range   TSH 0.531 0.450 - 4.500 uIU/mL  Hemoglobin A1c     Status: Abnormal   Collection Time: 02/20/23 10:17 AM  Result Value Ref Range   Hgb A1c MFr Bld 6.1 (H) 4.8 - 5.6 %    Comment:          Prediabetes: 5.7 - 6.4          Diabetes: >6.4          Glycemic control for adults with diabetes: <7.0    Est. average glucose Bld gHb Est-mCnc 128 mg/dL  Vitamin F62     Status: None   Collection Time: 02/20/23 10:17 AM  Result Value Ref Range    Vitamin B-12 438 232 - 1,245 pg/mL  Hepatitis C Ab reflex to Quant PCR     Status: None   Collection Time: 02/20/23 10:17 AM  Result Value Ref Range   HCV Ab Non Reactive Non Reactive  HIV antibody (with reflex)     Status: None   Collection Time: 02/20/23 10:17 AM  Result Value Ref Range   HIV Screen 4th Generation wRfx Non Reactive Non Reactive    Comment: HIV-1/HIV-2 antibodies and HIV-1 p24 antigen were NOT detected. There is no laboratory evidence of HIV infection. HIV Negative   Interpretation:  Status: None   Collection Time: 02/20/23 10:17 AM  Result Value Ref Range   HCV Interp 1: Comment     Comment: Not infected with HCV unless early or acute infection is suspected (which Alfrey be delayed in an immunocompromised individual), or other evidence exists to indicate HCV infection.   POC CREATINE & ALBUMIN,URINE     Status: None   Collection Time: 02/20/23 11:42 AM  Result Value Ref Range   Creatinine, POC 80 mg/dL   Albumin/Creatinine Ratio, Urine, POC <30   Surgical pathology     Status: None   Collection Time: 03/04/23 12:00 AM  Result Value Ref Range   SURGICAL PATHOLOGY      SURGICAL PATHOLOGY [facility] [address] [place], [place] [address] Telephone [phone] or [phone] Fax [phone]  REPORT OF SURGICAL PATHOLOGY   Accession #: [id number] Patient Name: EMILI, ABDULRAHMAN Visit # : [id number]  MRN: [id number] Physician: Signa Kell DOB/Age [birth year] (Age: 84) Gender: F Collected Date: 03/04/2023 Received Date: 03/04/2023  FINAL DIAGNOSIS       1. Humeral head, Left :       - DEGENERATIVE OSTEOARTHROPATHY.       2. Cyst, biopsy, Left acromioclavicular joint :       - SYNOVIAL/GANGLION CYST.       3. Cyst, biopsy, Left chest wall :       - SYNOVIAL/GANGLION CYST.       ELECTRONIC SIGNATURE : Rubinas Md, Delice Bison , Sports administrator, Electronic Signature  MICROSCOPIC DESCRIPTION  CASE COMMENTS STAINS USED  IN DIAGNOSIS: H&E H&E H&E H&E    CLINICAL HISTORY  SPECIMEN(S) OBTAINED 1. Humeral head, Left 2. Cyst, biopsy, Left Acromioclavicular Joint 3. Cyst, biopsy, Left Chest Wa ll  SPECIMEN COMMENTS: SPECIMEN CLINICAL INFORMATION:    Gross Description 1. Received fresh and placed in formalin, labeled "left humeral head" is a 5.0 x 5.0 cm fragment of humeral head up to 1.4 cm thick.The articular surface features multiple sutures extending into the bone marrow.There is prominen eburnation at the superior articular surface (<50% of the total surface area), and mild-to-moderate osteophytic lipping at the periphery.The resection margin is smooth and regular.Sectioning reveals tan-white cortical bone which is < 0.1 cm sick at the superior aspect and 0.2 cm thick peripherally.No masses or lesions are grossly identified.Representative sections are submitted as follows, following decalcification:      1A: Central bone, including area of eburnation      1B: Peripheral bone, including osteophytic lipping      SMB      03/05/2023 2. Received fresh and placed in formalin on 03/04/23 at 11:33 a.m., labeled "left acromioclavicular joint cyst", is a 2.5  x 1.8 x 1.0 cm  aggregate of multiple soft tissue fragments.The fragments consists of adipose and firm, tan-gray osteocartilaginous tissue.Sectioning reveals a 1.2 x 0.7 x 0.7 cm focally cystic cut surface within the firm tissue.Representative sections are submitted  block 2A. 3. Received fresh placed in formalin on 03/04/23 at 11:33 a.m., labeled "left chest wall cyst", is a 3.0 x 2.5 x 2.2 cm fragment of partially disrupted adipose and fibrous tissue.Sectioning reveals a disrupted cystic structure 1.0 cm in greatest dimension and gelatinous, tenacious cystic material.Representative sections are submitted in block 3A.      SMB      03/05/2023        Report signed out from the following location(s) [place] [address] [place], [place] [address] CLIA #:  [id number]  [facility] [address] [place], [place] [address] CLIA #: [id number]        Assessment & Plan:   Problem List Items Addressed This Visit       Active Problems   Fibromyalgia (Chronic)   Relevant Medications   gabapentin (NEURONTIN) 800 MG tablet   cyclobenzaprine (FLEXERIL) 10 MG tablet   Other Relevant Orders   CMP14+EGFR   CBC with Diff   Essential hypertension, benign    Blood pressure well controlled with current medications.  Continue current therapy.  Will reassess at follow up.       Relevant Medications   amLODipine-benazepril (LOTREL) 5-20 MG capsule   Other Relevant Orders   CMP14+EGFR   CBC with Diff   Vitamin D deficiency, unspecified    Checking labs today.  Will continue supplements as needed.        Relevant Orders   VITAMIN D 25 Hydroxy (Vit-D Deficiency, Fractures)   CMP14+EGFR   CBC with Diff   Hyperlipidemia    Checking labs today.  Continue current therapy for lipid control. Will modify as needed based on labwork results.       Relevant Medications   amLODipine-benazepril (LOTREL) 5-20 MG capsule   Other Relevant Orders   Lipid panel   CMP14+EGFR   CBC with Diff   Neurogenic pain (Chronic)   Relevant Medications   gabapentin (NEURONTIN) 800 MG tablet   Other Relevant Orders   CMP14+EGFR   CBC with Diff   Mixed stress and urge urinary incontinence    Patient stable.  Well controlled with current therapy.   Continue current meds.       Prediabetes - Primary    Patient educated on foods that contain carbohydrates and the need to decrease intake.  We discussed prediabetes, and what it means and the need for strict dietary control to prevent progression to type 2 diabetes.  Advised to decrease intake of sugary drinks, including sodas, sweet tea, and some juices, and of starch and sugar heavy foods (ie., potatoes, rice, bread, pasta, desserts).  She verbalizes understanding and agreement with the changes discussed today.  A1C Continues to be in prediabetic ranges.  Will reassess at follow up after next lab check.  Patient counseled on dietary choices and verbalized understanding.        Relevant Orders   CMP14+EGFR   Hemoglobin A1c   CBC with Diff   Other Visit Diagnoses     Other fatigue       Relevant Orders   CMP14+EGFR   TSH   CBC with Diff   B12 deficiency due to diet       Relevant Orders   CMP14+EGFR   Vitamin B12   CBC with Diff   Screening for malignant neoplasm of colon       Cologuard ordered for pt.  She has been given information on how to complete and return.  Will call with results.   Relevant Orders   Cologuard   Encounter for screening for lung cancer       CT Scan ordered in office today. Will call with results when available.   Relevant Orders   CT CHEST LUNG CANCER SCREENING LOW DOSE WO CONTRAST       Return in about 4 months (around 08/12/2023).   Total time spent: 30 minutes  Miki Kins, FNP  04/14/2023   This document L[place]inen have been prepared by [facility] Voice Recognition software and as such  Bleau include unintentional dictation errors.

## 2023-04-14 NOTE — Assessment & Plan Note (Signed)
Checking labs today.  Will continue supplements as needed.  

## 2023-04-14 NOTE — Assessment & Plan Note (Signed)
Blood pressure well controlled with current medications.  Continue current therapy.  Will reassess at follow up.  

## 2023-04-14 NOTE — Assessment & Plan Note (Signed)
Patient educated on foods that contain carbohydrates and the need to decrease intake.  We discussed prediabetes, and what it means and the need for strict dietary control to prevent progression to type 2 diabetes.  Advised to decrease intake of sugary drinks, including sodas, sweet tea, and some juices, and of starch and sugar heavy foods (ie., potatoes, rice, bread, pasta, desserts). She verbalizes understanding and agreement with the changes discussed today.   A1C Continues to be in prediabetic ranges.  Will reassess at follow up after next lab check.  Patient counseled on dietary choices and verbalized understanding.   

## 2023-04-15 LAB — LIPID PANEL
Chol/HDL Ratio: 3.9 {ratio} (ref 0.0–4.4)
Cholesterol, Total: 161 mg/dL (ref 100–199)
HDL: 41 mg/dL (ref >39)
LDL Chol Calc (NIH): 92 mg/dL (ref 0–99)
Triglycerides: 160 mg/dL — ABNORMAL HIGH (ref 0–149)
VLDL Cholesterol Cal: 28 mg/dL (ref 5–40)

## 2023-04-15 LAB — CBC WITH DIFFERENTIAL/PLATELET
Basophils Absolute: 0.1 10*3/uL (ref 0.0–0.2)
Basos: 1 %
EOS (ABSOLUTE): 0.2 10*3/uL (ref 0.0–0.4)
Eos: 3 %
Hematocrit: 42.5 % (ref 34.0–46.6)
Hemoglobin: 13.5 g/dL (ref 11.1–15.9)
Immature Grans (Abs): 0 10*3/uL (ref 0.0–0.1)
Immature Granulocytes: 0 %
Lymphocytes Absolute: 2.3 10*3/uL (ref 0.7–3.1)
Lymphs: 36 %
MCH: 29.4 pg (ref 26.6–33.0)
MCHC: 31.8 g/dL (ref 31.5–35.7)
MCV: 93 fL (ref 79–97)
Monocytes Absolute: 0.4 10*3/uL (ref 0.1–0.9)
Monocytes: 6 %
Neutrophils Absolute: 3.5 10*3/uL (ref 1.4–7.0)
Neutrophils: 54 %
Platelets: 307 10*3/uL (ref 150–450)
RBC: 4.59 x10E6/uL (ref 3.77–5.28)
RDW: 12.6 % (ref 11.7–15.4)
WBC: 6.5 10*3/uL (ref 3.4–10.8)

## 2023-04-15 LAB — CMP14+EGFR
ALT: 11 [IU]/L (ref 0–32)
AST: 14 [IU]/L (ref 0–40)
Albumin: 4.4 g/dL (ref 3.8–4.9)
Alkaline Phosphatase: 118 [IU]/L (ref 44–121)
BUN/Creatinine Ratio: 16 (ref 9–23)
BUN: 16 mg/dL (ref 6–24)
Bilirubin Total: <0.2 mg/dL (ref 0.0–1.2)
CO2: 24 mmol/L (ref 20–29)
Calcium: 9.6 mg/dL (ref 8.7–10.2)
Chloride: 101 mmol/L (ref 96–106)
Creatinine, Ser: 1.03 mg/dL — ABNORMAL HIGH (ref 0.57–1.00)
Globulin, Total: 2.4 g/dL (ref 1.5–4.5)
Glucose: 121 mg/dL — ABNORMAL HIGH (ref 70–99)
Potassium: 4.7 mmol/L (ref 3.5–5.2)
Sodium: 138 mmol/L (ref 134–144)
Total Protein: 6.8 g/dL (ref 6.0–8.5)
eGFR: 65 mL/min/{1.73_m2} (ref >59)

## 2023-04-15 LAB — VITAMIN D 25 HYDROXY (VIT D DEFICIENCY, FRACTURES): Vit D, 25-Hydroxy: 35.3 ng/mL (ref 30.0–100.0)

## 2023-04-15 LAB — TSH: TSH: 0.883 u[IU]/mL (ref 0.450–4.500)

## 2023-04-15 LAB — VITAMIN B12: Vitamin B-12: 332 pg/mL (ref 232–1245)

## 2023-04-15 LAB — HEMOGLOBIN A1C
Est. average glucose Bld gHb Est-mCnc: 128 mg/dL
Hgb A1c MFr Bld: 6.1 % — ABNORMAL HIGH (ref 4.8–5.6)

## 2023-04-16 NOTE — Telephone Encounter (Signed)
error 

## 2023-04-22 ENCOUNTER — Telehealth: Payer: Self-pay

## 2023-04-22 DIAGNOSIS — G4709 Other insomnia: Secondary | ICD-10-CM

## 2023-04-22 DIAGNOSIS — F3178 Bipolar disorder, in full remission, most recent episode mixed: Secondary | ICD-10-CM

## 2023-04-22 MED ORDER — QUETIAPINE FUMARATE 25 MG PO TABS
25.0000 mg | ORAL_TABLET | Freq: Every day | ORAL | 1 refills | Status: DC
Start: 2023-04-22 — End: 2023-06-17

## 2023-04-22 NOTE — Telephone Encounter (Signed)
Contacted patient patient reports she is having difficulty sleeping.  She drinks tea all day and although acknowledges that it does have caffeine not interested in cutting back or making any changes.  Patient also in a lot of pain although pain Juarez have improved compared to last visit.  Patient does not have mattress/bed to sleep on and reports she sleeps on a recliner.  Hence sleep issues likely multifactorial patient however reports she does not have anything to change and is interested in medication management.  Attempted to provide education.  Agreeable to trial of Seroquel 25 mg at bedtime.  Patient to reduce the trazodone to 100 mg if the Seroquel and the trazodone combination makes her too groggy.  Could use the trazodone as needed.

## 2023-04-22 NOTE — Telephone Encounter (Signed)
pt called states that she needs the trazodone increase that the melatonin is not working for her. she still can not sleep

## 2023-04-23 ENCOUNTER — Telehealth: Payer: Self-pay

## 2023-04-23 NOTE — Telephone Encounter (Signed)
went online to [email or web address] and submitted the prior auth . - pending 

## 2023-04-23 NOTE — Telephone Encounter (Signed)
pt called states that a prior auth is needed for the quetiapine.

## 2023-04-23 NOTE — Telephone Encounter (Signed)
Prior Berkley Harvey was approved PA Case #[id number] from 04-23-23 to 04-22-24

## 2023-05-08 ENCOUNTER — Other Ambulatory Visit: Payer: Self-pay | Admitting: Family

## 2023-05-09 ENCOUNTER — Encounter: Payer: Self-pay | Admitting: Psychiatry

## 2023-05-09 ENCOUNTER — Telehealth (INDEPENDENT_AMBULATORY_CARE_PROVIDER_SITE_OTHER): Payer: Medicaid Other | Admitting: Psychiatry

## 2023-05-09 DIAGNOSIS — F411 Generalized anxiety disorder: Secondary | ICD-10-CM

## 2023-05-09 DIAGNOSIS — F1220 Cannabis dependence, uncomplicated: Secondary | ICD-10-CM | POA: Diagnosis not present

## 2023-05-09 DIAGNOSIS — F410 Panic disorder [episodic paroxysmal anxiety] without agoraphobia: Secondary | ICD-10-CM

## 2023-05-09 DIAGNOSIS — F3178 Bipolar disorder, in full remission, most recent episode mixed: Secondary | ICD-10-CM

## 2023-05-09 DIAGNOSIS — G4701 Insomnia due to medical condition: Secondary | ICD-10-CM | POA: Diagnosis not present

## 2023-05-09 DIAGNOSIS — F17200 Nicotine dependence, unspecified, uncomplicated: Secondary | ICD-10-CM

## 2023-05-09 MED ORDER — TRAZODONE HCL 100 MG PO TABS
100.0000 mg | ORAL_TABLET | Freq: Every evening | ORAL | Status: DC | PRN
Start: 2023-05-09 — End: 2023-08-27

## 2023-05-09 NOTE — Progress Notes (Signed)
Virtual Visit via Video Note  I connected with Shelly Wright on 05/09/23 at 10:00 AM EST by a video enabled telemedicine application and verified that I am speaking with the correct person using two identifiers.  Location Provider Location : [facility] Patient Location : Home  Participants: Patient , Provider   I discussed the limitations of evaluation and management by telemedicine and the availability of in person appointments. The patient expressed understanding and agreed to proceed.   I discussed the assessment and treatment plan with the patient. The patient was provided an opportunity to ask questions and all were answered. The patient agreed with the plan and demonstrated an understanding of the instructions.   The patient was advised to call back or seek an in-person evaluation if the symptoms worsen or if the condition fails to improve as anticipated.   BH MD  OP Progress Note  05/09/2023 10:18 AM Shelly Wright  MRN:  [id number]  Chief Complaint:  Chief Complaint  Patient presents with   Follow-up   Depression   Anxiety   Insomnia   Medication Refill   HPI: [name] is a 52 year old Caucasian female, on SSI, has a history of bipolar disorder, GAD, panic disorder, cannabis use disorder, tobacco use disorder, married, lives in [place] was evaluated by telemedicine today.  Patient today presented as cheerful, pleasant reports she looks forward to Christmas which is coming up soon.  She is planning to spend time with her daughter who lives 35 minutes away.  Patient is currently compliant on the Seroquel which was recently added, takes a low dose of 25 mg at bedtime.  Denies any side effects.  The Seroquel has definitely helped with her mood symptoms and sleep problems.  For sleep she continues to take trazodone 100 mg as needed and over-the-counter melatonin 10 mg.  Patient does continue to have challenges with her pain control although she is not on any opioid pain  medications at this time.  She was prescribed tramadol recently how she has been noncompliant.  Patient with long term history of cannabis use as well as smoking cigarettes, not interested in quitting.  Patient currently denies any suicidality, homicidality or perceptual disturbances.  Patient denies any other concerns today.  Visit Diagnosis:    ICD-10-CM   1. Bipolar disorder, in full remission, most recent episode mixed (HCC)  F31.78 traZODone (DESYREL) 100 MG tablet    2. GAD (generalized anxiety disorder)  F41.1 traZODone (DESYREL) 100 MG tablet    3. Panic disorder  F41.0     4. Insomnia due to medical condition  G47.01    multifactorial inclduing mood , pain    5. Cannabis use disorder, moderate, dependence (HCC)  F12.20     6. Tobacco use disorder  F17.200       Past Psychiatric History: I have reviewed past psychiatric history from progress note on 09/16/2018.  Past trials of venlafaxine, trazodone, Seroquel, hydroxyzine, Zoloft, Lexapro, duloxetine, Paxil, Abilify, Wellbutrin  Past Medical History:  Past Medical History:  Diagnosis Date   Abnormal EKG    Acute right-sided low back pain with right-sided sciatica 08/30/2014   Anxiety    Arthritis    Asthma    in past, no current inhalers   Bipolar disorder (HCC)    Breathlessness on exertion 04/20/2014   Cannabis use disorder    Cervical spinal cord compression (HCC) 05/07/2012   Chronic pain syndrome    COPD (chronic obstructive pulmonary disease) (HCC)  DDD (degenerative disc disease), lumbar    Depression    DOE (dyspnea on exertion)    Dysrhythmia    irregular-h/o in the past   Extreme obesity    Fibromyalgia    H/O syncope 04/25/2014   High risk medication use 06/23/2019   Hyperlipidemia    Hypertension    Hypokalemia    Hyponatremia 05/07/2021   Long term current use of anticoagulant therapy (Lovenox) 05/06/2016   Lumbar canal stenosis    Marijuana smoker    Neuritis or radiculitis due to  rupture of lumbar intervertebral disc 05/23/2014   Noncompliance with treatment regimen 05/01/2020   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   a.) presurgical PCR (+) 02/02/2019 prior to LEFT TKA; b.) presurgical PCR (+) 03/06/2019 prior to RIGHT REVERSE SHOULDER/BICEPS TENODESIS; c.) presurgical PCR (+) 01/17/2022 prior to LEFT SHOULDER ARTHROSCOPY/OPEN ROTATOR CUFF REPAIR/SUBACROMIAL DECOMPRESSION/DISTAL CLAVICLE EXCISION/OPEN BICEPS TENODESIS   Partner relational problem 10/05/2021   Periprosthetic fracture around prosthetic knee 07/31/2015   Personal history of perinatal problems 04/25/2014   Pharmacologic therapy 11/30/2019   Pre-diabetes    Restless leg syndrome    Soft tissue lesion of shoulder region 01/24/2012   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   Syncope and collapse 04/08/2014   last was 2023   Thoracic and lumbosacral neuritis 05/07/2012   Thoracic neuritis 05/07/2012   Tobacco use disorder    Vitamin D deficiency     Past Surgical History:  Procedure Laterality Date   ABDOMINAL HYSTERECTOMY     BICEPT TENODESIS Left 03/04/2023   Procedure: Left reverse shoulder arthroplasty, biceps tenodesis; open distal clavicle excision and AC joint cyst excision;  Surgeon: Signa Kell, MD;  Location: [facility];  Service: Orthopedics;  Laterality: Left;   Cervical Fusion X 2     JOINT REPLACEMENT Right 01/2016   Dr Gavin Potters   KNEE ARTHROPLASTY Left 02/10/2019   Procedure: COMPUTER ASSISTED TOTAL KNEE ARTHROPLASTY - RNFA;  Surgeon: Donato Heinz, MD;  Location: [facility];  Service: Orthopedics;  Laterality: Left;   KNEE ARTHROSCOPY Bilateral    Right knee scope 1998, Left knee scope   RESECTION DISTAL CLAVICAL Left 03/04/2023   Procedure: Left reverse shoulder arthroplasty, biceps tenodesis; open distal clavicle excision and AC joint cyst excision;  Surgeon: Signa Kell, MD;  Location: [facility];  Service:  Orthopedics;  Laterality: Left;   REVERSE SHOULDER ARTHROPLASTY Right 03/12/2021   Procedure: Right reverse shoulder arthroplasty, biceps tenodesis;  Surgeon: Signa Kell, MD;  Location: [facility];  Service: Orthopedics;  Laterality: Right;   REVERSE SHOULDER ARTHROPLASTY Left 03/04/2023   Procedure: Left reverse shoulder arthroplasty, biceps tenodesis; open distal clavicle excision and AC joint cyst excision;  Surgeon: Signa Kell, MD;  Location: [facility];  Service: Orthopedics;  Laterality: Left;   SHOULDER ARTHROSCOPY WITH SUBACROMIAL DECOMPRESSION AND OPEN ROTATOR C Left 06/18/2021   Procedure: Left shoulder arthroscopy, Open rotator cuff repair, subacromial decompression, distal clavicle excision and, open biceps tenodesis;  Surgeon: Signa Kell, MD;  Location: [facility];  Service: Orthopedics;  Laterality: Left;   TONSILLECTOMY     TOTAL KNEE ARTHROPLASTY Right 02/01/2015   Procedure: TOTAL KNEE ARTHROPLASTY;  Surgeon: Erin Sons, MD;  Location: [facility];  Service: Orthopedics;  Laterality: Right;   TUBAL LIGATION      Family Psychiatric History: I have reviewed family psychiatric history from progress note on 09/16/2018.  Family History:  Family History  Problem Relation Age  of Onset   Breast cancer Mother 35   Colon cancer Father    Heart attack Father    Bipolar disorder Sister    Depression Sister    Schizophrenia Sister     Social History: I have reviewed social history from progress note on 09/16/2018. Social History   Socioeconomic History   Marital status: Married    Spouse name: Molly Maduro   Number of children: 1   Years of education: Not on file   Highest education level: High school graduate  Occupational History   Not on file  Tobacco Use   Smoking status: Every Day    Current packs/day: 0.00    Average packs/day: 1 pack/day for 30.0 years (30.0 ttl pk-yrs)    Types: Cigarettes    Start date: 09/12/1991    Last attempt to quit: 09/11/2021    Years since  quitting: 1.6   Smokeless tobacco: Never  Vaping Use   Vaping status: Never Used  Substance and Sexual Activity   Alcohol use: No   Drug use: Yes    Types: Marijuana    Comment: every day   Sexual activity: Not Currently  Other Topics Concern   Not on file  Social History Narrative   Not on file   Social Drivers of Health   Financial Resource Strain: Low Risk  (09/16/2018)   Overall Financial Resource Strain (CARDIA)    Difficulty of Paying Living Expenses: Not hard at all  Food Insecurity: No Food Insecurity (09/16/2018)   Hunger Vital Sign    Worried About Running Out of Food in the Last Year: Never true    Ran Out of Food in the Last Year: Never true  Transportation Needs: No Transportation Needs (09/16/2018)   PRAPARE - Administrator, Civil Service (Medical): No    Lack of Transportation (Non-Medical): No  Physical Activity: Inactive (09/16/2018)   Exercise Vital Sign    Days of Exercise per Week: 0 days    Minutes of Exercise per Session: 0 min  Stress: Stress Concern Present (09/16/2018)   [employer] of Occupational Health - Occupational Stress Questionnaire    Feeling of Stress : To some extent  Social Connections: Unknown (09/16/2018)   Social Connection and Isolation Panel [NHANES]    Frequency of Communication with Friends and Family: Not on file    Frequency of Social Gatherings with Friends and Family: Not on file    Attends Religious Services: Never    Active Member of Clubs or Organizations: No    Attends Engineer, structural: Never    Marital Status: Separated    Allergies: No Known Allergies  Metabolic Disorder Labs: Lab Results  Component Value Date   HGBA1C 6.1 (H) 04/14/2023   MPG 128.37 02/19/2023   MPG 131.24 09/26/2020   Lab Results  Component Value Date   PROLACTIN 6.4 09/26/2020   Lab Results  Component Value Date   CHOL 161 04/14/2023   TRIG 160 (H) 04/14/2023   HDL 41 04/14/2023   CHOLHDL 3.9 04/14/2023    VLDL 20 09/26/2020   LDLCALC 92 04/14/2023   LDLCALC 103 (H) 02/20/2023   Lab Results  Component Value Date   TSH 0.883 04/14/2023   TSH 0.531 02/20/2023    Therapeutic Level Labs: No results found for: "LITHIUM" No results found for: "VALPROATE" No results found for: "CBMZ"  Current Medications: Current Outpatient Medications  Medication Sig Dispense Refill   acetaminophen (TYLENOL) 500 MG tablet Take 1,000 mg  by mouth daily.     albuterol (VENTOLIN HFA) 108 (90 Base) MCG/ACT inhaler Inhale 2 puffs into the lungs every 6 (six) hours as needed for wheezing or shortness of breath. 18 g 5   amLODipine-benazepril (LOTREL) 5-20 MG capsule Take 1 capsule by mouth daily. 90 capsule 1   busPIRone (BUSPAR) 30 MG tablet Take 1 tablet (30 mg total) by mouth 2 (two) times daily. 180 tablet 1   Crisaborole (EUCRISA) 2 % OINT Apply topically to affected areas 60 g 11   cyclobenzaprine (FLEXERIL) 10 MG tablet Take 1 tablet (10 mg total) by mouth 3 (three) times daily as needed for muscle spasms. 90 tablet 1   gabapentin (NEURONTIN) 800 MG tablet Take 1 tablet (800 mg total) by mouth 3 (three) times daily. 90 tablet 5   lidocaine (XYLOCAINE) 5 % ointment Apply 1 Application topically as needed for moderate pain or mild pain. (Patient taking differently: Apply 1 Application topically daily at 6 (six) AM. Knees, back, shoulders) 35.44 g 5   loratadine (CLARITIN) 10 MG tablet Take 1 tablet (10 mg total) by mouth daily as needed for allergies. 90 tablet 3   Melatonin 5 MG CAPS Take 1 capsule (5 mg total) by mouth at bedtime. (Patient taking differently: Take 10 mg by mouth at bedtime.)     meloxicam (MOBIC) 15 MG tablet TAKE ONE TABLET BY MOUTH EVERY DAY AS NEEDED 90 tablet 1   QUEtiapine (SEROQUEL) 25 MG tablet Take 1 tablet (25 mg total) by mouth at bedtime. 30 tablet 1   Semaglutide (RYBELSUS) 7 MG TABS Take 1 tablet (7 mg total) by mouth daily before breakfast. 30 tablet 1   simvastatin (ZOCOR) 20 MG  tablet TAKE ONE TABLET BY MOUTH AT BEDTIME 90 tablet 1   triamcinolone cream (KENALOG) 0.1 % Apply 1 Application topically 2 (two) times daily as needed (Rash). To bug bites until skin feels smooth. 60 g 11   venlafaxine XR (EFFEXOR-XR) 150 MG 24 hr capsule TAKE 1 CAPSULE BY MOUTH EVERY DAY WITH BREAKFAST ALONG WITH 75 MG (Patient taking differently: 150 mg daily with breakfast. Takes with 75 mg Effexor=225 mg) 90 capsule 1   venlafaxine XR (EFFEXOR-XR) 75 MG 24 hr capsule TAKE 1 CAPSULE BY MOUTH EVERY DAY ALONG WITH 150 MG (Patient taking differently: 75 mg daily with breakfast. Takes with 150 mg Effexor=225 mg) 90 capsule 1   Vibegron (GEMTESA) 75 MG TABS Take 1 tablet (75 mg total) by mouth daily. 30 tablet 5   traMADol (ULTRAM) 50 MG tablet Take 50 mg by mouth every 6 (six) hours as needed. (Patient not taking: Reported on 05/09/2023)     traZODone (DESYREL) 100 MG tablet Take 1 tablet (100 mg total) by mouth at bedtime as needed for sleep. HAS SUPPLIES - ON LOWER DOSE NOW     No current facility-administered medications for this visit.     Musculoskeletal: Strength & Muscle Tone:  UTA Gait & Station:  Seated Patient leans: N/A  Psychiatric Specialty Exam: Review of Systems  Psychiatric/Behavioral: Negative.      There were no vitals taken for this visit.There is no height or weight on file to calculate BMI.  General Appearance: Fairly Groomed  Eye Contact:  Good  Speech:  Clear and Coherent  Volume:  Normal  Mood:  Euthymic  Affect:  Appropriate  Thought Process:  Goal Directed and Descriptions of Associations: Intact  Orientation:  Full (Time, Place, and Person)  Thought Content: Logical   Suicidal  Thoughts:  No  Homicidal Thoughts:  No  Memory:  Immediate;   Fair Recent;   Fair Remote;   Fair  Judgement:  Fair  Insight:  Fair  Psychomotor Activity:  Normal  Concentration:  Concentration: Fair and Attention Span: Fair  Recall:  [employer] of Knowledge: Fair  Language:  Fair  Akathisia:  No  Handed:  Right  AIMS (if indicated): not done  Assets:  Communication Skills Desire for Improvement Housing Social Support  ADL's:  Intact  Cognition: WNL  Sleep:  Good   Screenings: Geneticist, molecular Office Visit from 01/07/2022 in [facility] Office Visit from 10/05/2021 in [facility] Office Visit from 11/27/2020 in [facility] Visit from 09/26/2020 in [facility]  AIMS Total Score 0 0 0 0      GAD-7    Flowsheet Row Office Visit from 03/10/2023 in [facility] Office Visit from 09[facility]10/2022 in [facility] Office Visit from 01/07/2022 in [facility] Office Visit from 08/14/2021 in [facility] Office Visit from 11/27/2020 in [facility]  Total GAD-7 Score 10 2 4  0 4      PHQ2-9    Flowsheet Row Office Visit from 03/10/2023 in [facility] Office Visit from 09/10/2022 in [facility] Office Visit from 01/07/2022 in [facility] Office Visit from 10/05/2021 in [facility] Office Visit from 05/07/2021 in [facility]  PHQ-2 Total Score 3 0 1 2 0  PHQ-9 Total Score 7 2 -- 4 --      Flowsheet Row Video Visit from 05/09/2023 in [facility] Office Visit from 03/10/2023 in [facility] Admission (Discharged) from 03/04/2023 in [facility]  C-SSRS RISK CATEGORY No Risk No Risk No Risk         Assessment and Plan: Lynix Glendening Sharrow is a 52 year old Caucasian female on SSI, history of bipolar disorder, tobacco use disorder was evaluated by telemedicine today.  Patient is currently improving on the current combination of medication, discussed assessment and plan as noted below.  Bipolar disorder most recent episode mixed in remission Currently reports mood symptoms as well managed on the current combination of medication, recent addition of Seroquel definitely helped. - Gabapentin 800 mg 4 times a day prescribed for pain - Seroquel 25 mg at bedtime  GAD/panic disorder-stable Currently well-managed on the combination of venlafaxine and BuSpar. -Continue venlafaxine extended release 225 mg daily -Continue BuSpar 30 mg twice daily.  Cannabis use disorder moderate-improving Patient continues to use cannabis. -Encourage abstinence.  Tobacco use disorder-unstable Patient currently not interested in smoking cessation. -Attempt counseling in future sessions again.  Insomnia-improving Currently sleep is improved on the current combination of Seroquel and lower dosage of trazodone. -Continue trazodone 100 mg at bedtime as needed -Continue Seroquel 25 mg at bedtime -Continue melatonin 10 mg as needed -Encourage sleep hygiene techniques. -Will continue to need sufficient pain management.  I have reviewed and discussed labs since patient is on Seroquel , Labs dated 04/14/2023-lipid panel within normal limits except for triglycerides elevated at 160, vitamin D within normal limits at 35.3 TSH-within normal limits at 0.8,  hemoglobin A1c-elevated at 6.1-currently under the care of primary care provider currently on medications Reviewed and discussed EKG-normal sinus rhythm, QTc 423-dated 02/19/2023.   Follow-up in clinic in 2 months or sooner if needed.  Consent: Patient/Guardian gives verbal consent for treatment and assignment of benefits for services provided during this visit.  Patient/Guardian expressed understanding and agreed to proceed.   This note was generated in part or whole with voice recognition software. Voice recognition is usually quite accurate but there are transcription errors that can and very often do occur. I apologize for any typographical errors that were not detected and corrected.    Jomarie Longs, MD 05/09/2023, 10:18 AM

## 2023-05-19 LAB — COLOGUARD: COLOGUARD: NEGATIVE

## 2023-05-22 ENCOUNTER — Telehealth: Payer: Self-pay

## 2023-05-22 ENCOUNTER — Other Ambulatory Visit: Payer: Self-pay | Admitting: Family

## 2023-05-22 NOTE — Telephone Encounter (Signed)
 Patient called stating that she has a Sore Throat, nasal congestion and is coughing up yellow phlem, just started over night she doesn't  want it to turn into a sinus infection per pt   [facility]

## 2023-05-26 ENCOUNTER — Telehealth: Payer: Self-pay | Admitting: Family

## 2023-05-26 MED ORDER — AZITHROMYCIN 250 MG PO TABS: ORAL_TABLET | ORAL | 0 refills | Status: DC

## 2023-05-26 NOTE — Telephone Encounter (Signed)
Taken care of by provider. 

## 2023-05-26 NOTE — Telephone Encounter (Signed)
 Per Marchelle Folks send in Z-Pak for sinus symptoms

## 2023-05-27 ENCOUNTER — Ambulatory Visit: Payer: Medicaid Other

## 2023-05-27 DIAGNOSIS — Z122 Encounter for screening for malignant neoplasm of respiratory organs: Secondary | ICD-10-CM

## 2023-05-29 NOTE — Progress Notes (Signed)
 Patient informed via mychart

## 2023-06-12 ENCOUNTER — Telehealth: Payer: Self-pay | Admitting: Family

## 2023-06-12 NOTE — Telephone Encounter (Signed)
Patient left VM wanting Marchelle Folks to increase her dosage on her "diet pill", states that she is "eating like crazy". I called patient to find out the name of the medication and she is referring to Rybelsus. Please advise.  [facility]

## 2023-06-13 ENCOUNTER — Other Ambulatory Visit: Payer: Self-pay

## 2023-06-16 ENCOUNTER — Other Ambulatory Visit: Payer: Self-pay | Admitting: Psychiatry

## 2023-06-16 DIAGNOSIS — G4709 Other insomnia: Secondary | ICD-10-CM

## 2023-06-16 MED ORDER — RYBELSUS 14 MG PO TABS: 14.0000 mg | ORAL_TABLET | Freq: Every day | ORAL | 0 refills | Status: DC

## 2023-07-07 ENCOUNTER — Telehealth (INDEPENDENT_AMBULATORY_CARE_PROVIDER_SITE_OTHER): Payer: Medicaid Other | Admitting: Psychiatry

## 2023-07-07 ENCOUNTER — Encounter: Payer: Self-pay | Admitting: Psychiatry

## 2023-07-07 DIAGNOSIS — F410 Panic disorder [episodic paroxysmal anxiety] without agoraphobia: Secondary | ICD-10-CM | POA: Diagnosis not present

## 2023-07-07 DIAGNOSIS — F17200 Nicotine dependence, unspecified, uncomplicated: Secondary | ICD-10-CM

## 2023-07-07 DIAGNOSIS — F3178 Bipolar disorder, in full remission, most recent episode mixed: Secondary | ICD-10-CM | POA: Diagnosis not present

## 2023-07-07 DIAGNOSIS — F411 Generalized anxiety disorder: Secondary | ICD-10-CM

## 2023-07-07 DIAGNOSIS — G4701 Insomnia due to medical condition: Secondary | ICD-10-CM

## 2023-07-07 DIAGNOSIS — F1220 Cannabis dependence, uncomplicated: Secondary | ICD-10-CM

## 2023-07-07 MED ORDER — QUETIAPINE FUMARATE 25 MG PO TABS: 25.0000 mg | ORAL_TABLET | Freq: Every day | ORAL | 1 refills | Status: DC

## 2023-07-07 NOTE — Progress Notes (Unsigned)
Virtual Visit via Video Note  I connected with Shelly Wright on 07/07/23 at  3:40 PM EST by a video enabled telemedicine application and verified that I am speaking with the correct person using two identifiers.  Location Provider Location : [facility] Patient Location : Car  Participants: Patient , Provider    I discussed the limitations of evaluation and management by telemedicine and the availability of in person appointments. The patient expressed understanding and agreed to proceed.    I discussed the assessment and treatment plan with the patient. The patient was provided an opportunity to ask questions and all were answered. The patient agreed with the plan and demonstrated an understanding of the instructions.   The patient was advised to call back or seek an in-person evaluation if the symptoms worsen or if the condition fails to improve as anticipated.    BH MD OP Progress Note  07/08/2023 9:22 AM Navneet Schmuck Tigges  MRN:  [id number]  Chief Complaint:  Chief Complaint  Patient presents with   Follow-up   Medication Refill   mood swings   Insomnia   HPI: Shelly Wright is a 53 year old Caucasian female, on SSI, has a history of bipolar disorder, GAD, panic disorder, cannabis use disorder, tobacco use disorder, married, lives in [place] was evaluated by telemedicine today.  She is experiencing significant stress due to her cousin's wife's illness, who has throat cancer and is awaiting hospice care. She has been assisting her cousin's family, who are raising two young children, aged six and seven, due to their parents' inability to care for them. She has been traveling to [place] weekly for the past three weeks to provide support. .She is not currently seeing a therapist and has no interest in therapy despite the stress.  She reports other than her current anxiety about her situational stressors but overall she has been managing okay.  Denies any significant manic or hypomanic  episodes.  Denies any significant depression symptoms.  Reports current medications as managing her mood symptoms.  She is compliant on her medications like venlafaxine, Seroquel, BuSpar, trazodone, denies side effects.  Patient denies any suicidality, homicidality or perceptual disturbances.  Patient denies any other concerns today.  Visit Diagnosis:    ICD-10-CM   1. Bipolar disorder, in full remission, most recent episode mixed (HCC)  F31.78     2. GAD (generalized anxiety disorder)  F41.1     3. Panic disorder  F41.0     4. Insomnia due to medical condition  G47.01    multifactorial inclduing pain, mood    5. Cannabis use disorder, moderate, dependence (HCC)  F12.20     6. Tobacco use disorder  F17.200 QUEtiapine (SEROQUEL) 25 MG tablet      Past Psychiatric History: I have reviewed past psychiatric history from progress note on 09/16/2018.  Past trials of venlafaxine, trazodone, Seroquel, hydroxyzine, Zoloft, Lexapro, duloxetine, Paxil, Abilify, Wellbutrin.  Past Medical History:  Past Medical History:  Diagnosis Date   Abnormal EKG    Acute right-sided low back pain with right-sided sciatica 08/30/2014   Anxiety    Arthritis    Asthma    in past, no current inhalers   Bipolar disorder (HCC)    Breathlessness on exertion 04/20/2014   Cannabis use disorder    Cervical spinal cord compression (HCC) 05/07/2012   Chronic pain syndrome    COPD (chronic obstructive pulmonary disease) (HCC)    DDD (degenerative disc disease), lumbar    Depression  DOE (dyspnea on exertion)    Dysrhythmia    irregular-h/o in the past   Extreme obesity    Fibromyalgia    H/O syncope 04/25/2014   High risk medication use 06/23/2019   Hyperlipidemia    Hypertension    Hypokalemia    Hyponatremia 05/07/2021   Long term current use of anticoagulant therapy (Lovenox) 05/06/2016   Lumbar canal stenosis    Marijuana smoker    Neuritis or radiculitis due to rupture of lumbar  intervertebral disc 05/23/2014   Noncompliance with treatment regimen 05/01/2020   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   a.) presurgical PCR (+) 02/02/2019 prior to LEFT TKA; b.) presurgical PCR (+) 03/06/2019 prior to RIGHT REVERSE SHOULDER/BICEPS TENODESIS; c.) presurgical PCR (+) 01/17/2022 prior to LEFT SHOULDER ARTHROSCOPY/OPEN ROTATOR CUFF REPAIR/SUBACROMIAL DECOMPRESSION/DISTAL CLAVICLE EXCISION/OPEN BICEPS TENODESIS   Partner relational problem 10/05/2021   Periprosthetic fracture around prosthetic knee 07/31/2015   Personal history of perinatal problems 04/25/2014   Pharmacologic therapy 11/30/2019   Pre-diabetes    Restless leg syndrome    Soft tissue lesion of shoulder region 01/24/2012   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   Syncope and collapse 04/08/2014   last was 2023   Thoracic and lumbosacral neuritis 05/07/2012   Thoracic neuritis 05/07/2012   Tobacco use disorder    Vitamin D deficiency     Past Surgical History:  Procedure Laterality Date   ABDOMINAL HYSTERECTOMY     BICEPT TENODESIS Left 03/04/2023   Procedure: Left reverse shoulder arthroplasty, biceps tenodesis; open distal clavicle excision and AC joint cyst excision;  Surgeon: Signa Kell, MD;  Location: [facility];  Service: Orthopedics;  Laterality: Left;   Cervical Fusion X 2     JOINT REPLACEMENT Right 01/2016   Dr Gavin Potters   KNEE ARTHROPLASTY Left 02/10/2019   Procedure: COMPUTER ASSISTED TOTAL KNEE ARTHROPLASTY - RNFA;  Surgeon: Donato Heinz, MD;  Location: [facility];  Service: Orthopedics;  Laterality: Left;   KNEE ARTHROSCOPY Bilateral    Right knee scope 1998, Left knee scope   RESECTION DISTAL CLAVICAL Left 03/04/2023   Procedure: Left reverse shoulder arthroplasty, biceps tenodesis; open distal clavicle excision and AC joint cyst excision;  Surgeon: Signa Kell, MD;  Location: [facility];  Service: Orthopedics;   Laterality: Left;   REVERSE SHOULDER ARTHROPLASTY Right 03/12/2021   Procedure: Right reverse shoulder arthroplasty, biceps tenodesis;  Surgeon: Signa Kell, MD;  Location: [facility];  Service: Orthopedics;  Laterality: Right;   REVERSE SHOULDER ARTHROPLASTY Left 03/04/2023   Procedure: Left reverse shoulder arthroplasty, biceps tenodesis; open distal clavicle excision and AC joint cyst excision;  Surgeon: Signa Kell, MD;  Location: [facility];  Service: Orthopedics;  Laterality: Left;   SHOULDER ARTHROSCOPY WITH SUBACROMIAL DECOMPRESSION AND OPEN ROTATOR C Left 06/18/2021   Procedure: Left shoulder arthroscopy, Open rotator cuff repair, subacromial decompression, distal clavicle excision and, open biceps tenodesis;  Surgeon: Signa Kell, MD;  Location: [facility];  Service: Orthopedics;  Laterality: Left;   TONSILLECTOMY     TOTAL KNEE ARTHROPLASTY Right 02/01/2015   Procedure: TOTAL KNEE ARTHROPLASTY;  Surgeon: Erin Sons, MD;  Location: [facility];  Service: Orthopedics;  Laterality: Right;   TUBAL LIGATION      Family Psychiatric History: I have reviewed family psychiatric history from progress note on 09/16/2018.  Family History:  Family History  Problem Relation Age of Onset   Breast cancer Mother 44   Colon cancer  Father    Heart attack Father    Bipolar disorder Sister    Depression Sister    Schizophrenia Sister     Social History: I have reviewed social history from progress note on 09/16/2018. Social History   Socioeconomic History   Marital status: Married    Spouse name: Molly Maduro   Number of children: 1   Years of education: Not on file   Highest education level: High school graduate  Occupational History   Not on file  Tobacco Use   Smoking status: Every Day    Current packs/day: 0.00    Average packs/day: 1 pack/day for 30.0 years (30.0 ttl pk-yrs)    Types: Cigarettes    Start date: 09/12/1991    Last attempt to quit: 09/11/2021    Years since quitting: 1.8    Smokeless tobacco: Never  Vaping Use   Vaping status: Never Used  Substance and Sexual Activity   Alcohol use: No   Drug use: Yes    Types: Marijuana    Comment: every day   Sexual activity: Not Currently  Other Topics Concern   Not on file  Social History Narrative   Not on file   Social Drivers of Health   Financial Resource Strain: Low Risk  (09/16/2018)   Overall Financial Resource Strain (CARDIA)    Difficulty of Paying Living Expenses: Not hard at all  Food Insecurity: No Food Insecurity (09/16/2018)   Hunger Vital Sign    Worried About Running Out of Food in the Last Year: Never true    Ran Out of Food in the Last Year: Never true  Transportation Needs: No Transportation Needs (09/16/2018)   PRAPARE - Administrator, Civil Service (Medical): No    Lack of Transportation (Non-Medical): No  Physical Activity: Inactive (09/16/2018)   Exercise Vital Sign    Days of Exercise per Week: 0 days    Minutes of Exercise per Session: 0 min  Stress: Stress Concern Present (09/16/2018)   [employer] of Occupational Health - Occupational Stress Questionnaire    Feeling of Stress : To some extent  Social Connections: Unknown (09/16/2018)   Social Connection and Isolation Panel [NHANES]    Frequency of Communication with Friends and Family: Not on file    Frequency of Social Gatherings with Friends and Family: Not on file    Attends Religious Services: Never    Active Member of Clubs or Organizations: No    Attends Engineer, structural: Never    Marital Status: Separated    Allergies: No Known Allergies  Metabolic Disorder Labs: Lab Results  Component Value Date   HGBA1C 6.1 (H) 04/14/2023   MPG 128.37 02/19/2023   MPG 131.24 09/26/2020   Lab Results  Component Value Date   PROLACTIN 6.4 09/26/2020   Lab Results  Component Value Date   CHOL 161 04/14/2023   TRIG 160 (H) 04/14/2023   HDL 41 04/14/2023   CHOLHDL 3.9 04/14/2023   VLDL 20  09/26/2020   LDLCALC 92 04/14/2023   LDLCALC 103 (H) 02/20/2023   Lab Results  Component Value Date   TSH 0.883 04/14/2023   TSH 0.531 02/20/2023    Therapeutic Level Labs: No results found for: "LITHIUM" No results found for: "VALPROATE" No results found for: "CBMZ"  Current Medications: Current Outpatient Medications  Medication Sig Dispense Refill   acetaminophen (TYLENOL) 500 MG tablet Take 1,000 mg by mouth daily.     albuterol (VENTOLIN HFA) 108 (90  Base) MCG/ACT inhaler Inhale 2 puffs into the lungs every 6 (six) hours as needed for wheezing or shortness of breath. 18 g 5   amLODipine-benazepril (LOTREL) 5-20 MG capsule Take 1 capsule by mouth daily. 90 capsule 1   azithromycin (ZITHROMAX Z-PAK) 250 MG tablet Take 2 tablets (500 mg) on  Day 1,  followed by 1 tablet (250 mg) once daily on Days 2 through 5. 6 each 0   busPIRone (BUSPAR) 30 MG tablet Take 1 tablet (30 mg total) by mouth 2 (two) times daily. 180 tablet 1   Crisaborole (EUCRISA) 2 % OINT Apply topically to affected areas 60 g 11   cyclobenzaprine (FLEXERIL) 10 MG tablet Take 1 tablet (10 mg total) by mouth 3 (three) times daily as needed for muscle spasms. 90 tablet 1   gabapentin (NEURONTIN) 800 MG tablet Take 1 tablet (800 mg total) by mouth 3 (three) times daily. 90 tablet 5   lidocaine (XYLOCAINE) 5 % ointment Apply 1 Application topically as needed for moderate pain or mild pain. (Patient taking differently: Apply 1 Application topically daily at 6 (six) AM. Knees, back, shoulders) 35.44 g 5   loratadine (CLARITIN) 10 MG tablet Take 1 tablet (10 mg total) by mouth daily as needed for allergies. 90 tablet 3   Melatonin 5 MG CAPS Take 1 capsule (5 mg total) by mouth at bedtime. (Patient taking differently: Take 10 mg by mouth at bedtime.)     meloxicam (MOBIC) 15 MG tablet TAKE ONE TABLET BY MOUTH EVERY DAY AS NEEDED 90 tablet 1   QUEtiapine (SEROQUEL) 25 MG tablet Take 1 tablet (25 mg total) by mouth at bedtime.  90 tablet 1   Semaglutide (RYBELSUS) 14 MG TABS Take 1 tablet (14 mg total) by mouth daily. 90 tablet 0   simvastatin (ZOCOR) 20 MG tablet TAKE ONE TABLET BY MOUTH AT BEDTIME 90 tablet 1   traMADol (ULTRAM) 50 MG tablet Take 50 mg by mouth every 6 (six) hours as needed. (Patient not taking: Reported on 05/09/2023)     traZODone (DESYREL) 100 MG tablet Take 1 tablet (100 mg total) by mouth at bedtime as needed for sleep. HAS SUPPLIES - ON LOWER DOSE NOW     triamcinolone cream (KENALOG) 0.1 % Apply 1 Application topically 2 (two) times daily as needed (Rash). To bug bites until skin feels smooth. 60 g 11   venlafaxine XR (EFFEXOR-XR) 150 MG 24 hr capsule TAKE 1 CAPSULE BY MOUTH EVERY DAY WITH BREAKFAST ALONG WITH 75 MG (Patient taking differently: 150 mg daily with breakfast. Takes with 75 mg Effexor=225 mg) 90 capsule 1   venlafaxine XR (EFFEXOR-XR) 75 MG 24 hr capsule TAKE 1 CAPSULE BY MOUTH EVERY DAY ALONG WITH 150 MG (Patient taking differently: 75 mg daily with breakfast. Takes with 150 mg Effexor=225 mg) 90 capsule 1   Vibegron (GEMTESA) 75 MG TABS Take 1 tablet (75 mg total) by mouth daily. 30 tablet 5   No current facility-administered medications for this visit.     Musculoskeletal: Strength & Muscle Tone:  UTA Gait & Station:  Seated Patient leans: N/A  Psychiatric Specialty Exam: Review of Systems  Psychiatric/Behavioral:  The patient is nervous/anxious.     There were no vitals taken for this visit.There is no height or weight on file to calculate BMI.  General Appearance: Casual  Eye Contact:  Fair  Speech:  Clear and Coherent  Volume:  Normal  Mood:  Anxious  Affect:  Congruent  Thought Process:  Goal Directed and Descriptions of Associations: Intact  Orientation:  Full (Time, Place, and Person)  Thought Content: Logical   Suicidal Thoughts:  No  Homicidal Thoughts:  No  Memory:  Immediate;   Fair Recent;   Fair Remote;   Fair  Judgement:  Fair  Insight:  Fair   Psychomotor Activity:  Normal  Concentration:  Concentration: Fair and Attention Span: Fair  Recall:  [employer] of Knowledge: Fair  Language: Fair  Akathisia:  No  Handed:  Right  AIMS (if indicated): not done  Assets:  Desire for Improvement Housing Social Support  ADL's:  Intact  Cognition: WNL  Sleep:  Fair   Screenings: Midwife Visit from 01/07/2022 in [facility] Office Visit from 10/05/2021 in [facility] Office Visit from 11/27/2020 in [facility] Office Visit from 09/26/2020 in [facility]  AIMS Total Score 0 0 0 0      GAD-7    Flowsheet Row Office Visit from 03/10/2023 in [facility] Office Visit from 09/10/2022 in [facility] Office Visit from 01/07/2022 in [facility] Office Visit from 08/14/2021 in [facility] Office Visit from 11/27/2020 in [facility]  Total GAD-7 Score 10 2 4  0 4      PHQ2-9    Flowsheet Row Office Visit from 03/10/2023 in [facility] Office Visit from 09/10/2022 in [facility] Office Visit from 01/07/2022 in [facility] Office Visit from 10/05/2021 in [facility] Office Visit from 05/07/2021 in [facility]  PHQ-2 Total Score 3 0 1 2 0  PHQ-9 Total Score 7 2 -- 4 --      Flowsheet Row Video Visit from 07/07/2023 in [facility] Video Visit from 05/09/2023 in [facility] Office Visit from  03/10/2023 in [facility]  C-SSRS RISK CATEGORY No Risk No Risk No Risk        Assessment and Plan: Mekisha Bittel Clinch IS A 53 year old Caucasian female on SSI, history of bipolar disorder, tobacco use disorder was evaluated by telemedicine today.  Patient with current situational stressors although managing well, discussed assessment and plan as noted below.  Bipolar disorder most recent episode mixed in remission Currently although does have situational stressors managing mood symptoms well on the current medication regimen. - Continue Gabapentin 800 mg 4 times a day prescribed for pain - Continue Seroquel 25 mg at bedtime.  Generalized anxiety disorder/Panic disorder-stable Does report current situational stressors which does trigger her anxiety although managing well on the current medication regimen.  Discussed referral for CBT patient declines. - Continue Venlafaxine extended release 225 mg daily - Continue BuSpar 30 mg p.o. twice daily - Discussed with patient to let this provider know if she is interested in referral for CBT.  Cannabis use disorder moderate-improving No changes were reported at this visit. - We will reevaluate in future sessions.  Tobacco use disorder-unstable Patient not interested in smoking cessation. - Reevaluate in future sessions.  Insomnia-improving Currently denies any significant sleep issues.  Well-managed on medication regimen. - Continue Trazodone 100 mg at bedtime as needed - Continue Melatonin 10 mg  as needed - Continue sleep hygiene techniques. - She will also need sufficient pain management.   Schedule follow-up video visit on [address] at 10 AM  Collaboration of Care: Collaboration of Care: Patient refused AEB patient declined referral for CBT.  Patient/Guardian was advised Release of Information must be obtained prior to any record release in order to collaborate their care with an outside provider.  Patient/Guardian was advised if they have not already done so to contact the registration department to sign all necessary forms in order for [place] to release information regarding their care.   Consent: Patient/Guardian gives verbal consent for treatment and assignment of benefits for services provided during this visit. Patient/Guardian expressed understanding and agreed to proceed.   This note was generated in part or whole with voice recognition software. Voice recognition is usually quite accurate but there are transcription errors that can and very often do occur. I apologize for any typographical errors that were not detected and corrected.    Jomarie Longs, MD 07/08/2023, 9:22 AM

## 2023-07-18 ENCOUNTER — Telehealth: Payer: Self-pay | Admitting: Family

## 2023-07-18 NOTE — Telephone Encounter (Signed)
 Patient left VM wanting something different called in for weight loss because what she is currently on is not working. Please advise, she is currently on Rybelsus.

## 2023-07-21 ENCOUNTER — Other Ambulatory Visit: Payer: Self-pay | Admitting: Family

## 2023-07-21 ENCOUNTER — Telehealth (HOSPITAL_COMMUNITY): Payer: Self-pay

## 2023-07-21 DIAGNOSIS — F410 Panic disorder [episodic paroxysmal anxiety] without agoraphobia: Secondary | ICD-10-CM

## 2023-07-21 DIAGNOSIS — F411 Generalized anxiety disorder: Secondary | ICD-10-CM

## 2023-07-21 MED ORDER — VENLAFAXINE HCL ER 150 MG PO CP24
150.0000 mg | ORAL_CAPSULE | Freq: Every day | ORAL | 1 refills | Status: DC
Start: 2023-07-21 — End: 2024-02-18

## 2023-07-21 MED ORDER — VENLAFAXINE HCL ER 75 MG PO CP24
75.0000 mg | ORAL_CAPSULE | Freq: Every day | ORAL | 1 refills | Status: DC
Start: 2023-07-21 — End: 2024-01-22

## 2023-07-21 NOTE — Telephone Encounter (Signed)
 pt notified that rxs have been sent to the pharmacy

## 2023-07-21 NOTE — Telephone Encounter (Signed)
 I have sent venlafaxine 150 mg and 75 mg to pharmacy.

## 2023-07-21 NOTE — Telephone Encounter (Signed)
 received fax requesting a refill on the venlafaxine 75mg  and the 150mg . pt was last seen on 2-17 next appt 5-13

## 2023-07-31 ENCOUNTER — Telehealth: Payer: Self-pay

## 2023-07-31 NOTE — Telephone Encounter (Signed)
 Pt LM asking for call back about getting alternative Weight Loss shot called in

## 2023-08-01 NOTE — Telephone Encounter (Signed)
 Patient can discuss with Marchelle Folks at her visit next week

## 2023-08-07 NOTE — Telephone Encounter (Signed)
 Patient left another VM wanting something different sent in for weight loss and mentioned shots. Please advise.

## 2023-08-12 ENCOUNTER — Encounter: Payer: Self-pay | Admitting: Family

## 2023-08-12 ENCOUNTER — Ambulatory Visit: Payer: Medicaid Other | Admitting: Family

## 2023-08-12 VITALS — BP 128/78 | HR 86 | Ht 66.0 in | Wt 242.2 lb

## 2023-08-12 DIAGNOSIS — M48062 Spinal stenosis, lumbar region with neurogenic claudication: Secondary | ICD-10-CM

## 2023-08-12 DIAGNOSIS — E559 Vitamin D deficiency, unspecified: Secondary | ICD-10-CM

## 2023-08-12 DIAGNOSIS — I10 Essential (primary) hypertension: Secondary | ICD-10-CM | POA: Diagnosis not present

## 2023-08-12 DIAGNOSIS — M47816 Spondylosis without myelopathy or radiculopathy, lumbar region: Secondary | ICD-10-CM

## 2023-08-12 DIAGNOSIS — R5383 Other fatigue: Secondary | ICD-10-CM

## 2023-08-12 DIAGNOSIS — E538 Deficiency of other specified B group vitamins: Secondary | ICD-10-CM

## 2023-08-12 DIAGNOSIS — E782 Mixed hyperlipidemia: Secondary | ICD-10-CM

## 2023-08-12 DIAGNOSIS — M51362 Other intervertebral disc degeneration, lumbar region with discogenic back pain and lower extremity pain: Secondary | ICD-10-CM | POA: Diagnosis not present

## 2023-08-12 DIAGNOSIS — R7303 Prediabetes: Secondary | ICD-10-CM

## 2023-08-12 NOTE — Progress Notes (Signed)
 Established Patient Office Visit  Subjective:  Patient ID: Shelly Wright, female    DOB: 01-Aug-1970  Age: 53 y.o. MRN: [id number]  Chief Complaint  Patient presents with   Follow-up    4 month follow up    Patient is here today for her 3 months follow up.  She has been feeling fairly well since last appointment.   She does have additional concerns to discuss today.  Having additional issues with her sciatica again.  She has seen ortho in the past for this, but asks if we can see someone else as she has been  Labs are not due today. She needs refills.   I have reviewed her active problem list, medication list, allergies, health maintenance, notes from last encounter, lab results for her appointment today.    No other concerns at this time.   Past Medical History:  Diagnosis Date   Abnormal EKG    Acute right-sided low back pain with right-sided sciatica 08/30/2014   Anxiety    Arthritis    Asthma    in past, no current inhalers   Bipolar disorder (HCC)    Breathlessness on exertion 04/20/2014   Cannabis use disorder    Cervical spinal cord compression (HCC) 05/07/2012   Chronic pain syndrome    COPD (chronic obstructive pulmonary disease) (HCC)    DDD (degenerative disc disease), lumbar    Depression    DOE (dyspnea on exertion)    Dysrhythmia    irregular-h/o in the past   Extreme obesity    Fibromyalgia    H/O syncope 04/25/2014   High risk medication use 06/23/2019   Hyperlipidemia    Hypertension    Hypokalemia    Hyponatremia 05/07/2021   Long term current use of anticoagulant therapy (Lovenox) 05/06/2016   Lumbar canal stenosis    Marijuana smoker    Neuritis or radiculitis due to rupture of lumbar intervertebral disc 05/23/2014   Noncompliance with treatment regimen 05/01/2020   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   a.) presurgical PCR (+) 02/02/2019 prior to LEFT TKA; b.) presurgical PCR (+) 03/06/2019 prior to RIGHT REVERSE SHOULDER/BICEPS  TENODESIS; c.) presurgical PCR (+) 01/17/2022 prior to LEFT SHOULDER ARTHROSCOPY/OPEN ROTATOR CUFF REPAIR/SUBACROMIAL DECOMPRESSION/DISTAL CLAVICLE EXCISION/OPEN BICEPS TENODESIS   Partner relational problem 10/05/2021   Periprosthetic fracture around prosthetic knee 07/31/2015   Personal history of perinatal problems 04/25/2014   Pharmacologic therapy 11/30/2019   Pre-diabetes    Restless leg syndrome    Soft tissue lesion of shoulder region 01/24/2012   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   Syncope and collapse 04/08/2014   last was 2023   Thoracic and lumbosacral neuritis 05/07/2012   Thoracic neuritis 05/07/2012   Tobacco use disorder    Vitamin D deficiency     Past Surgical History:  Procedure Laterality Date   ABDOMINAL HYSTERECTOMY     BICEPT TENODESIS Left 03/04/2023   Procedure: Left reverse shoulder arthroplasty, biceps tenodesis; open distal clavicle excision and AC joint cyst excision;  Surgeon: Signa Kell, MD;  Location: [facility];  Service: Orthopedics;  Laterality: Left;   Cervical Fusion X 2     JOINT REPLACEMENT Right 01/2016   Dr Gavin Potters   KNEE ARTHROPLASTY Left 02/10/2019   Procedure: COMPUTER ASSISTED TOTAL KNEE ARTHROPLASTY - RNFA;  Surgeon: Donato Heinz, MD;  Location: [facility];  Service: Orthopedics;  Laterality: Left;   KNEE ARTHROSCOPY Bilateral  Right knee scope 1998, Left knee scope   RESECTION DISTAL CLAVICAL Left 03/04/2023   Procedure: Left reverse shoulder arthroplasty, biceps tenodesis; open distal clavicle excision and AC joint cyst excision;  Surgeon: Signa Kell, MD;  Location: [facility];  Service: Orthopedics;  Laterality: Left;   REVERSE SHOULDER ARTHROPLASTY Right 03/12/2021   Procedure: Right reverse shoulder arthroplasty, biceps tenodesis;  Surgeon: Signa Kell, MD;  Location: [facility];  Service: Orthopedics;  Laterality: Right;   REVERSE SHOULDER  ARTHROPLASTY Left 03/04/2023   Procedure: Left reverse shoulder arthroplasty, biceps tenodesis; open distal clavicle excision and AC joint cyst excision;  Surgeon: Signa Kell, MD;  Location: [facility];  Service: Orthopedics;  Laterality: Left;   SHOULDER ARTHROSCOPY WITH SUBACROMIAL DECOMPRESSION AND OPEN ROTATOR C Left 06/18/2021   Procedure: Left shoulder arthroscopy, Open rotator cuff repair, subacromial decompression, distal clavicle excision and, open biceps tenodesis;  Surgeon: Signa Kell, MD;  Location: [facility];  Service: Orthopedics;  Laterality: Left;   TONSILLECTOMY     TOTAL KNEE ARTHROPLASTY Right 02/01/2015   Procedure: TOTAL KNEE ARTHROPLASTY;  Surgeon: Erin Sons, MD;  Location: [facility];  Service: Orthopedics;  Laterality: Right;   TUBAL LIGATION      Social History   Socioeconomic History   Marital status: Married    Spouse name: Molly Maduro   Number of children: 1   Years of education: Not on file   Highest education level: High school graduate  Occupational History   Not on file  Tobacco Use   Smoking status: Every Day    Current packs/day: 0.00    Average packs/day: 1 pack/day for 30.0 years (30.0 ttl pk-yrs)    Types: Cigarettes    Start date: 09/12/1991    Last attempt to quit: 09/11/2021    Years since quitting: 1.9   Smokeless tobacco: Never  Vaping Use   Vaping status: Never Used  Substance and Sexual Activity   Alcohol use: No   Drug use: Yes    Types: Marijuana    Comment: every day   Sexual activity: Not Currently  Other Topics Concern   Not on file  Social History Narrative   Not on file   Social Drivers of Health   Financial Resource Strain: Low Risk  (09/16/2018)   Overall Financial Resource Strain (CARDIA)    Difficulty of Paying Living Expenses: Not hard at all  Food Insecurity: No Food Insecurity (09/16/2018)   Hunger Vital Sign    Worried About Running Out of Food in the Last Year: Never true    Ran Out of Food in the Last Year: Never  true  Transportation Needs: No Transportation Needs (09/16/2018)   PRAPARE - Administrator, Civil Service (Medical): No    Lack of Transportation (Non-Medical): No  Physical Activity: Inactive (09/16/2018)   Exercise Vital Sign    Days of Exercise per Week: 0 days    Minutes of Exercise per Session: 0 min  Stress: Stress Concern Present (09/16/2018)   [employer] of Occupational Health - Occupational Stress Questionnaire    Feeling of Stress : To some extent  Social Connections: Unknown (09/16/2018)   Social Connection and Isolation Panel [NHANES]    Frequency of Communication with Friends and Family: Not on file    Frequency of Social Gatherings with Friends and Family: Not on file    Attends Religious Services: Never    Active Member of Clubs or Organizations: No    Attends Banker Meetings: Never  Marital Status: Separated  Intimate Partner Violence: Not At Risk (09/16/2018)   Humiliation, Afraid, Rape, and Kick questionnaire    Fear of Current or Ex-Partner: No    Emotionally Abused: No    Physically Abused: No    Sexually Abused: No    Family History  Problem Relation Age of Onset   Breast cancer Mother 76   Colon cancer Father    Heart attack Father    Bipolar disorder Sister    Depression Sister    Schizophrenia Sister     No Known Allergies  Review of Systems  Musculoskeletal:  Positive for back pain.  All other systems reviewed and are negative.      Objective:   BP 128/78   Pulse 86   Ht 5\' 6"  (1.676 m)   Wt 242 lb 3.2 oz (109.9 kg)   SpO2 95%   BMI 39.09 kg/m   Vitals:   08/12/23 0903  BP: 128/78  Pulse: 86  Height: 5\' 6"  (1.676 m)  Weight: 242 lb 3.2 oz (109.9 kg)  SpO2: 95%  BMI (Calculated): 39.11    Physical Exam Vitals and nursing note reviewed.  Constitutional:      Appearance: Normal appearance. She is obese.  HENT:     Head: Normocephalic.     Right Ear: Tympanic membrane normal.     Left Ear:  Tympanic membrane normal.     Nose: Nose normal.  Eyes:     Extraocular Movements: Extraocular movements intact.     Conjunctiva/sclera: Conjunctivae normal.     Pupils: Pupils are equal, round, and reactive to light.  Cardiovascular:     Rate and Rhythm: Normal rate and regular rhythm.  Pulmonary:     Effort: Pulmonary effort is normal.     Breath sounds: Normal breath sounds.  Musculoskeletal:        General: Normal range of motion.     Cervical back: Normal range of motion.  Neurological:     General: No focal deficit present.     Mental Status: She is alert and oriented to person, place, and time. Mental status is at baseline.  Psychiatric:        Mood and Affect: Mood normal.        Behavior: Behavior normal.        Thought Content: Thought content normal.        Judgment: Judgment normal.      No results found for any visits on 08/12/23.  No results found for this or any previous visit (from the past 2160 hours).     Assessment & Plan:   Problem List Items Addressed This Visit       Cardiovascular and Mediastinum   Essential hypertension, benign   Blood pressure well controlled with current medications.  Continue current therapy.  Will reassess at follow up.        Relevant Orders   CMP14+EGFR   CBC with Differential/Platelet     Musculoskeletal and Integument   DDD (degenerative disc disease), lumbar (Chronic)   Sending referral to [facility] for patient.  Will defer to them for further treatment decisions      Relevant Orders   CMP14+EGFR   CBC with Differential/Platelet   Ambulatory referral to Neurosurgery     Other   Lumbar canal stenosis (L3-4 and L4-5) (Chronic)   Sending referral to [facility] for patient.  Will defer to them for further treatment decisions      Relevant Orders   CMP14+EGFR  CBC with Differential/Platelet   Ambulatory referral to Neurosurgery   Vitamin D deficiency, unspecified   Checking labs today.  Will continue supplements as  needed.        Relevant Orders   VITAMIN D 25 Hydroxy (Vit-D Deficiency, Fractures)   CMP14+EGFR   CBC with Differential/Platelet   Hyperlipidemia   Checking labs today.  Continue current therapy for lipid control. Will modify as needed based on labwork results.        Relevant Orders   Lipid panel   CMP14+EGFR   CBC with Differential/Platelet   Lumbar facet syndrome (Bilateral) (L>R) - Primary (Chronic)   Sending referral to [facility] for patient.  Will defer to them for further treatment decisions      Relevant Orders   CMP14+EGFR   CBC with Differential/Platelet   Ambulatory referral to Neurosurgery   Prediabetes   A1C Continues to be in prediabetic ranges.  Will reassess at follow up after next lab check.  Patient counseled on dietary choices and verbalized understanding.  Patient educated on foods that contain carbohydrates and the need to decrease intake.  We discussed prediabetes, and what it means and the need for strict dietary control to prevent progression to type 2 diabetes.  Advised to decrease intake of sugary drinks, including sodas, sweet tea, and some juices, and of starch and sugar heavy foods (ie., potatoes, rice, bread, pasta, desserts). She verbalizes understanding and agreement with the changes discussed today.        Relevant Orders   CMP14+EGFR   Hemoglobin A1c   CBC with Differential/Platelet   Other Visit Diagnoses       B12 deficiency due to diet       Relevant Orders   CMP14+EGFR   Vitamin B12   CBC with Differential/Platelet     Other fatigue       Relevant Orders   CMP14+EGFR   TSH   CBC with Differential/Platelet       Return in about 4 months (around 12/12/2023) for F/U.   Total time spent: 20 minutes  Miki Kins, FNP  08/12/2023   This document [place] have been prepared by [facility] Voice Recognition software and as such [employer] include unintentional dictation errors.

## 2023-08-12 NOTE — Assessment & Plan Note (Signed)
 Blood pressure well controlled with current medications.  Continue current therapy.  Will reassess at follow up.

## 2023-08-12 NOTE — Assessment & Plan Note (Signed)
 Sending referral to [facility] for patient.  Will defer to them for further treatment decisions

## 2023-08-12 NOTE — Assessment & Plan Note (Addendum)
 Sending referral to [facility] for patient.  Will defer to them for further treatment decisions

## 2023-08-12 NOTE — Progress Notes (Unsigned)
 Referring Physician:  Miki Kins, FNP [address] [place],  [place] [address]  Primary Physician:  Miki Kins, FNP  History of Present Illness: 08/14/2023 [name] is here today with a chief complaint of back pain that extends down both of her legs to her mid calf area.  This is associated with numbness and tingling.  She states that she is having a very difficult time walking and when she ambulates her legs ache quite severely.  The only way to help with her pain is for her to lean forward.  She states at this point she is unable to cook and clean secondary to her pain.  She had that this has been ongoing for over a decade, but has become substantially worse over the past year.  She denies any saddle anesthesia or true weakness.  She smokes at least a pack a day.   Bowel/Bladder Dysfunction: none  Conservative measures:  Physical therapy:  has not participated in PT recently Multimodal medical therapy including regular antiinflammatories:  Tylenol, Flexeril, Gabapentin, Trazadone, Meloxicam, Tramadol Injections: no epidural steroid injections  Past Surgery: cervical fusion X2  08/14/16: bilateral L2, L3, L4, L5, & S1 diagnostic medial branch facet block by Dr Laban Emperor 02/08/14: left-sided RFA of L3,4,5 at [facility] 02/01/14: R L3, 4, 5 RFA at [facility] 01/19/14: Bilateral L3, 4, 5 Medial Branch Nerve Blocks at [facility] 01/05/14: Bilateral L3, 4, 5 Medial Branch Nerve Blocks at [facility] Shelly Wright has no symptoms of cervical myelopathy.  The symptoms are causing a significant impact on the patient's life.   Review of Systems:  A 10 point review of systems is negative, except for the pertinent positives and negatives detailed in the HPI.  Past Medical History: Past Medical History:  Diagnosis Date   Abnormal EKG    Acute right-sided low back pain with right-sided sciatica 08/30/2014   Anxiety    Arthritis    Asthma    in past, no current inhalers   Bipolar disorder (HCC)     Breathlessness on exertion 04/20/2014   Cannabis use disorder    Cervical spinal cord compression (HCC) 05/07/2012   Chronic pain syndrome    COPD (chronic obstructive pulmonary disease) (HCC)    DDD (degenerative disc disease), lumbar    Depression    DOE (dyspnea on exertion)    Dysrhythmia    irregular-h/o in the past   Extreme obesity    Fibromyalgia    H/O syncope 04/25/2014   High risk medication use 06/23/2019   Hyperlipidemia    Hypertension    Hypokalemia    Hyponatremia 05/07/2021   Long term current use of anticoagulant therapy (Lovenox) 05/06/2016   Lumbar canal stenosis    Marijuana smoker    Neuritis or radiculitis due to rupture of lumbar intervertebral disc 05/23/2014   Noncompliance with treatment regimen 05/01/2020   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   a.) presurgical PCR (+) 02/02/2019 prior to LEFT TKA; b.) presurgical PCR (+) 03/06/2019 prior to RIGHT REVERSE SHOULDER/BICEPS TENODESIS; c.) presurgical PCR (+) 01/17/2022 prior to LEFT SHOULDER ARTHROSCOPY/OPEN ROTATOR CUFF REPAIR/SUBACROMIAL DECOMPRESSION/DISTAL CLAVICLE EXCISION/OPEN BICEPS TENODESIS   Partner relational problem 10/05/2021   Periprosthetic fracture around prosthetic knee 07/31/2015   Personal history of perinatal problems 04/25/2014   Pharmacologic therapy 11/30/2019   Pre-diabetes    Restless leg syndrome    Soft tissue lesion of shoulder region 01/24/2012   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   Syncope and collapse 04/08/2014   last was 2023   Thoracic and lumbosacral neuritis 05/07/2012   Thoracic neuritis 05/07/2012   Tobacco use disorder    Vitamin Shelly deficiency     Past Surgical History: Past Surgical History:  Procedure Laterality Date   ABDOMINAL HYSTERECTOMY     BICEPT TENODESIS Left 03/04/2023   Procedure: Left reverse shoulder arthroplasty, biceps tenodesis; open distal clavicle excision and  AC joint cyst excision;  Surgeon: Signa Kell, MD;  Location: [facility];  Service: Orthopedics;  Laterality: Left;   Cervical Fusion X 2     JOINT REPLACEMENT Right 01/2016   Dr Gavin Potters   KNEE ARTHROPLASTY Left 02/10/2019   Procedure: COMPUTER ASSISTED TOTAL KNEE ARTHROPLASTY - RNFA;  Surgeon: Donato Heinz, MD;  Location: [facility];  Service: Orthopedics;  Laterality: Left;   KNEE ARTHROSCOPY Bilateral    Right knee scope 1998, Left knee scope   RESECTION DISTAL CLAVICAL Left 03/04/2023   Procedure: Left reverse shoulder arthroplasty, biceps tenodesis; open distal clavicle excision and AC joint cyst excision;  Surgeon: Signa Kell, MD;  Location: [facility];  Service: Orthopedics;  Laterality: Left;   REVERSE SHOULDER ARTHROPLASTY Right 03/12/2021   Procedure: Right reverse shoulder arthroplasty, biceps tenodesis;  Surgeon: Signa Kell, MD;  Location: [facility];  Service: Orthopedics;  Laterality: Right;   REVERSE SHOULDER ARTHROPLASTY Left 03/04/2023   Procedure: Left reverse shoulder arthroplasty, biceps tenodesis; open distal clavicle excision and AC joint cyst excision;  Surgeon: Signa Kell, MD;  Location: [facility];  Service: Orthopedics;  Laterality: Left;   SHOULDER ARTHROSCOPY WITH SUBACROMIAL DECOMPRESSION AND OPEN ROTATOR C Left 06/18/2021   Procedure: Left shoulder arthroscopy, Open rotator cuff repair, subacromial decompression, distal clavicle excision and, open biceps tenodesis;  Surgeon: Signa Kell, MD;  Location: [facility];  Service: Orthopedics;  Laterality: Left;   TONSILLECTOMY     TOTAL KNEE ARTHROPLASTY Right 02/01/2015   Procedure: TOTAL KNEE ARTHROPLASTY;  Surgeon: Erin Sons, MD;  Location: [facility];  Service: Orthopedics;  Laterality: Right;   TUBAL LIGATION      Allergies: Allergies as of 08/14/2023   (No Known Allergies)    Medications: Outpatient Encounter Medications as of 08/14/2023  Medication Sig   acetaminophen (TYLENOL) 500 MG tablet Take 1,000 mg  by mouth daily.   albuterol (VENTOLIN HFA) 108 (90 Base) MCG/ACT inhaler Inhale 2 puffs into the lungs every 6 (six) hours as needed for wheezing or shortness of breath.   amLODipine-benazepril (LOTREL) 5-20 MG capsule Take 1 capsule by mouth daily.   busPIRone (BUSPAR) 30 MG tablet Take 1 tablet (30 mg total) by mouth 2 (two) times daily.   Crisaborole (EUCRISA) 2 % OINT Apply topically to affected areas   cyclobenzaprine (FLEXERIL) 10 MG tablet TAKE 1 TABLET BY MOUTH THREE TIMES DAILYAS NEEDED FOR MUSCLE SPASMS   gabapentin (NEURONTIN) 800 MG tablet Take 1 tablet (800 mg total) by mouth 3 (three) times daily.   lidocaine (XYLOCAINE) 5 % ointment Apply 1 Application topically as needed for moderate pain or mild pain. (Patient taking differently: Apply 1 Application topically daily at 6 (six) AM. Knees, back, shoulders)   loratadine (CLARITIN) 10 MG tablet Take 1 tablet (10 mg total) by mouth daily as needed for allergies.   Melatonin 5 MG CAPS Take 1 capsule (5 mg total) by mouth at bedtime. (Patient taking differently: Take 10 mg by mouth at bedtime.)   meloxicam (MOBIC) 15 MG tablet TAKE  ONE TABLET BY MOUTH EVERY DAY AS NEEDED   QUEtiapine (SEROQUEL) 25 MG tablet Take 1 tablet (25 mg total) by mouth at bedtime.   simvastatin (ZOCOR) 20 MG tablet TAKE ONE TABLET BY MOUTH AT BEDTIME   traMADol (ULTRAM) 50 MG tablet Take 50 mg by mouth every 6 (six) hours as needed.   traZODone (DESYREL) 100 MG tablet Take 1 tablet (100 mg total) by mouth at bedtime as needed for sleep. HAS SUPPLIES - ON LOWER DOSE NOW   triamcinolone cream (KENALOG) 0.1 % Apply 1 Application topically 2 (two) times daily as needed (Rash). To bug bites until skin feels smooth.   venlafaxine XR (EFFEXOR-XR) 150 MG 24 hr capsule Take 1 capsule (150 mg total) by mouth daily with breakfast. TAKE ALONG WITH 75 MG   venlafaxine XR (EFFEXOR-XR) 75 MG 24 hr capsule Take 1 capsule (75 mg total) by mouth daily with breakfast. TAKE ALONG WITH  150 MG   Vibegron (GEMTESA) 75 MG TABS Take 1 tablet (75 mg total) by mouth daily.   [DISCONTINUED] azithromycin (ZITHROMAX Z-PAK) 250 MG tablet Take 2 tablets (500 mg) on  Day 1,  followed by 1 tablet (250 mg) once daily on Days 2 through 5. (Patient not taking: Reported on 08/12/2023)   No facility-administered encounter medications on file as of 08/14/2023.    Social History: Social History   Tobacco Use   Smoking status: Every Day    Current packs/day: 0.00    Average packs/day: 1 pack/day for 30.0 years (30.0 ttl pk-yrs)    Types: Cigarettes    Start date: 09/12/1991    Last attempt to quit: 09/11/2021    Years since quitting: 1.9   Smokeless tobacco: Never  Vaping Use   Vaping status: Never Used  Substance Use Topics   Alcohol use: No   Drug use: Yes    Types: Marijuana    Comment: every day    Family Medical History: Family History  Problem Relation Age of Onset   Breast cancer Mother 73   Colon cancer Father    Heart attack Father    Bipolar disorder Sister    Depression Sister    Schizophrenia Sister     Physical Examination: @VITALWITHPAIN @  General: Patient is well developed, well nourished, calm, collected, and in no apparent distress. Attention to examination is appropriate.  Psychiatric: Patient is non-anxious.  Head:  Pupils equal, round, and reactive to light.  ENT:  Oral mucosa appears well hydrated.  Neck:   Supple.  Full range of motion.  Respiratory: Patient is breathing without any difficulty.  Extremities: No edema.  Vascular: Palpable dorsal pedal pulses.  Skin:   On exposed skin, there are no abnormal skin lesions.  NEUROLOGICAL:     Awake, alert, oriented to person, place, and time.  Speech is clear and fluent. Fund of knowledge is appropriate.   Cranial Nerves: Pupils equal round and reactive to light.  Facial tone is symmetric.   ROM of spine: Some mild tenderness to palpation of her lumbar spine.  Strength:  Side Iliopsoas  Quads Hamstring PF DF EHL  R 5 5 5 5 5 5   L 5 5 5 5 5 5    Reflexes are 1+ to bilateral patella.  Absent in the Achilles. Clonus is not present.  Toes are down-going.  Bilateral upper and lower extremity sensation is intact to light touch.    Patient ambulates with a cane  Medical Decision Making  Imaging: Imaging: MRI L spine  12/25/2020  FINDINGS:  Marrow edema along L2-L3 endplates and in the L4 vertebral body around a large Schmorl's node.   The visualized cord is unremarkable and the conus medullaris ends at a normal level.   Minimal retrolisthesis of L3 on L4 and anterolisthesis of L4 on L5, unchanged.  Multilevel disc desiccation with loss of disc height, most prominent at L3-L4 and L4-L5.   Incompletely evaluated disc herniation at T11-T12 causing moderate left neural foraminal narrowing. Diffuse disc bulge at T12-L1.   L1-L2: Diffuse disc bulge with broad based posterior protrusion causes mild to moderate spinal canal narrowing along with bilateral facet arthropathy and ligamentum flavum arthropathy leading to mild bilateral neural foraminal narrowing, new since 2015.   L2-L3: Diffuse disc bulge, asymmetric to the right effaces the anterior thecal sac contacting the right-sided descending nerve roots along with ligamentum flavum hypertrophy and facet arthropathy causes severe spinal canal narrowing. There is severe right neural foraminal and lateral recess narrowing leading to impingement of right L2 and traversing right L3 nerves. Left neural foramen is patent.   L3-L4: Asymmetric right disc bulge along with facet arthropathy causes severe right neural foraminal impinging the exiting right L3 nerve. Ligamentum flavum hypertrophy with superimposed disc bulge causes severe spinal canal narrowing at this level. Moderate left neural foraminal narrowing.   L4-L5: Diffuse disc bulge, ligamentum flavum hypertrophy and facet arthropathy causes severe spinal canal and bilateral neural  foraminal narrowing, left worse than right with impingement of bilateral exiting L4 nerves. Small volume effusion and hypertrophic degeneration of bilateral facet joints.   L5-S1: The disc is normal. Ligamentum flavum hypertrophy and facet arthropathy causes moderate left neural foraminal narrowing. Right neural foramen is patent.   The paraspinal tissues are within normal limits.   For the purposes of this dictation, the lowest well formed intervertebral disc space is assumed to be the L5-S1 level, and there are presumed to be five lumbar-type vertebral bodies.   I have personally reviewed the images and agree with the above interpretation.  Assessment and Plan: [name] is a pleasant 53 y.o. female is here today with a chief complaint of back pain that extends down both of her legs to her mid calf area.  This is associated with numbness and tingling.  She states that she is having a very difficult time walking and when she ambulates her legs ache quite severely.  The only way to help with her pain is for her to lean forward.  She states at this point she is unable to cook and clean secondary to her pain.  She had that this has been ongoing for over a decade, but has become substantially worse over the past year.  She denies any saddle anesthesia or true weakness.  On examination she has some mild tenderness to palpation of her lumbar paraspinals.  She has full strength of bilateral lower extremities.  MRI completed 3 years ago was reviewed as well and we discussed severe foraminal stenosis extending from L2-5.  She was previously recommended for L2-5 lateral lumbar interbody fusion and percutaneous screw fixation.  It was previously recommended that she needs to weigh 225 pounds or less and to stop smoking prior to undergoing this procedure.  Unfortunately patient continues to smoke and be above this recommended weight.  She states that she is on a new weight loss medication to help with this.  She  adds that she is not ready to stop smoking at this time.  She was counseled on this  at length.  To start, referral placed for physical therapy.  We will have updated x-rays completed today.  I will review once complete.  MRI of her lumbar spine was also ordered.  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  This was discussed at length.  She does likely need surgery in the future however the importance of smoking cessation and weight loss were reiterated with her again.  Will again try conservative measures to see if it helps her pain.  Thank you for involving me in the care of this patient.   I spent a total of 45 minutes in both face-to-face and non-face-to-face activities for this visit on the date of this encounter including reviewing outside notes and images, obtaining detailed HPI, examination, placing referral and ordering new test, counseling patient on medical condition and smoking cessation.  Joan Flores, PA-C Dept. of Neurosurgery

## 2023-08-12 NOTE — Assessment & Plan Note (Signed)
 Checking labs today.  Will continue supplements as needed.

## 2023-08-12 NOTE — Assessment & Plan Note (Signed)
 A1C Continues to be in prediabetic ranges.  Will reassess at follow up after next lab check.  Patient counseled on dietary choices and verbalized understanding.  Patient educated on foods that contain carbohydrates and the need to decrease intake.  We discussed prediabetes, and what it means and the need for strict dietary control to prevent progression to type 2 diabetes.  Advised to decrease intake of sugary drinks, including sodas, sweet tea, and some juices, and of starch and sugar heavy foods (ie., potatoes, rice, bread, pasta, desserts). She verbalizes understanding and agreement with the changes discussed today.

## 2023-08-12 NOTE — Assessment & Plan Note (Signed)
 Checking labs today.  Continue current therapy for lipid control. Will modify as needed based on labwork results.

## 2023-08-13 LAB — VITAMIN D 25 HYDROXY (VIT D DEFICIENCY, FRACTURES): Vit D, 25-Hydroxy: 37.4 ng/mL (ref 30.0–100.0)

## 2023-08-13 LAB — CMP14+EGFR
ALT: 27 IU/L (ref 0–32)
AST: 24 IU/L (ref 0–40)
Albumin: 4.6 g/dL (ref 3.8–4.9)
Alkaline Phosphatase: 134 IU/L — ABNORMAL HIGH (ref 44–121)
BUN/Creatinine Ratio: 12 (ref 9–23)
BUN: 14 mg/dL (ref 6–24)
Bilirubin Total: 0.3 mg/dL (ref 0.0–1.2)
CO2: 25 mmol/L (ref 20–29)
Calcium: 10.1 mg/dL (ref 8.7–10.2)
Chloride: 100 mmol/L (ref 96–106)
Creatinine, Ser: 1.15 mg/dL — ABNORMAL HIGH (ref 0.57–1.00)
Globulin, Total: 2.3 g/dL (ref 1.5–4.5)
Glucose: 101 mg/dL — ABNORMAL HIGH (ref 70–99)
Potassium: 5 mmol/L (ref 3.5–5.2)
Sodium: 140 mmol/L (ref 134–144)
Total Protein: 6.9 g/dL (ref 6.0–8.5)
eGFR: 57 mL/min/{1.73_m2} — ABNORMAL LOW (ref >59)

## 2023-08-13 LAB — LIPID PANEL
Chol/HDL Ratio: 4 ratio (ref 0.0–4.4)
Cholesterol, Total: 168 mg/dL (ref 100–199)
HDL: 42 mg/dL (ref >39)
LDL Chol Calc (NIH): 101 mg/dL — ABNORMAL HIGH (ref 0–99)
Triglycerides: 141 mg/dL (ref 0–149)
VLDL Cholesterol Cal: 25 mg/dL (ref 5–40)

## 2023-08-13 LAB — TSH: TSH: 0.602 u[IU]/mL (ref 0.450–4.500)

## 2023-08-13 LAB — HEMOGLOBIN A1C
Est. average glucose Bld gHb Est-mCnc: 131 mg/dL
Hgb A1c MFr Bld: 6.2 % — ABNORMAL HIGH (ref 4.8–5.6)

## 2023-08-13 LAB — VITAMIN B12: Vitamin B-12: 557 pg/mL (ref 232–1245)

## 2023-08-14 ENCOUNTER — Encounter: Payer: Self-pay | Admitting: Physician Assistant

## 2023-08-14 ENCOUNTER — Telehealth: Payer: Self-pay

## 2023-08-14 ENCOUNTER — Ambulatory Visit
Admission: RE | Admit: 2023-08-14 | Discharge: 2023-08-14 | Disposition: A | Discharge status: Home / Self Care | Source: Ambulatory Visit | Attending: Physician Assistant | Admitting: Physician Assistant

## 2023-08-14 ENCOUNTER — Other Ambulatory Visit: Payer: Self-pay

## 2023-08-14 ENCOUNTER — Ambulatory Visit
Admission: RE | Admit: 2023-08-14 | Discharge: 2023-08-14 | Disposition: A | Discharge status: Home / Self Care | Attending: Physician Assistant | Admitting: Physician Assistant

## 2023-08-14 ENCOUNTER — Ambulatory Visit: Admitting: Physician Assistant

## 2023-08-14 VITALS — BP 120/76 | Ht 66.0 in | Wt 240.0 lb

## 2023-08-14 DIAGNOSIS — G8929 Other chronic pain: Secondary | ICD-10-CM

## 2023-08-14 DIAGNOSIS — M48062 Spinal stenosis, lumbar region with neurogenic claudication: Secondary | ICD-10-CM | POA: Diagnosis not present

## 2023-08-14 DIAGNOSIS — M5441 Lumbago with sciatica, right side: Secondary | ICD-10-CM | POA: Diagnosis not present

## 2023-08-14 DIAGNOSIS — M5442 Lumbago with sciatica, left side: Secondary | ICD-10-CM | POA: Insufficient documentation

## 2023-08-14 NOTE — Telephone Encounter (Signed)
 Spoke with patient and got the refill request sent to provider

## 2023-08-14 NOTE — Telephone Encounter (Signed)
 Patient LM asking for call back she states she didn't get her refills from her visit yesterday..  Called patient and LM asking her to call back so we know what refills to send to her PCP

## 2023-08-15 ENCOUNTER — Telehealth: Payer: Self-pay | Admitting: Family

## 2023-08-15 MED ORDER — TRAMADOL HCL 50 MG PO TABS: 50.0000 mg | ORAL_TABLET | Freq: Four times a day (QID) | ORAL | 0 refills | Status: DC | PRN

## 2023-08-15 MED ORDER — WEGOVY 0.5 MG/0.5ML ~~LOC~~ SOAJ: 0.5000 mg | SUBCUTANEOUS | 0 refills | Status: DC

## 2023-08-15 NOTE — Telephone Encounter (Signed)
 Patient left VM asking if her pain in her legs could be coming from loss of circulation? Please advise.

## 2023-08-16 ENCOUNTER — Other Ambulatory Visit: Payer: Self-pay | Admitting: Psychiatry

## 2023-08-16 DIAGNOSIS — F411 Generalized anxiety disorder: Secondary | ICD-10-CM

## 2023-08-16 DIAGNOSIS — F410 Panic disorder [episodic paroxysmal anxiety] without agoraphobia: Secondary | ICD-10-CM

## 2023-08-18 ENCOUNTER — Telehealth: Payer: Self-pay

## 2023-08-18 NOTE — Telephone Encounter (Signed)
 pt states that sleeping medication is not working she states that she wakes up and then can not go back to sleep. pt was last seen on 2-17 next appt 5-13

## 2023-08-19 ENCOUNTER — Telehealth: Payer: Self-pay | Admitting: Family

## 2023-08-19 NOTE — Telephone Encounter (Signed)
 pt states that she was taking 200mg  of trazodone but now it is 100mg . she states that she still taking the melatonin , gabapentin and the seroquel and those did not change.

## 2023-08-19 NOTE — Telephone Encounter (Signed)
 Patient left a VM that she needs a PA for her weight loss injection.   I will get this completed this afternoon.

## 2023-08-20 ENCOUNTER — Telehealth: Payer: Self-pay | Admitting: Psychiatry

## 2023-08-20 NOTE — Telephone Encounter (Signed)
 Please contact patient for a sooner appointment

## 2023-08-20 NOTE — Telephone Encounter (Signed)
 Pt.notified

## 2023-08-20 NOTE — Telephone Encounter (Signed)
 Will have staff call back patient for a sooner appointment since patient contacted the clinic requiring medication changes.

## 2023-08-26 ENCOUNTER — Other Ambulatory Visit: Payer: Self-pay

## 2023-08-26 DIAGNOSIS — I739 Peripheral vascular disease, unspecified: Secondary | ICD-10-CM

## 2023-08-27 ENCOUNTER — Telehealth: Admitting: Psychiatry

## 2023-08-27 ENCOUNTER — Encounter: Payer: Self-pay | Admitting: Psychiatry

## 2023-08-27 DIAGNOSIS — F17210 Nicotine dependence, cigarettes, uncomplicated: Secondary | ICD-10-CM

## 2023-08-27 DIAGNOSIS — F411 Generalized anxiety disorder: Secondary | ICD-10-CM

## 2023-08-27 DIAGNOSIS — G4701 Insomnia due to medical condition: Secondary | ICD-10-CM | POA: Diagnosis not present

## 2023-08-27 DIAGNOSIS — F17200 Nicotine dependence, unspecified, uncomplicated: Secondary | ICD-10-CM

## 2023-08-27 DIAGNOSIS — F3178 Bipolar disorder, in full remission, most recent episode mixed: Secondary | ICD-10-CM | POA: Diagnosis not present

## 2023-08-27 DIAGNOSIS — F410 Panic disorder [episodic paroxysmal anxiety] without agoraphobia: Secondary | ICD-10-CM | POA: Diagnosis not present

## 2023-08-27 DIAGNOSIS — F1220 Cannabis dependence, uncomplicated: Secondary | ICD-10-CM

## 2023-08-27 MED ORDER — TRAZODONE HCL 100 MG PO TABS
200.0000 mg | ORAL_TABLET | Freq: Every evening | ORAL | 1 refills | Status: DC | PRN
Start: 2023-08-27 — End: 2023-09-19

## 2023-08-27 NOTE — Progress Notes (Signed)
 Virtual Visit via Video Note  I connected with Shelly Wright on 08/27/23 at 11:30 AM EDT by a video enabled telemedicine application and verified that I am speaking with the correct person using two identifiers.  Location Provider Location : [facility] Patient Location : Home  Participants: Patient , Provider   I discussed the limitations of evaluation and management by telemedicine and the availability of in person appointments. The patient expressed understanding and agreed to proceed.   I discussed the assessment and treatment plan with the patient. The patient was provided an opportunity to ask questions and all were answered. The patient agreed with the plan and demonstrated an understanding of the instructions.   The patient was advised to call back or seek an in-person evaluation if the symptoms worsen or if the condition fails to improve as anticipated.  BH MD OP Progress Note  08/27/2023 1:03 PM [name] Real  MRN:  [id number]  Chief Complaint:  Chief Complaint  Patient presents with   Follow-up   Anxiety   Depression   Medication Refill    Discussed the use of AI scribe software for clinical note transcription with the patient, who gave verbal consent to proceed.  History of Present Illness Shelly Wright is a 53 year old Caucasian female on SSI, has a history of bipolar disorder, GAD, panic disorder, cannabis use disorder, tobacco use disorder, married, lives in [place] was evaluated by telemedicine today.  She has experienced improved sleep since resuming two trazodone  tablets at bedtime. She goes to bed around 9 or 10 PM, wakes up briefly around 3 or 4 AM, and then returns to sleep until morning. No drowsiness or grogginess the next day, and she occasionally takes naps, particularly when fatigued from activities, such as after a busy weekend.  She is currently taking several medications including gabapentin  800 mg three times a day, Seroquel , venlafaxine , Buspar  30 mg twice  a day, melatonin 10 mg, she is also on Wegovy  for weight loss.  She notes a recent weight loss from 244-245 lbs to 239 lbs since starting Wegovy .  No suicidal ideation, thoughts of harming herself or others, and no psychotic symptoms such as hearing voices.  She continues to smoke despite previous discussions about quitting.  Recent lab results were reviewed with the patient including vitamin B-12-within normal limits, hemoglobin A1c of 6.2-elevated, TSH-within normal limits, lipid panel with LDL elevated at 101 otherwise within normal limits, vitamin D -within normal limits, creatinine elevated at 1.15 and GFR of 57.  Patient to continue to follow up with primary care provider for abnormal labs.    Visit Diagnosis:    ICD-10-CM   1. Bipolar disorder, in full remission, most recent episode mixed (HCC)  F31.78 traZODone  (DESYREL ) 100 MG tablet    2. GAD (generalized anxiety disorder)  F41.1 traZODone  (DESYREL ) 100 MG tablet    3. Panic disorder  F41.0     4. Insomnia due to medical condition  G47.01    Multifactorial including pain, mood symptoms    5. Cannabis use disorder, moderate, dependence (HCC)  F12.20     6. Tobacco use disorder  F17.200       Past Psychiatric History: I have reviewed past psychiatric history from progress note on 09/16/2018.  Past trials of venlafaxine , trazodone , Seroquel , hydroxyzine , Zoloft, Lexapro, duloxetine, Paxil, Abilify , Wellbutrin.  Past Medical History:  Past Medical History:  Diagnosis Date   Abnormal EKG    Acute right-sided low back pain with right-sided sciatica 08/30/2014  Anxiety    Arthritis    Asthma    in past, no current inhalers   Bipolar disorder (HCC)    Breathlessness on exertion 04/20/2014   Cannabis use disorder    Cervical spinal cord compression (HCC) 05/07/2012   Chronic pain syndrome    COPD (chronic obstructive pulmonary disease) (HCC)    DDD (degenerative disc disease), lumbar    Depression    DOE (dyspnea on  exertion)    Dysrhythmia    irregular-h/o in the past   Extreme obesity    Fibromyalgia    H/O syncope 04/25/2014   High risk medication use 06/23/2019   Hyperlipidemia    Hypertension    Hypokalemia    Hyponatremia 05/07/2021   Long term current use of anticoagulant therapy (Lovenox ) 05/06/2016   Lumbar canal stenosis    Marijuana smoker    Neuritis or radiculitis due to rupture of lumbar intervertebral disc 05/23/2014   Noncompliance with treatment regimen 05/01/2020   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   a.) presurgical PCR (+) 02/02/2019 prior to LEFT TKA; b.) presurgical PCR (+) 03/06/2019 prior to RIGHT REVERSE SHOULDER/BICEPS TENODESIS; c.) presurgical PCR (+) 01/17/2022 prior to LEFT SHOULDER ARTHROSCOPY/OPEN ROTATOR CUFF REPAIR/SUBACROMIAL DECOMPRESSION/DISTAL CLAVICLE EXCISION/OPEN BICEPS TENODESIS   Partner relational problem 10/05/2021   Periprosthetic fracture around prosthetic knee 07/31/2015   Personal history of perinatal problems 04/25/2014   Pharmacologic therapy 11/30/2019   Pre-diabetes    Restless leg syndrome    Soft tissue lesion of shoulder region 01/24/2012   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   Syncope and collapse 04/08/2014   last was 2023   Thoracic and lumbosacral neuritis 05/07/2012   Thoracic neuritis 05/07/2012   Tobacco use disorder    Vitamin D  deficiency     Past Surgical History:  Procedure Laterality Date   ABDOMINAL HYSTERECTOMY     BICEPT TENODESIS Left 03/04/2023   Procedure: Left reverse shoulder arthroplasty, biceps tenodesis; open distal clavicle excision and AC joint cyst excision;  Surgeon: Tobie Priest, MD;  Location: [facility];  Service: Orthopedics;  Laterality: Left;   Cervical Fusion X 2     JOINT REPLACEMENT Right 01/2016   Dr Maryl   KNEE ARTHROPLASTY Left 02/10/2019   Procedure: COMPUTER ASSISTED TOTAL KNEE ARTHROPLASTY - RNFA;  Surgeon:  Mardee Lynwood SQUIBB, MD;  Location: [facility];  Service: Orthopedics;  Laterality: Left;   KNEE ARTHROSCOPY Bilateral    Right knee scope 1998, Left knee scope   RESECTION DISTAL CLAVICAL Left 03/04/2023   Procedure: Left reverse shoulder arthroplasty, biceps tenodesis; open distal clavicle excision and AC joint cyst excision;  Surgeon: Tobie Priest, MD;  Location: [facility];  Service: Orthopedics;  Laterality: Left;   REVERSE SHOULDER ARTHROPLASTY Right 03/12/2021   Procedure: Right reverse shoulder arthroplasty, biceps tenodesis;  Surgeon: Tobie Priest, MD;  Location: [facility];  Service: Orthopedics;  Laterality: Right;   REVERSE SHOULDER ARTHROPLASTY Left 03/04/2023   Procedure: Left reverse shoulder arthroplasty, biceps tenodesis; open distal clavicle excision and AC joint cyst excision;  Surgeon: Tobie Priest, MD;  Location: [facility];  Service: Orthopedics;  Laterality: Left;   SHOULDER ARTHROSCOPY WITH SUBACROMIAL DECOMPRESSION AND OPEN ROTATOR C Left 06/18/2021   Procedure: Left shoulder arthroscopy, Open rotator cuff repair, subacromial decompression, distal clavicle excision and, open biceps tenodesis;  Surgeon: Tobie Priest, MD;  Location: [facility];  Service: Orthopedics;  Laterality: Left;   TONSILLECTOMY  TOTAL KNEE ARTHROPLASTY Right 02/01/2015   Procedure: TOTAL KNEE ARTHROPLASTY;  Surgeon: Helayne Glenn, MD;  Location: [facility];  Service: Orthopedics;  Laterality: Right;   TUBAL LIGATION      Family Psychiatric History: I have reviewed family psychiatric history from progress note on 09/16/2018.  Family History:  Family History  Problem Relation Age of Onset   Breast cancer Mother 21   Colon cancer Father    Heart attack Father    Bipolar disorder Sister    Depression Sister    Schizophrenia Sister     Social History: I have reviewed social history from progress note on 09/16/2018. Social History   Socioeconomic History   Marital status: Married    Spouse name: Lamar    Number of children: 1   Years of education: Not on file   Highest education level: High school graduate  Occupational History   Not on file  Tobacco Use   Smoking status: Every Day    Current packs/day: 0.00    Average packs/day: 1 pack/day for 30.0 years (30.0 ttl pk-yrs)    Types: Cigarettes    Start date: 09/12/1991    Last attempt to quit: 09/11/2021    Years since quitting: 1.9   Smokeless tobacco: Never  Vaping Use   Vaping status: Never Used  Substance and Sexual Activity   Alcohol use: No   Drug use: Yes    Types: Marijuana    Comment: every day   Sexual activity: Not Currently  Other Topics Concern   Not on file  Social History Narrative   Not on file   Social Drivers of Health   Financial Resource Strain: Low Risk  (09/16/2018)   Overall Financial Resource Strain (CARDIA)    Difficulty of Paying Living Expenses: Not hard at all  Food Insecurity: No Food Insecurity (09/16/2018)   Hunger Vital Sign    Worried About Running Out of Food in the Last Year: Never true    Ran Out of Food in the Last Year: Never true  Transportation Needs: No Transportation Needs (09/16/2018)   PRAPARE - Administrator, Civil Service (Medical): No    Lack of Transportation (Non-Medical): No  Physical Activity: Inactive (09/16/2018)   Exercise Vital Sign    Days of Exercise per Week: 0 days    Minutes of Exercise per Session: 0 min  Stress: Stress Concern Present (09/16/2018)   [employer] of Occupational Health - Occupational Stress Questionnaire    Feeling of Stress : To some extent  Social Connections: Unknown (09/16/2018)   Social Connection and Isolation Panel [NHANES]    Frequency of Communication with Friends and Family: Not on file    Frequency of Social Gatherings with Friends and Family: Not on file    Attends Religious Services: Never    Active Member of Clubs or Organizations: No    Attends Banker Meetings: Never    Marital Status: Separated     Allergies: No Known Allergies  Metabolic Disorder Labs: Lab Results  Component Value Date   HGBA1C 6.2 (H) 08/12/2023   MPG 128.37 02/19/2023   MPG 131.24 09/26/2020   Lab Results  Component Value Date   PROLACTIN 6.4 09/26/2020   Lab Results  Component Value Date   CHOL 168 08/12/2023   TRIG 141 08/12/2023   HDL 42 08/12/2023   CHOLHDL 4.0 08/12/2023   VLDL 20 09/26/2020   LDLCALC 101 (H) 08/12/2023   LDLCALC 92 04/14/2023  Lab Results  Component Value Date   TSH 0.602 08/12/2023   TSH 0.883 04/14/2023    Therapeutic Level Labs: No results found for: LITHIUM No results found for: VALPROATE No results found for: CBMZ  Current Medications: Current Outpatient Medications  Medication Sig Dispense Refill   acetaminophen  (TYLENOL ) 500 MG tablet Take 1,000 mg by mouth daily.     albuterol  (VENTOLIN  HFA) 108 (90 Base) MCG/ACT inhaler Inhale 2 puffs into the lungs every 6 (six) hours as needed for wheezing or shortness of breath. 18 g 5   amLODipine -benazepril  (LOTREL) 5-20 MG capsule Take 1 capsule by mouth daily. 90 capsule 1   busPIRone  (BUSPAR ) 30 MG tablet TAKE 1 TABLET BY MOUTH TWICE DAILY 180 tablet 1   Crisaborole  (EUCRISA ) 2 % OINT Apply topically to affected areas 60 g 11   cyclobenzaprine  (FLEXERIL ) 10 MG tablet TAKE 1 TABLET BY MOUTH THREE TIMES DAILYAS NEEDED FOR MUSCLE SPASMS 90 tablet 1   gabapentin  (NEURONTIN ) 800 MG tablet Take 1 tablet (800 mg total) by mouth 3 (three) times daily. 90 tablet 5   lidocaine  (XYLOCAINE ) 5 % ointment Apply 1 Application topically as needed for moderate pain or mild pain. (Patient taking differently: Apply 1 Application topically daily at 6 (six) AM. Knees, back, shoulders) 35.44 g 5   loratadine  (CLARITIN ) 10 MG tablet Take 1 tablet (10 mg total) by mouth daily as needed for allergies. 90 tablet 3   Melatonin 5 MG CAPS Take 1 capsule (5 mg total) by mouth at bedtime. (Patient taking differently: Take 10 mg by mouth at  bedtime.)     meloxicam  (MOBIC ) 15 MG tablet TAKE ONE TABLET BY MOUTH EVERY DAY AS NEEDED 90 tablet 1   QUEtiapine  (SEROQUEL ) 25 MG tablet Take 1 tablet (25 mg total) by mouth at bedtime. 90 tablet 1   Semaglutide -Weight Management (WEGOVY ) 0.5 MG/0.5ML SOAJ Inject 0.5 mg into the skin once a week. 2 mL 0   simvastatin  (ZOCOR ) 20 MG tablet TAKE ONE TABLET BY MOUTH AT BEDTIME 90 tablet 1   traMADol  (ULTRAM ) 50 MG tablet Take 1 tablet (50 mg total) by mouth every 6 (six) hours as needed. 120 tablet 0   venlafaxine  XR (EFFEXOR -XR) 150 MG 24 hr capsule Take 1 capsule (150 mg total) by mouth daily with breakfast. TAKE ALONG WITH 75 MG 90 capsule 1   venlafaxine  XR (EFFEXOR -XR) 75 MG 24 hr capsule Take 1 capsule (75 mg total) by mouth daily with breakfast. TAKE ALONG WITH 150 MG 90 capsule 1   Vibegron  (GEMTESA ) 75 MG TABS Take 1 tablet (75 mg total) by mouth daily. 30 tablet 5   traZODone  (DESYREL ) 100 MG tablet Take 2 tablets (200 mg total) by mouth at bedtime as needed for sleep. HAS SUPPLIES 180 tablet 1   No current facility-administered medications for this visit.     Musculoskeletal: Strength & Muscle Tone:  UTA Gait & Station:  Seated Patient leans: N/A  Psychiatric Specialty Exam: Review of Systems  Psychiatric/Behavioral:  The patient is nervous/anxious.     There were no vitals taken for this visit.There is no height or weight on file to calculate BMI.  General Appearance: Casual  Eye Contact:  Fair  Speech:  Normal Rate  Volume:  Normal  Mood:  Anxious coping well  Affect:  Congruent  Thought Process:  Goal Directed and Descriptions of Associations: Intact  Orientation:  Full (Time, Place, and Person)  Thought Content: Logical   Suicidal Thoughts:  No  Homicidal Thoughts:  No  Memory:  Immediate;   Fair Recent;   Fair Remote;   Fair  Judgement:  Fair  Insight:  Fair  Psychomotor Activity:  Normal  Concentration:  Concentration: Fair and Attention Span: Fair  Recall:   [employer] of Knowledge: Fair  Language: Fair  Akathisia:  No  Handed:  Right  AIMS (if indicated): not done  Assets:  Desire for Improvement Housing Social Support Transportation  ADL's:  Intact  Cognition: WNL  Sleep:  Fair   Screenings: Midwife Visit from 01/07/2022 in [facility] Office Visit from 10/05/2021 in [facility] Office Visit from 11/27/2020 in [facility] Office Visit from 09[facility]26/2020 in [facility]  AIMS Total Score 0 0 0 0      GAD-7    Flowsheet Row Office Visit from 03/10/2023 in [facility] Office Visit from 09/10/2022 in [facility] Office Visit from 01/07/2022 in [facility] Office Visit from 08/14/2021 in [facility] Office Visit from 11/27/2020 in [facility]  Total GAD-7 Score 10 2 4  0 4      PHQ2-9    Flowsheet Row Office Visit from 03/10/2023 in [facility] Office Visit from 09/10/2022 in [facility] Office Visit from 01/07/2022 in [facility] Office Visit from 10/05/2021 in [facility] Office Visit from 05/07/2021 in [facility]  PHQ-2 Total Score 3 0 1 2 0  PHQ-9 Total Score 7 2 -- 4 --      Flowsheet Row Video Visit from 08/27/2023 in [facility] Video Visit from 07/07/2023 in [facility] Video Visit from 05/09/2023 in [facility]  C-SSRS RISK CATEGORY No Risk  No Risk No Risk        Assessment and Plan: Jerrilyn Messinger Blasko is a 53 year old Caucasian female on SSI, history of bipolar disorder, tobacco use disorder was evaluated by telemedicine today.  Discussed assessment and plan as noted below.  Assessment & Plan Insomnia-stable Chronic insomnia managed with trazodone , Seroquel , and melatonin. She reports improved sleep with trazodone  200 mg at bedtime, Seroquel , and melatonin 10 mg, without daytime drowsiness. Advised to consider tapering Seroquel  due to potential weight gain, but she is hesitant as previous attempts with only trazodone  and melatonin were insufficient for sleep maintenance. Advised to skip Seroquel  intermittently to assess impact on sleep. - Continue Trazodone  200 mg at bedtime - Continue Seroquel  25 mg at bedtime, could change to as needed, consider tapering by skipping doses intermittently - Continue Melatonin 10 mg at bedtime  Bipolar depression in remission Managed with venlafaxine  and BuSpar . No suicidal ideation or psychosis. She is not interested in therapy at this time. - Continue Venlafaxine  extended release 225 mg daily - Continue BuSpar  30 mg twice daily - Continue Seroquel  25 mg at bedtime, could consider tapering of Seroquel  due to long-term side effects. - Continue Gabapentin  800 mg 4 times a day as prescribed for pain which is also a mood stabilizer.  Generalized anxiety disorder/Panic disorder-stable Currently reports anxiety symptoms are manageable on the current medication regimen.  Does have situational stressors however is not interested  in psychotherapy sessions. - Continue Venlafaxine  extended release 225 mg daily - Continue BuSpar  30 mg twice daily  Cannabis use disorder moderate-improving Currently continues to use cannabis. - Patient not interested in quitting  Tobacco use disorder-unstable Patient not interested in smoking cessation.  Follow-up Scheduled for follow-up in July. Recent labs show  slightly elevated kidney function and blood sugar levels. No immediate concerns requiring intervention.  Patient to follow up with primary care provider for abnormal labs.   Follow-up - Follow-up appointment on July 2nd at 9 AM in person.   Collaboration of Care: Collaboration of Care: Patient refused AEB patient declines referral for psychotherapy sessions.  Patient/Guardian was advised Release of Information must be obtained prior to any record release in order to collaborate their care with an outside provider. Patient/Guardian was advised if they have not already done so to contact the registration department to sign all necessary forms in order for us  to release information regarding their care.   Consent: Patient/Guardian gives verbal consent for treatment and assignment of benefits for services provided during this visit. Patient/Guardian expressed understanding and agreed to proceed.   This note was generated in part or whole with voice recognition software. Voice recognition is usually quite accurate but there are transcription errors that can and very often do occur. I apologize for any typographical errors that were not detected and corrected.    Zian Delair, MD 08/28/2023, 10:45 AM

## 2023-09-01 ENCOUNTER — Other Ambulatory Visit: Payer: Self-pay | Admitting: Family

## 2023-09-01 DIAGNOSIS — N3946 Mixed incontinence: Secondary | ICD-10-CM

## 2023-09-02 ENCOUNTER — Other Ambulatory Visit: Payer: Self-pay

## 2023-09-15 MED ORDER — TRAMADOL HCL 50 MG PO TABS: 50.0000 mg | ORAL_TABLET | Freq: Four times a day (QID) | ORAL | 2 refills | Status: DC | PRN

## 2023-09-18 ENCOUNTER — Other Ambulatory Visit: Payer: Self-pay | Admitting: Family

## 2023-09-18 ENCOUNTER — Other Ambulatory Visit: Payer: Self-pay | Admitting: Psychiatry

## 2023-09-18 DIAGNOSIS — F3178 Bipolar disorder, in full remission, most recent episode mixed: Secondary | ICD-10-CM

## 2023-09-18 DIAGNOSIS — F411 Generalized anxiety disorder: Secondary | ICD-10-CM

## 2023-09-19 ENCOUNTER — Telehealth: Payer: Self-pay

## 2023-09-19 NOTE — Telephone Encounter (Signed)
 Patient called requesting to get injection increased or to try mounjaro, please advise what youd like for the patient to do

## 2023-09-22 ENCOUNTER — Other Ambulatory Visit: Payer: Self-pay

## 2023-09-22 MED ORDER — WEGOVY 1 MG/0.5ML ~~LOC~~ SOAJ
1.0000 mg | SUBCUTANEOUS | 0 refills | Status: DC
Start: 2023-09-22 — End: 2023-10-21

## 2023-09-26 ENCOUNTER — Other Ambulatory Visit: Payer: Self-pay | Admitting: Family

## 2023-09-26 DIAGNOSIS — I739 Peripheral vascular disease, unspecified: Secondary | ICD-10-CM

## 2023-09-30 ENCOUNTER — Telehealth: Payer: Medicaid Other | Admitting: Psychiatry

## 2023-10-02 ENCOUNTER — Other Ambulatory Visit: Payer: Self-pay | Admitting: Family

## 2023-10-03 ENCOUNTER — Ambulatory Visit
Admission: RE | Admit: 2023-10-03 | Discharge: 2023-10-03 | Disposition: A | Discharge status: Home / Self Care | Source: Ambulatory Visit | Attending: Family | Admitting: Family

## 2023-10-03 DIAGNOSIS — I739 Peripheral vascular disease, unspecified: Secondary | ICD-10-CM

## 2023-10-06 ENCOUNTER — Ambulatory Visit: Payer: Self-pay | Admitting: Family

## 2023-10-06 ENCOUNTER — Other Ambulatory Visit: Payer: Self-pay

## 2023-10-07 ENCOUNTER — Other Ambulatory Visit: Payer: Self-pay

## 2023-10-07 MED ORDER — CYCLOBENZAPRINE HCL 10 MG PO TABS: 10.0000 mg | ORAL_TABLET | Freq: Three times a day (TID) | ORAL | 1 refills | Status: DC | PRN

## 2023-10-07 NOTE — Telephone Encounter (Signed)
 Pt called and left vm regarding rx refill cyclobenzaprine , per amanda verbal okay to refill, rx refill sent

## 2023-10-21 ENCOUNTER — Other Ambulatory Visit: Payer: Self-pay | Admitting: Family

## 2023-10-31 ENCOUNTER — Other Ambulatory Visit (HOSPITAL_COMMUNITY): Payer: Self-pay | Admitting: Psychiatry

## 2023-10-31 ENCOUNTER — Other Ambulatory Visit: Payer: Self-pay | Admitting: Family

## 2023-10-31 DIAGNOSIS — F411 Generalized anxiety disorder: Secondary | ICD-10-CM

## 2023-10-31 DIAGNOSIS — F410 Panic disorder [episodic paroxysmal anxiety] without agoraphobia: Secondary | ICD-10-CM

## 2023-11-12 ENCOUNTER — Other Ambulatory Visit: Payer: Self-pay | Admitting: Family

## 2023-11-18 ENCOUNTER — Other Ambulatory Visit: Payer: Self-pay | Admitting: Family

## 2023-11-19 ENCOUNTER — Other Ambulatory Visit: Payer: Self-pay

## 2023-11-19 ENCOUNTER — Encounter: Payer: Self-pay | Admitting: Psychiatry

## 2023-11-19 ENCOUNTER — Ambulatory Visit (INDEPENDENT_AMBULATORY_CARE_PROVIDER_SITE_OTHER): Admitting: Psychiatry

## 2023-11-19 VITALS — BP 115/73 | HR 84 | Temp 98.3°F | Ht 66.0 in | Wt 229.8 lb

## 2023-11-19 DIAGNOSIS — F411 Generalized anxiety disorder: Secondary | ICD-10-CM | POA: Diagnosis not present

## 2023-11-19 DIAGNOSIS — F3178 Bipolar disorder, in full remission, most recent episode mixed: Secondary | ICD-10-CM

## 2023-11-19 DIAGNOSIS — F17200 Nicotine dependence, unspecified, uncomplicated: Secondary | ICD-10-CM

## 2023-11-19 DIAGNOSIS — F1220 Cannabis dependence, uncomplicated: Secondary | ICD-10-CM

## 2023-11-19 DIAGNOSIS — G4701 Insomnia due to medical condition: Secondary | ICD-10-CM

## 2023-11-19 DIAGNOSIS — F410 Panic disorder [episodic paroxysmal anxiety] without agoraphobia: Secondary | ICD-10-CM

## 2023-11-19 MED ORDER — QUETIAPINE FUMARATE 25 MG PO TABS: 25.0000 mg | ORAL_TABLET | Freq: Every day | ORAL | 1 refills | Status: DC

## 2023-11-19 NOTE — Progress Notes (Signed)
 [facility] MD OP Progress Note  11/19/2023 3:06 PM Shelly Wright  MRN:  [id number]  Chief Complaint:  Chief Complaint  Patient presents with   Follow-up   Anxiety   Depression   Medication Refill   Discussed the use of AI scribe software for clinical note transcription with the patient, who gave verbal consent to proceed.  History of Present Illness Shelly Wright is a 53 year old Caucasian female, on SSI, has a history of bipolar disorder, GAD, panic disorder, cannabis use disorder, tobacco use disorder, married, lives in visit was evaluated by telemedicine today.  She has a history of bipolar disorder and generalized anxiety, with no recent symptoms of mania or depression. Her current medication regimen includes Seroquel  25 mg, venlafaxine  225 mg, and Buspar  30 mg, which she continues to take as prescribed.    Denies thoughts of self-harm are present.  She experiences sleep problems and is currently taking trazodone  and Seroquel  for management.  She was advised to try tapering of Seroquel  at her last visit.  She however has not done that yet since she is worried it might affect her sleep.  She would like to stay on this combination of medications for now.  She has been on Wegovy  for weight loss, resulting in a reduction from 245 lbs to 229 lbs since her last visit. She is satisfied with this progress.  She underwent carpal tunnel surgery on her right hand a week ago and currently experiences intermittent aching pain, rated at 3/10, managed with hydrocodone  5/325 mg every six hours as needed. Improvement in numbness in her fingers is noted, though some numbness persists, affecting her ability to perform tasks like washing dishes.  She continues to smoke cigarettes and use marijuana, and is not ready to cut back or quit.  Her recent blood work showed a hemoglobin A1c of 6.2, normal thyroid  and liver function, but slightly elevated creatinine levels.  She agrees to follow up with primary care  provider for management.  She is actively involved in supporting her cousin who has cancer, which has metastasized to the throat and bones. She assists with financial tasks and provides emotional support.  She denies any other concerns today.    Visit Diagnosis:    ICD-10-CM   1. Bipolar disorder, in full remission, most recent episode mixed (HCC)  F31.78     2. GAD (generalized anxiety disorder)  F41.1     3. Panic disorder  F41.0     4. Insomnia due to medical condition  G47.01    Multifactorial including pain, mood symptoms    5. Cannabis use disorder, moderate, dependence (HCC)  F12.20     6. Tobacco use disorder  F17.200 QUEtiapine  (SEROQUEL ) 25 MG tablet      Past Psychiatric History: I have reviewed past psychiatric history from progress note on 09/16/2018.  Past trials of venlafaxine , trazodone , Seroquel , hydroxyzine , Zoloft, Lexapro, duloxetine, Paxil, Abilify , Wellbutrin.  Past Medical History:  Past Medical History:  Diagnosis Date   Abnormal EKG    Acute right-sided low back pain with right-sided sciatica 08/30/2014   Anxiety    Arthritis    Asthma    in past, no current inhalers   Bipolar disorder (HCC)    Breathlessness on exertion 04/20/2014   Cannabis use disorder    Cervical spinal cord compression (HCC) 05/07/2012   Chronic pain syndrome    COPD (chronic obstructive pulmonary disease) (HCC)    DDD (degenerative disc disease), lumbar  Depression    DOE (dyspnea on exertion)    Dysrhythmia    irregular-h/o in the past   Extreme obesity    Fibromyalgia    H/O syncope 04/25/2014   High risk medication use 06/23/2019   Hyperlipidemia    Hypertension    Hypokalemia    Hyponatremia 05/07/2021   Long term current use of anticoagulant therapy (Lovenox ) 05/06/2016   Lumbar canal stenosis    Marijuana smoker    Neuritis or radiculitis due to rupture of lumbar intervertebral disc 05/23/2014   Noncompliance with treatment regimen 05/01/2020   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   a.) presurgical PCR (+) 02/02/2019 prior to LEFT TKA; b.) presurgical PCR (+) 03/06/2019 prior to RIGHT REVERSE SHOULDER/BICEPS TENODESIS; c.) presurgical PCR (+) 01/17/2022 prior to LEFT SHOULDER ARTHROSCOPY/OPEN ROTATOR CUFF REPAIR/SUBACROMIAL DECOMPRESSION/DISTAL CLAVICLE EXCISION/OPEN BICEPS TENODESIS   Partner relational problem 10/05/2021   Periprosthetic fracture around prosthetic knee 07/31/2015   Personal history of perinatal problems 04/25/2014   Pharmacologic therapy 11/30/2019   Pre-diabetes    Restless leg syndrome    Soft tissue lesion of shoulder region 01/24/2012   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   Syncope and collapse 04/08/2014   last was 2023   Thoracic and lumbosacral neuritis 05/07/2012   Thoracic neuritis 05/07/2012   Tobacco use disorder    Vitamin D  deficiency     Past Surgical History:  Procedure Laterality Date   ABDOMINAL HYSTERECTOMY     BICEPT TENODESIS Left 03/04/2023   Procedure: Left reverse shoulder arthroplasty, biceps tenodesis; open distal clavicle excision and AC joint cyst excision;  Surgeon: Tobie Priest, MD;  Location: [facility];  Service: Orthopedics;  Laterality: Left;   Cervical Fusion X 2     JOINT REPLACEMENT Right 01/2016   Dr Maryl   KNEE ARTHROPLASTY Left 02/10/2019   Procedure: COMPUTER ASSISTED TOTAL KNEE ARTHROPLASTY - RNFA;  Surgeon: Mardee Lynwood SQUIBB, MD;  Location: [facility];  Service: Orthopedics;  Laterality: Left;   KNEE ARTHROSCOPY Bilateral    Right knee scope 1998, Left knee scope   RESECTION DISTAL CLAVICAL Left 03/04/2023   Procedure: Left reverse shoulder arthroplasty, biceps tenodesis; open distal clavicle excision and AC joint cyst excision;  Surgeon: Tobie Priest, MD;  Location: [facility];  Service: Orthopedics;  Laterality: Left;   REVERSE SHOULDER ARTHROPLASTY Right 03/12/2021   Procedure: Right reverse  shoulder arthroplasty, biceps tenodesis;  Surgeon: Tobie Priest, MD;  Location: [facility];  Service: Orthopedics;  Laterality: Right;   REVERSE SHOULDER ARTHROPLASTY Left 03/04/2023   Procedure: Left reverse shoulder arthroplasty, biceps tenodesis; open distal clavicle excision and AC joint cyst excision;  Surgeon: Tobie Priest, MD;  Location: [facility];  Service: Orthopedics;  Laterality: Left;   SHOULDER ARTHROSCOPY WITH SUBACROMIAL DECOMPRESSION AND OPEN ROTATOR C Left 06/18/2021   Procedure: Left shoulder arthroscopy, Open rotator cuff repair, subacromial decompression, distal clavicle excision and, open biceps tenodesis;  Surgeon: Tobie Priest, MD;  Location: [facility];  Service: Orthopedics;  Laterality: Left;   TONSILLECTOMY     TOTAL KNEE ARTHROPLASTY Right 02/01/2015   Procedure: TOTAL KNEE ARTHROPLASTY;  Surgeon: Helayne Maryl, MD;  Location: [facility];  Service: Orthopedics;  Laterality: Right;   TUBAL LIGATION      Family Psychiatric History: I have reviewed family psychiatric history from progress note on 09/16/2018.  Family History:  Family History  Problem Relation Age of Onset   Breast cancer Mother 31  Colon cancer Father    Heart attack Father    Bipolar disorder Sister    Depression Sister    Schizophrenia Sister     Social History: I have reviewed social history from progress note on 09/16/2018. Social History   Socioeconomic History   Marital status: Married    Spouse name: Lamar   Number of children: 1   Years of education: Not on file   Highest education level: High school graduate  Occupational History   Not on file  Tobacco Use   Smoking status: Every Day    Current packs/day: 0.00    Average packs/day: 1 pack/day for 30.0 years (30.0 ttl pk-yrs)    Types: Cigarettes    Start date: 09/12/1991    Last attempt to quit: 09/11/2021    Years since quitting: 2.1   Smokeless tobacco: Never  Vaping Use   Vaping status: Never Used  Substance and Sexual Activity    Alcohol use: No   Drug use: Yes    Types: Marijuana    Comment: every day   Sexual activity: Not Currently  Other Topics Concern   Not on file  Social History Narrative   Not on file   Social Drivers of Health   Financial Resource Strain: High Risk (11/14/2023)   Received from [facility]   Overall Financial Resource Strain (CARDIA)    Difficulty of Paying Living Expenses: Very hard  Food Insecurity: Food Insecurity Present (11/14/2023)   Received from [facility]   Hunger Vital Sign    Within the past 12 months, you worried that your food would run out before you got the money to buy more.: Sometimes true    Within the past 12 months, the food you bought just didn't last and you didn't have money to get more.: Sometimes true  Transportation Needs: No Transportation Needs (11/14/2023)   Received from [facility] - Transportation    In the past 12 months, has lack of transportation kept you from medical appointments or from getting medications?: No    Lack of Transportation (Non-Medical): No  Physical Activity: Inactive (09/16/2018)   Exercise Vital Sign    Days of Exercise per Week: 0 days    Minutes of Exercise per Session: 0 min  Stress: Stress Concern Present (09/16/2018)   [employer] of Occupational Health - Occupational Stress Questionnaire    Feeling of Stress : To some extent  Social Connections: Unknown (09/16/2018)   Social Connection and Isolation Panel    Frequency of Communication with Friends and Family: Not on file    Frequency of Social Gatherings with Friends and Family: Not on file    Attends Religious Services: Never    Active Member of Clubs or Organizations: No    Attends Banker Meetings: Never    Marital Status: Separated    Allergies: No Known Allergies  Metabolic Disorder Labs: Lab Results  Component Value Date   HGBA1C 6.2 (H) 08/12/2023   MPG 128.37 02/19/2023    MPG 131.24 09/26/2020   Lab Results  Component Value Date   PROLACTIN 6.4 09/26/2020   Lab Results  Component Value Date   CHOL 168 08/12/2023   TRIG 141 08/12/2023   HDL 42 08/12/2023   CHOLHDL 4.0 08/12/2023   VLDL 20 09/26/2020   LDLCALC 101 (H) 08/12/2023   LDLCALC 92 04/14/2023   Lab Results  Component Value Date   TSH 0.602 08/12/2023  TSH 0.883 04/14/2023    Therapeutic Level Labs: No results found for: LITHIUM No results found for: VALPROATE No results found for: CBMZ  Current Medications: Current Outpatient Medications  Medication Sig Dispense Refill   HYDROcodone -acetaminophen  (NORCO/VICODIN) 5-325 MG tablet Take 1 tablet by mouth every 6 (six) hours as needed for moderate pain (pain score 4-6).     acetaminophen  (TYLENOL ) 500 MG tablet Take 1,000 mg by mouth daily.     amLODipine -benazepril  (LOTREL) 5-20 MG capsule Take 1 capsule by mouth daily. 90 capsule 1   busPIRone  (BUSPAR ) 30 MG tablet TAKE 1 TABLET BY MOUTH TWICE DAILY 180 tablet 1   Crisaborole  (EUCRISA ) 2 % OINT Apply topically to affected areas 60 g 11   cyclobenzaprine  (FLEXERIL ) 10 MG tablet TAKE ONE TABLET BY MOUTH 3 TIMES DAILY AS NEEDED FOR MUSCLE SPASMS 90 tablet 1   gabapentin  (NEURONTIN ) 800 MG tablet Take 1 tablet (800 mg total) by mouth 3 (three) times daily. 90 tablet 5   GEMTESA  75 MG TABS TAKE ONE TABLET BY MOUTH EVERY DAY 30 tablet 5   lidocaine  (XYLOCAINE ) 5 % ointment Apply 1 Application topically as needed for moderate pain or mild pain. (Patient taking differently: Apply 1 Application topically daily at 6 (six) AM. Knees, back, shoulders) 35.44 g 5   loratadine  (CLARITIN ) 10 MG tablet Take 1 tablet (10 mg total) by mouth daily as needed for allergies. 90 tablet 3   Melatonin 5 MG CAPS Take 1 capsule (5 mg total) by mouth at bedtime. (Patient taking differently: Take 10 mg by mouth at bedtime.)     meloxicam  (MOBIC ) 15 MG tablet TAKE ONE TABLET BY MOUTH ONCE DAILY AS NEEDED 90  tablet 1   QUEtiapine  (SEROQUEL ) 25 MG tablet Take 1 tablet (25 mg total) by mouth at bedtime. 90 tablet 1   simvastatin  (ZOCOR ) 20 MG tablet TAKE ONE TABLET BY MOUTH AT BEDTIME 90 tablet 1   traMADol  (ULTRAM ) 50 MG tablet Take 1 tablet (50 mg total) by mouth every 6 (six) hours as needed. 120 tablet 2   traZODone  (DESYREL ) 100 MG tablet TAKE 2 TABLETS BY MOUTH AT BEDTIME AS NEEDED FOR SLEEP 180 tablet 1   venlafaxine  XR (EFFEXOR -XR) 150 MG 24 hr capsule Take 1 capsule (150 mg total) by mouth daily with breakfast. TAKE ALONG WITH 75 MG 90 capsule 1   venlafaxine  XR (EFFEXOR -XR) 75 MG 24 hr capsule Take 1 capsule (75 mg total) by mouth daily with breakfast. TAKE ALONG WITH 150 MG 90 capsule 1   VENTOLIN  HFA 108 (90 Base) MCG/ACT inhaler INHALE 2 PUFFS INTO LUNGS EVERY 6 HOURS AS NEEDED FOR WHEEZING OR SHORTNESS OF BREATH 18 g 5   WEGOVY  1 MG/0.5ML SOAJ INJECT 1 MG INTO THE SKIN EVERY 7 DAYS 2 mL 0   No current facility-administered medications for this visit.     Musculoskeletal: Strength & Muscle Tone: within normal limits Gait & Station: walks with cane Patient leans: N/A  Psychiatric Specialty Exam: Review of Systems  Psychiatric/Behavioral: Negative.      Blood pressure 115/73, pulse 84, temperature 98.3 F (36.8 C), temperature source Temporal, height 5' 6 (1.676 m), weight 229 lb 12.8 oz (104.2 kg).Body mass index is 37.09 kg/m.  General Appearance: Casual  Eye Contact:  Good  Speech:  Normal Rate  Volume:  Normal  Mood:  Euthymic  Affect:  Congruent  Thought Process:  Goal Directed and Descriptions of Associations: Intact  Orientation:  Full (Time, Place, and Person)  Thought Content: Logical   Suicidal Thoughts:  No  Homicidal Thoughts:  No  Memory:  Immediate;   Fair Recent;   Fair Remote;   Fair  Judgement:  Fair  Insight:  Fair  Psychomotor Activity:  Normal  Concentration:  Concentration: Fair and Attention Span: Fair  Recall:  [employer] of Knowledge: Fair   Language: Fair  Akathisia:  No  Handed:  Right  AIMS (if indicated): done  Assets:  Communication Skills Desire for Improvement Housing Social Support Transportation  ADL's:  Intact  Cognition: WNL  Sleep:  Fair   Screenings: Midwife Visit from 11/19/2023 in [facility] Office Visit from 01/07/2022 in [facility] Office Visit from 10/05/2021 in [facility] Office Visit from 11/27/2020 in [facility] Office Visit from 09/26/2020 in [facility]  AIMS Total Score 0 0 0 0 0   GAD-7    Flowsheet Row Office Visit from 11/19/2023 in [facility] Office Visit from 03/10/2023 in [facility] Office Visit from 09/10/2022 in [facility] Office Visit from 01/07/2022 in [facility] Office Visit from 08/14/2021 in [facility]  Total GAD-7 Score 3 10 2 4  0   PHQ2-9    Flowsheet Row Office Visit from 11/19/2023 in [facility] Office Visit from 03/10/2023 in [facility] Office Visit from 09/10/2022 in [facility] Office Visit from 01/07/2022 in [facility] Office Visit from 10/05/2021 in [facility]  PHQ-2 Total Score 1 3 0 1 2  PHQ-9 Total Score -- 7 2 -- 4   Flowsheet Row Office Visit from 11/19/2023 in [facility] Video Visit from 08/27/2023 in [facility] Video Visit from 07/07/2023 in [facility]  C-SSRS RISK CATEGORY No Risk No Risk No Risk     Assessment and Plan: Shelly Wright is a 53 year old Caucasian female, has a history of bipolar disorder, tobacco use disorder, generalized anxiety disorder, panic disorder was evaluated in office today.  Discussed assessment and plan as noted below.  Bipolar disorder in remission Currently reports mood symptoms are stable.  Compliant on medications, denies side effects.  Discussed tapering of Seroquel  last visit however she would like to stay on the Seroquel  since she is worried it might cause insomnia if she comes off of it. Continue Venlafaxine  extended release 225 mg daily Continue BuSpar  30 mg twice daily Continue Seroquel  25 mg at bedtime. Continue Gabapentin  800 mg 4 times a day as prescribed for pain.  Generalized anxiety disorder/Panic disorder-stable Currently denies any significant anxiety symptoms. Continue Venlafaxine  as prescribed Continue BuSpar  30 mg twice daily  Insomnia-stable History of chronic insomnia currently well managed on current combination of medications. Continue Trazodone  200 mg at bedtime Continue Seroquel  25 mg at bedtime Continue Melatonin 10 mg at bedtime.  Cannabis use disorder/Tobacco use disorder-unstable Currently not interested in quitting. Will reevaluate in future sessions.  Follow-up Follow-up in clinic in 3 months or sooner if needed.     Consent: Patient/Guardian gives verbal consent for treatment and assignment of benefits for services provided during this visit. Patient/Guardian expressed understanding  and agreed to proceed.  This note was generated in part or whole with voice recognition software. Voice recognition is usually quite accurate but there are transcription errors that can and very often do occur. I apologize for any typographical errors that were not detected and corrected.     Sallyanne Birkhead, MD 11/19/2023, 3:06 PM

## 2023-11-29 ENCOUNTER — Other Ambulatory Visit (HOSPITAL_COMMUNITY): Payer: Self-pay | Admitting: Psychiatry

## 2023-11-29 ENCOUNTER — Other Ambulatory Visit: Payer: Self-pay | Admitting: Family

## 2023-11-29 DIAGNOSIS — F410 Panic disorder [episodic paroxysmal anxiety] without agoraphobia: Secondary | ICD-10-CM

## 2023-11-29 DIAGNOSIS — F411 Generalized anxiety disorder: Secondary | ICD-10-CM

## 2023-12-02 ENCOUNTER — Other Ambulatory Visit: Payer: Self-pay | Admitting: Family

## 2023-12-02 MED ORDER — WEGOVY 1.7 MG/0.75ML ~~LOC~~ SOAJ: 1.7000 mg | SUBCUTANEOUS | 0 refills | Status: DC

## 2023-12-03 NOTE — Telephone Encounter (Signed)
 Dose increased.

## 2023-12-05 ENCOUNTER — Ambulatory Visit: Admitting: Family

## 2023-12-05 ENCOUNTER — Encounter: Payer: Self-pay | Admitting: Family

## 2023-12-05 VITALS — BP 110/68 | HR 87 | Ht 66.0 in | Wt 226.2 lb

## 2023-12-05 DIAGNOSIS — E538 Deficiency of other specified B group vitamins: Secondary | ICD-10-CM

## 2023-12-05 DIAGNOSIS — I10 Essential (primary) hypertension: Secondary | ICD-10-CM

## 2023-12-05 DIAGNOSIS — M47816 Spondylosis without myelopathy or radiculopathy, lumbar region: Secondary | ICD-10-CM

## 2023-12-05 DIAGNOSIS — R7303 Prediabetes: Secondary | ICD-10-CM | POA: Diagnosis not present

## 2023-12-05 DIAGNOSIS — M48062 Spinal stenosis, lumbar region with neurogenic claudication: Secondary | ICD-10-CM

## 2023-12-05 DIAGNOSIS — M51362 Other intervertebral disc degeneration, lumbar region with discogenic back pain and lower extremity pain: Secondary | ICD-10-CM

## 2023-12-05 DIAGNOSIS — E782 Mixed hyperlipidemia: Secondary | ICD-10-CM

## 2023-12-05 DIAGNOSIS — R5383 Other fatigue: Secondary | ICD-10-CM

## 2023-12-05 DIAGNOSIS — Z1231 Encounter for screening mammogram for malignant neoplasm of breast: Secondary | ICD-10-CM

## 2023-12-05 DIAGNOSIS — E559 Vitamin D deficiency, unspecified: Secondary | ICD-10-CM | POA: Diagnosis not present

## 2023-12-05 MED ORDER — WEGOVY 2.4 MG/0.75ML ~~LOC~~ SOAJ: 2.4000 mg | SUBCUTANEOUS | 3 refills | Status: DC

## 2023-12-05 NOTE — Assessment & Plan Note (Signed)
 Patient stable.  Well controlled with current therapy.   Continue current meds.

## 2023-12-05 NOTE — Assessment & Plan Note (Signed)
 Blood pressure well controlled with current medications.  Continue current therapy.  Will reassess at follow up.   - CBC w/Diff - CMP w/eGFR

## 2023-12-05 NOTE — Assessment & Plan Note (Signed)
 Checking labs today.  Will continue supplements as needed.   - Vitamin D  - Vitamin B12 - TSH

## 2023-12-05 NOTE — Assessment & Plan Note (Signed)
 Checking labs today.  Continue current therapy for lipid control. Will modify as needed based on labwork results.   -CMP w/eGFR -Lipid Panel

## 2023-12-05 NOTE — Progress Notes (Signed)
 Established Patient Office Visit  Subjective:  Patient ID: Shelly Wright, female    DOB: December 09, 1970  Age: 53 y.o. MRN: [id number]  Chief Complaint  Patient presents with   Follow-up    4 month follow up    Patient is here today for her 3 months follow up.  She has been feeling fairly well since last appointment.   She does not have additional concerns to discuss today.  She is doing well with the Wegovy .  No side effects.  Labs are due today.  She needs refills.   I have reviewed her active problem list, medication list, allergies, health maintenance, notes from last encounter, lab results for her appointment today.      No other concerns at this time.   Past Medical History:  Diagnosis Date   Abnormal EKG    Acute right-sided low back pain with right-sided sciatica 08/30/2014   Anxiety    Arthritis    Asthma    in past, no current inhalers   Bipolar disorder (HCC)    Breathlessness on exertion 04/20/2014   Cannabis use disorder    Cervical spinal cord compression (HCC) 05/07/2012   Chronic pain syndrome    COPD (chronic obstructive pulmonary disease) (HCC)    DDD (degenerative disc disease), lumbar    Depression    DOE (dyspnea on exertion)    Dysrhythmia    irregular-h/o in the past   Extreme obesity    Fibromyalgia    H/O syncope 04/25/2014   High risk medication use 06/23/2019   Hyperlipidemia    Hypertension    Hypokalemia    Hyponatremia 05/07/2021   Long term current use of anticoagulant therapy (Lovenox ) 05/06/2016   Lumbar canal stenosis    Marijuana smoker    Neuritis or radiculitis due to rupture of lumbar intervertebral disc 05/23/2014   Noncompliance with treatment regimen 05/01/2020   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   a.) presurgical PCR (+) 02/02/2019 prior to LEFT TKA; b.) presurgical PCR (+) 03/06/2019 prior to RIGHT REVERSE SHOULDER/BICEPS TENODESIS; c.) presurgical PCR (+) 01/17/2022 prior to LEFT SHOULDER ARTHROSCOPY/OPEN ROTATOR  CUFF REPAIR/SUBACROMIAL DECOMPRESSION/DISTAL CLAVICLE EXCISION/OPEN BICEPS TENODESIS   Partner relational problem 10/05/2021   Periprosthetic fracture around prosthetic knee 07/31/2015   Personal history of perinatal problems 04/25/2014   Pharmacologic therapy 11/30/2019   Pre-diabetes    Restless leg syndrome    Soft tissue lesion of shoulder region 01/24/2012   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   Syncope and collapse 04/08/2014   last was 2023   Thoracic and lumbosacral neuritis 05/07/2012   Thoracic neuritis 05/07/2012   Tobacco use disorder    Vitamin D  deficiency     Past Surgical History:  Procedure Laterality Date   ABDOMINAL HYSTERECTOMY     BICEPT TENODESIS Left 03/04/2023   Procedure: Left reverse shoulder arthroplasty, biceps tenodesis; open distal clavicle excision and AC joint cyst excision;  Surgeon: Tobie Priest, MD;  Location: [facility];  Service: Orthopedics;  Laterality: Left;   Cervical Fusion X 2     JOINT REPLACEMENT Right 01/2016   Dr Maryl   KNEE ARTHROPLASTY Left 02/10/2019   Procedure: COMPUTER ASSISTED TOTAL KNEE ARTHROPLASTY - RNFA;  Surgeon: Mardee Lynwood SQUIBB, MD;  Location: [facility];  Service: Orthopedics;  Laterality: Left;   KNEE ARTHROSCOPY Bilateral    Right knee scope 1998, Left knee scope   RESECTION DISTAL CLAVICAL Left 03/04/2023  Procedure: Left reverse shoulder arthroplasty, biceps tenodesis; open distal clavicle excision and AC joint cyst excision;  Surgeon: Tobie Priest, MD;  Location: [facility];  Service: Orthopedics;  Laterality: Left;   REVERSE SHOULDER ARTHROPLASTY Right 03/12/2021   Procedure: Right reverse shoulder arthroplasty, biceps tenodesis;  Surgeon: Tobie Priest, MD;  Location: [facility];  Service: Orthopedics;  Laterality: Right;   REVERSE SHOULDER ARTHROPLASTY Left 03/04/2023   Procedure: Left reverse shoulder arthroplasty, biceps tenodesis; open  distal clavicle excision and AC joint cyst excision;  Surgeon: Tobie Priest, MD;  Location: [facility];  Service: Orthopedics;  Laterality: Left;   SHOULDER ARTHROSCOPY WITH SUBACROMIAL DECOMPRESSION AND OPEN ROTATOR C Left 06/18/2021   Procedure: Left shoulder arthroscopy, Open rotator cuff repair, subacromial decompression, distal clavicle excision and, open biceps tenodesis;  Surgeon: Tobie Priest, MD;  Location: [facility];  Service: Orthopedics;  Laterality: Left;   TONSILLECTOMY     TOTAL KNEE ARTHROPLASTY Right 02/01/2015   Procedure: TOTAL KNEE ARTHROPLASTY;  Surgeon: Helayne Glenn, MD;  Location: [facility];  Service: Orthopedics;  Laterality: Right;   TUBAL LIGATION      Social History   Socioeconomic History   Marital status: Married    Spouse name: Lamar   Number of children: 1   Years of education: Not on file   Highest education level: High school graduate  Occupational History   Not on file  Tobacco Use   Smoking status: Every Day    Current packs/day: 0.00    Average packs/day: 1 pack/day for 30.0 years (30.0 ttl pk-yrs)    Types: Cigarettes    Start date: 09/12/1991    Last attempt to quit: 09/11/2021    Years since quitting: 2.2   Smokeless tobacco: Never  Vaping Use   Vaping status: Never Used  Substance and Sexual Activity   Alcohol use: No   Drug use: Yes    Types: Marijuana    Comment: every day   Sexual activity: Not Currently  Other Topics Concern   Not on file  Social History Narrative   Not on file   Social Drivers of Health   Financial Resource Strain: Low Risk  (11/26/2023)   [facility]   Overall Financial Resource Strain (CARDIA)    Difficulty of Paying Living Expenses: Not hard at all  Recent Concern: Financial Resource Strain - High Risk (11/14/2023)   Received from [facility]   Overall Financial Resource Strain (CARDIA)    Difficulty of Paying Living Expenses: Very hard  Food Insecurity: No  Food Insecurity (11/26/2023)   Received from [facility]   Hunger Vital Sign    Within the past 12 months, you worried that your food would run out before you got the money to buy more.: Never true    Within the past 12 months, the food you bought just didn't last and you didn't have money to get more.: Never true  Recent Concern: Food Insecurity - Food Insecurity Present (11/14/2023)   Received from [facility]   Hunger Vital Sign    Within the past 12 months, you worried that your food would run out before you got the money to buy more.: Sometimes true    Within the past 12 months, the food you bought just didn't last and you didn't have money to get more.: Sometimes true  Transportation Needs: No Transportation Needs (11/26/2023)   Received from [facility]   [facility] - Transportation  In the past 12 months, has lack of transportation kept you from medical appointments or from getting medications?: No    Lack of Transportation (Non-Medical): No  Physical Activity: Inactive (09/16/2018)   Exercise Vital Sign    Days of Exercise per Week: 0 days    Minutes of Exercise per Session: 0 min  Stress: Stress Concern Present (09/16/2018)   [employer] of Occupational [employer] - Occupational Stress Questionnaire    Feeling of Stress : To some extent  Social Connections: Unknown (09/16/2018)   Social Connection and Isolation Panel    Frequency of Communication with Friends and Family: Not on file    Frequency of Social Gatherings with Friends and Family: Not on file    Attends Religious Services: Never    Active Member of Clubs or Organizations: No    Attends Banker Meetings: Never    Marital Status: Separated  Intimate Partner Violence: Not At Risk (09/16/2018)   Humiliation, Afraid, Rape, and Kick questionnaire    Fear of Current or Ex-Partner: No    Emotionally Abused: No    Physically Abused: No    Sexually Abused: No     Family History  Problem Relation Age of Onset   Breast cancer Mother 17   Colon cancer Father    Heart attack Father    Bipolar disorder Sister    Depression Sister    Schizophrenia Sister     No Known Allergies  Review of Systems  Musculoskeletal:  Positive for back pain and joint pain.  All other systems reviewed and are negative.      Objective:   BP 110/68   Pulse 87   Ht 5' 6 (1.676 m)   Wt 226 lb 3.2 oz (102.6 kg)   SpO2 96%   BMI 36.51 kg/m   Vitals:   12/05/23 0920  BP: 110/68  Pulse: 87  Height: 5' 6 (1.676 m)  Weight: 226 lb 3.2 oz (102.6 kg)  SpO2: 96%  BMI (Calculated): 36.53    Physical Exam Vitals and nursing note reviewed.  Constitutional:      Appearance: Normal appearance. She is normal weight.  HENT:     Head: Normocephalic.  Eyes:     Extraocular Movements: Extraocular movements intact.     Conjunctiva/sclera: Conjunctivae normal.     Pupils: Pupils are equal, round, and reactive to light.  Cardiovascular:     Rate and Rhythm: Normal rate.  Pulmonary:     Effort: Pulmonary effort is normal.  Neurological:     General: No focal deficit present.     Mental Status: She is alert and oriented to person, place, and time. Mental status is at baseline.  Psychiatric:        Mood and Affect: Mood normal.        Behavior: Behavior normal.        Thought Content: Thought content normal.        Judgment: Judgment normal.      No results found for any visits on 12/05/23.  No results found for this or any previous visit (from the past 2160 hours).     Assessment & Plan Vitamin D  deficiency, unspecified B12 deficiency due to diet Other fatigue Checking labs today.  Will continue supplements as needed.   - Vitamin D  - Vitamin B12 - TSH  Prediabetes A1C Continues to be in prediabetic ranges.  Will reassess at follow up after next lab check.  Patient counseled on dietary choices and verbalized  understanding.   -CBC  w/Diff -CMP w/eGFR -Hemoglobin A1C  Mixed hyperlipidemia Checking labs today.  Continue current therapy for lipid control. Will modify as needed based on labwork results.   -CMP w/eGFR -Lipid Panel  Essential hypertension, benign Blood pressure well controlled with current medications.  Continue current therapy.  Will reassess at follow up.   - CBC w/Diff - CMP w/eGFR  Screening mammogram for breast cancer Mammo ordered today.  Pt aware they will call her to schedule.   Lumbar facet syndrome (Bilateral) (L>R) Degeneration of intervertebral disc of lumbar region with discogenic back pain and lower extremity pain Spinal stenosis of lumbar region with neurogenic claudication Patient stable.  Well controlled with current therapy.   Continue current meds.      Return in about 4 months (around 04/06/2024).   Total time spent: 20 minutes  ALAN CHRISTELLA ARRANT, FNP  12/05/2023   This document [place] have been prepared by [facility] Voice Recognition software and as such [place] include unintentional dictation errors.

## 2023-12-05 NOTE — Assessment & Plan Note (Signed)
 A1C Continues to be in prediabetic ranges.  Will reassess at follow up after next lab check.  Patient counseled on dietary choices and verbalized understanding.   -CBC w/Diff -CMP w/eGFR -Hemoglobin A1C

## 2023-12-06 LAB — CBC WITH DIFFERENTIAL/PLATELET
Basophils Absolute: 0.1 x10E3/uL (ref 0.0–0.2)
Basos: 1 %
EOS (ABSOLUTE): 0.2 x10E3/uL (ref 0.0–0.4)
Eos: 3 %
Hematocrit: 46.5 % (ref 34.0–46.6)
Hemoglobin: 14.9 g/dL (ref 11.1–15.9)
Immature Grans (Abs): 0 x10E3/uL (ref 0.0–0.1)
Immature Granulocytes: 0 %
Lymphocytes Absolute: 3.5 x10E3/uL — ABNORMAL HIGH (ref 0.7–3.1)
Lymphs: 39 %
MCH: 31 pg (ref 26.6–33.0)
MCHC: 32 g/dL (ref 31.5–35.7)
MCV: 97 fL (ref 79–97)
Monocytes Absolute: 0.6 x10E3/uL (ref 0.1–0.9)
Monocytes: 7 %
Neutrophils Absolute: 4.6 x10E3/uL (ref 1.4–7.0)
Neutrophils: 50 %
Platelets: 266 x10E3/uL (ref 150–450)
RBC: 4.8 x10E6/uL (ref 3.77–5.28)
RDW: 12.8 % (ref 11.7–15.4)
WBC: 9 x10E3/uL (ref 3.4–10.8)

## 2023-12-06 LAB — LIPID PANEL
Chol/HDL Ratio: 3.1 ratio (ref 0.0–4.4)
Cholesterol, Total: 136 mg/dL (ref 100–199)
HDL: 44 mg/dL (ref >39)
LDL Chol Calc (NIH): 70 mg/dL (ref 0–99)
Triglycerides: 122 mg/dL (ref 0–149)
VLDL Cholesterol Cal: 22 mg/dL (ref 5–40)

## 2023-12-06 LAB — TSH: TSH: 0.59 u[IU]/mL (ref 0.450–4.500)

## 2023-12-06 LAB — CMP14+EGFR
ALT: 19 IU/L (ref 0–32)
AST: 20 IU/L (ref 0–40)
Albumin: 4.6 g/dL (ref 3.8–4.9)
Alkaline Phosphatase: 92 IU/L (ref 44–121)
BUN/Creatinine Ratio: 20 (ref 9–23)
BUN: 21 mg/dL (ref 6–24)
Bilirubin Total: <0.2 mg/dL (ref 0.0–1.2)
CO2: 24 mmol/L (ref 20–29)
Calcium: 10.1 mg/dL (ref 8.7–10.2)
Chloride: 103 mmol/L (ref 96–106)
Creatinine, Ser: 1.06 mg/dL — ABNORMAL HIGH (ref 0.57–1.00)
Globulin, Total: 2 g/dL (ref 1.5–4.5)
Glucose: 90 mg/dL (ref 70–99)
Potassium: 5.4 mmol/L — ABNORMAL HIGH (ref 3.5–5.2)
Sodium: 141 mmol/L (ref 134–144)
Total Protein: 6.6 g/dL (ref 6.0–8.5)
eGFR: 62 mL/min/1.73 (ref >59)

## 2023-12-06 LAB — HEMOGLOBIN A1C
Est. average glucose Bld gHb Est-mCnc: 111 mg/dL
Hgb A1c MFr Bld: 5.5 % (ref 4.8–5.6)

## 2023-12-06 LAB — VITAMIN B12: Vitamin B-12: 595 pg/mL (ref 232–1245)

## 2023-12-06 LAB — VITAMIN D 25 HYDROXY (VIT D DEFICIENCY, FRACTURES): Vit D, 25-Hydroxy: 60.9 ng/mL (ref 30.0–100.0)

## 2023-12-12 ENCOUNTER — Ambulatory Visit: Admitting: Family

## 2023-12-24 ENCOUNTER — Other Ambulatory Visit: Payer: Self-pay | Admitting: Family

## 2024-01-15 ENCOUNTER — Other Ambulatory Visit: Payer: Self-pay | Admitting: Family

## 2024-01-15 ENCOUNTER — Ambulatory Visit: Payer: Medicaid Other | Admitting: Dermatology

## 2024-01-20 ENCOUNTER — Other Ambulatory Visit (HOSPITAL_COMMUNITY): Payer: Self-pay | Admitting: Psychiatry

## 2024-01-20 DIAGNOSIS — F410 Panic disorder [episodic paroxysmal anxiety] without agoraphobia: Secondary | ICD-10-CM

## 2024-01-20 DIAGNOSIS — F411 Generalized anxiety disorder: Secondary | ICD-10-CM

## 2024-02-11 ENCOUNTER — Telehealth: Payer: Self-pay | Admitting: Family

## 2024-02-11 NOTE — Telephone Encounter (Signed)
 Patient left VM stating she received a letter from [facility] stating that Wegovy  will no longer be covered as of 10/1. She wants to know if we can start her on something similar to Wegovy . Please advise.

## 2024-02-16 ENCOUNTER — Ambulatory Visit: Payer: Self-pay

## 2024-02-18 ENCOUNTER — Other Ambulatory Visit (HOSPITAL_COMMUNITY): Payer: Self-pay | Admitting: Psychiatry

## 2024-02-18 DIAGNOSIS — F410 Panic disorder [episodic paroxysmal anxiety] without agoraphobia: Secondary | ICD-10-CM

## 2024-02-18 DIAGNOSIS — F411 Generalized anxiety disorder: Secondary | ICD-10-CM

## 2024-02-19 ENCOUNTER — Encounter: Payer: Self-pay | Admitting: Psychiatry

## 2024-02-19 ENCOUNTER — Telehealth: Payer: Self-pay | Admitting: Family

## 2024-02-19 ENCOUNTER — Other Ambulatory Visit: Payer: Self-pay

## 2024-02-19 ENCOUNTER — Ambulatory Visit: Admitting: Psychiatry

## 2024-02-19 VITALS — BP 149/79 | HR 86 | Temp 97.5°F | Ht 66.0 in | Wt 218.0 lb

## 2024-02-19 DIAGNOSIS — Z634 Disappearance and death of family member: Secondary | ICD-10-CM | POA: Insufficient documentation

## 2024-02-19 DIAGNOSIS — F17200 Nicotine dependence, unspecified, uncomplicated: Secondary | ICD-10-CM

## 2024-02-19 DIAGNOSIS — F3178 Bipolar disorder, in full remission, most recent episode mixed: Secondary | ICD-10-CM | POA: Diagnosis not present

## 2024-02-19 DIAGNOSIS — G4701 Insomnia due to medical condition: Secondary | ICD-10-CM | POA: Diagnosis not present

## 2024-02-19 DIAGNOSIS — F411 Generalized anxiety disorder: Secondary | ICD-10-CM | POA: Diagnosis not present

## 2024-02-19 DIAGNOSIS — F1220 Cannabis dependence, uncomplicated: Secondary | ICD-10-CM

## 2024-02-19 DIAGNOSIS — F410 Panic disorder [episodic paroxysmal anxiety] without agoraphobia: Secondary | ICD-10-CM

## 2024-02-19 DIAGNOSIS — Z9189 Other specified personal risk factors, not elsewhere classified: Secondary | ICD-10-CM | POA: Insufficient documentation

## 2024-02-19 MED ORDER — BUSPIRONE HCL 30 MG PO TABS: 30.0000 mg | ORAL_TABLET | Freq: Two times a day (BID) | ORAL | 1 refills | Status: AC

## 2024-02-19 MED ORDER — QUETIAPINE FUMARATE 50 MG PO TABS: 50.0000 mg | ORAL_TABLET | Freq: Every day | ORAL | 1 refills | Status: AC

## 2024-02-19 NOTE — Patient Instructions (Signed)
 Please call for EKG - [phone]  Managing Loss, Adult People experience loss in many different ways throughout their lives. Events such as moving, changing jobs, and losing friends can create a sense of loss. The loss Marlar be as serious as a major health change, divorce, death of a pet, or death of a loved one. All of these types of loss are likely to create a physical and emotional reaction known as grief. Grief is the result of a major change or an absence of something or someone that you count on. Grief is a normal reaction to loss. A variety of factors can affect your grieving experience, including: The nature of your loss. Your relationship to what or whom you lost. Your understanding of grief and how to manage it. Your support system. Be aware that when grief becomes extreme, it can lead to more severe issues like isolation, depression, anxiety, or suicidal thoughts. Talk with your health care provider if you have any of these issues. How to manage lifestyle changes Keep to your normal routine as much as possible. If you have trouble focusing or doing normal activities, it is acceptable to take some time away from your normal routine. Spend time with friends and loved ones. Eat a healthy diet, get plenty of sleep, and rest when you feel tired. How to recognize changes  The way that you deal with your grief will affect your ability to function as you normally do. When grieving, you [place] experience these changes: Numbness, shock, sadness, anxiety, anger, denial, and guilt. Thoughts about death. Unexpected crying. A physical sensation of emptiness in your stomach. Problems sleeping and eating. Tiredness (fatigue). Loss of interest in normal activities. Dreaming about or imagining seeing the person who died. A need to remember what or whom you lost. Difficulty thinking about anything other than your loss for a period of time. Relief. If you have been expecting the loss for a while, you  [place] feel a sense of relief when it happens. Follow these instructions at home: Activity Express your feelings in healthy ways, such as: Talking with others about your loss. It [place] be helpful to find others who have had a similar loss, such as a support group. Writing down your feelings in a journal. Doing physical activities to release stress and emotional energy. Doing creative activities like painting, sculpting, or playing or listening to music. Practicing resilience. This is the ability to recover and adjust after facing challenges. Reading some resources that encourage resilience [place] help you to learn ways to practice those behaviors.  General instructions Be patient with yourself and others. Allow the grieving process to happen, and remember that grieving takes time. It is likely that you [place] never feel completely done with some grief. You Jourdan find a way to move on while still cherishing memories and feelings about your loss. Accepting your loss is a process. It can take months or longer to adjust. Keep all follow-up visits. This is important. Where to find support To get support for managing loss: Ask your health care provider for help and recommendations, such as grief counseling or therapy. Think about joining a support group for people who are managing a loss. Where to find more information You can find more information about managing loss from: American Society of Clinical Oncology: www.cancer.net American Psychological Association: [employer].ca Contact a health care provider if: Your grief is extreme and keeps getting worse. You have ongoing grief that does not improve. Your body shows symptoms of grief,  such as illness. You feel depressed, anxious, or hopeless. Get help right away if: You have thoughts about hurting yourself or others. Get help right away if you feel like you Sevin hurt yourself or others, or have thoughts about taking your own life. Go to your nearest  emergency room or: Call 911. Call the National Suicide Prevention Lifeline at 4233381633 or 988. This is open 24 hours a day. Text the Crisis Text Line at 2517221736. Summary Grief is the result of a major change or an absence of someone or something that you count on. Grief is a normal reaction to loss. The depth of grief and the period of recovery depend on the type of loss and your ability to adjust to the change and process your feelings. Processing grief requires patience and a willingness to accept your feelings and talk about your loss with people who are supportive. It is important to find resources that work for you and to realize that people experience grief differently. There is not one grieving process that works for everyone in the same way. Be aware that when grief becomes extreme, it can lead to more severe issues like isolation, depression, anxiety, or suicidal thoughts. Talk with your health care provider if you have any of these issues. This information is not intended to replace advice given to you by your health care provider. Make sure you discuss any questions you have with your health care provider. Document Revised: 12/25/2020 Document Reviewed: 12/25/2020 Elsevier Patient Education  2024 [employer].

## 2024-02-19 NOTE — Telephone Encounter (Signed)
 Patient left VM requesting Alan send her in tramadol , did not state what she needs it for. Need to call patient and find out what she needs tramadol  for.

## 2024-02-19 NOTE — Progress Notes (Signed)
 [facility] MD OP Progress Note  02/19/2024 9:09 AM Shelly Wright  MRN:  [id number]  Chief Complaint:  Chief Complaint  Patient presents with   Follow-up   Anxiety   Depression   Medication Refill   Discussed the use of AI scribe software for clinical note transcription with the patient, who gave verbal consent to proceed.  History of Present Illness Shelly Wright is a 53 year old Caucasian female on SSI, has a history of bipolar disorder, GAD, panic disorder, cannabis use disorder, tobacco use disorder, married, lives in [place] was evaluated in office today.  She has experienced significant grief following the recent death of a close friend in early September, and she notes that her friend's passing has been emotionally difficult. Additional distress arises from the death of his father shortly before, as well as ongoing family conflict and strained relationships with her relatives. Concern for her cousin, who is experiencing severe illness and pain, also contributes to her emotional burden, and she anticipates further impact from her cousin's declining health.  She has experienced disrupted sleep since her friend's death, with her waking around 1 or 2 a.m. to void and sometimes remaining awake for 2 to 3 hours before returning to sleep. She continues to take venlafaxine , buspirone , and Seroquel  as part of her current medication regimen.  She denies any side effects.  She denies any thoughts of hurting herself or others. She states that she does not currently see a therapist.   She reports ongoing tobacco use, stating she is still smoking. She reports current cannabis use, describing it as 'fine' when asked about it.  She denies any homicidality or perceptual disturbances.      Visit Diagnosis:    ICD-10-CM   1. Bipolar disorder, in full remission, most recent episode mixed  F31.78 QUEtiapine  (SEROQUEL ) 50 MG tablet    2. GAD (generalized anxiety disorder)  F41.1 busPIRone  (BUSPAR ) 30 MG  tablet    3. Panic disorder  F41.0 busPIRone  (BUSPAR ) 30 MG tablet    4. Insomnia due to medical condition  G47.01    Multifactorial including pain, mood symptoms    5. Cannabis use disorder, moderate, dependence (HCC)  F12.20     6. Bereavement  Z63.4 QUEtiapine  (SEROQUEL ) 50 MG tablet    7. Tobacco use disorder  F17.200    Severe    8. At risk for prolonged QT interval syndrome  Z91.89 EKG 12-Lead      Past Psychiatric History: I have reviewed past psychiatric history from progress note on 09/16/2018.  Past trials of venlafaxine , trazodone , Seroquel , hydroxyzine , Zoloft, Lexapro, duloxetine, Paxil, Abilify , Wellbutrin  Past Medical History:  Past Medical History:  Diagnosis Date   Abnormal EKG    Acute right-sided low back pain with right-sided sciatica 08/30/2014   Anxiety    Arthritis    Asthma    in past, no current inhalers   Bipolar disorder (HCC)    Breathlessness on exertion 04/20/2014   Cannabis use disorder    Cervical spinal cord compression (HCC) 05/07/2012   Chronic pain syndrome    COPD (chronic obstructive pulmonary disease) (HCC)    DDD (degenerative disc disease), lumbar    Depression    DOE (dyspnea on exertion)    Dysrhythmia    irregular-h/o in the past   Extreme obesity    Fibromyalgia    H/O syncope 04/25/2014   High risk medication use 06/23/2019   Hyperlipidemia    Hypertension    Hypokalemia  Hyponatremia 05/07/2021   Long term current use of anticoagulant therapy (Lovenox ) 05/06/2016   Lumbar canal stenosis    Marijuana smoker    Neuritis or radiculitis due to rupture of lumbar intervertebral disc 05/23/2014   Noncompliance with treatment regimen 05/01/2020   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   a.) presurgical PCR (+) 02/02/2019 prior to LEFT TKA; b.) presurgical PCR (+) 03/06/2019 prior to RIGHT REVERSE SHOULDER/BICEPS TENODESIS; c.) presurgical PCR (+) 01/17/2022 prior to LEFT SHOULDER ARTHROSCOPY/OPEN ROTATOR CUFF  REPAIR/SUBACROMIAL DECOMPRESSION/DISTAL CLAVICLE EXCISION/OPEN BICEPS TENODESIS   Partner relational problem 10/05/2021   Periprosthetic fracture around prosthetic knee 07/31/2015   Personal history of perinatal problems 04/25/2014   Pharmacologic therapy 11/30/2019   Pre-diabetes    Restless leg syndrome    Soft tissue lesion of shoulder region 01/24/2012   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   Syncope and collapse 04/08/2014   last was 2023   Thoracic and lumbosacral neuritis 05/07/2012   Thoracic neuritis 05/07/2012   Tobacco use disorder    Vitamin D  deficiency     Past Surgical History:  Procedure Laterality Date   ABDOMINAL HYSTERECTOMY     BICEPT TENODESIS Left 03/04/2023   Procedure: Left reverse shoulder arthroplasty, biceps tenodesis; open distal clavicle excision and AC joint cyst excision;  Surgeon: Tobie Priest, MD;  Location: [facility];  Service: Orthopedics;  Laterality: Left;   Cervical Fusion X 2     JOINT REPLACEMENT Right 01/2016   Dr Maryl   KNEE ARTHROPLASTY Left 02/10/2019   Procedure: COMPUTER ASSISTED TOTAL KNEE ARTHROPLASTY - RNFA;  Surgeon: Mardee Lynwood SQUIBB, MD;  Location: [facility];  Service: Orthopedics;  Laterality: Left;   KNEE ARTHROSCOPY Bilateral    Right knee scope 1998, Left knee scope   RESECTION DISTAL CLAVICAL Left 03/04/2023   Procedure: Left reverse shoulder arthroplasty, biceps tenodesis; open distal clavicle excision and AC joint cyst excision;  Surgeon: Tobie Priest, MD;  Location: [facility];  Service: Orthopedics;  Laterality: Left;   REVERSE SHOULDER ARTHROPLASTY Right 03/12/2021   Procedure: Right reverse shoulder arthroplasty, biceps tenodesis;  Surgeon: Tobie Priest, MD;  Location: [facility];  Service: Orthopedics;  Laterality: Right;   REVERSE SHOULDER ARTHROPLASTY Left 03/04/2023   Procedure: Left reverse shoulder arthroplasty, biceps tenodesis; open distal  clavicle excision and AC joint cyst excision;  Surgeon: Tobie Priest, MD;  Location: [facility];  Service: Orthopedics;  Laterality: Left;   SHOULDER ARTHROSCOPY WITH SUBACROMIAL DECOMPRESSION AND OPEN ROTATOR C Left 06/18/2021   Procedure: Left shoulder arthroscopy, Open rotator cuff repair, subacromial decompression, distal clavicle excision and, open biceps tenodesis;  Surgeon: Tobie Priest, MD;  Location: [facility];  Service: Orthopedics;  Laterality: Left;   TONSILLECTOMY     TOTAL KNEE ARTHROPLASTY Right 02/01/2015   Procedure: TOTAL KNEE ARTHROPLASTY;  Surgeon: Helayne Maryl, MD;  Location: [facility];  Service: Orthopedics;  Laterality: Right;   TUBAL LIGATION      Family Psychiatric History: I have reviewed family psychiatric history from progress note on 09/16/2018.  Family History:  Family History  Problem Relation Age of Onset   Breast cancer Mother 30   Colon cancer Father    Heart attack Father    Bipolar disorder Sister    Depression Sister    Schizophrenia Sister     Social History: I have reviewed social history from progress note on 09/16/2018. Social History   Socioeconomic History   Marital status: Married  Spouse name: Lamar   Number of children: 1   Years of education: Not on file   Highest education level: High school graduate  Occupational History   Not on file  Tobacco Use   Smoking status: Every Day    Current packs/day: 0.00    Average packs/day: 1 pack/day for 30.0 years (30.0 ttl pk-yrs)    Types: Cigarettes    Start date: 09/12/1991    Last attempt to quit: 09/11/2021    Years since quitting: 2.4   Smokeless tobacco: Never  Vaping Use   Vaping status: Never Used  Substance and Sexual Activity   Alcohol use: No   Drug use: Yes    Types: Marijuana    Comment: every day   Sexual activity: Not Currently  Other Topics Concern   Not on file  Social History Narrative   Not on file   Social Drivers of Health   Financial Resource Strain: Low  Risk  (11/26/2023)   Received from [facility]   Overall Financial Resource Strain (CARDIA)    Difficulty of Paying Living Expenses: Not hard at all  Recent Concern: Financial Resource Strain - High Risk (11/14/2023)   [facility]   Overall Financial Resource Strain (CARDIA)    Difficulty of Paying Living Expenses: Very hard  Food Insecurity: No Food Insecurity (11/26/2023)   Received from [facility]   Hunger Vital Sign    Within the past 12 months, you worried that your food would run out before you got the money to buy more.: Never true    Within the past 12 months, the food you bought just didn't last and you didn't have money to get more.: Never true  Recent Concern: Food Insecurity - Food Insecurity Present (11/14/2023)   Received from [facility]   Hunger Vital Sign    Within the past 12 months, you worried that your food would run out before you got the money to buy more.: Sometimes true    Within the past 12 months, the food you bought just didn't last and you didn't have money to get more.: Sometimes true  Transportation Needs: No Transportation Needs (11/26/2023)   Received from [facility] - Transportation    In the past 12 months, has lack of transportation kept you from medical appointments or from getting medications?: No    Lack of Transportation (Non-Medical): No  Physical Activity: Inactive (09/16/2018)   Exercise Vital Sign    Days of Exercise per Week: 0 days    Minutes of Exercise per Session: 0 min  Stress: Stress Concern Present (09/16/2018)   [employer] of Occupational Health - Occupational Stress Questionnaire    Feeling of Stress : To some extent  Social Connections: Unknown (09/16/2018)   Social Connection and Isolation Panel    Frequency of Communication with Friends and Family: Not on file    Frequency of Social Gatherings with Friends and Family:  Not on file    Attends Religious Services: Never    Active Member of Clubs or Organizations: No    Attends Banker Meetings: Never    Marital Status: Separated    Allergies: No Known Allergies  Metabolic Disorder Labs: Lab Results  Component Value Date   HGBA1C 5.5 12/05/2023   MPG 128.37 02/19/2023   MPG 131.24 09/26/2020   Lab Results  Component Value Date   PROLACTIN 6.4 09/26/2020  Lab Results  Component Value Date   CHOL 136 12/05/2023   TRIG 122 12/05/2023   HDL 44 12/05/2023   CHOLHDL 3.1 12/05/2023   VLDL 20 09/26/2020   LDLCALC 70 12/05/2023   LDLCALC 101 (H) 08/12/2023   Lab Results  Component Value Date   TSH 0.590 12/05/2023   TSH 0.602 08/12/2023    Therapeutic Level Labs: No results found for: LITHIUM No results found for: VALPROATE No results found for: CBMZ  Current Medications: Current Outpatient Medications  Medication Sig Dispense Refill   QUEtiapine  (SEROQUEL ) 50 MG tablet Take 1 tablet (50 mg total) by mouth at bedtime. Stop 25 mg 90 tablet 1   acetaminophen  (TYLENOL ) 500 MG tablet Take 1,000 mg by mouth daily.     amLODipine -benazepril  (LOTREL) 5-20 MG capsule Take 1 capsule by mouth daily. 90 capsule 1   busPIRone  (BUSPAR ) 30 MG tablet Take 1 tablet (30 mg total) by mouth 2 (two) times daily. 180 tablet 1   Crisaborole  (EUCRISA ) 2 % OINT Apply topically to affected areas 60 g 11   cyclobenzaprine  (FLEXERIL ) 10 MG tablet TAKE ONE TABLET BY MOUTH 3 TIMES DAILY AS NEEDED FOR MUSCLE SPASMS 90 tablet 0   gabapentin  (NEURONTIN ) 800 MG tablet Take 1 tablet (800 mg total) by mouth 3 (three) times daily. 90 tablet 5   GEMTESA  75 MG TABS TAKE ONE TABLET BY MOUTH EVERY DAY 30 tablet 5   HYDROcodone -acetaminophen  (NORCO/VICODIN) 5-325 MG tablet Take 1 tablet by mouth every 6 (six) hours as needed for moderate pain (pain score 4-6). (Patient not taking: Reported on 12/05/2023)     lidocaine  (XYLOCAINE ) 5 % ointment Apply 1 Application  topically as needed for moderate pain or mild pain. 35.44 g 5   loratadine  (CLARITIN ) 10 MG tablet Take 1 tablet (10 mg total) by mouth daily as needed for allergies. 90 tablet 3   Melatonin 5 MG CAPS Take 1 capsule (5 mg total) by mouth at bedtime.     meloxicam  (MOBIC ) 15 MG tablet TAKE ONE TABLET BY MOUTH ONCE DAILY AS NEEDED 90 tablet 1   Semaglutide -Weight Management (WEGOVY ) 1.7 MG/0.75ML SOAJ Inject 1.7 mg into the skin once a week. 3 mL 0   Semaglutide -Weight Management (WEGOVY ) 2.4 MG/0.75ML SOAJ Inject 2.4 mg into the skin once a week. 3 mL 3   simvastatin  (ZOCOR ) 20 MG tablet TAKE ONE TABLET BY MOUTH AT BEDTIME 90 tablet 1   traMADol  (ULTRAM ) 50 MG tablet Take 1 tablet (50 mg total) by mouth every 6 (six) hours as needed. 120 tablet 2   traZODone  (DESYREL ) 100 MG tablet TAKE 2 TABLETS BY MOUTH AT BEDTIME AS NEEDED FOR SLEEP 180 tablet 1   venlafaxine  XR (EFFEXOR -XR) 150 MG 24 hr capsule TAKE 1 CAPSULE BY MOUTH ONCE DAILY WITH BREAKFAST TAKE ALONG WITH 75 MG 90 capsule 3   venlafaxine  XR (EFFEXOR -XR) 75 MG 24 hr capsule TAKE 1 CAPSULE BY MOUTH ONCE DAILY WITH BREAKFAST ALONG WITH 150 MG 90 capsule 1   VENTOLIN  HFA 108 (90 Base) MCG/ACT inhaler INHALE 2 PUFFS INTO LUNGS EVERY 6 HOURS AS NEEDED FOR WHEEZING OR SHORTNESS OF BREATH 18 g 5   No current facility-administered medications for this visit.     Musculoskeletal: Strength & Muscle Tone: within normal limits Gait & Station: walks with a cane Patient leans: N/A  Psychiatric Specialty Exam: Review of Systems  Psychiatric/Behavioral:  Positive for sleep disturbance.        Grieving    Blood pressure ROLLEN)  149/79, pulse 86, temperature (!) 97.5 F (36.4 C), temperature source Temporal, height 5' 6 (1.676 m), weight 218 lb (98.9 kg).Body mass index is 35.19 kg/m.  General Appearance: Casual  Eye Contact:  Fair  Speech:  Clear and Coherent  Volume:  Normal  Mood:  Grieving  Affect:  Tearful  Thought Process:  Goal Directed  and Descriptions of Associations: Intact  Orientation:  Full (Time, Place, and Person)  Thought Content: Logical   Suicidal Thoughts:  No  Homicidal Thoughts:  No  Memory:  Immediate;   Fair Recent;   Fair Remote;   Fair  Judgement:  Fair  Insight:  Fair  Psychomotor Activity:  Normal  Concentration:  Concentration: Fair and Attention Span: Fair  Recall:  [employer] of Knowledge: Fair  Language: Fair  Akathisia:  No  Handed:  Right  AIMS (if indicated): done  Assets:  Communication Skills Desire for Improvement Housing Resilience Talents/Skills  ADL's:  Intact  Cognition: WNL  Sleep:  Poor   Screenings: Geneticist, molecular Office Visit from 02/19/2024 in [facility] Office Visit from 11/19/2023 in [facility] Office Visit from 01/07/2022 in [facility] Office Visit from 10/05/2021 in [facility] Office Visit from 11/27/2020 in [facility]  AIMS Total Score 0 0 0 0 0   GAD-7    Flowsheet Row Office Visit from 11/19/2023 in [facility] Office Visit from 03/10/2023 in [facility] Office Visit from 09/10/2022 in [facility] Office Visit from 01/07/2022 in [facility] Office Visit from 08/14/2021 in [facility]  Total GAD-7 Score 3 10 2 4  0   PHQ2-9    Flowsheet Row Office Visit from 11/19/2023 in [facility] Office Visit from 03/10/2023 in [facility] Office Visit from 09/10/2022 in [facility] Office Visit from 01/07/2022 in [facility] Office Visit from 10/05/2021 in [facility]  PHQ-2 Total Score 1 3 0 1 2  PHQ-9 Total Score -- 7 2 -- 4   Flowsheet Row Office Visit from 02/19/2024 in [facility] Office Visit from 11/19/2023 in [facility] Video Visit from 08/27/2023 in [facility]  C-SSRS RISK CATEGORY No Risk No Risk No Risk     Assessment and Plan: Shelly Wright is a 53 year old Caucasian female, has a history of bipolar disorder, GAD, panic disorder was evaluated in office today.  Discussed assessment and plan as noted below.  1. Bipolar disorder, in full remission, most recent episode mixed Currently denies any significant mood swings or depression or mania although does have grief reaction.  Does have sleep problems due to recent grief and interested in dosage increase of Seroquel . Continue Venlafaxine  extended release 225 mg daily Continue BuSpar  30 mg twice daily Increase Seroquel  to 50 mg at bedtime Continue Gabapentin  800 mg 4 times a day prescribed for pain  2. GAD (generalized anxiety disorder)-stable Currently denies any significant anxiety symptoms other than grief reaction Continue Venlafaxine  and BuSpar  as prescribed  3. Panic disorder-stable Denies any recent panic attacks Continue Venlafaxine  and BuSpar  as prescribed  4. Cannabis use disorder, moderate,  dependence (HCC)-unstable Continues to smoke cannabis and is not interested in quitting.  5. Bereavement-unstable Currently struggling with grief. Increase Seroquel  to 50 mg at bedtime Continue Trazodone  200 mg at bedtime Continue Melatonin 10 mg at bedtime  6. Tobacco use disorder, severe-unstable Continues to smoke cigarettes.  Not interested in quitting.  7. At risk for prolonged QT interval syndrome Will order EKG-patient to call [phone] since Seroquel  dosage was  uptitrated.  Discussed referral for CBT/grief counseling, provided information for hospice/authora care.  Patient not interested.  Patient to monitor blood pressure which was elevated in session today and follow-up with primary care provider.  Follow-up Follow-up in clinic in 6 to 8 weeks or sooner if needed. Collaboration of Care: Collaboration of Care: Patient refused AEB referral for CBT  Patient/Guardian was advised Release of Information must be obtained prior to any record release in order to collaborate their care with an outside provider. Patient/Guardian was advised if they have not already done so to contact the registration department to sign all necessary forms in order for us  to release information regarding their care.   Consent: Patient/Guardian gives verbal consent for treatment and assignment of benefits for services provided during this visit. Patient/Guardian expressed understanding and agreed to proceed.  This note was generated in part or whole with voice recognition software. Voice recognition is usually quite accurate but there are transcription errors that can and very often do occur. I apologize for any typographical errors that were not detected and corrected.     Monzerrath Mcburney, MD 02/20/2024, 7:58 AM

## 2024-02-23 ENCOUNTER — Other Ambulatory Visit: Payer: Self-pay

## 2024-02-23 NOTE — Telephone Encounter (Signed)
 Per Shelly Wright verbal pt  should still be able to get the medication since she has other comorbidities that will help to get it approved for her

## 2024-02-24 NOTE — Telephone Encounter (Signed)
 Called patient and patient stated that she had already gotten this one taken care of

## 2024-03-05 ENCOUNTER — Other Ambulatory Visit: Payer: Self-pay | Admitting: Family

## 2024-03-05 DIAGNOSIS — N3946 Mixed incontinence: Secondary | ICD-10-CM

## 2024-03-29 ENCOUNTER — Other Ambulatory Visit: Payer: Self-pay | Admitting: Cardiology

## 2024-03-29 ENCOUNTER — Other Ambulatory Visit: Payer: Self-pay | Admitting: Psychiatry

## 2024-03-29 DIAGNOSIS — F411 Generalized anxiety disorder: Secondary | ICD-10-CM

## 2024-03-29 DIAGNOSIS — F3178 Bipolar disorder, in full remission, most recent episode mixed: Secondary | ICD-10-CM

## 2024-04-02 ENCOUNTER — Other Ambulatory Visit: Payer: Self-pay | Admitting: Family

## 2024-04-02 DIAGNOSIS — M792 Neuralgia and neuritis, unspecified: Secondary | ICD-10-CM

## 2024-04-02 DIAGNOSIS — M797 Fibromyalgia: Secondary | ICD-10-CM

## 2024-04-06 ENCOUNTER — Ambulatory Visit: Admitting: Family

## 2024-04-06 ENCOUNTER — Encounter: Payer: Self-pay | Admitting: Psychiatry

## 2024-04-06 ENCOUNTER — Encounter: Payer: Self-pay | Admitting: Family

## 2024-04-06 ENCOUNTER — Ambulatory Visit
Admission: RE | Admit: 2024-04-06 | Discharge: 2024-04-06 | Disposition: A | Discharge status: Home / Self Care | Attending: Family | Admitting: Family

## 2024-04-06 ENCOUNTER — Telehealth (INDEPENDENT_AMBULATORY_CARE_PROVIDER_SITE_OTHER): Admitting: Psychiatry

## 2024-04-06 ENCOUNTER — Ambulatory Visit
Admission: RE | Admit: 2024-04-06 | Discharge: 2024-04-06 | Disposition: A | Source: Ambulatory Visit | Attending: Family | Admitting: Family

## 2024-04-06 VITALS — BP 112/70 | HR 88 | Ht 66.0 in | Wt 217.0 lb

## 2024-04-06 DIAGNOSIS — M25532 Pain in left wrist: Secondary | ICD-10-CM

## 2024-04-06 DIAGNOSIS — F3178 Bipolar disorder, in full remission, most recent episode mixed: Secondary | ICD-10-CM

## 2024-04-06 DIAGNOSIS — M48062 Spinal stenosis, lumbar region with neurogenic claudication: Secondary | ICD-10-CM

## 2024-04-06 DIAGNOSIS — F1220 Cannabis dependence, uncomplicated: Secondary | ICD-10-CM

## 2024-04-06 DIAGNOSIS — E559 Vitamin D deficiency, unspecified: Secondary | ICD-10-CM

## 2024-04-06 DIAGNOSIS — Z634 Disappearance and death of family member: Secondary | ICD-10-CM

## 2024-04-06 DIAGNOSIS — F410 Panic disorder [episodic paroxysmal anxiety] without agoraphobia: Secondary | ICD-10-CM

## 2024-04-06 DIAGNOSIS — I10 Essential (primary) hypertension: Secondary | ICD-10-CM

## 2024-04-06 DIAGNOSIS — R5383 Other fatigue: Secondary | ICD-10-CM

## 2024-04-06 DIAGNOSIS — M51362 Other intervertebral disc degeneration, lumbar region with discogenic back pain and lower extremity pain: Secondary | ICD-10-CM

## 2024-04-06 DIAGNOSIS — G4701 Insomnia due to medical condition: Secondary | ICD-10-CM

## 2024-04-06 DIAGNOSIS — M47816 Spondylosis without myelopathy or radiculopathy, lumbar region: Secondary | ICD-10-CM

## 2024-04-06 DIAGNOSIS — E782 Mixed hyperlipidemia: Secondary | ICD-10-CM | POA: Diagnosis not present

## 2024-04-06 DIAGNOSIS — N3946 Mixed incontinence: Secondary | ICD-10-CM

## 2024-04-06 DIAGNOSIS — R52 Pain, unspecified: Secondary | ICD-10-CM

## 2024-04-06 DIAGNOSIS — F17210 Nicotine dependence, cigarettes, uncomplicated: Secondary | ICD-10-CM

## 2024-04-06 DIAGNOSIS — F411 Generalized anxiety disorder: Secondary | ICD-10-CM

## 2024-04-06 DIAGNOSIS — E538 Deficiency of other specified B group vitamins: Secondary | ICD-10-CM

## 2024-04-06 DIAGNOSIS — R7303 Prediabetes: Secondary | ICD-10-CM

## 2024-04-06 DIAGNOSIS — M797 Fibromyalgia: Secondary | ICD-10-CM

## 2024-04-06 DIAGNOSIS — F17200 Nicotine dependence, unspecified, uncomplicated: Secondary | ICD-10-CM

## 2024-04-06 DIAGNOSIS — Z1231 Encounter for screening mammogram for malignant neoplasm of breast: Secondary | ICD-10-CM

## 2024-04-06 NOTE — Assessment & Plan Note (Signed)
.  A1C Continues to be in prediabetic ranges.  Will reassess at follow up after next lab check.  Patient counseled on dietary choices and verbalized understanding.   -CBC w/Diff -CMP w/eGFR -Hemoglobin A1C

## 2024-04-06 NOTE — Assessment & Plan Note (Signed)
Patient is seen by Neurosurgery, who manage this condition.  She is well controlled with current therapy.   Will defer to them for further changes to plan of care.

## 2024-04-06 NOTE — Progress Notes (Signed)
 Virtual Visit via Video Note  I connected with Shelly Wright on 04/06/24 at 10:30 AM EST by a video enabled telemedicine application and verified that I am speaking with the correct person using two identifiers.  Location Provider Location : [facility] Patient Location : Car  Participants: Patient , Provider    I discussed the limitations of evaluation and management by telemedicine and the availability of in person appointments. The patient expressed understanding and agreed to proceed.   I discussed the assessment and treatment plan with the patient. The patient was provided an opportunity to ask questions and all were answered. The patient agreed with the plan and demonstrated an understanding of the instructions.   The patient was advised to call back or seek an in-person evaluation if the symptoms worsen or if the condition fails to improve as anticipated.                                                                                    BH MD OP Progress Note  04/06/2024 10:43 AM Shelly Wright  MRN:  [id number]  Chief Complaint:  Chief Complaint  Patient presents with   Follow-up   Anxiety   Depression   Medication Refill   Discussed the use of AI scribe software for clinical note transcription with the patient, who gave verbal consent to proceed.  History of Present Illness Shelly Wright is a 53 year old Caucasian female on SSI, has a history of bipolar disorder, GAD, panic disorder, cannabis use disorder, tobacco use disorder, married, lives in [place] was evaluated by telemedicine today.  She reports her mood remains stable, and she denies experiencing mood swings, mania, or depressive symptoms. While coping with grief related to recent losses, she notes that some days feel more challenging than others but overall feels she manages better than before. She prefers to rely on her own support system and does not wish to engage with a therapist or grief counselor.  She reports  her sleep quality has improved, and she describes sleeping through the night . She denies any thoughts of harming herself or others.  She continues to use her current medications, including buspirone  30 mg twice daily, Seroquel  50 mg at bedtime, trazodone  200 mg  at bedtime as needed, venlafaxine  (Effexor ) 150 mg and 75 mg, and gabapentin  . She confirms no recent changes to her medication regimen and reports tolerating her medications well.  She reports ongoing psychosocial stressors include her father's declining health and the possibility of relocation due to his care needs, as well as concern for her cousin who is struggling with terminal illness.  She reports possibly moving to a new residence due to changes in her partner's father's care needs.     Visit Diagnosis:    ICD-10-CM   1. Bipolar disorder, in full remission, most recent episode mixed  F31.78     2. GAD (generalized anxiety disorder)  F41.1     3. Panic disorder  F41.0     4. Insomnia due to medical condition  G47.01    Multifactorial including pain, mood symptoms    5. Bereavement  Z63.4     6. Cannabis use  disorder, moderate, dependence (HCC)  F12.20     7. Tobacco use disorder  F17.200    Severe      Past Psychiatric History: I have reviewed past psychiatric history from progress note on 09/16/2018.  Past trials of venlafaxine , trazodone , Seroquel , hydroxyzine , Zoloft, Lexapro, duloxetine, Paxil, Abilify , Wellbutrin  Past Medical History:  Past Medical History:  Diagnosis Date   Abnormal EKG    Acute right-sided low back pain with right-sided sciatica 08/30/2014   Anxiety    Arthritis    Asthma    in past, no current inhalers   Bipolar disorder (HCC)    Breathlessness on exertion 04/20/2014   Cannabis use disorder    Cervical spinal cord compression (HCC) 05/07/2012   Chronic pain syndrome    COPD (chronic obstructive pulmonary disease) (HCC)    DDD (degenerative disc disease), lumbar    Depression     DOE (dyspnea on exertion)    Dysrhythmia    irregular-h/o in the past   Extreme obesity    Fibromyalgia    H/O syncope 04/25/2014   High risk medication use 06/23/2019   Hyperlipidemia    Hypertension    Hypokalemia    Hyponatremia 05/07/2021   Long term current use of anticoagulant therapy (Lovenox ) 05/06/2016   Lumbar canal stenosis    Marijuana smoker    Neuritis or radiculitis due to rupture of lumbar intervertebral disc 05/23/2014   Noncompliance with treatment regimen 05/01/2020   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   a.) presurgical PCR (+) 02/02/2019 prior to LEFT TKA; b.) presurgical PCR (+) 03/06/2019 prior to RIGHT REVERSE SHOULDER/BICEPS TENODESIS; c.) presurgical PCR (+) 01/17/2022 prior to LEFT SHOULDER ARTHROSCOPY/OPEN ROTATOR CUFF REPAIR/SUBACROMIAL DECOMPRESSION/DISTAL CLAVICLE EXCISION/OPEN BICEPS TENODESIS   Partner relational problem 10/05/2021   Periprosthetic fracture around prosthetic knee 07/31/2015   Personal history of perinatal problems 04/25/2014   Pharmacologic therapy 11/30/2019   Pre-diabetes    Restless leg syndrome    Soft tissue lesion of shoulder region 01/24/2012   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   Syncope and collapse 04/08/2014   last was 2023   Thoracic and lumbosacral neuritis 05/07/2012   Thoracic neuritis 05/07/2012   Tobacco use disorder    Vitamin D  deficiency     Past Surgical History:  Procedure Laterality Date   ABDOMINAL HYSTERECTOMY     BICEPT TENODESIS Left 03/04/2023   Procedure: Left reverse shoulder arthroplasty, biceps tenodesis; open distal clavicle excision and AC joint cyst excision;  Surgeon: Tobie Priest, MD;  Location: [facility];  Service: Orthopedics;  Laterality: Left;   Cervical Fusion X 2     JOINT REPLACEMENT Right 01/2016   Dr Maryl   KNEE ARTHROPLASTY Left 02/10/2019   Procedure: COMPUTER ASSISTED TOTAL KNEE ARTHROPLASTY -  RNFA;  Surgeon: Mardee Lynwood SQUIBB, MD;  Location: [facility];  Service: Orthopedics;  Laterality: Left;   KNEE ARTHROSCOPY Bilateral    Right knee scope 1998, Left knee scope   RESECTION DISTAL CLAVICAL Left 03/04/2023   Procedure: Left reverse shoulder arthroplasty, biceps tenodesis; open distal clavicle excision and AC joint cyst excision;  Surgeon: Tobie Priest, MD;  Location: [facility];  Service: Orthopedics;  Laterality: Left;   REVERSE SHOULDER ARTHROPLASTY Right 03/12/2021   Procedure: Right reverse shoulder arthroplasty, biceps tenodesis;  Surgeon: Tobie Priest, MD;  Location: [facility];  Service: Orthopedics;  Laterality: Right;   REVERSE SHOULDER ARTHROPLASTY Left 03/04/2023   Procedure: Left  reverse shoulder arthroplasty, biceps tenodesis; open distal clavicle excision and AC joint cyst excision;  Surgeon: Tobie Priest, MD;  Location: [facility];  Service: Orthopedics;  Laterality: Left;   SHOULDER ARTHROSCOPY WITH SUBACROMIAL DECOMPRESSION AND OPEN ROTATOR C Left 06/18/2021   Procedure: Left shoulder arthroscopy, Open rotator cuff repair, subacromial decompression, distal clavicle excision and, open biceps tenodesis;  Surgeon: Tobie Priest, MD;  Location: [facility];  Service: Orthopedics;  Laterality: Left;   TONSILLECTOMY     TOTAL KNEE ARTHROPLASTY Right 02/01/2015   Procedure: TOTAL KNEE ARTHROPLASTY;  Surgeon: Helayne Glenn, MD;  Location: [facility];  Service: Orthopedics;  Laterality: Right;   TUBAL LIGATION      Family Psychiatric History: I have reviewed family psychiatric history from progress note on 09/16/2018.  Family History:  Family History  Problem Relation Age of Onset   Breast cancer Mother 25   Colon cancer Father    Heart attack Father    Bipolar disorder Sister    Depression Sister    Schizophrenia Sister     Social History: I have reviewed social history from progress note on 09/16/2018 Social History   Socioeconomic History   Marital status: Married    Spouse  name: Lamar   Number of children: 1   Years of education: Not on file   Highest education level: High school graduate  Occupational History   Not on file  Tobacco Use   Smoking status: Former    Current packs/day: 0.00    Average packs/day: 1 pack/day for 30.0 years (30.0 ttl pk-yrs)    Types: Cigarettes    Start date: 09/12/1991    Quit date: 09/11/2021    Years since quitting: 2.5   Smokeless tobacco: Never  Vaping Use   Vaping status: Never Used  Substance and Sexual Activity   Alcohol use: No   Drug use: Yes    Types: Marijuana    Comment: every day   Sexual activity: Not Currently  Other Topics Concern   Not on file  Social History Narrative   Not on file   Social Drivers of Health   Financial Resource Strain: Low Risk  (11/26/2023)   Received from [facility]   Overall Financial Resource Strain (CARDIA)    Difficulty of Paying Living Expenses: Not hard at all  Recent Concern: Financial Resource Strain - High Risk (11/14/2023)   Received from [facility]   Overall Financial Resource Strain (CARDIA)    Difficulty of Paying Living Expenses: Very hard  Food Insecurity: No Food Insecurity (11/26/2023)   Received from [facility]   Hunger Vital Sign    Within the past 12 months, you worried that your food would run out before you got the money to buy more.: Never true    Within the past 12 months, the food you bought just didn't last and you didn't have money to get more.: Never true  Recent Concern: Food Insecurity - Food Insecurity Present (11/14/2023)   Received from [facility]   Hunger Vital Sign    Within the past 12 months, you worried that your food would run out before you got the money to buy more.: Sometimes true    Within the past 12 months, the food you bought just didn't last and you didn't have money to get more.: Sometimes true  Transportation Needs: No Transportation Needs (11/26/2023)    Received from [facility]   [facility] - Transportation  In the past 12 months, has lack of transportation kept you from medical appointments or from getting medications?: No    Lack of Transportation (Non-Medical): No  Physical Activity: Inactive (09/16/2018)   Exercise Vital Sign    Days of Exercise per Week: 0 days    Minutes of Exercise per Session: 0 min  Stress: Stress Concern Present (09/16/2018)   [employer] [facility] [employer] - Occupational Stress Questionnaire    Feeling of Stress : To some extent  Social Connections: Unknown (09/16/2018)   Social Connection and Isolation Panel    Frequency of Communication with Friends and Family: Not on file    Frequency of Social Gatherings with Friends and Family: Not on file    Attends Religious Services: Never    Active Member of Clubs or Organizations: No    Attends Engineer, Structural: Never    Marital Status: Separated    Allergies: No Known Allergies  Metabolic Disorder Labs: Lab Results  Component Value Date   HGBA1C 5.8 (H) 04/06/2024   MPG 128.37 02/19/2023   MPG 131.24 09/26/2020   Lab Results  Component Value Date   PROLACTIN 6.4 09/26/2020   Lab Results  Component Value Date   CHOL 135 04/06/2024   TRIG 93 04/06/2024   HDL 46 04/06/2024   CHOLHDL 2.9 04/06/2024   VLDL 20 09/26/2020   LDLCALC 71 04/06/2024   LDLCALC 70 12/05/2023   Lab Results  Component Value Date   TSH 0.506 04/06/2024   TSH 0.590 12/05/2023    Therapeutic Level Labs: No results found for: LITHIUM No results found for: VALPROATE No results found for: CBMZ  Current Medications: Current Outpatient Medications  Medication Sig Dispense Refill   acetaminophen  (TYLENOL ) 500 MG tablet Take 1,000 mg by mouth daily.     amLODipine -benazepril  (LOTREL) 5-20 MG capsule TAKE 1 CAPSULE BY MOUTH ONCE DAILY 90 capsule 1   busPIRone  (BUSPAR ) 30 MG tablet Take 1 tablet (30 mg total) by mouth 2 (two) times  daily. 180 tablet 1   Crisaborole  (EUCRISA ) 2 % OINT Apply topically to affected areas 60 g 11   cyclobenzaprine  (FLEXERIL ) 10 MG tablet TAKE ONE TABLET BY MOUTH 3 TIMES DAILY AS NEEDED FOR MUSCLE SPASMS 90 tablet 0   gabapentin  (NEURONTIN ) 800 MG tablet TAKE 1 TABLET BY MOUTH THREE TIMES DAILY 90 tablet 5   GEMTESA  75 MG TABS TAKE ONE TABLET BY MOUTH ONCE DAILY 30 tablet 5   HYDROcodone -acetaminophen  (NORCO/VICODIN) 5-325 MG tablet Take 1 tablet by mouth every 6 (six) hours as needed for moderate pain (pain score 4-6). (Patient not taking: Reported on 04/06/2024)     lidocaine  (XYLOCAINE ) 5 % ointment Apply 1 Application topically as needed for moderate pain or mild pain. 35.44 g 5   loratadine  (CLARITIN ) 10 MG tablet Take 1 tablet (10 mg total) by mouth daily as needed for allergies. 90 tablet 3   Melatonin 5 MG CAPS Take 1 capsule (5 mg total) by mouth at bedtime.     meloxicam  (MOBIC ) 15 MG tablet TAKE ONE TABLET BY MOUTH ONCE DAILY AS NEEDED 90 tablet 1   QUEtiapine  (SEROQUEL ) 50 MG tablet Take 1 tablet (50 mg total) by mouth at bedtime. Stop 25 mg 90 tablet 1   Semaglutide -Weight Management (WEGOVY ) 1.7 MG/0.75ML SOAJ Inject 1.7 mg into the skin once a week. (Patient not taking: Reported on 04/06/2024) 3 mL 0   Semaglutide -Weight Management (WEGOVY ) 2.4 MG/0.75ML SOAJ Inject 2.4 mg into the skin once a  week. (Patient not taking: Reported on 04/06/2024) 3 mL 3   simvastatin  (ZOCOR ) 20 MG tablet TAKE ONE TABLET BY MOUTH AT BEDTIME 90 tablet 1   traMADol  (ULTRAM ) 50 MG tablet Take 1 tablet (50 mg total) by mouth every 6 (six) hours as needed. 120 tablet 2   traZODone  (DESYREL ) 100 MG tablet TAKE 2 TABLETS BY MOUTH AT BEDTIME AS NEEDED FOR SLEEP 180 tablet 1   venlafaxine  XR (EFFEXOR -XR) 150 MG 24 hr capsule TAKE 1 CAPSULE BY MOUTH ONCE DAILY WITH BREAKFAST TAKE ALONG WITH 75 MG 90 capsule 3   venlafaxine  XR (EFFEXOR -XR) 75 MG 24 hr capsule TAKE 1 CAPSULE BY MOUTH ONCE DAILY WITH BREAKFAST ALONG  WITH 150 MG 90 capsule 1   VENTOLIN  HFA 108 (90 Base) MCG/ACT inhaler INHALE 2 PUFFS INTO LUNGS EVERY 6 HOURS AS NEEDED FOR WHEEZING OR SHORTNESS OF BREATH 18 g 5   No current facility-administered medications for this visit.     Musculoskeletal: Strength & Muscle Tone: UTA Gait & Station: Seated Patient leans: N/A  Psychiatric Specialty Exam: Review of Systems  Psychiatric/Behavioral:         Grieving    There were no vitals taken for this visit.There is no height or weight on file to calculate BMI.  General Appearance: Casual  Eye Contact:  Fair  Speech:  Clear and Coherent  Volume:  Normal  Mood:  Grieving  Affect:  Tearful  Thought Process:  Goal Directed and Descriptions of Associations: Intact  Orientation:  Full (Time, Place, and Person)  Thought Content: Logical   Suicidal Thoughts:  No  Homicidal Thoughts:  No  Memory:  Immediate;   Fair Recent;   Fair Remote;   Fair  Judgement:  Fair  Insight:  Fair  Psychomotor Activity:  Normal  Concentration:  Concentration: Fair and Attention Span: Fair  Recall:  [employer] of Knowledge: Fair  Language: Fair  Akathisia:  No  Handed:  Right  AIMS (if indicated): not done  Assets:  Manufacturing Systems Engineer Desire for Improvement Housing Social Support Transportation  ADL's:  Intact  Cognition: WNL  Sleep:  Fair   Screenings: Midwife Visit from 02/19/2024 in [facility] Office Visit from 11/19/2023 in [facility] Office Visit from 01/07/2022 in [facility] Office Visit from 10/05/2021 in [facility] Office Visit from 11/27/2020 in [facility]  AIMS Total Score 0 0 0 0 0   GAD-7    Flowsheet Row Office Visit from 11/19/2023 in [facility] Office Visit from  03/10/2023 in [facility] Office Visit from 09/10/2022 in [facility] Office Visit from 01/07/2022 in [facility] Office Visit from 08/14/2021 in [facility]  Total GAD-7 Score 3 10 2 4  0   PHQ2-9    Flowsheet Row Office Visit from 11/19/2023 in [facility] Office Visit from 03/10/2023 in [facility] Office Visit from 09/10/2022 in [facility] Office Visit from 01/07/2022 in [facility] Office Visit from 10/05/2021 in [facility]  PHQ-2 Total Score 1 3 0 1 2  PHQ-9 Total Score -- 7 2 -- 4   Flowsheet Row Video Visit from  04/06/2024 in [facility] Office Visit from 02/19/2024 in [facility] Office Visit from 11/19/2023 in [facility]  C-SSRS RISK CATEGORY No Risk No Risk No Risk     Assessment and Plan: Ciji Boston Want is a 53 year old Caucasian female who presented for a follow-up appointment.  Discussed assessment and plan as noted below.  1. Bipolar disorder, in full remission, most recent episode mixed Currently denies any significant mood swings, depression or mania. Continue Venlafaxine  extended release 225 mg daily Continue BuSpar  30 mg twice daily Continue Gabapentin  800 mg 4 times a day prescribed for pain which is also a mood stabilizer Continue Seroquel  50 mg at bedtime  2. GAD (generalized anxiety disorder)-stable Denies any significant anxiety other than grief reaction Continue Venlafaxine  and BuSpar  as prescribed  3. Panic disorder-stable Denies any recent panic attacks and reports current medications  beneficial Continue Venlafaxine  and BuSpar  as prescribed  4. Insomnia due to medical condition-stable Current reports sleep is overall good. Continue Trazodone  200 mg at bedtime as needed Continue Melatonin low dosage as needed at bedtime Continue sleep hygiene techniques  5. Cannabis use disorder, moderate, dependence (HCC)-unstable Not interested in quitting.  6. Bereavement-improving Currently reports coping better. Patient has resources available to start grief counseling.  Encouraged to do so  7. Tobacco use disorder-unstable Not interested in quitting  Follow-up Follow-up in clinic in 2 to 3 months or sooner if needed.    Collaboration of Care: Collaboration of Care: Patient refused AEB patient refused referral for psychotherapy  Patient/Guardian was advised Release of Information must be obtained prior to any record release in order to collaborate their care with an outside provider. Patient/Guardian was advised if they have not already done so to contact the registration department to sign all necessary forms in order for us  to release information regarding their care.   Consent: Patient/Guardian gives verbal consent for treatment and assignment of benefits for services provided during this visit. Patient/Guardian expressed understanding and agreed to proceed.   This note was generated in part or whole with voice recognition software. Voice recognition is usually quite accurate but there are transcription errors that can and very often do occur. I apologize for any typographical errors that were not detected and corrected.    Vashaun Osmon, MD 04/07/2024, 7:10 AM

## 2024-04-06 NOTE — Assessment & Plan Note (Signed)
 Patient stable.  Well controlled with current therapy.   Continue current meds.

## 2024-04-06 NOTE — Patient Instructions (Signed)
 [facility] [address], [address] [place],  [place]  [address]  Main: [phone]

## 2024-04-06 NOTE — Assessment & Plan Note (Signed)
 Checking labs today.  Continue current therapy for lipid control. Will modify as needed based on labwork results.   -CMP w/eGFR -Lipid Panel

## 2024-04-06 NOTE — Progress Notes (Signed)
 Established Patient Office Visit  Subjective:  Patient ID: Shelly Wright, female    DOB: [birth year]  Age: 53 y.o. MRN: [id number]  Chief Complaint  Patient presents with   Follow-up    4 month follow up    Patient is here today for her 4 months follow up.  She has been feeling fairly well since last appointment.   She does have additional concerns to discuss today.  - Reports wrist injury yesterday while getting into truck, mechanism unclear, possibly twisted - Describes pain in left wrist area - Reports caring for father with Parkinson's disease for 2.5 years, now 24-hour care, considering nursing home placement - Declines flu vaccination - Requests mammogram, last done over 1 year ago in [place]   Labs are due today.  She needs refills.   I have reviewed her active problem list, medication list, allergies, notes from last encounter, lab results for her appointment today.      No other concerns at this time.   Past Medical History:  Diagnosis Date   Abnormal EKG    Acute right-sided low back pain with right-sided sciatica 08/30/2014   Anxiety    Arthritis    Asthma    in past, no current inhalers   Bipolar disorder (HCC)    Breathlessness on exertion 04/20/2014   Cannabis use disorder    Cervical spinal cord compression (HCC) 05/07/2012   Chronic pain syndrome    COPD (chronic obstructive pulmonary disease) (HCC)    DDD (degenerative disc disease), lumbar    Depression    DOE (dyspnea on exertion)    Dysrhythmia    irregular-h/o in the past   Extreme obesity    Fibromyalgia    H/O syncope 04/25/2014   High risk medication use 06/23/2019   Hyperlipidemia    Hypertension    Hypokalemia    Hyponatremia 05/07/2021   Long term current use of anticoagulant therapy (Lovenox ) 05/06/2016   Lumbar canal stenosis    Marijuana smoker    Neuritis or radiculitis due to rupture of lumbar intervertebral disc 05/23/2014   Noncompliance with treatment regimen 05/01/2020    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   a.) presurgical PCR (+) 02/02/2019 prior to LEFT TKA; b.) presurgical PCR (+) 03/06/2019 prior to RIGHT REVERSE SHOULDER/BICEPS TENODESIS; c.) presurgical PCR (+) 01/17/2022 prior to LEFT SHOULDER ARTHROSCOPY/OPEN ROTATOR CUFF REPAIR/SUBACROMIAL DECOMPRESSION/DISTAL CLAVICLE EXCISION/OPEN BICEPS TENODESIS   Partner relational problem 10/05/2021   Periprosthetic fracture around prosthetic knee 07/31/2015   Personal history of perinatal problems 04/25/2014   Pharmacologic therapy 11/30/2019   Pre-diabetes    Restless leg syndrome    Soft tissue lesion of shoulder region 01/24/2012   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   Syncope and collapse 04/08/2014   last was 2023   Thoracic and lumbosacral neuritis 05/07/2012   Thoracic neuritis 05/07/2012   Tobacco use disorder    Vitamin D  deficiency     Past Surgical History:  Procedure Laterality Date   ABDOMINAL HYSTERECTOMY     BICEPT TENODESIS Left 03/04/2023   Procedure: Left reverse shoulder arthroplasty, biceps tenodesis; open distal clavicle excision and AC joint cyst excision;  Surgeon: Tobie Priest, MD;  Location: [facility];  Service: Orthopedics;  Laterality: Left;   Cervical Fusion X 2     JOINT REPLACEMENT Right 01/2016   Dr Maryl   KNEE ARTHROPLASTY Left 02/10/2019   Procedure: COMPUTER ASSISTED TOTAL KNEE ARTHROPLASTY -  RNFA;  Surgeon: Mardee Lynwood SQUIBB, MD;  Location: [facility];  Service: Orthopedics;  Laterality: Left;   KNEE ARTHROSCOPY Bilateral    Right knee scope 1998, Left knee scope   RESECTION DISTAL CLAVICAL Left 03/04/2023   Procedure: Left reverse shoulder arthroplasty, biceps tenodesis; open distal clavicle excision and AC joint cyst excision;  Surgeon: Tobie Priest, MD;  Location: [facility];  Service: Orthopedics;  Laterality: Left;   REVERSE SHOULDER ARTHROPLASTY Right 03/12/2021   Procedure: Right  reverse shoulder arthroplasty, biceps tenodesis;  Surgeon: Tobie Priest, MD;  Location: [facility];  Service: Orthopedics;  Laterality: Right;   REVERSE SHOULDER ARTHROPLASTY Left 03/04/2023   Procedure: Left reverse shoulder arthroplasty, biceps tenodesis; open distal clavicle excision and AC joint cyst excision;  Surgeon: Tobie Priest, MD;  Location: [facility];  Service: Orthopedics;  Laterality: Left;   SHOULDER ARTHROSCOPY WITH SUBACROMIAL DECOMPRESSION AND OPEN ROTATOR C Left 06/18/2021   Procedure: Left shoulder arthroscopy, Open rotator cuff repair, subacromial decompression, distal clavicle excision and, open biceps tenodesis;  Surgeon: Tobie Priest, MD;  Location: [facility];  Service: Orthopedics;  Laterality: Left;   TONSILLECTOMY     TOTAL KNEE ARTHROPLASTY Right 02/01/2015   Procedure: TOTAL KNEE ARTHROPLASTY;  Surgeon: Helayne Glenn, MD;  Location: [facility];  Service: Orthopedics;  Laterality: Right;   TUBAL LIGATION      Social History   Socioeconomic History   Marital status: Married    Spouse name: Lamar   Number of children: 1   Years of education: Not on file   Highest education level: High school graduate  Occupational History   Not on file  Tobacco Use   Smoking status: Every Day    Current packs/day: 0.00    Average packs/day: 1 pack/day for 30.0 years (30.0 ttl pk-yrs)    Types: Cigarettes    Start date: 09/12/1991    Last attempt to quit: 09/11/2021    Years since quitting: 2.5   Smokeless tobacco: Never  Vaping Use   Vaping status: Never Used  Substance and Sexual Activity   Alcohol use: No   Drug use: Yes    Types: Marijuana    Comment: every day   Sexual activity: Not Currently  Other Topics Concern   Not on file  Social History Narrative   Not on file   Social Drivers of Health   Financial Resource Strain: Low Risk  (11/26/2023)   Received from [facility]   Overall Financial Resource Strain (CARDIA)    Difficulty of Paying Living  Expenses: Not hard at all  Recent Concern: Financial Resource Strain - High Risk (11/14/2023)   Received from [facility]   Overall Financial Resource Strain (CARDIA)    Difficulty of Paying Living Expenses: Very hard  Food Insecurity: No Food Insecurity (11/26/2023)   Received from [facility]   Hunger Vital Sign    Within the past 12 months, you worried that your food would run out before you got the money to buy more.: Never true    Within the past 12 months, the food you bought just didn't last and you didn't have money to get more.: Never true  Recent Concern: Food Insecurity - Food Insecurity Present (11/14/2023)   Received from [facility]   Hunger Vital Sign    Within the past 12 months, you worried that your food would run out before you got the money to buy more.: Sometimes true    Within  the past 12 months, the food you bought just didn't last and you didn't have money to get more.: Sometimes true  Transportation Needs: No Transportation Needs (11/26/2023)   Received from [facility] - Transportation    In the past 12 months, has lack of transportation kept you from medical appointments or from getting medications?: No    Lack of Transportation (Non-Medical): No  Physical Activity: Inactive (09/16/2018)   Exercise Vital Sign    Days of Exercise per Week: 0 days    Minutes of Exercise per Session: 0 min  Stress: Stress Concern Present (09/16/2018)   [employer] - Occupational Stress Questionnaire    Feeling of Stress : To some extent  Social Connections: Unknown (09/16/2018)   Social Connection and Isolation Panel    Frequency of Communication with Friends and Family: Not on file    Frequency of Social Gatherings with Friends and Family: Not on file    Attends Religious Services: Never    Active Member of Clubs or Organizations: No    Attends Banker Meetings:  Never    Marital Status: Separated  Intimate Partner Violence: Not At Risk (09/16/2018)   Humiliation, Afraid, Rape, and Kick questionnaire    Fear of Current or Ex-Partner: No    Emotionally Abused: No    Physically Abused: No    Sexually Abused: No    Family History  Problem Relation Age of Onset   Breast cancer Mother 27   Colon cancer Father    Heart attack Father    Bipolar disorder Sister    Depression Sister    Schizophrenia Sister     No Known Allergies  Review of Systems  Constitutional:  Positive for malaise/fatigue.  Musculoskeletal:  Positive for back pain and joint pain.  All other systems reviewed and are negative.      Objective:   BP 112/70   Pulse 88   Ht 5' 6 (1.676 m)   Wt 217 lb (98.4 kg)   SpO2 95%   BMI 35.02 kg/m   Vitals:   04/06/24 0854  BP: 112/70  Pulse: 88  Height: 5' 6 (1.676 m)  Weight: 217 lb (98.4 kg)  SpO2: 95%  BMI (Calculated): 35.04    Physical Exam Vitals and nursing note reviewed.  Constitutional:      Appearance: Normal appearance. She is normal weight.  HENT:     Head: Normocephalic.  Eyes:     Extraocular Movements: Extraocular movements intact.     Conjunctiva/sclera: Conjunctivae normal.     Pupils: Pupils are equal, round, and reactive to light.  Cardiovascular:     Rate and Rhythm: Normal rate.  Pulmonary:     Effort: Pulmonary effort is normal.  Neurological:     General: No focal deficit present.     Mental Status: She is alert and oriented to person, place, and time. Mental status is at baseline.  Psychiatric:        Mood and Affect: Mood normal.        Behavior: Behavior normal.        Thought Content: Thought content normal.      No results found for any visits on 04/06/24.  No results found for this or any previous visit (from the past 2160 hours).     Assessment & Plan Left wrist pain X-ray ordered today.  Will call with results when available.  Suggested bracing until more  information is  available.   Fibromyalgia Patient stable.  Well controlled with current therapy.   Continue current meds.   Bipolar disorder, in full remission, most recent episode mixed Patient stable.  Well controlled with current therapy.   Continue current meds.   Essential hypertension, benign Blood pressure well controlled with current medications.  Continue current therapy.  Will reassess at follow up.   - CBC w/Diff - CMP w/eGFR  Mixed hyperlipidemia Checking labs today.  Continue current therapy for lipid control. Will modify as needed based on labwork results.   -CMP w/eGFR -Lipid Panel  Prediabetes A1C Continues to be in prediabetic ranges.  Will reassess at follow up after next lab check.  Patient counseled on dietary choices and verbalized understanding.   -CBC w/Diff -CMP w/eGFR -Hemoglobin A1C  Vitamin D  deficiency, unspecified B12 deficiency due to diet Other fatigue Checking labs today.  Will continue supplements as needed.   - Vitamin D  - Vitamin B12 - TSH  Mixed stress and urge urinary incontinence Patient stable.  Well controlled with current therapy.   Continue current meds.   Screening mammogram for breast cancer Mammogram ordered today.  Pt given contact information for [facility] so she can call as needed.   Degeneration of intervertebral disc of lumbar region with discogenic back pain and lower extremity pain Lumbar facet syndrome (Bilateral) (L>R) Spinal stenosis of lumbar region with neurogenic claudication Patient is seen by Neurosurgery, who manage this condition.  She is well controlled with current therapy.   Will defer to them for further changes to plan of care.     No follow-ups on file.   Total time spent: 30 minutes  ALAN CHRISTELLA ARRANT, FNP  04/06/2024   This document [place] have been prepared by [facility] Voice Recognition software and as such K[place]hel include unintentional dictation errors.

## 2024-04-06 NOTE — Assessment & Plan Note (Signed)
 Blood pressure well controlled with current medications.  Continue current therapy.  Will reassess at follow up.   - CBC w/Diff - CMP w/eGFR

## 2024-04-06 NOTE — Assessment & Plan Note (Signed)
 Checking labs today.  Will continue supplements as needed.   - Vitamin D  - Vitamin B12 - TSH

## 2024-04-07 ENCOUNTER — Encounter: Payer: Self-pay | Admitting: Family

## 2024-04-07 LAB — TSH: TSH: 0.506 u[IU]/mL (ref 0.450–4.500)

## 2024-04-07 LAB — CMP14+EGFR
ALT: 14 IU/L (ref 0–32)
AST: 20 IU/L (ref 0–40)
Albumin: 4.5 g/dL (ref 3.8–4.9)
Alkaline Phosphatase: 98 IU/L (ref 49–135)
BUN/Creatinine Ratio: 17 (ref 9–23)
BUN: 18 mg/dL (ref 6–24)
Bilirubin Total: 0.3 mg/dL (ref 0.0–1.2)
CO2: 21 mmol/L (ref 20–29)
Calcium: 9.7 mg/dL (ref 8.7–10.2)
Chloride: 102 mmol/L (ref 96–106)
Creatinine, Ser: 1.09 mg/dL — ABNORMAL HIGH (ref 0.57–1.00)
Globulin, Total: 2.3 g/dL (ref 1.5–4.5)
Glucose: 97 mg/dL (ref 70–99)
Potassium: 4.6 mmol/L (ref 3.5–5.2)
Sodium: 140 mmol/L (ref 134–144)
Total Protein: 6.8 g/dL (ref 6.0–8.5)
eGFR: 60 mL/min/1.73 (ref >59)

## 2024-04-07 LAB — LIPID PANEL
Chol/HDL Ratio: 2.9 ratio (ref 0.0–4.4)
Cholesterol, Total: 135 mg/dL (ref 100–199)
HDL: 46 mg/dL (ref >39)
LDL Chol Calc (NIH): 71 mg/dL (ref 0–99)
Triglycerides: 93 mg/dL (ref 0–149)
VLDL Cholesterol Cal: 18 mg/dL (ref 5–40)

## 2024-04-07 LAB — CBC WITH DIFFERENTIAL/PLATELET
Basophils Absolute: 0.1 x10E3/uL (ref 0.0–0.2)
Basos: 1 %
EOS (ABSOLUTE): 0.2 x10E3/uL (ref 0.0–0.4)
Eos: 2 %
Hematocrit: 45.9 % (ref 34.0–46.6)
Hemoglobin: 14.9 g/dL (ref 11.1–15.9)
Immature Grans (Abs): 0 x10E3/uL (ref 0.0–0.1)
Immature Granulocytes: 0 %
Lymphocytes Absolute: 3.3 x10E3/uL — ABNORMAL HIGH (ref 0.7–3.1)
Lymphs: 40 %
MCH: 31 pg (ref 26.6–33.0)
MCHC: 32.5 g/dL (ref 31.5–35.7)
MCV: 95 fL (ref 79–97)
Monocytes Absolute: 0.6 x10E3/uL (ref 0.1–0.9)
Monocytes: 7 %
Neutrophils Absolute: 4.2 x10E3/uL (ref 1.4–7.0)
Neutrophils: 50 %
Platelets: 275 x10E3/uL (ref 150–450)
RBC: 4.81 x10E6/uL (ref 3.77–5.28)
RDW: 12 % (ref 11.7–15.4)
WBC: 8.3 x10E3/uL (ref 3.4–10.8)

## 2024-04-07 LAB — VITAMIN B12: Vitamin B-12: 463 pg/mL (ref 232–1245)

## 2024-04-07 LAB — HEMOGLOBIN A1C
Est. average glucose Bld gHb Est-mCnc: 120 mg/dL
Hgb A1c MFr Bld: 5.8 % — ABNORMAL HIGH (ref 4.8–5.6)

## 2024-04-07 LAB — VITAMIN D 25 HYDROXY (VIT D DEFICIENCY, FRACTURES): Vit D, 25-Hydroxy: 45.2 ng/mL (ref 30.0–100.0)

## 2024-04-08 ENCOUNTER — Ambulatory Visit: Payer: Self-pay

## 2024-04-09 ENCOUNTER — Other Ambulatory Visit: Payer: Self-pay

## 2024-04-13 ENCOUNTER — Other Ambulatory Visit: Payer: Self-pay | Admitting: Family

## 2024-04-28 ENCOUNTER — Other Ambulatory Visit: Payer: Self-pay | Admitting: Psychiatry

## 2024-04-28 ENCOUNTER — Other Ambulatory Visit: Payer: Self-pay | Admitting: Family

## 2024-04-28 DIAGNOSIS — F410 Panic disorder [episodic paroxysmal anxiety] without agoraphobia: Secondary | ICD-10-CM

## 2024-04-28 DIAGNOSIS — F411 Generalized anxiety disorder: Secondary | ICD-10-CM

## 2024-05-03 ENCOUNTER — Other Ambulatory Visit: Payer: Self-pay | Admitting: Family

## 2024-05-26 ENCOUNTER — Other Ambulatory Visit: Payer: Self-pay | Admitting: Family

## 2024-05-31 ENCOUNTER — Other Ambulatory Visit: Payer: Self-pay | Admitting: Family

## 2024-06-05 ENCOUNTER — Other Ambulatory Visit: Payer: Self-pay | Admitting: Family

## 2024-06-09 ENCOUNTER — Encounter: Payer: Self-pay | Admitting: Family

## 2024-06-14 ENCOUNTER — Encounter: Payer: Self-pay | Admitting: Psychiatry

## 2024-06-14 ENCOUNTER — Telehealth: Admitting: Psychiatry

## 2024-06-14 DIAGNOSIS — F3178 Bipolar disorder, in full remission, most recent episode mixed: Secondary | ICD-10-CM | POA: Diagnosis not present

## 2024-06-14 DIAGNOSIS — F1220 Cannabis dependence, uncomplicated: Secondary | ICD-10-CM | POA: Diagnosis not present

## 2024-06-14 DIAGNOSIS — F17200 Nicotine dependence, unspecified, uncomplicated: Secondary | ICD-10-CM | POA: Diagnosis not present

## 2024-06-14 DIAGNOSIS — Z634 Disappearance and death of family member: Secondary | ICD-10-CM

## 2024-06-14 DIAGNOSIS — F411 Generalized anxiety disorder: Secondary | ICD-10-CM

## 2024-06-14 DIAGNOSIS — G4701 Insomnia due to medical condition: Secondary | ICD-10-CM | POA: Diagnosis not present

## 2024-06-14 DIAGNOSIS — F410 Panic disorder [episodic paroxysmal anxiety] without agoraphobia: Secondary | ICD-10-CM

## 2024-06-14 NOTE — Progress Notes (Signed)
 Virtual Visit via Video Note  I connected with Shelly Wright on 06/14/24 at  8:30 AM EST by a video enabled telemedicine application and verified that I am speaking with the correct person using two identifiers.  Location Provider Location : [facility] Patient Location : [facility]  Participants: Patient , Provider    I discussed the limitations of evaluation and management by telemedicine and the availability of in person appointments. The patient expressed understanding and agreed to proceed.   I discussed the assessment and treatment plan with the patient. The patient was provided an opportunity to ask questions and all were answered. The patient agreed with the plan and demonstrated an understanding of the instructions.   The patient was advised to call back or seek an in-person evaluation if the symptoms worsen or if the condition fails to improve as anticipated.   BH MD OP Progress Note  06/14/2024 8:57 AM Shelly Wright  MRN:  [id number]  Chief Complaint:  Chief Complaint  Patient presents with   Follow-up   Medication Refill   Depression   HPI: Shelly Wright is a 54 year old Caucasian female on SSRI, has a history of bipolar disorder, GAD, panic disorder, cannabis use disorder, tobacco use disorder, married, lives in [place] was evaluated by telemedicine today.  Since the last visit, she report stable mood without significant mood swings, depression, anxiety, or irritability.  She reports that sleep remains restful, with bedtime typically between 8 and 10 PM and feeling rested upon waking.  She continue to take Seroquel  50 mg and trazodone  200 mg together at bedtime, as well as venlafaxine  225 mg daily and Buspar  30 mg twice daily.  She report that appetite remains unchanged, and she is not following a diet plan.  Previously used to be on Wegovy  however no longer taking it due to health insurance problem.  Planning to get back on it soon.  Currently struggles with pain, chronic  mostly of the back.  This limits her ability to exercise or stay active.  Currently using gabapentin , tramadol  for pain management.  However report gabapentin  as causing side effects of memory changes and is planning to get off of it soon.  She denies any suicidality, homicidality or perceptual disturbances.  Continues to smoke cigarettes and use Cannabis on a daily basis and not interested in quitting.  Visit Diagnosis:    ICD-10-CM   1. Bipolar disorder, in full remission, most recent episode mixed  F31.78     2. GAD (generalized anxiety disorder)  F41.1     3. Panic disorder  F41.0     4. Insomnia due to medical condition  G47.01    Multifactorial including pain, mood symptoms    5. Bereavement  Z63.4     6. Cannabis use disorder, moderate, dependence (HCC)  F12.20     7. Tobacco use disorder  F17.200    Severe      Past Psychiatric History: Reviewed past psychiatric history from progress note on 09/16/2018.  Past trials of venlafaxine , trazodone , Seroquel , hydroxyzine , Zoloft, Lexapro, duloxetine, Paxil, Abilify , Wellbutrin.  Past Medical History:  Past Medical History:  Diagnosis Date   Abnormal EKG    Acute right-sided low back pain with right-sided sciatica 08/30/2014   Anxiety    Arthritis    Asthma    in past, no current inhalers   Bipolar disorder (HCC)    Breathlessness on exertion 04/20/2014   Cannabis use disorder    Cervical spinal cord compression (HCC) 05/07/2012  Chronic pain syndrome    COPD (chronic obstructive pulmonary disease) (HCC)    DDD (degenerative disc disease), lumbar    Depression    DOE (dyspnea on exertion)    Dysrhythmia    irregular-h/o in the past   Extreme obesity    Fibromyalgia    H/O syncope 04/25/2014   High risk medication use 06/23/2019   Hyperlipidemia    Hypertension    Hypokalemia    Hyponatremia 05/07/2021   Long term current use of anticoagulant therapy (Lovenox ) 05/06/2016   Lumbar canal stenosis    Marijuana  smoker    Neuritis or radiculitis due to rupture of lumbar intervertebral disc 05/23/2014   Noncompliance with treatment regimen 05/01/2020   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   a.) presurgical PCR (+) 02/02/2019 prior to LEFT TKA; b.) presurgical PCR (+) 03/06/2019 prior to RIGHT REVERSE SHOULDER/BICEPS TENODESIS; c.) presurgical PCR (+) 01/17/2022 prior to LEFT SHOULDER ARTHROSCOPY/OPEN ROTATOR CUFF REPAIR/SUBACROMIAL DECOMPRESSION/DISTAL CLAVICLE EXCISION/OPEN BICEPS TENODESIS   Partner relational problem 10/05/2021   Periprosthetic fracture around prosthetic knee 07/31/2015   Personal history of perinatal problems 04/25/2014   Pharmacologic therapy 11/30/2019   Pre-diabetes    Restless leg syndrome    Soft tissue lesion of shoulder region 01/24/2012   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   Syncope and collapse 04/08/2014   last was 2023   Thoracic and lumbosacral neuritis 05/07/2012   Thoracic neuritis 05/07/2012   Tobacco use disorder    Vitamin D  deficiency     Past Surgical History:  Procedure Laterality Date   ABDOMINAL HYSTERECTOMY     BICEPT TENODESIS Left 03/04/2023   Procedure: Left reverse shoulder arthroplasty, biceps tenodesis; open distal clavicle excision and AC joint cyst excision;  Surgeon: Tobie Priest, MD;  Location: [facility];  Service: Orthopedics;  Laterality: Left;   Cervical Fusion X 2     JOINT REPLACEMENT Right 01/2016   Dr Maryl   KNEE ARTHROPLASTY Left 02/10/2019   Procedure: COMPUTER ASSISTED TOTAL KNEE ARTHROPLASTY - RNFA;  Surgeon: Mardee Lynwood SQUIBB, MD;  Location: [facility];  Service: Orthopedics;  Laterality: Left;   KNEE ARTHROSCOPY Bilateral    Right knee scope 1998, Left knee scope   RESECTION DISTAL CLAVICAL Left 03/04/2023   Procedure: Left reverse shoulder arthroplasty, biceps tenodesis; open distal clavicle excision and AC joint cyst excision;  Surgeon: Tobie Priest, MD;  Location: [facility];  Service: Orthopedics;  Laterality: Left;   REVERSE SHOULDER ARTHROPLASTY Right 03/12/2021   Procedure: Right reverse shoulder arthroplasty, biceps tenodesis;  Surgeon: Tobie Priest, MD;  Location: [facility];  Service: Orthopedics;  Laterality: Right;   REVERSE SHOULDER ARTHROPLASTY Left 03/04/2023   Procedure: Left reverse shoulder arthroplasty, biceps tenodesis; open distal clavicle excision and AC joint cyst excision;  Surgeon: Tobie Priest, MD;  Location: [facility];  Service: Orthopedics;  Laterality: Left;   SHOULDER ARTHROSCOPY WITH SUBACROMIAL DECOMPRESSION AND OPEN ROTATOR C Left 06/18/2021   Procedure: Left shoulder arthroscopy, Open rotator cuff repair, subacromial decompression, distal clavicle excision and, open biceps tenodesis;  Surgeon: Tobie Priest, MD;  Location: [facility];  Service: Orthopedics;  Laterality: Left;   TONSILLECTOMY     TOTAL KNEE ARTHROPLASTY Right 02/01/2015   Procedure: TOTAL KNEE ARTHROPLASTY;  Surgeon: Helayne Maryl, MD;  Location: [facility];  Service: Orthopedics;  Laterality: Right;   TUBAL LIGATION      Family Psychiatric History: I have reviewed family psychiatric history  from progress note on 09/16/2018.  Family History:  Family History  Problem Relation Age of Onset   Breast cancer Mother 80   Colon cancer Father    Heart attack Father    Bipolar disorder Sister    Depression Sister    Schizophrenia Sister     Social History: I have reviewed social history from progress note on 09/16/2018. Social History   Socioeconomic History   Marital status: Married    Spouse name: Lamar   Number of children: 1   Years of education: Not on file   Highest education level: High school graduate  Occupational History   Not on file  Tobacco Use   Smoking status: Former    Current packs/day: 0.00    Average packs/day: 1 pack/day for 30.0 years (30.0 ttl pk-yrs)    Types: Cigarettes    Start date: 09/12/1991    Quit date:  09/11/2021    Years since quitting: 2.7   Smokeless tobacco: Never  Vaping Use   Vaping status: Never Used  Substance and Sexual Activity   Alcohol use: No   Drug use: Yes    Types: Marijuana    Comment: every day   Sexual activity: Not Currently  Other Topics Concern   Not on file  Social History Narrative   Not on file   Social Drivers of Health   Tobacco Use: Medium Risk (06/14/2024)   Patient History    Smoking Tobacco Use: Former    Smokeless Tobacco Use: Never    Passive Exposure: Not on Actuary Strain: Low Risk  (11/26/2023)   Received from [facility]   Overall Financial Resource Strain (CARDIA)    Difficulty of Paying Living Expenses: Not hard at all  Recent Concern: Financial Resource Strain - High Risk (11/14/2023)   Received from [facility]   Overall Financial Resource Strain (CARDIA)    Difficulty of Paying Living Expenses: Very hard  Food Insecurity: No Food Insecurity (11/26/2023)   Received from [facility]   Epic    Within the past 12 months, you worried that your food would run out before you got the money to buy more.: Never true    Within the past 12 months, the food you bought just didn't last and you didn't have money to get more.: Never true  Recent Concern: Food Insecurity - Food Insecurity Present (11/14/2023)   Received from [facility]   Epic    Within the past 12 months, you worried that your food would run out before you got the money to buy more.: Sometimes true    Within the past 12 months, the food you bought just didn't last and you didn't have money to get more.: Sometimes true  Transportation Needs: No Transportation Needs (11/26/2023)   Received from [facility] - Transportation    In the past 12 months, has lack of transportation kept you from medical appointments or from getting medications?: No    Lack of Transportation  (Non-Medical): No  Physical Activity: Not on file  Stress: Not on file  Social Connections: Not on file  Depression (PHQ2-9): Low Risk (11/19/2023)   Depression (PHQ2-9)    PHQ-2 Score: 1  Alcohol Screen: Not on file  Housing: Low Risk  (11/26/2023)   Received from [facility]   Epic    In the last 12 months, was there a time when you  were not able to pay the mortgage or rent on time?: No    In the past 12 months, how many times have you moved where you were living?: 0    At any time in the past 12 months, were you homeless or living in a shelter (including now)?: No  Utilities: Not At Risk (11/26/2023)   Received from [facility]   Epic    In the past 12 months has the electric, gas, oil, or water  company threatened to shut off services in your home?: No  Health Literacy: Not on file    Allergies: Allergies[1]  Metabolic Disorder Labs: Lab Results  Component Value Date   HGBA1C 5.8 (H) 04/06/2024   MPG 128.37 02/19/2023   MPG 131.24 09/26/2020   Lab Results  Component Value Date   PROLACTIN 6.4 09/26/2020   Lab Results  Component Value Date   CHOL 135 04/06/2024   TRIG 93 04/06/2024   HDL 46 04/06/2024   CHOLHDL 2.9 04/06/2024   VLDL 20 09/26/2020   LDLCALC 71 04/06/2024   LDLCALC 70 12/05/2023   Lab Results  Component Value Date   TSH 0.506 04/06/2024   TSH 0.590 12/05/2023    Therapeutic Level Labs: No results found for: LITHIUM No results found for: VALPROATE No results found for: CBMZ  Current Medications: Current Outpatient Medications  Medication Sig Dispense Refill   acetaminophen  (TYLENOL ) 500 MG tablet Take 1,000 mg by mouth daily.     amLODipine -benazepril  (LOTREL) 5-20 MG capsule TAKE 1 CAPSULE BY MOUTH ONCE DAILY 90 capsule 1   busPIRone  (BUSPAR ) 30 MG tablet Take 1 tablet (30 mg total) by mouth 2 (two) times daily. 180 tablet 1   Crisaborole  (EUCRISA ) 2 % OINT Apply topically to affected areas 60 g 11    cyclobenzaprine  (FLEXERIL ) 10 MG tablet TAKE ONE TABLET BY MOUTH 3 TIMES DAILY AS NEEDED FOR MUSCLE SPASMS 90 tablet 0   gabapentin  (NEURONTIN ) 800 MG tablet TAKE 1 TABLET BY MOUTH THREE TIMES DAILY 90 tablet 5   GEMTESA  75 MG TABS TAKE ONE TABLET BY MOUTH ONCE DAILY 30 tablet 5   lidocaine  (XYLOCAINE ) 5 % ointment Apply 1 Application topically as needed for moderate pain or mild pain. 35.44 g 5   loratadine  (CLARITIN ) 10 MG tablet Take 1 tablet (10 mg total) by mouth daily as needed for allergies. 90 tablet 3   Melatonin 5 MG CAPS Take 1 capsule (5 mg total) by mouth at bedtime. (Patient taking differently: Take 10 mg by mouth at bedtime.)     meloxicam  (MOBIC ) 15 MG tablet TAKE ONE TABLET BY MOUTH ONCE DAILY AS NEEDED 90 tablet 1   QUEtiapine  (SEROQUEL ) 50 MG tablet Take 1 tablet (50 mg total) by mouth at bedtime. Stop 25 mg 90 tablet 1   simvastatin  (ZOCOR ) 20 MG tablet TAKE ONE TABLET BY MOUTH AT BEDTIME 90 tablet 1   traMADol  (ULTRAM ) 50 MG tablet TAKE ONE TABLET BY MOUTH EVERY 6 HOURS AS NEEDED 120 tablet 01   traZODone  (DESYREL ) 100 MG tablet TAKE 2 TABLETS BY MOUTH AT BEDTIME AS NEEDED FOR SLEEP 180 tablet 1   venlafaxine  XR (EFFEXOR -XR) 150 MG 24 hr capsule TAKE 1 CAPSULE BY MOUTH ONCE DAILY WITH BREAKFAST TAKE ALONG WITH 75 MG 90 capsule 3   venlafaxine  XR (EFFEXOR -XR) 75 MG 24 hr capsule TAKE 1 CAPSULE BY MOUTH ONCE DAILY WITH BREAKFAST ALONG WITH 150 MG 90 capsule 1   VENTOLIN  HFA 108 (90 Base) MCG/ACT inhaler  INHALE 2 PUFFS INTO LUNGS EVERY 6 HOURS AS NEEDED FOR WHEEZING OR SHORTNESS OF BREATH 18 g 5   No current facility-administered medications for this visit.     Musculoskeletal: Strength & Muscle Tone: UTA Gait & Station: Seated Patient leans: N/A  Psychiatric Specialty Exam: Review of Systems  Psychiatric/Behavioral: Negative.      There were no vitals taken for this visit.There is no height or weight on file to calculate BMI.  General Appearance: Casual  Eye Contact:   Fair  Speech:  Clear and Coherent  Volume:  Normal  Mood:  Euthymic  Affect:  Congruent  Thought Process:  Goal Directed and Descriptions of Associations: Intact  Orientation:  Full (Time, Place, and Person)  Thought Content: Logical   Suicidal Thoughts:  No  Homicidal Thoughts:  No  Memory:  Immediate;   Fair Recent;   Fair Remote;   Fair  Judgement:  Fair  Insight:  Fair  Psychomotor Activity:  Normal  Concentration:  Concentration: Fair and Attention Span: Fair  Recall:  [employer] of Knowledge: Fair  Language: Fair  Akathisia:  No  Handed:  Right  AIMS (if indicated): not done  Assets:  Communication Skills Desire for Improvement Housing Social Support  ADL's:  Intact  Cognition: WNL  Sleep:  Fair   Screenings: Midwife Visit from 02/19/2024 in [facility] Visit from 11/19/2023 in [facility] Visit from 01/07/2022 in [facility] Office Visit from 10/05/2021 in [facility] Office Visit from 11/27/2020 in [facility]  AIMS Total Score 0 0 0 0 0   GAD-7    Flowsheet Row Office Visit from 11/19/2023 in [facility] Office Visit from 03/10/2023 in [facility] Office Visit from 09/10/2022 in [facility] Office Visit from 01/07/2022 in [facility] Office Visit from 08/14/2021 in [facility]  Total GAD-7 Score 3 10 2 4  0   PHQ2-9    Flowsheet Row Office Visit from 11/19/2023 in [facility] Office Visit from 03/10/2023 in [facility] Office Visit from 09/10/2022 in [facility] Office Visit from 01/07/2022 in [facility] Office Visit from 10/05/2021 in [facility]  PHQ-2 Total Score 1 3 0 1 2  PHQ-9 Total Score -- 7 2 -- 4   Flowsheet Row Video Visit from 06/14/2024 in [facility] Video Visit from 04/06/2024 in [facility] Office Visit from 02/19/2024 in [facility]  C-SSRS RISK CATEGORY No Risk No Risk No Risk     Assessment and Plan: Shelly Wright is a 54 year old Caucasian female who presented for a follow-up appointment.  Discussed assessment and plan as noted below.  1. Bipolar disorder, in full remission, most recent episode mixed Currently denies any significant mood swings Continue Venlafaxine  extended release 225 mg daily Continue BuSpar  30 mg twice daily Continue Seroquel  50 mg at bedtime  2. GAD (generalized anxiety disorder)-stable Denies any concerns Continue BuSpar  as prescribed Continue Venlafaxine  extended release 225 mg daily  3. Panic disorder-stable Currently denies any panic attacks Continue Venlafaxine  and BuSpar  as prescribed  4. Insomnia due to medical condition-stable Denies any significant sleep problems Continue Trazodone  200 mg at bedtime as needed Continue Melatonin low dosage as needed Continue sleep hygiene techniques. Will need sufficient pain management.  5. Bereavement-stable Denies any concerns today.  6. Cannabis use disorder, moderate, dependence (HCC) Not interested in quitting.  Attempted counseling.  7. Tobacco use disorder Not interested in quitting.  Attempted counseling.  Follow-up Follow-up in clinic in 3 to 4 months or sooner in person.  Discussed the use of a AI scribe software for clinical note transcription with the patient, who gave verbal consent to  proceed. Consent: Patient/Guardian gives verbal consent for treatment and assignment of benefits for services provided during this visit. Patient/Guardian expressed understanding and agreed to proceed.  This note was generated in part or whole with voice recognition software. Voice recognition is usually quite accurate but there are transcription errors that can and very often do occur. I apologize for any typographical errors that were not detected and corrected.     Berklie Dethlefs, MD 06/14/2024, 8:57 AM     [1] No Known Allergies

## 2024-06-16 ENCOUNTER — Other Ambulatory Visit: Payer: Self-pay | Admitting: Family

## 2024-06-22 ENCOUNTER — Other Ambulatory Visit: Payer: Self-pay | Admitting: Family

## 2024-06-25 ENCOUNTER — Other Ambulatory Visit: Payer: Self-pay

## 2024-06-25 ENCOUNTER — Telehealth: Payer: Self-pay

## 2024-06-25 MED ORDER — WEGOVY 0.25 MG/0.5ML ~~LOC~~ SOAJ: 0.2500 mg | SUBCUTANEOUS | 0 refills | Status: AC

## 2024-06-25 NOTE — Telephone Encounter (Signed)
 Pt called asking if her dog could be her service animal per our policy I let her know we can not fill form out her PCP would have to pt verbalized understanding.

## 2024-07-07 ENCOUNTER — Ambulatory Visit: Admitting: Family

## 2024-08-04 ENCOUNTER — Ambulatory Visit: Admitting: Family

## 2024-10-14 ENCOUNTER — Ambulatory Visit: Admitting: Psychiatry
